# Patient Record
Sex: Female | Born: 1935
Health system: Southern US, Community
[De-identification: ages and names within clinical notes are randomized; demographics above are authoritative.]

## PROBLEM LIST (undated history)

## (undated) ENCOUNTER — Emergency Department (HOSPITAL_COMMUNITY): Discharge status: Absent without leave | Payer: Medicare HMO

## (undated) DIAGNOSIS — I2729 Other secondary pulmonary hypertension: Secondary | ICD-10-CM

## (undated) DIAGNOSIS — R1310 Dysphagia, unspecified: Secondary | ICD-10-CM

## (undated) DIAGNOSIS — I4891 Unspecified atrial fibrillation: Secondary | ICD-10-CM

## (undated) DIAGNOSIS — F329 Major depressive disorder, single episode, unspecified: Secondary | ICD-10-CM

## (undated) DIAGNOSIS — I2699 Other pulmonary embolism without acute cor pulmonale: Secondary | ICD-10-CM

## (undated) DIAGNOSIS — K228 Other specified diseases of esophagus: Secondary | ICD-10-CM

## (undated) DIAGNOSIS — Z9889 Other specified postprocedural states: Secondary | ICD-10-CM

## (undated) DIAGNOSIS — I4819 Other persistent atrial fibrillation: Secondary | ICD-10-CM

## (undated) DIAGNOSIS — G43909 Migraine, unspecified, not intractable, without status migrainosus: Secondary | ICD-10-CM

## (undated) DIAGNOSIS — I639 Cerebral infarction, unspecified: Secondary | ICD-10-CM

## (undated) DIAGNOSIS — I5033 Acute on chronic diastolic (congestive) heart failure: Secondary | ICD-10-CM

## (undated) DIAGNOSIS — N183 Chronic kidney disease, stage 3 (moderate): Secondary | ICD-10-CM

## (undated) DIAGNOSIS — F419 Anxiety disorder, unspecified: Secondary | ICD-10-CM

## (undated) DIAGNOSIS — Z5181 Encounter for therapeutic drug level monitoring: Secondary | ICD-10-CM

## (undated) DIAGNOSIS — E785 Hyperlipidemia, unspecified: Secondary | ICD-10-CM

## (undated) DIAGNOSIS — G40909 Epilepsy, unspecified, not intractable, without status epilepticus: Secondary | ICD-10-CM

## (undated) DIAGNOSIS — Z950 Presence of cardiac pacemaker: Secondary | ICD-10-CM

## (undated) DIAGNOSIS — Z8719 Personal history of other diseases of the digestive system: Secondary | ICD-10-CM

## (undated) DIAGNOSIS — R112 Nausea with vomiting, unspecified: Secondary | ICD-10-CM

## (undated) DIAGNOSIS — R011 Cardiac murmur, unspecified: Secondary | ICD-10-CM

## (undated) DIAGNOSIS — R911 Solitary pulmonary nodule: Secondary | ICD-10-CM

## (undated) DIAGNOSIS — J439 Emphysema, unspecified: Secondary | ICD-10-CM

## (undated) DIAGNOSIS — K219 Gastro-esophageal reflux disease without esophagitis: Secondary | ICD-10-CM

## (undated) DIAGNOSIS — I10 Essential (primary) hypertension: Secondary | ICD-10-CM

## (undated) DIAGNOSIS — F32A Depression, unspecified: Secondary | ICD-10-CM

## (undated) DIAGNOSIS — I482 Chronic atrial fibrillation, unspecified: Secondary | ICD-10-CM

## (undated) DIAGNOSIS — Z7901 Long term (current) use of anticoagulants: Secondary | ICD-10-CM

## (undated) DIAGNOSIS — G40409 Other generalized epilepsy and epileptic syndromes, not intractable, without status epilepticus: Secondary | ICD-10-CM

## (undated) HISTORY — DX: Emphysema, unspecified: J43.9

## (undated) HISTORY — DX: Other secondary pulmonary hypertension: I27.29

## (undated) HISTORY — DX: Essential (primary) hypertension: I10

## (undated) HISTORY — DX: Other persistent atrial fibrillation: I48.19

## (undated) HISTORY — DX: Cerebral infarction, unspecified: I63.9

## (undated) HISTORY — DX: Chronic atrial fibrillation, unspecified: I48.20

## (undated) HISTORY — DX: Solitary pulmonary nodule: R91.1

## (undated) HISTORY — DX: Epilepsy, unspecified, not intractable, without status epilepticus: G40.909

## (undated) HISTORY — DX: Major depressive disorder, single episode, unspecified: F32.9

## (undated) HISTORY — DX: Acute on chronic diastolic (congestive) heart failure: I50.33

## (undated) HISTORY — DX: Long term (current) use of anticoagulants: Z79.01

## (undated) HISTORY — DX: Anxiety disorder, unspecified: F41.9

## (undated) HISTORY — DX: Encounter for therapeutic drug level monitoring: Z51.81

## (undated) HISTORY — PX: ABDOMINAL HYSTERECTOMY: SHX81

## (undated) HISTORY — DX: Depression, unspecified: F32.A

## (undated) HISTORY — DX: Cardiac murmur, unspecified: R01.1

## (undated) HISTORY — PX: AUGMENTATION MAMMAPLASTY: SUR837

## (undated) HISTORY — PX: TUMOR EXCISION: SHX421

## (undated) HISTORY — DX: Hyperlipidemia, unspecified: E78.5

## (undated) HISTORY — DX: Presence of cardiac pacemaker: Z95.0

---

## 2010-08-08 ENCOUNTER — Ambulatory Visit: Payer: Self-pay | Admitting: Cardiology

## 2011-05-13 DIAGNOSIS — R072 Precordial pain: Secondary | ICD-10-CM

## 2011-09-03 ENCOUNTER — Encounter (INDEPENDENT_AMBULATORY_CARE_PROVIDER_SITE_OTHER): Payer: Self-pay | Admitting: *Deleted

## 2011-09-04 ENCOUNTER — Encounter (INDEPENDENT_AMBULATORY_CARE_PROVIDER_SITE_OTHER): Payer: Self-pay | Admitting: *Deleted

## 2011-09-22 ENCOUNTER — Encounter (INDEPENDENT_AMBULATORY_CARE_PROVIDER_SITE_OTHER): Payer: Self-pay | Admitting: Internal Medicine

## 2011-09-22 ENCOUNTER — Ambulatory Visit (INDEPENDENT_AMBULATORY_CARE_PROVIDER_SITE_OTHER): Payer: Medicare PPO | Admitting: Internal Medicine

## 2011-09-22 DIAGNOSIS — R569 Unspecified convulsions: Secondary | ICD-10-CM | POA: Insufficient documentation

## 2011-09-22 DIAGNOSIS — R109 Unspecified abdominal pain: Secondary | ICD-10-CM

## 2011-09-22 DIAGNOSIS — I10 Essential (primary) hypertension: Secondary | ICD-10-CM

## 2011-09-22 NOTE — Progress Notes (Signed)
Subjective:     Patient ID: Karen Richardson, female   DOB: [birth year], 77 y.o.   MRN: [id number]  HPI Karen Richardson is a 77 yr old female referred to our office by Dr. Sherril Croon for abdominal pain. She saw Dr. Sherril Croon 4-6 month ago for  upper abdominal pain. It felt like a knot.  She could not rest or sit up without hurting.  She was started on omeprazole which did not help.  She says they prayed at church for her and the pain resolved x 2 month.  She underwent a CT abdomen and pelvis in September  Which revealed rt upper quadrant inflammatory process most likely due to duodenitis/peptic ulcer disease. Appetite is good. No weight loss.  No abdominal pain. She has one a day. No melena or rectal bleeding.  Colonoscopy over 20 yrs ago which was incomplete Dr. Cleotis Nipper.  Patient has refused a colonoscopy today.  9/20/12012 CT abdomen and pelvis with CM for abdominal pain revealed Rt upper quadrant inflammatory process most likely due to duodenitis/peptic ulcer disease. 08/09/2011 US abdomen complete: Negative abdominal US  08/12/11 Glucose 111, BU\N 15, Creatinine 1.09, Total protein 6.9, ALP 78, AST 38, Total protein 0.4, ALT 30, K 4.6 NA 139, Amylase 40, Lipase 40.  H and H 13.1 and 36.4, MCV 92.5  Review of Systems  See hpi Current Outpatient Prescriptions  Medication Sig Dispense Refill  . carbamazepine (TEGRETOL XR) 200 MG 12 hr tablet Take 200 mg by mouth 2 (two) times daily.        . metoprolol (TOPROL-XL) 50 MG 24 hr tablet Take 50 mg by mouth daily.         Past Surgical History  Procedure Date  . Breast enhancement surgery     40 yrs ago  . Partial hysterectomy   . 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 Mother Deceased     Tumor in colon.  PE  . Father Deceased     CVA, COPD  . Sister Other     One had pancreatic cancer, one had a Brain tumor, one had epilepsy, ,   . Brother Deceased     One deceased from [facility], One drank heavy and is deceased.  .  Child Alive     all in good health   History   Social History Narrative  . No narrative on file   History   Social History  . Marital Status: Married    Spouse Name: N/A    Number of Children: N/A  . Years of Education: N/A   Occupational History  . Not on file.   Social History Main Topics  . Smoking status: Never Smoker   . Smokeless tobacco: Not on file  . Alcohol Use: No  . Drug Use: No  . Sexually Active: Not on file   Other Topics Concern  . Not on file   Social History Narrative  . No narrative on file   Allergies  Allergen Reactions  . Codeine         Objective:   Physical Exam  Filed Vitals:   09/22/11 1015  BP: 190/84  Pulse: 66  Temp: 97.8 F (36.6 C)  Height: 5' 5.5" (1.664 m)  Weight: 164 lb 11.2 oz (74.707 kg)    Alert and oriented. Skin warm and dry. Oral mucosa is moist. Natural teeth and partial plate in good condition. Sclera anicteric, conjunctivae is  pink. Thyroid not enlarged. No cervical lymphadenopathy. Lungs clear. Heart regular rate and rhythm.  Abdomen is soft. Bowel sounds are positive. No hepatomegaly. No abdominal masses felt. No tenderness.  No edema to lower extremities. Patient is alert and oriented.      Assessment:    Abnormal CT.  Upper abdominal pain resolved at this time.    Plan:       I discussed this case with Dr. Karilyn Cota. Patient is asymptomatic.  Will get an H. Pylori on her today. If her pain reoccurs he will proceed with an EGD.  Patient is satisfied with here care she received today.

## 2011-09-22 NOTE — Patient Instructions (Signed)
H. Pylori today.  If pain return Dr. Karilyn Cota will proceed with an EGD. I discussed this case with Dr. Karilyn Cota.

## 2011-09-23 LAB — H. PYLORI ANTIBODY, IGG: H Pylori IgG: 5.23 {ISR} — ABNORMAL HIGH

## 2011-09-24 ENCOUNTER — Telehealth (INDEPENDENT_AMBULATORY_CARE_PROVIDER_SITE_OTHER): Payer: Self-pay | Admitting: Internal Medicine

## 2011-09-24 NOTE — Telephone Encounter (Signed)
Rx for Prilosec, Amoxicillin and Clarithromycin x 14 days called to [facility].

## 2012-05-06 ENCOUNTER — Encounter (INDEPENDENT_AMBULATORY_CARE_PROVIDER_SITE_OTHER): Payer: Self-pay

## 2016-01-09 ENCOUNTER — Encounter: Payer: Self-pay | Admitting: Orthopaedic Surgery

## 2016-01-09 ENCOUNTER — Ambulatory Visit (INDEPENDENT_AMBULATORY_CARE_PROVIDER_SITE_OTHER): Payer: Medicare Other | Admitting: Orthopaedic Surgery

## 2016-01-09 VITALS — BP 173/86 | HR 58 | Temp 97.5°F | Ht 65.0 in | Wt 167.2 lb

## 2016-01-09 DIAGNOSIS — M25562 Pain in left knee: Secondary | ICD-10-CM | POA: Diagnosis not present

## 2016-01-09 DIAGNOSIS — M25561 Pain in right knee: Secondary | ICD-10-CM | POA: Diagnosis not present

## 2016-01-11 DIAGNOSIS — M25562 Pain in left knee: Principal | ICD-10-CM

## 2016-01-11 DIAGNOSIS — M25561 Pain in right knee: Secondary | ICD-10-CM | POA: Insufficient documentation

## 2016-01-11 NOTE — Patient Instructions (Signed)
Continue Naprosyn Call if any problems.

## 2016-01-11 NOTE — Progress Notes (Signed)
Patient Karen Richardson, female DOB:02/08/1934, 82 y.o. TVN:[id number]  Chief Complaint  Patient presents with  . Knee Pain    bilateral knee pain     HPI  Karen Richardson is a 82 y.o. female  With bilateral knee pains, more on the left than the right.  She has swelling and popppng but no giving way.  Knee Pain  The pain is present in the left knee and right knee. The quality of the pain is described as aching. The pain is at a severity of 3/10. The pain is mild. The pain has been fluctuating since onset. The symptoms are aggravated by weight bearing. She has tried acetaminophen, heat, ice, NSAIDs and rest for the symptoms. The treatment provided moderate relief.    Body mass index is 27.82 kg/(m^2).  Review of Systems  Patient does not have Diabetes Mellitus. Patient has hypertension. Patient does not have COPD or shortness of breath. Patient does not have BMI > 35. Patient does not have current smoking history.  Review of Systems  Cardiovascular:       Hypertension  Gastrointestinal:       GERD  Musculoskeletal: Positive for joint swelling.  Neurological:       History of CVA no residual     Past Medical History  Diagnosis Date  . Seizures (HCC)   . Hypertension     x 4 months    Past Surgical History  Procedure Laterality Date  . Breast enhancement surgery      40 yrs ago  . Partial hysterectomy    . Tumor removed from spinal cord      benign    History reviewed. No pertinent family history.  Social History Social History  Substance Use Topics  . Smoking status: Never Smoker   . Smokeless tobacco: None  . Alcohol Use: No    Allergies  Allergen Reactions  . Codeine     Current Outpatient Prescriptions  Medication Sig Dispense Refill  . amLODipine (NORVASC) 5 MG tablet Take 5 mg by mouth daily.    Marland Kitchen atorvastatin (LIPITOR) 10 MG tablet Take 10 mg by mouth daily.    . carbamazepine (TEGRETOL XR) 200 MG 12 hr tablet Take 200 mg by mouth 2 (two) times  daily.      . metoprolol (TOPROL-XL) 50 MG 24 hr tablet Take 50 mg by mouth daily.      . naproxen (NAPROSYN) 500 MG tablet Take 500 mg by mouth 2 (two) times daily with a meal.     No current facility-administered medications for this visit.     Physical Exam  Blood pressure 173/86, pulse 58, temperature 97.5 F (36.4 C), height 5\' 5"  (1.651 m), weight 167 lb 3.2 oz (75.841 kg).  Constitutional: overall normal hygiene, normal nutrition, well developed, normal grooming, normal body habitus. Assistive device:none  Musculoskeletal: gait and station Limp left, muscle tone and strength are normal, no tremors or atrophy is present.  .  Neurological: coordination overall normal.  Deep tendon reflex/nerve stretch intact.  Sensation normal.  Cranial nerves II-XII intact.   Skin:normal overall no scars, lesions, ulcers or rash es. No psoriasis.  Psychiatric: Alert and oriented x 3.  Recent memory intact, remote memory unclear.  Normal mood and affect. Well groomed.  Good eye contact.  Cardiovascular: overall no swelling, no varicosities, no edema bilaterally, normal temperatures of the legs and arms, no clubbing, cyanosis and good capillary refill.  Lymphatic: palpation is normal.   Extremities:Both knees are  tender and she has bilateral effusions of the knees, much more on the left.  Both knees are stable Inspection Her thighs and feet are normal.  Knees with effusion Strength and tone normal Range of motion 0 to 100 on left with crepitus, 0 to 105 on the right.  Additional services performed: I reviewed her exercises and activity levels.  She is encouraged to be as active as she can be.   Continue her Naprosyn  PLAN Call if any problems.  Precautions discussed.  Continue current medications.   Return to clinic 4-6 wks

## 2016-02-06 ENCOUNTER — Ambulatory Visit (INDEPENDENT_AMBULATORY_CARE_PROVIDER_SITE_OTHER): Payer: Medicare Other | Admitting: Orthopaedic Surgery

## 2016-02-06 VITALS — BP 170/88 | HR 63 | Temp 97.7°F | Ht 65.0 in | Wt 167.2 lb

## 2016-02-06 DIAGNOSIS — M25561 Pain in right knee: Secondary | ICD-10-CM

## 2016-02-06 DIAGNOSIS — M25562 Pain in left knee: Secondary | ICD-10-CM | POA: Diagnosis not present

## 2016-02-06 NOTE — Progress Notes (Signed)
Patient Karen Richardson, female DOB:July 01, 1934, 82 y.o. IPJ:[id number]  Chief Complaint  Patient presents with  . Follow-up    Bilateral knee pain s/p injection "feel better since shot"    HPI  Karen Richardson is a 82 y.o. female who has had bilateral knee pain.  I did injections last time and she is significantly improved.  She has no pain now.  She has been walker and has no new problem.  She has no giving way, no locking  She has little swelling now.  She still has some popping of the knees.  She is very pleased she is better.  HPI  Body mass index is 27.82 kg/(m^2).   Review of Systems  HENT: Negative.   Eyes: Negative.   Cardiovascular:       Hypertension  Gastrointestinal:       GERD  Musculoskeletal: Positive for joint swelling.  Allergic/Immunologic: Negative.   Neurological: Negative.        History of CVA no residual   Hematological: Negative.   Psychiatric/Behavioral: Negative.     Past Medical History  Diagnosis Date  . Seizures (HCC)   . Hypertension     x 4 months    Past Surgical History  Procedure Laterality Date  . Breast enhancement surgery      40 yrs ago  . Partial hysterectomy    . Tumor removed from spinal cord      benign    No family history on file.  Social History Social History  Substance Use Topics  . Smoking status: Never Smoker   . Smokeless tobacco: Not on file  . Alcohol Use: No    Allergies  Allergen Reactions  . Codeine     Current Outpatient Prescriptions  Medication Sig Dispense Refill  . amLODipine (NORVASC) 5 MG tablet Take 5 mg by mouth daily.    Marland Kitchen atorvastatin (LIPITOR) 10 MG tablet Take 10 mg by mouth daily.    . benazepril (LOTENSIN) 20 MG tablet   3  . carbamazepine (TEGRETOL) 200 MG tablet   11  . meclizine (ANTIVERT) 25 MG tablet   0  . metoprolol (TOPROL-XL) 50 MG 24 hr tablet Take 50 mg by mouth daily.      . naproxen (NAPROSYN) 500 MG tablet Take 500 mg by mouth 2 (two) times daily with a meal.    .  pantoprazole (PROTONIX) 40 MG tablet   4   No current facility-administered medications for this visit.     Physical Exam  Blood pressure 170/88, pulse 63, temperature 97.7 F (36.5 C), height 5\' 5"  (1.651 m), weight 167 lb 3.2 oz (75.841 kg).  Constitutional: overall normal hygiene, normal nutrition, well developed, normal grooming, normal body habitus. Assistive device:none  Musculoskeletal: gait and station Limp none, muscle tone and strength are normal, no tremors or atrophy is present.  .  Neurological: coordination overall normal.  Deep tendon reflex/nerve stretch intact.  Sensation normal.  Cranial nerves II-XII intact.   Skin:   normal overall no scars, lesions, ulcers or rashes. No psoriasis.  Psychiatric: Alert and oriented x 3.  Recent memory intact, remote memory unclear.  Normal mood and affect. Well groomed.  Good eye contact.  Cardiovascular: overall no swelling, no varicosities, no edema bilaterally, normal temperatures of the legs and arms, no clubbing, cyanosis and good capillary refill.  Lymphatic: palpation is normal.   Extremities she has no pain to the knees bilaterally Inspection normal both knees Strength and tone normal Range  of motion 0 to 115 bilaterally with slight crepitus  The patient has been educated about the nature of the problem(s) and counseled on treatment options.  The patient appeared to understand what I have discussed and is in agreement with it.  PLAN Call if any problems.  Precautions discussed.  Continue current medications.   Return to clinic PRN

## 2016-12-01 ENCOUNTER — Inpatient Hospital Stay (HOSPITAL_COMMUNITY)
Admission: AD | Admit: 2016-12-01 | Discharge: 2016-12-14 | DRG: 175 | Disposition: A | Discharge status: Home Health | Payer: Medicare Other | Source: Other Acute Inpatient Hospital | Attending: Internal Medicine | Admitting: Internal Medicine

## 2016-12-01 ENCOUNTER — Inpatient Hospital Stay (HOSPITAL_COMMUNITY): Payer: Medicare Other

## 2016-12-01 ENCOUNTER — Encounter (HOSPITAL_COMMUNITY): Payer: Self-pay | Admitting: Family Medicine

## 2016-12-01 DIAGNOSIS — I340 Nonrheumatic mitral (valve) insufficiency: Secondary | ICD-10-CM | POA: Diagnosis present

## 2016-12-01 DIAGNOSIS — K219 Gastro-esophageal reflux disease without esophagitis: Secondary | ICD-10-CM | POA: Diagnosis present

## 2016-12-01 DIAGNOSIS — I2699 Other pulmonary embolism without acute cor pulmonale: Secondary | ICD-10-CM | POA: Diagnosis present

## 2016-12-01 DIAGNOSIS — E876 Hypokalemia: Secondary | ICD-10-CM | POA: Diagnosis not present

## 2016-12-01 DIAGNOSIS — G40909 Epilepsy, unspecified, not intractable, without status epilepticus: Secondary | ICD-10-CM | POA: Diagnosis present

## 2016-12-01 DIAGNOSIS — R451 Restlessness and agitation: Secondary | ICD-10-CM | POA: Diagnosis not present

## 2016-12-01 DIAGNOSIS — I071 Rheumatic tricuspid insufficiency: Secondary | ICD-10-CM | POA: Diagnosis present

## 2016-12-01 DIAGNOSIS — I272 Pulmonary hypertension, unspecified: Secondary | ICD-10-CM | POA: Diagnosis present

## 2016-12-01 DIAGNOSIS — N183 Chronic kidney disease, stage 3 unspecified: Secondary | ICD-10-CM | POA: Diagnosis present

## 2016-12-01 DIAGNOSIS — Z79899 Other long term (current) drug therapy: Secondary | ICD-10-CM | POA: Diagnosis not present

## 2016-12-01 DIAGNOSIS — E860 Dehydration: Secondary | ICD-10-CM | POA: Diagnosis not present

## 2016-12-01 DIAGNOSIS — R109 Unspecified abdominal pain: Secondary | ICD-10-CM

## 2016-12-01 DIAGNOSIS — R569 Unspecified convulsions: Secondary | ICD-10-CM

## 2016-12-01 DIAGNOSIS — R34 Anuria and oliguria: Secondary | ICD-10-CM | POA: Diagnosis not present

## 2016-12-01 DIAGNOSIS — I129 Hypertensive chronic kidney disease with stage 1 through stage 4 chronic kidney disease, or unspecified chronic kidney disease: Secondary | ICD-10-CM | POA: Diagnosis present

## 2016-12-01 DIAGNOSIS — I2609 Other pulmonary embolism with acute cor pulmonale: Secondary | ICD-10-CM

## 2016-12-01 DIAGNOSIS — Z885 Allergy status to narcotic agent status: Secondary | ICD-10-CM

## 2016-12-01 DIAGNOSIS — R0603 Acute respiratory distress: Secondary | ICD-10-CM

## 2016-12-01 DIAGNOSIS — Z8719 Personal history of other diseases of the digestive system: Secondary | ICD-10-CM | POA: Diagnosis not present

## 2016-12-01 DIAGNOSIS — J189 Pneumonia, unspecified organism: Secondary | ICD-10-CM | POA: Diagnosis present

## 2016-12-01 DIAGNOSIS — K2289 Other specified disease of esophagus: Secondary | ICD-10-CM

## 2016-12-01 DIAGNOSIS — G9340 Encephalopathy, unspecified: Secondary | ICD-10-CM | POA: Diagnosis not present

## 2016-12-01 DIAGNOSIS — I4891 Unspecified atrial fibrillation: Secondary | ICD-10-CM | POA: Diagnosis present

## 2016-12-01 DIAGNOSIS — Z7901 Long term (current) use of anticoagulants: Secondary | ICD-10-CM | POA: Diagnosis not present

## 2016-12-01 DIAGNOSIS — I482 Chronic atrial fibrillation, unspecified: Secondary | ICD-10-CM | POA: Diagnosis present

## 2016-12-01 DIAGNOSIS — N179 Acute kidney failure, unspecified: Secondary | ICD-10-CM | POA: Diagnosis present

## 2016-12-01 DIAGNOSIS — K228 Other specified diseases of esophagus: Secondary | ICD-10-CM

## 2016-12-01 DIAGNOSIS — F419 Anxiety disorder, unspecified: Secondary | ICD-10-CM | POA: Diagnosis not present

## 2016-12-01 DIAGNOSIS — R1310 Dysphagia, unspecified: Secondary | ICD-10-CM | POA: Diagnosis present

## 2016-12-01 DIAGNOSIS — T502X5A Adverse effect of carbonic-anhydrase inhibitors, benzothiadiazides and other diuretics, initial encounter: Secondary | ICD-10-CM | POA: Diagnosis present

## 2016-12-01 DIAGNOSIS — E8770 Fluid overload, unspecified: Secondary | ICD-10-CM | POA: Diagnosis not present

## 2016-12-01 DIAGNOSIS — R262 Difficulty in walking, not elsewhere classified: Secondary | ICD-10-CM

## 2016-12-01 DIAGNOSIS — I10 Essential (primary) hypertension: Secondary | ICD-10-CM | POA: Diagnosis not present

## 2016-12-01 DIAGNOSIS — R200 Anesthesia of skin: Secondary | ICD-10-CM | POA: Diagnosis not present

## 2016-12-01 HISTORY — DX: Chronic kidney disease, stage 3 (moderate): N18.3

## 2016-12-01 HISTORY — DX: Dysphagia, unspecified: R13.10

## 2016-12-01 HISTORY — DX: Gastro-esophageal reflux disease without esophagitis: K21.9

## 2016-12-01 HISTORY — DX: Other specified diseases of esophagus: K22.8

## 2016-12-01 HISTORY — DX: Other pulmonary embolism without acute cor pulmonale: I26.99

## 2016-12-01 HISTORY — DX: Unspecified atrial fibrillation: I48.91

## 2016-12-01 HISTORY — DX: Personal history of other diseases of the digestive system: Z87.19

## 2016-12-01 MED ORDER — ONDANSETRON HCL 4 MG/2ML IJ SOLN
4.0000 mg | Freq: Four times a day (QID) | INTRAMUSCULAR | Status: DC | PRN
  Administered 2016-12-05 – 2016-12-09 (×8): 4 mg via INTRAVENOUS
  Filled 2016-12-01 (×8): qty 2

## 2016-12-01 MED ORDER — METOPROLOL SUCCINATE ER 25 MG PO TB24
25.0000 mg | ORAL_TABLET | Freq: Two times a day (BID) | ORAL | Status: DC
  Administered 2016-12-01 – 2016-12-03 (×4): 25 mg via ORAL
  Filled 2016-12-01 (×4): qty 1

## 2016-12-01 MED ORDER — AMIODARONE HCL IN DEXTROSE 360-4.14 MG/200ML-% IV SOLN
60.0000 mg/h | INTRAVENOUS | Status: AC
  Administered 2016-12-01: 60 mg/h via INTRAVENOUS
  Filled 2016-12-01: qty 200

## 2016-12-01 MED ORDER — PANTOPRAZOLE SODIUM 40 MG PO TBEC
40.0000 mg | DELAYED_RELEASE_TABLET | Freq: Every day | ORAL | Status: DC
  Administered 2016-12-02 – 2016-12-14 (×13): 40 mg via ORAL
  Filled 2016-12-01 (×13): qty 1

## 2016-12-01 MED ORDER — MECLIZINE HCL 25 MG PO TABS
25.0000 mg | ORAL_TABLET | Freq: Three times a day (TID) | ORAL | Status: DC | PRN
  Administered 2016-12-05: 25 mg via ORAL
  Filled 2016-12-01 (×2): qty 1

## 2016-12-01 MED ORDER — SODIUM CHLORIDE 0.9 % IV SOLN: 250.0000 mL | INTRAVENOUS | Status: DC | PRN

## 2016-12-01 MED ORDER — ATORVASTATIN CALCIUM 10 MG PO TABS
10.0000 mg | ORAL_TABLET | Freq: Every day | ORAL | Status: DC
  Administered 2016-12-02 – 2016-12-14 (×13): 10 mg via ORAL
  Filled 2016-12-01 (×13): qty 1

## 2016-12-01 MED ORDER — ACETAMINOPHEN 325 MG PO TABS
650.0000 mg | ORAL_TABLET | ORAL | Status: DC | PRN
  Administered 2016-12-05: 650 mg via ORAL
  Filled 2016-12-01: qty 2

## 2016-12-01 MED ORDER — ALPRAZOLAM 0.25 MG PO TABS
0.2500 mg | ORAL_TABLET | Freq: Two times a day (BID) | ORAL | Status: DC | PRN
  Administered 2016-12-02 – 2016-12-05 (×6): 0.25 mg via ORAL
  Filled 2016-12-01 (×6): qty 1

## 2016-12-01 MED ORDER — SODIUM CHLORIDE 0.9% FLUSH: 3.0000 mL | INTRAVENOUS | Status: DC | PRN

## 2016-12-01 MED ORDER — DILTIAZEM HCL 60 MG PO TABS
90.0000 mg | ORAL_TABLET | Freq: Two times a day (BID) | ORAL | Status: DC
  Administered 2016-12-01 – 2016-12-05 (×8): 90 mg via ORAL
  Filled 2016-12-01 (×8): qty 1

## 2016-12-01 MED ORDER — SODIUM CHLORIDE 0.9% FLUSH
3.0000 mL | Freq: Two times a day (BID) | INTRAVENOUS | Status: DC
  Administered 2016-12-02 – 2016-12-12 (×16): 3 mL via INTRAVENOUS

## 2016-12-01 MED ORDER — CARBAMAZEPINE 200 MG PO TABS
200.0000 mg | ORAL_TABLET | Freq: Three times a day (TID) | ORAL | Status: DC
  Administered 2016-12-01 – 2016-12-14 (×38): 200 mg via ORAL
  Filled 2016-12-01 (×38): qty 1

## 2016-12-01 MED ORDER — AMIODARONE LOAD VIA INFUSION
150.0000 mg | Freq: Once | INTRAVENOUS | Status: AC
  Administered 2016-12-01: 150 mg via INTRAVENOUS
  Filled 2016-12-01: qty 83.34

## 2016-12-01 MED ORDER — HEPARIN (PORCINE) IN NACL 100-0.45 UNIT/ML-% IJ SOLN
1000.0000 [IU]/h | INTRAMUSCULAR | Status: DC
  Administered 2016-12-01: 1000 [IU]/h via INTRAVENOUS
  Filled 2016-12-01: qty 250

## 2016-12-01 MED ORDER — APIXABAN 5 MG PO TABS: 10.0000 mg | ORAL_TABLET | Freq: Two times a day (BID) | ORAL | Status: DC

## 2016-12-01 MED ORDER — APIXABAN 5 MG PO TABS
5.0000 mg | ORAL_TABLET | Freq: Two times a day (BID) | ORAL | Status: DC
Start: 2016-12-07 — End: 2016-12-01

## 2016-12-01 MED ORDER — AMIODARONE HCL IN DEXTROSE 360-4.14 MG/200ML-% IV SOLN
30.0000 mg/h | INTRAVENOUS | Status: DC
  Administered 2016-12-02 – 2016-12-03 (×4): 30 mg/h via INTRAVENOUS
  Filled 2016-12-01 (×4): qty 200

## 2016-12-01 MED ORDER — ZOLPIDEM TARTRATE 5 MG PO TABS
5.0000 mg | ORAL_TABLET | Freq: Every evening | ORAL | Status: DC | PRN
  Administered 2016-12-02: 5 mg via ORAL
  Filled 2016-12-01: qty 1

## 2016-12-01 NOTE — H&P (Signed)
History and Physical    [name]:[id number] DOB: 1934-06-01 DOA: 12/01/2016  PCP: Ignatius Specking, MD   Patient coming from: [facility]   Chief Complaint: A fib RVR, acute PE with heart strain  HPI: Karen Richardson is a 83 y.o. female with medical history significant for hypertension and seizure disorder who presents in transfer from [facility] for management of atrial fibrillation with RVR. Patient initially presented with cough and congestion and had also been experiencing intermittent palpitations associated with breathlessness for the preceding month. She had been afebrile, denied chest pain, denied recent long distance travel or sick contacts, and did not have any personal or family history of VTE.   Outside Hospital Course: On presentation to [facility] on 11/27/2016, there was a possibility of pneumonia raised by chest x-ray and she was admitted for further evaluation and management of this. Patient had been afebrile and without leukocytosis and was noted to be significantly tachycardic. EKG demonstrated a new onset atrial fibrillation with RVR and she was treated with IV Cardizem. She was also treated with Rocephin and azithromycin. She has since completed 5 days of treatment. D-dimer was obtained, noted to be elevated, and CT PE study demonstrated right lower and middle lobe pulmonary emboli with CT evidence for heart strain. Patient was already on Eliquis 5 mg twice a day and this was increased to 10 mg twice a day. Patient continued to have rapid rates intermittently in the hospital and she was treated with increasing doses of Cardizem, now up to 180 mg daily. She was also maintained on Toprol-XL 25 mg twice a day. With intermittent RVR persisting and causing significant symptoms for the patient, a transfer for admission to [facility] was requested. Cardiologist to [facility] was consulted and accepted the patient in consultation for  initiation of IV amiodarone. She will be admitted to the cardiac telemetry unit for ongoing evaluation and management of acute PE with heart strain and atrial fibrillation with RVR, refractory to treatment with diltiazem.  Review of Systems:  All other systems reviewed and apart from HPI, are negative.  Past Medical History:  Diagnosis Date  . Hypertension    x 4 months  . Seizures (HCC)     Past Surgical History:  Procedure Laterality Date  . BREAST ENHANCEMENT SURGERY     40 yrs ago  . PARTIAL HYSTERECTOMY    . Tumor removed from spinal cord     benign     reports that she has never smoked. She does not have any smokeless tobacco history on file. She reports that she does not drink alcohol or use drugs.  Allergies  Allergen Reactions  . Codeine     History reviewed. No pertinent family history.   Prior to Admission medications   Medication Sig Start Date End Date Taking? Authorizing Provider  amLODipine (NORVASC) 5 MG tablet Take 5 mg by mouth daily.    Historical Provider, MD  atorvastatin (LIPITOR) 10 MG tablet Take 10 mg by mouth daily.    Historical Provider, MD  benazepril (LOTENSIN) 20 MG tablet  01/18/16   Historical Provider, MD  carbamazepine (TEGRETOL) 200 MG tablet  01/18/16   Historical Provider, MD  meclizine (ANTIVERT) 25 MG tablet  01/30/16   Historical Provider, MD  metoprolol (TOPROL-XL) 50 MG 24 hr tablet Take 50 mg by mouth daily.      Historical Provider, MD  naproxen (NAPROSYN) 500 MG tablet Take 500 mg by  mouth 2 (two) times daily with a meal.    Historical Provider, MD  pantoprazole (PROTONIX) 40 MG tablet  12/19/15   Historical Provider, MD    Physical Exam: Vitals:   12/01/16 2056  BP: (!) 159/142  Pulse: (!) 123  Resp: (!) 21  Temp: 97.9 F (36.6 C)  TempSrc: Oral  SpO2: 95%  Weight: 88.6 kg (195 lb 6.4 oz)  Height: 5\' 5"  (1.651 m)      Constitutional: NAD, calm, comfortable Eyes: PERTLA, lids and conjunctivae normal ENMT: Mucous  membranes are moist. Posterior pharynx clear of any exudate or lesions.   Neck: normal, supple, no masses, no thyromegaly Respiratory: no wheezing, no crackles. Normal respiratory effort. No accessory muscle use.  Cardiovascular: Rate ~120 and irregular. No extremity edema. No significant JVD. Abdomen: No distension, no tenderness, no masses palpated. Bowel sounds normal.  Musculoskeletal: no clubbing / cyanosis. No joint deformity upper and lower extremities. Normal muscle tone.  Skin: no significant rashes, lesions, ulcers. Warm, dry, well-perfused. Neurologic: CN 2-12 grossly intact. Sensation intact, DTR normal. Strength 5/5 in all 4 limbs.  Psychiatric: Normal judgment and insight. Alert and oriented x 3. Normal mood and affect.     Labs on Admission: I have personally reviewed following labs and imaging studies  CBC: No results for input(s): WBC, NEUTROABS, HGB, HCT, MCV, PLT in the last 168 hours. Basic Metabolic Panel: No results for input(s): NA, K, CL, CO2, GLUCOSE, BUN, CREATININE, CALCIUM, MG, PHOS in the last 168 hours. GFR: CrCl cannot be calculated (No order found.). Liver Function Tests: No results for input(s): AST, ALT, ALKPHOS, BILITOT, PROT, ALBUMIN in the last 168 hours. No results for input(s): LIPASE, AMYLASE in the last 168 hours. No results for input(s): AMMONIA in the last 168 hours. Coagulation Profile: No results for input(s): INR, PROTIME in the last 168 hours. Cardiac Enzymes: No results for input(s): CKTOTAL, CKMB, CKMBINDEX, TROPONINI in the last 168 hours. BNP (last 3 results) No results for input(s): PROBNP in the last 8760 hours. HbA1C: No results for input(s): HGBA1C in the last 72 hours. CBG: No results for input(s): GLUCAP in the last 168 hours. Lipid Profile: No results for input(s): CHOL, HDL, LDLCALC, TRIG, CHOLHDL, LDLDIRECT in the last 72 hours. Thyroid Function Tests: No results for input(s): TSH, T4TOTAL, FREET4, T3FREE, THYROIDAB in  the last 72 hours. Anemia Panel: No results for input(s): VITAMINB12, FOLATE, FERRITIN, TIBC, IRON, RETICCTPCT in the last 72 hours. Urine analysis: No results found for: COLORURINE, APPEARANCEUR, LABSPEC, PHURINE, GLUCOSEU, HGBUR, BILIRUBINUR, KETONESUR, PROTEINUR, UROBILINOGEN, NITRITE, LEUKOCYTESUR Sepsis Labs: @LABRCNTIP (procalcitonin:4,lacticidven:4) )No results found for this or any previous visit (from the past 240 hour(s)).   Radiological Exams on Admission: No results found.  EKG: Ordered and pending.   Assessment/Plan  1. Atrial fibrillation with RVR  - Pt presents in a fib RVR, already on Toprol and increasing doses of diltiazem  - Cardiology is consulting and much appreciated  - Likely precipitated by #2 below  - She was started on amiodarone infusion and is responding well - Continue diltiazem and up-titrate Toprol as needed per cardiology recommendations with hopes of transitioning off of amio - Eliquis changed to heparin infusion as pharmacist indicates Eliquis efficacy is reduced by patient's Tegretol   2. Acute PE with right-heart strain  - Pt was initially admitted to hospital on 12/29 with suspected PNA  - D-dimer was elevated and CTA featured RLL and RML PEs with CT-evidence for right heart strain  - She was  on Eliquis 5 mg BID and it was increased to 10 mg BID  - Eliquis was stopped and she was started on heparin infusion on admission here given reduced efficacy of Eliquis in patient's on Tegretol    3. Hypertension  - Continue Toprol and diltiazem as above   4. ?CAP - Pt was admitted to [facility] on 12/29 with suspected CAP and received 5 days treatment with Rocephin and azithro prior to transfer here  - Not clear that she had PNA as there was no leukocytosis or fever and dyspnea/cough explained by PE  - Will check CXR, obtain cultures if febrile    5. Seizure disorder  - Continue Tegretol at current dosing   6. GERD - Continue current management with  daily Protonix     DVT prophylaxis: Eliquis Code Status: Full  Family Communication: Discussed with patient Disposition Plan: Admit to telemetry Consults called: Cardiology Admission status: Inpatient    Briscoe Deutscher, MD [facility] Pager [phone]  If 7PM-7AM, please contact night-coverage [email or web address] Password [email or web address]  12/01/2016, 10:40 PM

## 2016-12-01 NOTE — Progress Notes (Signed)
  Amiodarone Drug - Drug Interaction Consult Note  Recommendations: Monitor HR, keep on tele 2/2 amiodarone + diltiazem + Toprol.  Amiodarone is metabolized by the cytochrome P450 system and therefore has the potential to cause many drug interactions. Amiodarone has an average plasma half-life of 50 days (range 20 to 100 days).   There is potential for drug interactions to occur several weeks or months after stopping treatment and the onset of drug interactions may be slow after initiating amiodarone.   [x]  Statins (on Lipitor): Increased risk of myopathy. Simvastatin- restrict dose to 20mg  daily. Other statins: counsel patients to report any muscle pain or weakness immediately.  [x]  Anticoagulants (on Eliquis): Amiodarone can increase anticoagulant effect. Consider warfarin dose reduction. Patients should be monitored closely and the dose of anticoagulant altered accordingly, remembering that amiodarone levels take several weeks to stabilize.  []  Antiepileptics: Amiodarone can increase plasma concentration of phenytoin, the dose should be reduced. Note that small changes in phenytoin dose can result in large changes in levels. Monitor patient and counsel on signs of toxicity.  [x]  Beta blockers (on Toprol): increased risk of bradycardia, AV block and myocardial depression. Sotalol - avoid concomitant use.  [x]   Calcium channel blockers (diltiazem and verapamil) (on diltiazem): increased risk of bradycardia, AV block and myocardial depression.  []   Cyclosporine: Amiodarone increases levels of cyclosporine. Reduced dose of cyclosporine is recommended.  []  Digoxin dose should be halved when amiodarone is started.  []  Diuretics: increased risk of cardiotoxicity if hypokalemia occurs.  []  Oral hypoglycemic agents (glyburide, glipizide, glimepiride): increased risk of hypoglycemia. Patient's glucose levels should be monitored closely when initiating amiodarone therapy.   []  Drugs that prolong  the QT interval:  Torsades de pointes risk may be increased with concurrent use - avoid if possible.  Monitor QTc, also keep magnesium/potassium WNL if concurrent therapy can't be avoided. Marland Kitchen Antibiotics: e.g. fluoroquinolones, erythromycin. . Antiarrhythmics: e.g. quinidine, procainamide, disopyramide, sotalol. . Antipsychotics: e.g. phenothiazines, haloperidol.  . Lithium, tricyclic antidepressants, and methadone.  Thank You,  Vernard Gambles, PharmD, BCPS  12/01/2016 10:43 PM

## 2016-12-01 NOTE — Progress Notes (Signed)
ANTICOAGULATION CONSULT NOTE - Initial Consult  Pharmacy Consult for Heparin Indication: acute PE and afib  Allergies  Allergen Reactions  . Codeine     Patient Measurements: Height: 5\' 5"  (165.1 cm) Weight: 195 lb 6.4 oz (88.6 kg) IBW/kg (Calculated) : 57 Heparin Dosing Weight: 76 kg  Vital Signs: Temp: 97.9 F (36.6 C) (01/02 2056) Temp Source: Oral (01/02 2056) BP: 159/142 (01/02 2056) Pulse Rate: 123 (01/02 2056)  Labs: No results for input(s): HGB, HCT, PLT, APTT, LABPROT, INR, HEPARINUNFRC, HEPRLOWMOCWT, CREATININE, CKTOTAL, CKMB, TROPONINI in the last 72 hours.  CrCl cannot be calculated (No order found.).   Medical History: Past Medical History:  Diagnosis Date  . Hypertension    x 4 months  . Seizures (HCC)     Medications:  Awaiting home med rec  Assessment: 83 y.o. F transferred from [facility]. Eliquis 5mg  po BID started at [facility] for afib 12/30. CT showed acute PE so dose increased to 10mg  BID on 12/31 - last dose 1/2 a.m. Since pt on carbamazepine (can decrease apixaban concentration - not recommended to use together), Dr. Antionette Char will change to heparin per pharmacy. Apixaban will still be affecting heparin level so will utilize PTT for monitoring. Likely will need transition to coumadin.  Goal of Therapy:  Heparin level 0.3-0.7 units/ml; PTT 66-102 sec Monitor platelets by anticoagulation protocol: Yes   Plan:  Baseline PTT and heparin level Begin heparin gtt at 1000 units/hr Will f/u 8hr PTT Daily PTT, heparin level and CBC  Christoper Fabian, PharmD, BCPS Clinical pharmacist, pager [phone] 12/01/2016,11:13 PM

## 2016-12-02 ENCOUNTER — Encounter (HOSPITAL_COMMUNITY): Payer: Self-pay

## 2016-12-02 DIAGNOSIS — I10 Essential (primary) hypertension: Secondary | ICD-10-CM

## 2016-12-02 DIAGNOSIS — I4891 Unspecified atrial fibrillation: Secondary | ICD-10-CM

## 2016-12-02 LAB — COMPREHENSIVE METABOLIC PANEL
ALT: 57 U/L — ABNORMAL HIGH (ref 14–54)
ANION GAP: 11 (ref 5–15)
AST: 45 U/L — ABNORMAL HIGH (ref 15–41)
Albumin: 3.3 g/dL — ABNORMAL LOW (ref 3.5–5.0)
Alkaline Phosphatase: 83 U/L (ref 38–126)
BILIRUBIN TOTAL: 0.5 mg/dL (ref 0.3–1.2)
BUN: 10 mg/dL (ref 6–20)
CO2: 23 mmol/L (ref 22–32)
Calcium: 8.6 mg/dL — ABNORMAL LOW (ref 8.9–10.3)
Chloride: 107 mmol/L (ref 101–111)
Creatinine, Ser: 0.91 mg/dL (ref 0.44–1.00)
GFR calc non Af Amer: 58 mL/min — ABNORMAL LOW (ref >60)
GLUCOSE: 111 mg/dL — AB (ref 65–99)
POTASSIUM: 4.8 mmol/L (ref 3.5–5.1)
SODIUM: 141 mmol/L (ref 135–145)
TOTAL PROTEIN: 6.1 g/dL — AB (ref 6.5–8.1)

## 2016-12-02 LAB — BASIC METABOLIC PANEL
ANION GAP: 7 (ref 5–15)
BUN: 13 mg/dL (ref 6–20)
CHLORIDE: 108 mmol/L (ref 101–111)
CO2: 25 mmol/L (ref 22–32)
Calcium: 8.6 mg/dL — ABNORMAL LOW (ref 8.9–10.3)
Creatinine, Ser: 1.09 mg/dL — ABNORMAL HIGH (ref 0.44–1.00)
GFR calc non Af Amer: 47 mL/min — ABNORMAL LOW (ref >60)
GFR, EST AFRICAN AMERICAN: 54 mL/min — AB (ref >60)
Glucose, Bld: 106 mg/dL — ABNORMAL HIGH (ref 65–99)
POTASSIUM: 4.3 mmol/L (ref 3.5–5.1)
SODIUM: 140 mmol/L (ref 135–145)

## 2016-12-02 LAB — CBC WITH DIFFERENTIAL/PLATELET
BASOS ABS: 0 10*3/uL (ref 0.0–0.1)
Basophils Relative: 0 %
EOS ABS: 0.1 10*3/uL (ref 0.0–0.7)
Eosinophils Relative: 2 %
HCT: 35.5 % — ABNORMAL LOW (ref 36.0–46.0)
HEMOGLOBIN: 11.2 g/dL — AB (ref 12.0–15.0)
Lymphocytes Relative: 26 %
Lymphs Abs: 1.8 10*3/uL (ref 0.7–4.0)
MCH: 30.4 pg (ref 26.0–34.0)
MCHC: 31.5 g/dL (ref 30.0–36.0)
MCV: 96.2 fL (ref 78.0–100.0)
MONOS PCT: 10 %
Monocytes Absolute: 0.7 10*3/uL (ref 0.1–1.0)
NEUTROS ABS: 4.3 10*3/uL (ref 1.7–7.7)
NEUTROS PCT: 62 %
Platelets: 192 10*3/uL (ref 150–400)
RBC: 3.69 MIL/uL — AB (ref 3.87–5.11)
RDW: 15.4 % (ref 11.5–15.5)
WBC: 7 10*3/uL (ref 4.0–10.5)

## 2016-12-02 LAB — CBC
HEMATOCRIT: 35.3 % — AB (ref 36.0–46.0)
HEMOGLOBIN: 11.5 g/dL — AB (ref 12.0–15.0)
MCH: 30.9 pg (ref 26.0–34.0)
MCHC: 32.6 g/dL (ref 30.0–36.0)
MCV: 94.9 fL (ref 78.0–100.0)
Platelets: 173 10*3/uL (ref 150–400)
RBC: 3.72 MIL/uL — AB (ref 3.87–5.11)
RDW: 15.3 % (ref 11.5–15.5)
WBC: 7.2 10*3/uL (ref 4.0–10.5)

## 2016-12-02 LAB — MAGNESIUM: MAGNESIUM: 2.1 mg/dL (ref 1.7–2.4)

## 2016-12-02 LAB — APTT
APTT: 99 s — AB (ref 24–36)
APTT: 39 s — AB (ref 24–36)

## 2016-12-02 LAB — HEPARIN LEVEL (UNFRACTIONATED)

## 2016-12-02 LAB — BRAIN NATRIURETIC PEPTIDE: B Natriuretic Peptide: 681.3 pg/mL — ABNORMAL HIGH (ref 0.0–100.0)

## 2016-12-02 LAB — TROPONIN I: Troponin I: <0.03 ng/mL (ref <0.03)

## 2016-12-02 LAB — TSH: TSH: 2.965 u[IU]/mL (ref 0.350–4.500)

## 2016-12-02 MED ORDER — HEPARIN (PORCINE) IN NACL 100-0.45 UNIT/ML-% IJ SOLN
900.0000 [IU]/h | INTRAMUSCULAR | Status: DC
  Administered 2016-12-02 – 2016-12-03 (×2): 750 [IU]/h via INTRAVENOUS
  Filled 2016-12-02: qty 250

## 2016-12-02 NOTE — Progress Notes (Signed)
    Spoke to daughter at length about mother's condition. She was concerned that she needed heart surgery because of "hole in valve" I explained that no surgery is needed at this time. Main priority is to treat her Bilat PE's which portend increased mortality AFIB under good rate control currently  She was concerned about buldge in neck vein. I explained that this is likely from increased right sided pressure, pulm htn.  She is from [place]. She is about to have a grandchild as well.  She was very appreciative of the explanation of her mother's condition.  Donato Schultz, MD

## 2016-12-02 NOTE — Progress Notes (Signed)
PROGRESS NOTE    [name]  OQH:[id number] DOB: 1934-11-13 DOA: 12/01/2016 PCP: Ignatius Specking, MD     Brief Narrative:  Karen Richardson is a 83 y.o. female with medical history significant for hypertension and seizure disorder who presents in transfer from [facility] for management of atrial fibrillation with RVR. Patient states that she has been experiencing generalized weakness, fatigue with minimal daily activity. She then progressed to have exertional dyspnea, which prompted evaluation in the emergency department. On presentation to [facility] on 11/27/2016, she was in atrial fibrillation with RVR. She was initially treated with IV Cardizem. She was also given Rocephin and azithromycin for possible pneumonia. D-dimer was elevated and CT chest revealed right lower and middle lobe pulmonary emboli with evidence for heart strain. Patient continued to have rapid rates intermittently in the hospital and was eventually transferred to [facility] for cardiology evaluation.  Assessment & Plan:   Principal Problem:   Atrial fibrillation with RVR (HCC) Active Problems:   Hypertension   Seizures (HCC)   Pulmonary emboli (HCC)   GERD (gastroesophageal reflux disease)  Atrial fibrillation with RVR, new onset  - CHADSVASc 6 - Cardiology following  - Amiodarone gtt as well as home cardizem and toprol  - Eliquis started at [facility], switched to heparin gtt   Acute PE with right-heart strain  - Pt was initially admitted to hospital on 12/29 with suspected PNA  - D-dimer was elevated and CTA featured RLL and RML PEs with CT-evidence for right heart strain  - She was started on Eliquis at [place]. Eliquis was stopped and she was started on heparin infusion given reduced efficacy of Eliquis in patient's on Tegretol    Hypertension  - Continue Toprol and diltiazem  Questionable CAP - Pt was admitted to [facility] on 12/29 with suspected CAP and received 5 days  treatment with Rocephin and azithro prior to transfer here  Seizure disorder  - Continue Tegretol  GERD - Continue Protonix    DVT prophylaxis: heparin gtt  Code Status: Full Family Communication: family at bedside Disposition Plan: pending further evaluation   Consultants:   Cardiology  Procedures:   None  Antimicrobials:   None     Subjective: Patient states that she has been very weak with her normal daily activities. This has been going on for a few weeks. She then started having exertional dyspnea which prompted evaluation in the emergency department. She denies any heart palpitations to her knowledge. She denies any chest pain, shortness of breath at rest, nausea or vomiting.  Objective: Vitals:   12/01/16 2056 12/02/16 0512 12/02/16 1311  BP: (!) 159/142 115/67 121/65  Pulse: (!) 123 66 77  Resp: (!) 21 20 18   Temp: 97.9 F (36.6 C) 97.4 F (36.3 C) 97.6 F (36.4 C)  TempSrc: Oral Oral Oral  SpO2: 95% 99% 97%  Weight: 88.6 kg (195 lb 6.4 oz) 88.6 kg (195 lb 6.4 oz)   Height: 5\' 5"  (1.651 m)      Intake/Output Summary (Last 24 hours) at 12/02/16 1347 Last data filed at 12/02/16 0300  Gross per 24 hour  Intake           293.52 ml  Output                0 ml  Net           293.52 ml   Filed Weights   12/01/16 2056 12/02/16 0512  Weight: 88.6 kg (  195 lb 6.4 oz) 88.6 kg (195 lb 6.4 oz)    Examination:  General exam: Appears calm and comfortable  Respiratory system: Clear to auscultation. Respiratory effort normal. Cardiovascular system: S1 & S2 heard, Irregular rhythm, rate 100s. No JVD, murmurs, rubs, gallops or clicks. No pedal edema. Gastrointestinal system: Abdomen is nondistended, soft and nontender. No organomegaly or masses felt. Normal bowel sounds heard. Central nervous system: Alert and oriented. No focal neurological deficits. Extremities: Symmetric 5 x 5 power. Skin: No rashes, lesions or ulcers Psychiatry: Judgement and insight appear  normal. Mood & affect appropriate.   Data Reviewed: I have personally reviewed following labs and imaging studies  CBC:  Recent Labs Lab 12/02/16 0011 12/02/16 0958  WBC 7.0 7.2  NEUTROABS 4.3  --   HGB 11.2* 11.5*  HCT 35.5* 35.3*  MCV 96.2 94.9  PLT 192 173   Basic Metabolic Panel:  Recent Labs Lab 12/02/16 0011 12/02/16 0958  NA 141 140  K 4.8 4.3  CL 107 108  CO2 23 25  GLUCOSE 111* 106*  BUN 10 13  CREATININE 0.91 1.09*  CALCIUM 8.6* 8.6*  MG 2.1  --    GFR: Estimated Creatinine Clearance: 45.2 mL/min (by C-G formula based on SCr of 1.09 mg/dL (H)). Liver Function Tests:  Recent Labs Lab 12/02/16 0011  AST 45*  ALT 57*  ALKPHOS 83  BILITOT 0.5  PROT 6.1*  ALBUMIN 3.3*   No results for input(s): LIPASE, AMYLASE in the last 168 hours. No results for input(s): AMMONIA in the last 168 hours. Coagulation Profile: No results for input(s): INR, PROTIME in the last 168 hours. Cardiac Enzymes:  Recent Labs Lab 12/02/16 0011  TROPONINI <0.03   BNP (last 3 results) No results for input(s): PROBNP in the last 8760 hours. HbA1C: No results for input(s): HGBA1C in the last 72 hours. CBG: No results for input(s): GLUCAP in the last 168 hours. Lipid Profile: No results for input(s): CHOL, HDL, LDLCALC, TRIG, CHOLHDL, LDLDIRECT in the last 72 hours. Thyroid Function Tests:  Recent Labs  12/02/16 0011  TSH 2.965   Anemia Panel: No results for input(s): VITAMINB12, FOLATE, FERRITIN, TIBC, IRON, RETICCTPCT in the last 72 hours. Sepsis Labs: No results for input(s): PROCALCITON, LATICACIDVEN in the last 168 hours.  No results found for this or any previous visit (from the past 240 hour(s)).     Radiology Studies: Dg Chest Port 1 View  Result Date: 12/02/2016 CLINICAL DATA:  Respiratory distress EXAM: PORTABLE CHEST 1 VIEW COMPARISON:  None. FINDINGS: Non inclusion of the extreme lung bases. There is mild cardiomegaly. Diffuse mild interstitial  prominence could relate to mild interstitial infiltrates or edema. Streaky atelectasis or infiltrate at the left base. No pneumothorax. IMPRESSION: 1. Cardiomegaly. 2. Mild interstitial opacities could reflect interstitial infiltrates or mild edema. Streaky atelectasis versus mild infiltrate at the left base. Electronically Signed   By: Jasmine Pang M.D.   On: 12/02/2016 01:47      Scheduled Meds: . atorvastatin  10 mg Oral q1800  . carbamazepine  200 mg Oral TID  . diltiazem  90 mg Oral Q12H  . metoprolol succinate  25 mg Oral BID  . pantoprazole  40 mg Oral Daily  . sodium chloride flush  3 mL Intravenous Q12H   Continuous Infusions: . amiodarone 30 mg/hr (12/02/16 1321)  . heparin 1,000 Units/hr (12/01/16 2333)     LOS: 1 day    Time spent: 40 minutes   Noralee Stain, DO [facility] [email or web address] Password [email or web address] 12/02/2016, 1:47 PM

## 2016-12-02 NOTE — Progress Notes (Signed)
ANTICOAGULATION CONSULT NOTE  Pharmacy Consult for Heparin Indication: acute PE and afib  Allergies  Allergen Reactions  . Codeine Nausea Only    Patient Measurements: Height: 5\' 5"  (165.1 cm) Weight: 195 lb 6.4 oz (88.6 kg) IBW/kg (Calculated) : 57 Heparin Dosing Weight: 76 kg  Vital Signs: Temp: 97.6 F (36.4 C) (01/03 1311) Temp Source: Oral (01/03 1311) BP: 121/65 (01/03 1311) Pulse Rate: 77 (01/03 1311)  Assessment: 83 y.o. F transferred from [facility]. Eliquis 5mg  po BID started at [facility] for afib 12/30. CT showed acute PE so dose increased to 10mg  BID on 12/31 - last dose 1/2 a.m. Since pt on carbamazepine (can decrease apixaban concentration - not recommended to use together), Dr. Antionette Char will change to heparin per Pharmacy. Apixaban will still be affecting heparin level, so will utilize aPTT for monitoring. Likely will need transition to coumadin.  Last aPTT was elevated > 200 seconds. Lab was drawn from opposite arm from where heparin was infusing and no issues reported.  Goal of Therapy:  Heparin level 0.3-0.7 units/ml; aPTT 66-102 sec Monitor platelets by anticoagulation protocol: Yes   Plan:  Hold heparin gtt for 1 hour Restart heparin gtt at 750 units/hr at 2130 tonight  Recheck 8 hr aPTT  Monitor daily aPTT / heparin level, CBC, s/s of bleed F/u long-term anticoagulation plan  Enzo Bi, PharmD, BCPS Clinical Pharmacist Pager [phone] 12/02/2016 8:16 PM

## 2016-12-02 NOTE — Progress Notes (Addendum)
CRITICAL VALUE ALERT  Critical value received:  ptt > 200  Date of notification:  12/02/2016  Time of notification:  2000  Critical value read back:Yes.    Nurse who received alert:  Lutricia Horsfall RN  MD notified (1st page):  [place]   Time of first page:  2005  MD notified (2nd page):  Time of second page:  Responding MD:  Enzo Bi, Pharmacist  Time MD responded:  2005

## 2016-12-02 NOTE — Progress Notes (Signed)
ANTICOAGULATION CONSULT NOTE  Pharmacy Consult for Heparin Indication: acute PE and afib  Allergies  Allergen Reactions  . Codeine Nausea Only    Patient Measurements: Height: 5\' 5"  (165.1 cm) Weight: 195 lb 6.4 oz (88.6 kg) IBW/kg (Calculated) : 57 Heparin Dosing Weight: 76 kg  Vital Signs: Temp: 97.4 F (36.3 C) (01/03 0512) Temp Source: Oral (01/03 0512) BP: 115/67 (01/03 0512) Pulse Rate: 66 (01/03 0512)  Labs:  Recent Labs  12/02/16 0011  HGB 11.2*  HCT 35.5*  PLT 192  APTT 39*  HEPARINUNFRC >2.20*  CREATININE 0.91  TROPONINI <0.03    Estimated Creatinine Clearance: 54.2 mL/min (by C-G formula based on SCr of 0.91 mg/dL).   Medical History: Past Medical History:  Diagnosis Date  . Hypertension    x 4 months  . Seizures [facility])     Assessment: 83 y.o. F transferred from [facility]. Eliquis 5mg  po BID started at [facility] for afib 12/30. CT showed acute PE so dose increased to 10mg  BID on 12/31 - last dose 1/2 a.m. Since pt on carbamazepine (can decrease apixaban concentration - not recommended to use together), Dr. Antionette Char will change to heparin per Pharmacy. Apixaban will still be affecting heparin level, so will utilize aPTT for monitoring. Likely will need transition to coumadin.  APTT this AM therapeutic (99) on heparin at 1000 units/h. Hg 11.2, plt wnl, no bleeding documented.  Goal of Therapy:  Heparin level 0.3-0.7 units/ml; aPTT 66-102 sec Monitor platelets by anticoagulation protocol: Yes   Plan:  Heparin gtt at 1000 units/hr 8hr aPTT to confirm Daily aPTT, heparin level, and CBC Monitor for s/sx bleeding F/u long-term anticoagulation plan   Babs Bertin, PharmD, BCPS Clinical Pharmacist 12/02/2016 10:11 AM

## 2016-12-02 NOTE — Progress Notes (Signed)
    AFIB HR 100-120's better control. Reviewed Tele.  No change, continue AMIO. See Consult note from early this AM for full details.   Donato Schultz, MD

## 2016-12-02 NOTE — Consult Note (Addendum)
Admit date: 12/01/2016 Referring Physician  Dr. Antionette Char Primary Cardiologist  None Reason for Consultation  Atrial fibrillation with RVR  HPI: This is an 83yo WF with a history of seizure d/o, HTN, left carotid artery stenosis (40-59%), GERD, pumlmonary HTN and hyperlipidemia who presented to the ER with compliants of palpitations and SOB.  She had been having SOB for 2 weeks and was diagnosed with RLL PNA and refused admission initially.    Her symptoms worsened with palpitations and presented to [facility] ER and was noted to be in afib with RVR.  She was admitted and started on IV antibx as well as IV Cardizem gtt for rate control.  She was transitioned to PO Cardizem and Toprol and started on Eliquis.  D-Dimer was elevated and chest CT showed RLL and RML pulmonary emboli and evidence of RV strain. 2D echo showed moderate MR, severe biatrial enlargement, normal LVF and severe TR with at least moderate pulmonary HTN.  The family requested transfer to [facility] to the hospitalist service.  Cardiology is asked to consult for help in management of atrial fibrillation with RVR.  Currently on IV Amio gtt with improved rate control.  The patient denies any chest pain but has had some pressure in her chest along with palpitations intermittently for 4 weeks.      PMH:   Past Medical History:  Diagnosis Date  . Hypertension    x 4 months  . Seizures (HCC)      PSH:   Past Surgical History:  Procedure Laterality Date  . BREAST ENHANCEMENT SURGERY     40 yrs ago  . PARTIAL HYSTERECTOMY    . 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 amLODipine (NORVASC) 5 MG tablet Take 5 mg by mouth daily.   Taking  . atorvastatin (LIPITOR) 10 MG tablet Take 10 mg by mouth daily.   Taking  . benazepril (LOTENSIN) 20 MG tablet   3 Taking  . carbamazepine (TEGRETOL) 200 MG tablet   11 Taking  .  meclizine (ANTIVERT) 25 MG tablet   0 Taking  . metoprolol (TOPROL-XL) 50 MG 24 hr tablet Take 50 mg by mouth daily.     Taking  . naproxen (NAPROSYN) 500 MG tablet Take 500 mg by mouth 2 (two) times daily with a meal.   Taking  . pantoprazole (PROTONIX) 40 MG tablet   4 Taking   Fam HX:   History reviewed. No pertinent family history. Social HX:    Social History   Social History  . Marital status: Married    Spouse name: N/A  . Number of children: N/A  . Years of education: N/A   Occupational History  . Not on file.   Social History Main Topics  . Smoking status: Never Smoker  . Smokeless tobacco: Not on file  . Alcohol use No  . Drug use: No  . Sexual activity: Not on file   Other Topics Concern  . Not on file   Social History Narrative  . No narrative on file     ROS:  All 11 ROS were addressed and are negative except what is stated in the HPI  Physical Exam: Blood pressure (!) 159/142, pulse (!) 123, temperature 97.9 F (36.6 C), temperature source Oral, resp. rate (!) 21, height 5\' 5"  (1.651 m), weight 195  lb 6.4 oz (88.6 kg), SpO2 95 %.    General: Well developed, well nourished, in no acute distress Head: Eyes PERRLA, No xanthomas.   Normal cephalic and atramatic  Lungs:   Clear bilaterally to auscultation and percussion. Heart:   Irregularly irregular S1 S2 Pulses are 2+ & equal.            No carotid bruit. No JVD.  No abdominal bruits. No femoral bruits. Abdomen: Bowel sounds are positive, abdomen soft and non-tender without masses  Msk:  Back normal, normal gait. Normal strength and tone for age. Extremities:   No clubbing, cyanosis or edema.  DP +1 Neuro: Alert and oriented X 3. Psych:  Good affect, responds appropriately    Labs:  No results found for: WBC, HGB, HCT, MCV, PLT No results for input(s): NA, K, CL, CO2, BUN, CREATININE, CALCIUM, PROT, BILITOT, ALKPHOS, ALT, AST, GLUCOSE in the last 168 hours.  Invalid input(s): LABALBU No results found  for: PTT No results found for: INR, PROTIME No results found for: CKTOTAL, CKMB, CKMBINDEX, TROPONINI  No results found for: CHOL No results found for: HDL No results found for: LDLCALC No results found for: TRIG No results found for: CHOLHDL No results found for: LDLDIRECT    Radiology:  No results found.  EKG:  Atrial fibrillation with RVR at 105bpm.    ASSESSMENT/PLAN:   1.  Atrial fibrillation with RVR secondary to submassive PEs.  Her HR is much improved on IV Amio.  Will continue on Amio gtt for rate control.  Continue Cardizem PO.  TitrateToprol as needed for better rate control and hopefully will be able to wean off Amio.  Given unknown duration of afib, she is at higher risk of cardioembolic event if she converts to NSR on the Amio.  She was on Eliquis at discharge but now on IV Heparin gtt. This patients CHA2DS2-VASc Score and unadjusted Ischemic Stroke Rate (% per year) is equal to 12.5 % stroke rate/year from a score of 5  2.  Submassive RLL and RML PEs with moderate pulmonary HTN on echo - per TRH.  Continue IV Heparin gtt.  RV size and function were normal on echo.  3.  HTN - BP controlled on current meds.   4.  Moderate pulmonary HTN with severe TR on echo secondary to #2.    Armanda Magic, MD  12/02/2016  1:10 AM

## 2016-12-03 LAB — BASIC METABOLIC PANEL
ANION GAP: 8 (ref 5–15)
BUN: 15 mg/dL (ref 6–20)
CALCIUM: 8.6 mg/dL — AB (ref 8.9–10.3)
CHLORIDE: 107 mmol/L (ref 101–111)
CO2: 23 mmol/L (ref 22–32)
Creatinine, Ser: 0.91 mg/dL (ref 0.44–1.00)
GFR calc Af Amer: >60 mL/min (ref >60)
GFR calc non Af Amer: 58 mL/min — ABNORMAL LOW (ref >60)
GLUCOSE: 108 mg/dL — AB (ref 65–99)
Potassium: 4.6 mmol/L (ref 3.5–5.1)
Sodium: 138 mmol/L (ref 135–145)

## 2016-12-03 LAB — APTT: aPTT: 59 seconds — ABNORMAL HIGH (ref 24–36)

## 2016-12-03 LAB — CBC
HEMATOCRIT: 35.9 % — AB (ref 36.0–46.0)
HEMOGLOBIN: 11.5 g/dL — AB (ref 12.0–15.0)
MCH: 30.5 pg (ref 26.0–34.0)
MCHC: 32 g/dL (ref 30.0–36.0)
MCV: 95.2 fL (ref 78.0–100.0)
Platelets: 167 10*3/uL (ref 150–400)
RBC: 3.77 MIL/uL — ABNORMAL LOW (ref 3.87–5.11)
RDW: 15.2 % (ref 11.5–15.5)
WBC: 7.8 10*3/uL (ref 4.0–10.5)

## 2016-12-03 LAB — HEPARIN LEVEL (UNFRACTIONATED): Heparin Unfractionated: 0.95 IU/mL — ABNORMAL HIGH (ref 0.30–0.70)

## 2016-12-03 LAB — PROTIME-INR
INR: 1.15
PROTHROMBIN TIME: 14.8 s (ref 11.4–15.2)

## 2016-12-03 MED ORDER — COUMADIN BOOK
Freq: Once | Status: AC
  Administered 2016-12-03: 10:00:00
  Filled 2016-12-03: qty 1

## 2016-12-03 MED ORDER — WARFARIN - PHARMACIST DOSING INPATIENT
Freq: Every day | Status: DC
  Administered 2016-12-06 – 2016-12-09 (×2)
  Filled 2016-12-03: qty 1

## 2016-12-03 MED ORDER — WARFARIN SODIUM 2.5 MG PO TABS
2.5000 mg | ORAL_TABLET | Freq: Once | ORAL | Status: AC
  Administered 2016-12-03: 2.5 mg via ORAL
  Filled 2016-12-03: qty 1

## 2016-12-03 MED ORDER — WARFARIN VIDEO
Freq: Once | Status: AC
  Administered 2016-12-03: 10:00:00

## 2016-12-03 MED ORDER — AMIODARONE HCL 200 MG PO TABS
400.0000 mg | ORAL_TABLET | Freq: Two times a day (BID) | ORAL | Status: DC
  Administered 2016-12-03 – 2016-12-06 (×7): 400 mg via ORAL
  Filled 2016-12-03 (×7): qty 2

## 2016-12-03 MED ORDER — ENOXAPARIN SODIUM 100 MG/ML ~~LOC~~ SOLN
1.0000 mg/kg | Freq: Two times a day (BID) | SUBCUTANEOUS | Status: DC
  Administered 2016-12-03 – 2016-12-07 (×9): 90 mg via SUBCUTANEOUS
  Filled 2016-12-03 (×10): qty 1

## 2016-12-03 MED ORDER — METOPROLOL SUCCINATE ER 50 MG PO TB24
50.0000 mg | ORAL_TABLET | Freq: Two times a day (BID) | ORAL | Status: DC
Start: 2016-12-03 — End: 2016-12-04
  Administered 2016-12-03 – 2016-12-04 (×2): 50 mg via ORAL
  Filled 2016-12-03 (×2): qty 1

## 2016-12-03 NOTE — Progress Notes (Signed)
ANTICOAGULATION CONSULT NOTE - Follow Up Consult  Pharmacy Consult for heparin Indication: atrial fibrillation and pulmonary embolus  Labs:  Recent Labs  12/02/16 0011 12/02/16 0958 12/02/16 1626 12/03/16 0615  HGB 11.2* 11.5*  --  11.5*  HCT 35.5* 35.3*  --  35.9*  PLT 192 173  --  167  APTT 39* 99* >200* 59*  HEPARINUNFRC >2.20*  --   --  0.95*  CREATININE 0.91 1.09*  --  0.91  TROPONINI <0.03  --   --   --     Assessment: 83yo female now subtherapeutic on heparin after rate change for high PTT.  Goal of Therapy:  aPTT 66-102 seconds   Plan:  Will increase heparin gtt by 2 units/kg/hr to 900 units/hr and check PTT in 8hr.  Vernard Gambles, PharmD, BCPS  12/03/2016,7:49 AM

## 2016-12-03 NOTE — Progress Notes (Signed)
Patient Name: Karen Richardson Date of Encounter: 12/03/2016  Primary Cardiologist: [facility] Problem List     Principal Problem:   Atrial fibrillation with RVR [facility]) Active Problems:   Hypertension   Seizures (HCC)   Pulmonary emboli (HCC)   GERD (gastroesophageal reflux disease)     Subjective   Feeling better, no significant chest pain. Minimal shortness of breath.  Inpatient Medications    Scheduled Meds: . atorvastatin  10 mg Oral q1800  . carbamazepine  200 mg Oral TID  . coumadin book   Does not apply Once  . diltiazem  90 mg Oral Q12H  . enoxaparin (LOVENOX) injection  1 mg/kg Subcutaneous Q12H  . metoprolol succinate  25 mg Oral BID  . pantoprazole  40 mg Oral Daily  . sodium chloride flush  3 mL Intravenous Q12H  . warfarin  2.5 mg Oral ONCE-1800  . warfarin   Does not apply Once  . Warfarin - Pharmacist Dosing Inpatient   Does not apply q1800   Continuous Infusions: . amiodarone 30 mg/hr (12/03/16 1248)   PRN Meds: sodium chloride, acetaminophen, ALPRAZolam, meclizine, ondansetron (ZOFRAN) IV, sodium chloride flush   Vital Signs    Vitals:   12/02/16 2102 12/03/16 0200 12/03/16 0600 12/03/16 1053  BP: 139/89 (!) 118/95 (!) 136/96 136/77  Pulse: (!) 130 (!) 121 (!) 111 (!) 114  Resp: 18 16 18    Temp: 97.6 F (36.4 C)     TempSrc: Oral     SpO2: 99%  100%   Weight:   196 lb (88.9 kg)   Height:        Intake/Output Summary (Last 24 hours) at 12/03/16 1331 Last data filed at 12/03/16 0820  Gross per 24 hour  Intake            479.5 ml  Output                0 ml  Net            479.5 ml   Filed Weights   12/01/16 2056 12/02/16 0512 12/03/16 0600  Weight: 195 lb 6.4 oz (88.6 kg) 195 lb 6.4 oz (88.6 kg) 196 lb (88.9 kg)    Physical Exam    GEN: Well nourished, well developed, in no acute distress.  HEENT: Grossly normal.  Neck: Supple, no JVD, carotid bruits, or masses.Bulging neck veins noted Cardiac: Irregularly irregular,  mildly tachycardic at times, 2/6 systolic murmur, no rubs, or gallops. No clubbing, cyanosis, edema.  Radials/DP/PT 2+ and equal bilaterally.  Respiratory:  Respirations regular and unlabored, clear to auscultation bilaterally. GI: Soft, nontender, nondistended, BS + x 4. MS: no deformity or atrophy. Skin: warm and dry, no rash. Neuro:  Strength and sensation are intact. Psych: AAOx3.  Normal affect.  Labs    CBC  Recent Labs  12/02/16 0011 12/02/16 0958 12/03/16 0615  WBC 7.0 7.2 7.8  NEUTROABS 4.3  --   --   HGB 11.2* 11.5* 11.5*  HCT 35.5* 35.3* 35.9*  MCV 96.2 94.9 95.2  PLT 192 173 167   Basic Metabolic Panel  Recent Labs  12/02/16 0011 12/02/16 0958 12/03/16 0615  NA 141 140 138  K 4.8 4.3 4.6  CL 107 108 107  CO2 23 25 23   GLUCOSE 111* 106* 108*  BUN 10 13 15   CREATININE 0.91 1.09* 0.91  CALCIUM 8.6* 8.6* 8.6*  MG 2.1  --   --    Liver Function Tests  Recent Labs  12/02/16 0011  AST 45*  ALT 57*  ALKPHOS 83  BILITOT 0.5  PROT 6.1*  ALBUMIN 3.3*   No results for input(s): LIPASE, AMYLASE in the last 72 hours. Cardiac Enzymes  Recent Labs  12/02/16 0011  TROPONINI <0.03   BNP Invalid input(s): POCBNP D-Dimer No results for input(s): DDIMER in the last 72 hours. Hemoglobin A1C No results for input(s): HGBA1C in the last 72 hours. Fasting Lipid Panel No results for input(s): CHOL, HDL, LDLCALC, TRIG, CHOLHDL, LDLDIRECT in the last 72 hours. Thyroid Function Tests  Recent Labs  12/02/16 0011  TSH 2.965    Telemetry    Atrial fibrillation heart rate between 80 and 120 - Personally Reviewed  ECG    Atrial fibrillation - Personally Reviewed  Radiology    Dg Chest Port 1 View  Result Date: 12/02/2016 CLINICAL DATA:  Respiratory distress EXAM: PORTABLE CHEST 1 VIEW COMPARISON:  None. FINDINGS: Non inclusion of the extreme lung bases. There is mild cardiomegaly. Diffuse mild interstitial prominence could relate to mild interstitial  infiltrates or edema. Streaky atelectasis or infiltrate at the left base. No pneumothorax. IMPRESSION: 1. Cardiomegaly. 2. Mild interstitial opacities could reflect interstitial infiltrates or mild edema. Streaky atelectasis versus mild infiltrate at the left base. Electronically Signed   By: Jasmine Pang M.D.   On: 12/02/2016 01:47    Cardiac Studies   2D echo from [facility] showed moderate MR, severe biatrial enlargement, normal LVF and severe TR with at least moderate pulmonary HTN.   Patient Profile     83 year old female, quite active previously on farm with bilateral pulmonary embolism, atrial fibrillation, tricuspid regurgitation, secondary pulmonary hypertension with seizure disorder on Tegretol  Assessment & Plan    Atrial fibrillation  - I will increase her metoprolol from 25 mg twice a day to 50 mg twice a day.  - She is also taking diltiazem 90 mg every 12 hours. We can increase this as well if necessary, as blood pressure allows.  - I think would be reasonable to change amiodarone to by mouth. I will change her to amiodarone 400 mg twice a day, stop IV  - Likely secondary from PE  Chronic anticoagulation  - Coumadin secondary to Tegretol use. Could not take DOAC  Bilateral pulmonary embolism  - Question etiology, she had times she may have minor leg injuries on the farm, tripped over something for instance. Perhaps this initiated the inflammatory cascade.  - Lovenox bridge with Coumadin.  - Explained at length to her daughter that this increases morbidity/mortality. She was under the impression that she was in need of heart surgery and that was the reason for transfer here. I explained her that heart surgery is not necessary in this situation.  Tricuspid regurgitation  - Severe, associated with moderate pulmonary hypertension, secondary from pulmonary embolism  Mitral regurgitation  - Moderate in severity  - Continue to treat atrial fibrillation, PE  Signed, Donato Schultz, MD  12/03/2016, 1:31 PM

## 2016-12-03 NOTE — Evaluation (Signed)
Physical Therapy Evaluation Patient Details Name: Karen Richardson MRN: [id number] DOB: [birth year] Today's Date: 12/03/2016   History of Present Illness  pt is an 83 y/o female admitted as a transfer from [facility] for management of Afib with RVR and acute PE.  PMH singificant for HTN and seizures.   Clinical Impression  Pt currently presents with decreased static/dynamic standing balance, decreased postural control, impaired protective responses and righting reactions, and decreased overall activity tolerance.  Pt O2 remained >95% throughout session on RA, but pt notably SOB following ambulation.  Pt currently requires min assist overall, but adamant about not pursing post acute rehab prior to returning home, therefore recommending use of RW at home (pt states she already has one), 24/7 supervision, and f/u with HHPT.     Follow Up Recommendations Home health PT;Supervision/Assistance - 24 hour    Equipment Recommendations  None recommended by PT (recommended pt use the RW she has)    Recommendations for Other Services       Precautions / Restrictions Precautions Precautions: Fall Precaution Comments: decreased safety awareness and balance reactions Restrictions Weight Bearing Restrictions: No      Mobility  Bed Mobility Overal bed mobility: Needs Assistance Bed Mobility: Supine to Sit;Sit to Supine     Supine to sit: Supervision Sit to supine: Supervision   General bed mobility comments: Supervision for safety; increased time but no physical assist required.   Transfers Overall transfer level: Needs assistance Equipment used: None Transfers: Sit to/from Stand Sit to Stand: Min guard         General transfer comment: pt unsteady with poor postural control in static stance, delayed righting reactions and protective responses  Ambulation/Gait Ambulation/Gait assistance: Min guard;Min assist Ambulation Distance (Feet): 200 Feet Assistive device: None Gait  Pattern/deviations: Step-to pattern;Staggering left;Staggering right;Shuffle   Gait velocity interpretation: Below normal speed for age/gender    Stairs            Wheelchair Mobility    Modified Rankin (Stroke Patients Only)       Balance Overall balance assessment: Needs assistance Sitting-balance support: Feet supported;No upper extremity supported Sitting balance-Leahy Scale: Good     Standing balance support: Single extremity supported Standing balance-Leahy Scale: Poor Standing balance comment: pt holding IV pole and requires min assist for balance                             Pertinent Vitals/Pain Pain Assessment: No/denies pain    Home Living Family/patient expects to be discharged to:: Private residence Living Arrangements: Spouse/significant other Available Help at Discharge: Family;Available PRN/intermittently Type of Home: House Home Access: Ramped entrance (+1 large step to enter home)     Home Layout: Two level;Able to live on main level with bedroom/bathroom Home Equipment: Dan Humphreys - 2 wheels;Cane - single point;Shower seat;Hand held shower head Additional Comments: Has 5 indoor dogs and 3 cats, 2 outdoor dogs and multiple outdoor cats that she takes care of.    Prior Function Level of Independence: Independent         Comments: drives 4 wheeler on property, but endorses multiple falls in the past 6 months     Hand Dominance        Extremity/Trunk Assessment   Upper Extremity Assessment Upper Extremity Assessment: Overall WFL for tasks assessed    Lower Extremity Assessment Lower Extremity Assessment: Overall WFL for tasks assessed    Cervical / Trunk Assessment Cervical / Trunk  Assessment: Normal  Communication   Communication: No difficulties  Cognition Arousal/Alertness: Awake/alert Behavior During Therapy: WFL for tasks assessed/performed Overall Cognitive Status: Within Functional Limits for tasks assessed                       General Comments      Exercises     Assessment/Plan    PT Assessment Patient needs continued PT services  PT Problem List Decreased strength;Decreased range of motion;Decreased activity tolerance;Decreased balance;Decreased mobility;Decreased coordination;Decreased cognition;Decreased knowledge of use of DME;Decreased safety awareness;Decreased knowledge of precautions          PT Treatment Interventions DME instruction;Gait training;Stair training;Functional mobility training;Therapeutic activities;Therapeutic exercise;Balance training;Neuromuscular re-education;Cognitive remediation;Patient/family education    PT Goals (Current goals can be found in the Care Plan section)  Acute Rehab PT Goals Patient Stated Goal: get back home PT Goal Formulation: With patient Time For Goal Achievement: 12/10/16 Potential to Achieve Goals: Fair    Frequency Min 3X/week   Barriers to discharge Inaccessible home environment;Decreased caregiver support      Co-evaluation               End of Session Equipment Utilized During Treatment: Gait belt Activity Tolerance: Patient tolerated treatment well (increased SOB following ambulation but O2 remained >95% ) Patient left: in bed;with call bell/phone within reach;with bed alarm set Nurse Communication: Mobility status         Time: 1401-1420 PT Time Calculation (min) (ACUTE ONLY): 19 min   Charges:   PT Evaluation $PT Eval Low Complexity: 1 Procedure     PT G Codes:        Kostantinos Tallman E Penven-Crew 12/03/2016, 2:57 PM

## 2016-12-03 NOTE — Progress Notes (Addendum)
ANTICOAGULATION CONSULT NOTE  Pharmacy Consult for Heparin > Lovenox/warfarin Indication: acute PE and afib  Allergies  Allergen Reactions  . Codeine Nausea Only    Patient Measurements: Height: 5\' 5"  (165.1 cm) Weight: 196 lb (88.9 kg) IBW/kg (Calculated) : 57 Heparin Dosing Weight: 76 kg  Vital Signs: BP: 136/96 (01/04 0600) Pulse Rate: 111 (01/04 0600)  Assessment: 83 y.o. F transferred from [facility]. Eliquis 5mg  po BID started at [facility] for afib 12/30. CT showed acute PE so dose increased to 10mg  BID on 12/31 - last dose 1/2 a.m. Was transitioned to heparin per Pharmacy. Since pt on carbamazepine (can decrease apixaban concentration - not recommended to use together), Pharmacy consulted to transition to warfarin + Lovenox bridge on 1/4. Noted on new amiodarone. No baseline INR available. CBC/renal function stable, no bleeding documented.   Goal of Therapy:  Anti-Xa level 0.6-1 units/ml 4hrs after LMWH dose given Monitor platelets by anticoagulation protocol: Yes   Plan:  Check baseline INR Lovenox 90mg  (~1mg /kg) [place] q12h - d/c when INR therapeutic >24h Warfarin 2.5mg  PO x 1 dose tonight Daily INR, CBC at least q72h while on Lovenox Monitor s/sx bleeding   Babs Bertin, PharmD, BCPS Clinical Pharmacist 12/03/2016 9:23 AM   ADDENDUM: baseline INR 1.15  Babs Bertin, PharmD, BCPS Clinical Pharmacist 12/03/2016 1:46 PM

## 2016-12-03 NOTE — Evaluation (Signed)
Occupational Therapy Evaluation Patient Details Name: Karen Richardson MRN: [id number] DOB: 10-29-1934 Today's Date: 12/03/2016    History of Present Illness 83 y.o. female with medical history significant for hypertension and seizure disorder who presents in transfer from [facility] for management of atrial fibrillation with RVR. Patient initially presented with cough and congestion and had also been experiencing intermittent palpitations associated with breathlessness for the preceding month.   Clinical Impression   Pt reports she was independent with ADL PTA. Currently pt min assist for functional mobility and ADL due to balance deficits. Pt reports hx of falls at home PTA and has multiple indoor and outdoor animals that she cares for; currently pt is at a very high fall and safety risk. Pt planning to d/c home with supervision from family. Recommending HHOT and 24/7 supervision upon return home to maximize independence and safety with ADL and functional mobility. Pt would benefit from continued skilled OT to address established goals.    Follow Up Recommendations  Home health OT;Supervision/Assistance - 24 hour    Equipment Recommendations  None recommended by OT    Recommendations for Other Services PT consult     Precautions / Restrictions Precautions Precautions: Fall Restrictions Weight Bearing Restrictions: No      Mobility Bed Mobility Overal bed mobility: Needs Assistance Bed Mobility: Supine to Sit;Sit to Supine     Supine to sit: Supervision Sit to supine: Supervision   General bed mobility comments: Supervision for safety; increased time but no physical assist required.   Transfers Overall transfer level: Needs assistance Equipment used: None Transfers: Sit to/from Stand Sit to Stand: Min guard         General transfer comment: Min guard for safety; pt unsteady on feet but no major LOB.    Balance Overall balance assessment: Needs  assistance Sitting-balance support: Feet supported;No upper extremity supported Sitting balance-Leahy Scale: Good     Standing balance support: Single extremity supported Standing balance-Leahy Scale: Poor Standing balance comment: pt holding IV pole and requires min assist for balance                            ADL Overall ADL's : Needs assistance/impaired Eating/Feeding: Set up;Sitting   Grooming: Min guard;Standing   Upper Body Bathing: Set up;Supervision/ safety;Sitting   Lower Body Bathing: Min guard;Sit to/from stand   Upper Body Dressing : Set up;Supervision/safety;Sitting   Lower Body Dressing: Min guard;Sit to/from stand   Toilet Transfer: Minimal assistance;Ambulation;Regular Teacher, adult education Details (indicate cue type and reason): Simulated by sit to stand from EOB with functional mobility in room.         Functional mobility during ADLs: Minimal assistance (holding IV pole) General ADL Comments: Pt unsteady on feet but reports she has been unsteady for awhile now. SpO2 >95% on RA throughout activity; reapplied 2L O2 at end of session-RN aware.     Vision Vision Assessment?: No apparent visual deficits   Perception     Praxis      Pertinent Vitals/Pain Pain Assessment: No/denies pain     Hand Dominance     Extremity/Trunk Assessment Upper Extremity Assessment Upper Extremity Assessment: Overall WFL for tasks assessed   Lower Extremity Assessment Lower Extremity Assessment: Defer to PT evaluation   Cervical / Trunk Assessment Cervical / Trunk Assessment: Normal   Communication Communication Communication: No difficulties   Cognition Arousal/Alertness: Awake/alert Behavior During Therapy: WFL for tasks assessed/performed Overall Cognitive Status:  Within Functional Limits for tasks assessed                     General Comments       Exercises       Shoulder Instructions      Home Living Family/patient expects  to be discharged to:: Private residence Living Arrangements: Spouse/significant other Available Help at Discharge: Family;Available PRN/intermittently Type of Home: House Home Access: Ramped entrance (+1 large step into home)     Home Layout: Two level;Able to live on main level with bedroom/bathroom (basement)     Bathroom Shower/Tub: Other (comment) (walk in tub)   Bathroom Toilet: Handicapped height     Home Equipment: Walker - 2 wheels;Cane - single point;Shower seat;Hand held shower head   Additional Comments: Has 5 indoor dogs and 3 cats, 2 outdoor dogs and multiple outdoor cats that she takes care of.      Prior Functioning/Environment Level of Independence: Independent        Comments: drives 4 wheeler on property, ~2 falls within the past year        OT Problem List: Decreased strength;Decreased activity tolerance;Impaired balance (sitting and/or standing);Decreased safety awareness;Decreased knowledge of use of DME or AE;Decreased knowledge of precautions   OT Treatment/Interventions: Self-care/ADL training;Energy conservation;DME and/or AE instruction;Therapeutic activities;Patient/family education;Balance training    OT Goals(Current goals can be found in the care plan section) Acute Rehab OT Goals Patient Stated Goal: get back home to my animals OT Goal Formulation: With patient Time For Goal Achievement: 12/17/16 Potential to Achieve Goals: Good ADL Goals Pt Will Transfer to Toilet: with supervision;ambulating;regular height toilet Pt Will Perform Toileting - Clothing Manipulation and hygiene: with supervision;sit to/from stand Additional ADL Goal #1: Pt will independently verbally recall 3 energy conservation strategies. Additional ADL Goal #2: Pt will gather ADL items and perform UB/LB bathing and dressing with supervision.  OT Frequency: Min 2X/week   Barriers to D/C: Decreased caregiver support  home alone during the day       Co-evaluation               End of Session Equipment Utilized During Treatment: Oxygen Nurse Communication: Mobility status  Activity Tolerance: Patient tolerated treatment well Patient left: in bed;with call bell/phone within reach   Time: 1345-1400 OT Time Calculation (min): 15 min Charges:  OT General Charges $OT Visit: 1 Procedure OT Evaluation $OT Eval Moderate Complexity: 1 Procedure G-Codes:     Gaye Alken M.S., OTR/L Pager: [phone]  12/03/2016, 2:17 PM

## 2016-12-03 NOTE — Progress Notes (Signed)
PROGRESS NOTE    Karen Richardson  ZOX:[id number] DOB: 06/06/1934 DOA: 12/01/2016 PCP: Ignatius Specking, MD     Brief Narrative:  Karen Richardson is a 83 y.o. female with medical history significant for hypertension and seizure disorder who presents in transfer from [facility] for management of atrial fibrillation with RVR. Patient states that she has been experiencing generalized weakness, fatigue with minimal daily activity. She then progressed to have exertional dyspnea, which prompted evaluation in the emergency department. On presentation to [facility] on 11/27/2016, she was in atrial fibrillation with RVR. She was initially treated with IV Cardizem. She was also given Rocephin and azithromycin for possible pneumonia. D-dimer was elevated and CT chest revealed right lower and middle lobe pulmonary emboli with evidence for heart strain. Patient continued to have rapid rates intermittently in the hospital and was eventually transferred to [facility] for cardiology evaluation.  Assessment & Plan:   Principal Problem:   Atrial fibrillation with RVR (HCC) Active Problems:   Hypertension   Seizures (HCC)   Pulmonary emboli (HCC)   GERD (gastroesophageal reflux disease)  Atrial fibrillation with RVR, new onset  - CHADSVASc 6 - Cardiology following  - Amiodarone gtt as well as home cardizem and toprol  - Eliquis started at [facility], switched to heparin gtt. Due to interactions with tegretol, patient is started on coumadin/lovenox to bridge   Acute PE with right-heart strain  - Pt was initially admitted to hospital on 12/29 with suspected PNA  - D-dimer was elevated and CTA featured RLL and RML PEs with CT-evidence for right heart strain  - Eliquis started at [facility], switched to heparin gtt. Due to interactions with tegretol, patient is started on coumadin/lovenox to bridge   Hypertension  - Continue Toprol and diltiazem  Questionable CAP - Pt was admitted to  [facility] on 12/29 with suspected CAP and received 5 days treatment with Rocephin and azithro prior to transfer here  Seizure disorder  - Continue Tegretol  GERD - Continue Protonix    DVT prophylaxis: lovenox/coumadin  Code Status: Full Family Communication: daughter at bedside Disposition Plan: pending further evaluation of PT OT    Consultants:   Cardiology  Procedures:   None  Antimicrobials:   None     Subjective: No new events overnight. Continues to be very weak. Answered daughter's questions regarding patient's previous vague symptoms of leg heaviness, previous stroke, abdominal bloating.  Objective: Vitals:   12/02/16 2102 12/03/16 0200 12/03/16 0600 12/03/16 1053  BP: 139/89 (!) 118/95 (!) 136/96 136/77  Pulse: (!) 130 (!) 121 (!) 111 (!) 114  Resp: 18 16 18    Temp: 97.6 F (36.4 C)     TempSrc: Oral     SpO2: 99%  100%   Weight:   88.9 kg (196 lb)   Height:        Intake/Output Summary (Last 24 hours) at 12/03/16 1120 Last data filed at 12/03/16 0820  Gross per 24 hour  Intake            479.5 ml  Output                0 ml  Net            479.5 ml   Filed Weights   12/01/16 2056 12/02/16 0512 12/03/16 0600  Weight: 88.6 kg (195 lb 6.4 oz) 88.6 kg (195 lb 6.4 oz) 88.9 kg (196 lb)    Examination:  General exam: Appears calm  and comfortable  Respiratory system: Clear to auscultation. Respiratory effort normal. Cardiovascular system: S1 & S2 heard, Irregular rhythm. No JVD, murmurs, rubs, gallops or clicks. No pedal edema. Gastrointestinal system: Abdomen is nondistended, soft and nontender. No organomegaly or masses felt. Normal bowel sounds heard. Central nervous system: Alert and oriented. No focal neurological deficits. Extremities: Symmetric 5 x 5 power. Skin: No rashes, lesions or ulcers Psychiatry: Judgement and insight appear normal. Mood & affect appropriate.   Data Reviewed: I have personally reviewed following labs and imaging  studies  CBC:  Recent Labs Lab 12/02/16 0011 12/02/16 0958 12/03/16 0615  WBC 7.0 7.2 7.8  NEUTROABS 4.3  --   --   HGB 11.2* 11.5* 11.5*  HCT 35.5* 35.3* 35.9*  MCV 96.2 94.9 95.2  PLT 192 173 167   Basic Metabolic Panel:  Recent Labs Lab 12/02/16 0011 12/02/16 0958 12/03/16 0615  NA 141 140 138  K 4.8 4.3 4.6  CL 107 108 107  CO2 23 25 23   GLUCOSE 111* 106* 108*  BUN 10 13 15   CREATININE 0.91 1.09* 0.91  CALCIUM 8.6* 8.6* 8.6*  MG 2.1  --   --    GFR: Estimated Creatinine Clearance: 54.3 mL/min (by C-G formula based on SCr of 0.91 mg/dL). Liver Function Tests:  Recent Labs Lab 12/02/16 0011  AST 45*  ALT 57*  ALKPHOS 83  BILITOT 0.5  PROT 6.1*  ALBUMIN 3.3*   No results for input(s): LIPASE, AMYLASE in the last 168 hours. No results for input(s): AMMONIA in the last 168 hours. Coagulation Profile: No results for input(s): INR, PROTIME in the last 168 hours. Cardiac Enzymes:  Recent Labs Lab 12/02/16 0011  TROPONINI <0.03   BNP (last 3 results) No results for input(s): PROBNP in the last 8760 hours. HbA1C: No results for input(s): HGBA1C in the last 72 hours. CBG: No results for input(s): GLUCAP in the last 168 hours. Lipid Profile: No results for input(s): CHOL, HDL, LDLCALC, TRIG, CHOLHDL, LDLDIRECT in the last 72 hours. Thyroid Function Tests:  Recent Labs  12/02/16 0011  TSH 2.965   Anemia Panel: No results for input(s): VITAMINB12, FOLATE, FERRITIN, TIBC, IRON, RETICCTPCT in the last 72 hours. Sepsis Labs: No results for input(s): PROCALCITON, LATICACIDVEN in the last 168 hours.  No results found for this or any previous visit (from the past 240 hour(s)).     Radiology Studies: Dg Chest Port 1 View  Result Date: 12/02/2016 CLINICAL DATA:  Respiratory distress EXAM: PORTABLE CHEST 1 VIEW COMPARISON:  None. FINDINGS: Non inclusion of the extreme lung bases. There is mild cardiomegaly. Diffuse mild interstitial prominence could  relate to mild interstitial infiltrates or edema. Streaky atelectasis or infiltrate at the left base. No pneumothorax. IMPRESSION: 1. Cardiomegaly. 2. Mild interstitial opacities could reflect interstitial infiltrates or mild edema. Streaky atelectasis versus mild infiltrate at the left base. Electronically Signed   By: Jasmine Pang M.D.   On: 12/02/2016 01:47      Scheduled Meds: . atorvastatin  10 mg Oral q1800  . carbamazepine  200 mg Oral TID  . coumadin book   Does not apply Once  . diltiazem  90 mg Oral Q12H  . enoxaparin (LOVENOX) injection  1 mg/kg Subcutaneous Q12H  . metoprolol succinate  25 mg Oral BID  . pantoprazole  40 mg Oral Daily  . sodium chloride flush  3 mL Intravenous Q12H  . warfarin  2.5 mg Oral ONCE-1800  . warfarin   Does not apply  Once  . Warfarin - Pharmacist Dosing Inpatient   Does not apply q1800   Continuous Infusions: . amiodarone 30 mg/hr (12/03/16 0217)     LOS: 2 days    Time spent: 30 minutes   Karen Stain, DO [facility] [email or web address] Password [email or web address] 12/03/2016, 11:20 AM

## 2016-12-04 DIAGNOSIS — I2699 Other pulmonary embolism without acute cor pulmonale: Principal | ICD-10-CM

## 2016-12-04 LAB — PROTIME-INR
INR: 1.25
Prothrombin Time: 15.7 seconds — ABNORMAL HIGH (ref 11.4–15.2)

## 2016-12-04 LAB — BASIC METABOLIC PANEL
Anion gap: 7 (ref 5–15)
BUN: 14 mg/dL (ref 6–20)
CO2: 25 mmol/L (ref 22–32)
CREATININE: 1 mg/dL (ref 0.44–1.00)
Calcium: 8.5 mg/dL — ABNORMAL LOW (ref 8.9–10.3)
Chloride: 107 mmol/L (ref 101–111)
GFR calc Af Amer: >60 mL/min (ref >60)
GFR, EST NON AFRICAN AMERICAN: 52 mL/min — AB (ref >60)
Glucose, Bld: 104 mg/dL — ABNORMAL HIGH (ref 65–99)
Potassium: 4.3 mmol/L (ref 3.5–5.1)
Sodium: 139 mmol/L (ref 135–145)

## 2016-12-04 LAB — CBC
HCT: 33.7 % — ABNORMAL LOW (ref 36.0–46.0)
Hemoglobin: 11 g/dL — ABNORMAL LOW (ref 12.0–15.0)
MCH: 30.6 pg (ref 26.0–34.0)
MCHC: 32.6 g/dL (ref 30.0–36.0)
MCV: 93.9 fL (ref 78.0–100.0)
PLATELETS: 176 10*3/uL (ref 150–400)
RBC: 3.59 MIL/uL — ABNORMAL LOW (ref 3.87–5.11)
RDW: 15.2 % (ref 11.5–15.5)
WBC: 6.3 10*3/uL (ref 4.0–10.5)

## 2016-12-04 MED ORDER — WARFARIN SODIUM 2.5 MG PO TABS
2.5000 mg | ORAL_TABLET | ORAL | Status: AC
  Administered 2016-12-04: 2.5 mg via ORAL
  Filled 2016-12-04: qty 1

## 2016-12-04 MED ORDER — DIAZEPAM 2 MG PO TABS
2.0000 mg | ORAL_TABLET | Freq: Once | ORAL | Status: AC
  Administered 2016-12-04: 2 mg via ORAL
  Filled 2016-12-04: qty 1

## 2016-12-04 MED ORDER — METOPROLOL SUCCINATE ER 50 MG PO TB24
75.0000 mg | ORAL_TABLET | Freq: Two times a day (BID) | ORAL | Status: DC
  Administered 2016-12-04 – 2016-12-07 (×6): 75 mg via ORAL
  Filled 2016-12-04 (×7): qty 1

## 2016-12-04 MED ORDER — METOPROLOL SUCCINATE ER 100 MG PO TB24: 100.0000 mg | ORAL_TABLET | Freq: Two times a day (BID) | ORAL | Status: DC

## 2016-12-04 MED ORDER — TRAZODONE HCL 50 MG PO TABS
50.0000 mg | ORAL_TABLET | Freq: Every evening | ORAL | Status: DC | PRN
  Administered 2016-12-04: 50 mg via ORAL
  Filled 2016-12-04: qty 1

## 2016-12-04 NOTE — Care Management Important Message (Signed)
Important Message  Patient Details  Name: Karen Richardson MRN: [id number] Date of Birth: 05/06/1934   Medicare Important Message Given:  Yes    [name] Abena 12/04/2016, 1:42 PM

## 2016-12-04 NOTE — Progress Notes (Signed)
Patient Name: Karen Richardson Date of Encounter: 12/04/2016  Primary Cardiologist: [facility] Problem List     Principal Problem:   Atrial fibrillation with RVR [facility]) Active Problems:   Hypertension   Seizures (HCC)   Pulmonary emboli (HCC)   GERD (gastroesophageal reflux disease)     Subjective   She is very agitated currently sitting on the edge of the bed with her daughter feigning her. She had a rough night, no sleep. Ambien did not work. Usually Xanax works at home her daughter states. She seems restless. She states that everything feels bad. No significant change in her breathing, no chest pain that is new.  Inpatient Medications    Scheduled Meds: . amiodarone  400 mg Oral BID  . atorvastatin  10 mg Oral q1800  . carbamazepine  200 mg Oral TID  . diltiazem  90 mg Oral Q12H  . enoxaparin (LOVENOX) injection  1 mg/kg Subcutaneous Q12H  . metoprolol succinate  50 mg Oral BID  . pantoprazole  40 mg Oral Daily  . sodium chloride flush  3 mL Intravenous Q12H  . Warfarin - Pharmacist Dosing Inpatient   Does not apply q1800   Continuous Infusions:  PRN Meds: sodium chloride, acetaminophen, ALPRAZolam, meclizine, ondansetron (ZOFRAN) IV, sodium chloride flush   Vital Signs    Vitals:   12/03/16 1610 12/03/16 1805 12/03/16 2020 12/04/16 0543  BP: (!) 149/104 (!) 141/90 127/78 (!) 135/99  Pulse: (!) 137 (!) 105 (!) 112 (!) 115  Resp: 18  18 20   Temp: 97.6 F (36.4 C)  97.6 F (36.4 C) 98 F (36.7 C)  TempSrc: Oral  Oral Oral  SpO2: 94%  96% 95%  Weight:    193 lb 8 oz (87.8 kg)  Height:       No intake or output data in the 24 hours ending 12/04/16 1318 Filed Weights   12/02/16 0512 12/03/16 0600 12/04/16 0543  Weight: 195 lb 6.4 oz (88.6 kg) 196 lb (88.9 kg) 193 lb 8 oz (87.8 kg)    Physical Exam    GEN: Well nourished, well developed, Agitated.  HEENT: Grossly normal.  Neck: Supple, no JVD, carotid bruits, or masses.Bulging neck veins  noted Cardiac: Irregularly irregular, mildly tachycardic at times, 2/6 systolic murmur, no rubs, or gallops. No clubbing, cyanosis, edema.  Radials/DP/PT 2+ and equal bilaterally.  Respiratory:  Respirations regular and unlabored, clear to auscultation bilaterally. GI: Soft, nontender, nondistended, BS + x 4. MS: no deformity or atrophy. Skin: warm and dry, no rash. Neuro:  Strength and sensation are intact. Psych: AAOx3.  Agitated.  Labs    CBC  Recent Labs  12/02/16 0011  12/03/16 0615 12/04/16 0421  WBC 7.0  < > 7.8 6.3  NEUTROABS 4.3  --   --   --   HGB 11.2*  < > 11.5* 11.0*  HCT 35.5*  < > 35.9* 33.7*  MCV 96.2  < > 95.2 93.9  PLT 192  < > 167 176  < > = values in this interval not displayed. Basic Metabolic Panel  Recent Labs  12/02/16 0011  12/03/16 0615 12/04/16 0421  NA 141  < > 138 139  K 4.8  < > 4.6 4.3  CL 107  < > 107 107  CO2 23  < > 23 25  GLUCOSE 111*  < > 108* 104*  BUN 10  < > 15 14  CREATININE 0.91  < > 0.91 1.00  CALCIUM 8.6*  < >  8.6* 8.5*  MG 2.1  --   --   --   < > = values in this interval not displayed. Liver Function Tests  Recent Labs  12/02/16 0011  AST 45*  ALT 57*  ALKPHOS 83  BILITOT 0.5  PROT 6.1*  ALBUMIN 3.3*   No results for input(s): LIPASE, AMYLASE in the last 72 hours. Cardiac Enzymes  Recent Labs  12/02/16 0011  TROPONINI <0.03   BNP Invalid input(s): POCBNP D-Dimer No results for input(s): DDIMER in the last 72 hours. Hemoglobin A1C No results for input(s): HGBA1C in the last 72 hours. Fasting Lipid Panel No results for input(s): CHOL, HDL, LDLCALC, TRIG, CHOLHDL, LDLDIRECT in the last 72 hours. Thyroid Function Tests  Recent Labs  12/02/16 0011  TSH 2.965    Telemetry    Atrial fibrillation heart rate between 80 and 120 - Personally Reviewed  ECG    Atrial fibrillation - Personally Reviewed  Radiology    No results found.  Cardiac Studies   2D echo from [facility] showed moderate MR,  severe biatrial enlargement, normal LVF and severe TR with at least moderate pulmonary HTN.   Patient Profile     83 year old female, quite active previously on farm with bilateral pulmonary embolism, atrial fibrillation, tricuspid regurgitation, secondary pulmonary hypertension with seizure disorder on Tegretol  Assessment & Plan    Atrial fibrillation  - I will increase her metoprolol from 50 mg twice a day to 75 mg twice a day.  - She is also taking diltiazem 90 mg every 12 hours. We can increase this as well if necessary, as blood pressure allows.  - also on amiodarone 400 mg twice a day  - Likely secondary from PE  - watch for bradycardia.    Chronic anticoagulation  - Coumadin secondary to Tegretol use. Could not take DOAC  Bilateral pulmonary embolism  - Question etiology, she had times she may have minor leg injuries on the farm, tripped over something for instance. Perhaps this initiated the inflammatory cascade.  - Lovenox bridge with Coumadin.  - Explained at length to her daughter that this increases morbidity/mortality. She was under the impression that she was in need of heart surgery and that was the reason for transfer here. I explained her that heart surgery is not necessary in this situation.  Tricuspid regurgitation  - Severe, associated with moderate pulmonary hypertension, secondary from pulmonary embolism  Mitral regurgitation  - Moderate in severity  - Continue to treat atrial fibrillation, PE  Agitation/anxiety  - Her daughter states that usually Xanax works at home but she is quite agitated currently, sitting on edge of bed. Her daughter is feigning her trying to calm her down. She states that she just hurts all over. No specific. Hemodynamically she is currently stable.  Signed, Donato Schultz, MD  12/04/2016, 1:18 PM

## 2016-12-04 NOTE — Progress Notes (Signed)
ANTICOAGULATION CONSULT NOTE  Pharmacy Consult for Heparin > Lovenox/warfarin Indication: acute PE and afib  Allergies  Allergen Reactions  . Codeine Nausea Only    Patient Measurements: Height: 5\' 5"  (165.1 cm) Weight: 193 lb 8 oz (87.8 kg) IBW/kg (Calculated) : 57 Heparin Dosing Weight: 76 kg  Vital Signs: Temp: 98 F (36.7 C) (01/05 0543) Temp Source: Oral (01/05 0543) BP: 135/99 (01/05 0543) Pulse Rate: 115 (01/05 0543)  Assessment: 83 y.o. F transferred from [facility]. Eliquis 5mg  po BID started at [facility] for afib 12/30. CT showed acute PE so dose increased to 10mg  BID on 12/31 - last dose 1/2 a.m. Was transitioned to heparin per Pharmacy. Since pt on carbamazepine (can decrease apixaban concentration - not recommended to use together), Pharmacy consulted to transition to warfarin + Lovenox bridge on 1/4. Noted on new amiodarone.  -INR= 1.25 on day 2 of coumadin   Goal of Therapy:  Anti-Xa level 0.6-1 units/ml 4hrs after LMWH dose given Monitor platelets by anticoagulation protocol: Yes   Plan:  Lovenox 90mg  (~1mg /kg) [place] q12h - d/c when INR therapeutic >24h Warfarin 2.5mg  PO x 1 dose tonight Daily INR, CBC at least q72h while on Lovenox  Harland German, Pharm D 12/04/2016 10:12 AM

## 2016-12-04 NOTE — Progress Notes (Signed)
Pt's daughter reports that pt "feels bad and is acting weird." Pt readily responds to conversation and is oriented at baseline. She does reports being really tired due to lack of sleep and feeling of "heart racing all night". Provider present on unit and in for eval. Awaiting orders.

## 2016-12-04 NOTE — Care Management Note (Signed)
Case Management Note Donn Pierini RN, BSN Unit 2W-Case Manager [phone]  Patient Details  Name: Karen Richardson MRN: [id number] Date of Birth: 21-Feb-1934  Subjective/Objective:  Pt admitted as tx from [facility] with afib/PE                  Action/Plan: PTA pt lived at home- anticipate return home- per PT/OT evals- recommendations for [facility]- awaiting MD orders. CM to follow for d/c needs  Expected Discharge Date:                  Expected Discharge Plan:  Home w Home Health Services  In-House Referral:     Discharge planning Services  CM Consult  Post Acute Care Choice:  Home Health Choice offered to:     DME Arranged:    DME Agency:     HH Arranged:    HH Agency:     Status of Service:  In process, will continue to follow  If discussed at Long Length of Stay Meetings, dates discussed:    Additional Comments:  Karen Span, RN 12/04/2016, 4:04 PM

## 2016-12-04 NOTE — Progress Notes (Signed)
PROGRESS NOTE    Karen Richardson  ZOX:[id number] DOB: 1934-09-13 DOA: 12/01/2016 PCP: Karen Specking, MD     Brief Narrative:  MODELLE Richardson is a 83 y.o. female with medical history significant for hypertension and seizure disorder who presents in transfer from [facility] for management of atrial fibrillation with RVR. Patient states that she has been experiencing generalized weakness, fatigue with minimal daily activity. She then progressed to have exertional dyspnea, which prompted evaluation in the emergency department. On presentation to [facility] on 11/27/2016, she was in atrial fibrillation with RVR. She was initially treated with IV Cardizem. She was also given Rocephin and azithromycin for possible pneumonia. D-dimer was elevated and CT chest revealed right lower and middle lobe pulmonary emboli with evidence for heart strain. Patient continued to have rapid rates intermittently in the hospital and was eventually transferred to [facility] for cardiology evaluation.  Assessment & Plan:   Principal Problem:   Atrial fibrillation with RVR (HCC) Active Problems:   Hypertension   Seizures (HCC)   Pulmonary emboli (HCC)   GERD (gastroesophageal reflux disease)  Atrial fibrillation with RVR, new onset  - CHADSVASc 6 - Cardiology following  - Amiodarone, cardizem, metoprolol   - Due to interactions with tegretol, patient is started on coumadin/lovenox to bridge   Acute PE with right-heart strain  - Pt was initially admitted to hospital on 12/29 with suspected PNA  - D-dimer was elevated and CTA featured RLL and RML PEs with CT-evidence for right heart strain  - Due to interactions with tegretol, patient is started on coumadin/lovenox to bridge   Anxiety - Xanax did not help today. Will try valium.   Hypertension  - Continue Toprol and diltiazem  Questionable CAP - Pt was admitted to [facility] on 12/29 with suspected CAP and received 5 days treatment  with Rocephin and azithro prior to transfer here  Seizure disorder  - Continue Tegretol  GERD - Continue Protonix    DVT prophylaxis: lovenox/coumadin  Code Status: Full Family Communication: daughter, nephew  at bedside Disposition Plan: Home with [facility] PT OT when stable    Consultants:   Cardiology  Procedures:   None  Antimicrobials:   None     Subjective: Patient is very anxious today. She states that she did not sleep at all overnight. She tells me that she does not feel well but unable to provide any further history. Denies any chest pain. No shortness of breath at rest, although does get short of breath with ambulation. She tells me that she is very anxious but no other specific complaint    Objective: Vitals:   12/03/16 1610 12/03/16 1805 12/03/16 2020 12/04/16 0543  BP: (!) 149/104 (!) 141/90 127/78 (!) 135/99  Pulse: (!) 137 (!) 105 (!) 112 (!) 115  Resp: 18  18 20   Temp: 97.6 F (36.4 C)  97.6 F (36.4 C) 98 F (36.7 C)  TempSrc: Oral  Oral Oral  SpO2: 94%  96% 95%  Weight:    87.8 kg (193 lb 8 oz)  Height:       No intake or output data in the 24 hours ending 12/04/16 1359 Filed Weights   12/02/16 0512 12/03/16 0600 12/04/16 0543  Weight: 88.6 kg (195 lb 6.4 oz) 88.9 kg (196 lb) 87.8 kg (193 lb 8 oz)    Examination:  General exam: Appears anxious, sitting on edge of bed Respiratory system: Clear to auscultation. Respiratory effort normal. Cardiovascular system: S1 &  S2 heard, Irregular rhythm, rate 100s. No pedal edema. Gastrointestinal system: Abdomen is nondistended, soft and nontender. No organomegaly or masses felt. Normal bowel sounds heard. Central nervous system: Alert and oriented. No focal neurological deficits. Extremities: Symmetric 5 x 5 power. Skin: No rashes, lesions or ulcers  Data Reviewed: I have personally reviewed following labs and imaging studies  CBC:  Recent Labs Lab 12/02/16 0011 12/02/16 0958 12/03/16 0615  12/04/16 0421  WBC 7.0 7.2 7.8 6.3  NEUTROABS 4.3  --   --   --   HGB 11.2* 11.5* 11.5* 11.0*  HCT 35.5* 35.3* 35.9* 33.7*  MCV 96.2 94.9 95.2 93.9  PLT 192 173 167 176   Basic Metabolic Panel:  Recent Labs Lab 12/02/16 0011 12/02/16 0958 12/03/16 0615 12/04/16 0421  NA 141 140 138 139  K 4.8 4.3 4.6 4.3  CL 107 108 107 107  CO2 23 25 23 25   GLUCOSE 111* 106* 108* 104*  BUN 10 13 15 14   CREATININE 0.91 1.09* 0.91 1.00  CALCIUM 8.6* 8.6* 8.6* 8.5*  MG 2.1  --   --   --    GFR: Estimated Creatinine Clearance: 49.1 mL/min (by C-G formula based on SCr of 1 mg/dL). Liver Function Tests:  Recent Labs Lab 12/02/16 0011  AST 45*  ALT 57*  ALKPHOS 83  BILITOT 0.5  PROT 6.1*  ALBUMIN 3.3*   No results for input(s): LIPASE, AMYLASE in the last 168 hours. No results for input(s): AMMONIA in the last 168 hours. Coagulation Profile:  Recent Labs Lab 12/03/16 1104 12/04/16 0421  INR 1.15 1.25   Cardiac Enzymes:  Recent Labs Lab 12/02/16 0011  TROPONINI <0.03   BNP (last 3 results) No results for input(s): PROBNP in the last 8760 hours. HbA1C: No results for input(s): HGBA1C in the last 72 hours. CBG: No results for input(s): GLUCAP in the last 168 hours. Lipid Profile: No results for input(s): CHOL, HDL, LDLCALC, TRIG, CHOLHDL, LDLDIRECT in the last 72 hours. Thyroid Function Tests:  Recent Labs  12/02/16 0011  TSH 2.965   Anemia Panel: No results for input(s): VITAMINB12, FOLATE, FERRITIN, TIBC, IRON, RETICCTPCT in the last 72 hours. Sepsis Labs: No results for input(s): PROCALCITON, LATICACIDVEN in the last 168 hours.  No results found for this or any previous visit (from the past 240 hour(s)).     Radiology Studies: No results found.    Scheduled Meds: . amiodarone  400 mg Oral BID  . atorvastatin  10 mg Oral q1800  . carbamazepine  200 mg Oral TID  . diltiazem  90 mg Oral Q12H  . enoxaparin (LOVENOX) injection  1 mg/kg Subcutaneous Q12H   . metoprolol succinate  75 mg Oral BID  . pantoprazole  40 mg Oral Daily  . sodium chloride flush  3 mL Intravenous Q12H  . Warfarin - Pharmacist Dosing Inpatient   Does not apply q1800   Continuous Infusions:    LOS: 3 days    Time spent: 30 minutes   Noralee Stain, DO [facility] [email or web address] Password [email or web address] 12/04/2016, 1:59 PM

## 2016-12-05 ENCOUNTER — Inpatient Hospital Stay (HOSPITAL_COMMUNITY): Payer: Medicare Other

## 2016-12-05 LAB — BASIC METABOLIC PANEL
Anion gap: 9 (ref 5–15)
Anion gap: 11 (ref 5–15)
BUN: 18 mg/dL (ref 6–20)
BUN: 22 mg/dL — ABNORMAL HIGH (ref 6–20)
CO2: 22 mmol/L (ref 22–32)
CO2: 20 mmol/L — ABNORMAL LOW (ref 22–32)
CREATININE: 0.98 mg/dL (ref 0.44–1.00)
Calcium: 8.4 mg/dL — ABNORMAL LOW (ref 8.9–10.3)
Calcium: 8.6 mg/dL — ABNORMAL LOW (ref 8.9–10.3)
Chloride: 106 mmol/L (ref 101–111)
Chloride: 104 mmol/L (ref 101–111)
Creatinine, Ser: 1.22 mg/dL — ABNORMAL HIGH (ref 0.44–1.00)
GFR calc Af Amer: >60 mL/min (ref >60)
GFR calc Af Amer: 47 mL/min — ABNORMAL LOW (ref >60)
GFR calc non Af Amer: 41 mL/min — ABNORMAL LOW (ref >60)
GFR, EST NON AFRICAN AMERICAN: 53 mL/min — AB (ref >60)
GLUCOSE: 111 mg/dL — AB (ref 65–99)
Glucose, Bld: 131 mg/dL — ABNORMAL HIGH (ref 65–99)
POTASSIUM: 4.2 mmol/L (ref 3.5–5.1)
Potassium: 5.4 mmol/L — ABNORMAL HIGH (ref 3.5–5.1)
Sodium: 137 mmol/L (ref 135–145)
Sodium: 135 mmol/L (ref 135–145)

## 2016-12-05 LAB — PROTIME-INR
INR: 1.25
PROTHROMBIN TIME: 15.8 s — AB (ref 11.4–15.2)

## 2016-12-05 LAB — HEPATIC FUNCTION PANEL
ALBUMIN: 3.5 g/dL (ref 3.5–5.0)
ALK PHOS: 85 U/L (ref 38–126)
ALT: 33 U/L (ref 14–54)
AST: 30 U/L (ref 15–41)
BILIRUBIN DIRECT: 0.3 mg/dL (ref 0.1–0.5)
BILIRUBIN TOTAL: 0.6 mg/dL (ref 0.3–1.2)
Indirect Bilirubin: 0.3 mg/dL (ref 0.3–0.9)
Total Protein: 6.1 g/dL — ABNORMAL LOW (ref 6.5–8.1)

## 2016-12-05 LAB — LIPASE, BLOOD: Lipase: 22 U/L (ref 11–51)

## 2016-12-05 MED ORDER — SODIUM CHLORIDE 0.9 % IV BOLUS (SEPSIS)
250.0000 mL | Freq: Once | INTRAVENOUS | Status: AC
  Administered 2016-12-05: 250 mL via INTRAVENOUS

## 2016-12-05 MED ORDER — PROMETHAZINE HCL 25 MG/ML IJ SOLN
12.5000 mg | Freq: Once | INTRAMUSCULAR | Status: AC
  Administered 2016-12-05: 12.5 mg via INTRAVENOUS
  Filled 2016-12-05: qty 1

## 2016-12-05 MED ORDER — OFF THE BEAT BOOK
Freq: Once | Status: AC
  Administered 2016-12-05: 17:00:00
  Filled 2016-12-05: qty 1

## 2016-12-05 MED ORDER — DILTIAZEM HCL 60 MG PO TABS
60.0000 mg | ORAL_TABLET | Freq: Four times a day (QID) | ORAL | Status: DC
  Administered 2016-12-05 – 2016-12-06 (×2): 60 mg via ORAL
  Filled 2016-12-05 (×2): qty 1

## 2016-12-05 MED ORDER — WARFARIN SODIUM 5 MG PO TABS
5.0000 mg | ORAL_TABLET | Freq: Once | ORAL | Status: AC
  Administered 2016-12-05: 5 mg via ORAL
  Filled 2016-12-05: qty 1

## 2016-12-05 NOTE — Progress Notes (Signed)
PROGRESS NOTE    [name]  ONG:[id number] DOB: 1934/01/15 DOA: 12/01/2016 PCP: Ignatius Specking, MD     Brief Narrative:  Karen Richardson is a 83 y.o. female with medical history significant for hypertension and seizure disorder who presents in transfer from [facility] for management of atrial fibrillation with RVR. Patient states that she has been experiencing generalized weakness, fatigue with minimal daily activity. She then progressed to have exertional dyspnea, which prompted evaluation in the emergency department. On presentation to [facility] on 11/27/2016, she was in atrial fibrillation with RVR. She was initially treated with IV Cardizem. She was also given Rocephin and azithromycin for possible pneumonia. D-dimer was elevated and CT chest revealed right lower and middle lobe pulmonary emboli with evidence for heart strain. Patient continued to have rapid rates intermittently in the hospital and was eventually transferred to [facility] for cardiology evaluation.  Assessment & Plan:   Principal Problem:   Atrial fibrillation with RVR (HCC) Active Problems:   Hypertension   Seizures (HCC)   Pulmonary emboli (HCC)   GERD (gastroesophageal reflux disease)  Atrial fibrillation with RVR, new onset  - CHADSVASc 6 - Cardiology following  - Amiodarone, cardizem, metoprolol   - Due to interactions with tegretol, patient is started on coumadin/lovenox to bridge   Acute PE with right-heart strain  - Pt was initially admitted to hospital on 12/29 with suspected PNA  - D-dimer was elevated and CTA featured RLL and RML PEs with CT-evidence for right heart strain  - Due to interactions with tegretol, patient is started on coumadin/lovenox to bridge   Anxiety - Much better today after Valium  Hypertension  - Continue Toprol and diltiazem  Questionable CAP - Pt was admitted to [facility] on 12/29 with suspected CAP and received 5 days treatment with Rocephin  and azithro prior to transfer here  Seizure disorder  - Continue Tegretol. Spoke with family regarding switching her seizure medication. Encouraged them to follow up with PCP and neurology.  GERD - Continue Protonix    DVT prophylaxis: lovenox/coumadin  Code Status: Full Family Communication: daughters, husband at bedside.  Disposition Plan: family requesting re-evaluation by PT and SNF placement. SW consulted    Consultants:   Cardiology  Procedures:   None  Antimicrobials:   None     Subjective: Doing much better today. Was able to sleep 3-4 hours after valium, although couldn't sleep at night. No Cp, SOB on exam today. Continues to be weak.   Objective: Vitals:   12/05/16 0606 12/05/16 1017 12/05/16 1427 12/05/16 1443  BP: (!) 147/71 110/70 105/68 108/77  Pulse: 82 (!) 101 81 96  Resp: (!) 22   18  Temp: 98.8 F (37.1 C)   97.4 F (36.3 C)  TempSrc: Oral   Axillary  SpO2: 95%   98%  Weight: 68.2 kg (150 lb 5.7 oz)     Height:        Intake/Output Summary (Last 24 hours) at 12/05/16 1452 Last data filed at 12/05/16 0830  Gross per 24 hour  Intake              480 ml  Output                0 ml  Net              480 ml   Filed Weights   12/03/16 0600 12/04/16 0543 12/05/16 0606  Weight: 88.9 kg (196 lb) 87.8  kg (193 lb 8 oz) 68.2 kg (150 lb 5.7 oz)    Examination:  General exam: Calm, comfortable in bed  Respiratory system: Clear to auscultation. Respiratory effort normal. Cardiovascular system: S1 & S2 heard, Irregular rhythm, rate 100s. No pedal edema. Gastrointestinal system: Abdomen is nondistended, soft and nontender. No organomegaly or masses felt. Normal bowel sounds heard. Central nervous system: Alert and oriented. No focal neurological deficits. Extremities: Symmetric 5 x 5 power. Skin: No rashes, lesions or ulcers  Data Reviewed: I have personally reviewed following labs and imaging studies  CBC:  Recent Labs Lab 12/02/16 0011  12/02/16 0958 12/03/16 0615 12/04/16 0421  WBC 7.0 7.2 7.8 6.3  NEUTROABS 4.3  --   --   --   HGB 11.2* 11.5* 11.5* 11.0*  HCT 35.5* 35.3* 35.9* 33.7*  MCV 96.2 94.9 95.2 93.9  PLT 192 173 167 176   Basic Metabolic Panel:  Recent Labs Lab 12/02/16 0011 12/02/16 0958 12/03/16 0615 12/04/16 0421 12/05/16 0338  NA 141 140 138 139 137  K 4.8 4.3 4.6 4.3 4.2  CL 107 108 107 107 106  CO2 23 25 23 25 22   GLUCOSE 111* 106* 108* 104* 111*  BUN 10 13 15 14 18   CREATININE 0.91 1.09* 0.91 1.00 0.98  CALCIUM 8.6* 8.6* 8.6* 8.5* 8.4*  MG 2.1  --   --   --   --    GFR: Estimated Creatinine Clearance: 41.2 mL/min (by C-G formula based on SCr of 0.98 mg/dL). Liver Function Tests:  Recent Labs Lab 12/02/16 0011  AST 45*  ALT 57*  ALKPHOS 83  BILITOT 0.5  PROT 6.1*  ALBUMIN 3.3*   No results for input(s): LIPASE, AMYLASE in the last 168 hours. No results for input(s): AMMONIA in the last 168 hours. Coagulation Profile:  Recent Labs Lab 12/03/16 1104 12/04/16 0421 12/05/16 0338  INR 1.15 1.25 1.25   Cardiac Enzymes:  Recent Labs Lab 12/02/16 0011  TROPONINI <0.03   BNP (last 3 results) No results for input(s): PROBNP in the last 8760 hours. HbA1C: No results for input(s): HGBA1C in the last 72 hours. CBG: No results for input(s): GLUCAP in the last 168 hours. Lipid Profile: No results for input(s): CHOL, HDL, LDLCALC, TRIG, CHOLHDL, LDLDIRECT in the last 72 hours. Thyroid Function Tests: No results for input(s): TSH, T4TOTAL, FREET4, T3FREE, THYROIDAB in the last 72 hours. Anemia Panel: No results for input(s): VITAMINB12, FOLATE, FERRITIN, TIBC, IRON, RETICCTPCT in the last 72 hours. Sepsis Labs: No results for input(s): PROCALCITON, LATICACIDVEN in the last 168 hours.  No results found for this or any previous visit (from the past 240 hour(s)).     Radiology Studies: No results found.    Scheduled Meds: . amiodarone  400 mg Oral BID  .  atorvastatin  10 mg Oral q1800  . carbamazepine  200 mg Oral TID  . diltiazem  60 mg Oral Q6H  . enoxaparin (LOVENOX) injection  1 mg/kg Subcutaneous Q12H  . metoprolol succinate  75 mg Oral BID  . off the beat book   Does not apply Once  . pantoprazole  40 mg Oral Daily  . sodium chloride flush  3 mL Intravenous Q12H  . warfarin  5 mg Oral ONCE-1800  . Warfarin - Pharmacist Dosing Inpatient   Does not apply q1800   Continuous Infusions:    LOS: 4 days    Time spent: 30 minutes   Noralee Stain, DO [facility] [email or web address] Password [facility] 12/05/2016,  2:52 PM

## 2016-12-05 NOTE — Progress Notes (Addendum)
ANTICOAGULATION CONSULT NOTE  Pharmacy Consult for Heparin > Lovenox/warfarin Indication: acute PE and afib  Allergies  Allergen Reactions  . Codeine Nausea Only    Patient Measurements: Height: 5\' 5"  (165.1 cm) Weight: 150 lb 5.7 oz (68.2 kg) IBW/kg (Calculated) : 57 Heparin Dosing Weight: 76 kg  Vital Signs: Temp: 98.8 F (37.1 C) (01/06 0606) Temp Source: Oral (01/06 0606) BP: 110/70 (01/06 1017) Pulse Rate: 101 (01/06 1017)  Assessment: 83 y.o. F transferred from [facility]. Eliquis 5mg  po BID started at [facility] for afib 12/30. CT showed acute PE so dose increased to 10mg  BID on 12/31 - last dose 1/2 a.m. Was transitioned to heparin per Pharmacy. Since pt on carbamazepine (can decrease apixaban concentration - not recommended to use together), Pharmacy consulted to transition to warfarin + Lovenox bridge on 1/4. Noted on new amiodarone. Patient reports she has "always been an easy bleeder." Will monitor closely. No bleeding issues reported this admit.  -INR= 1.25 stable on day 3 of coumadin. CBC stable.   Goal of Therapy:  Anti-Xa level 0.6-1 units/ml 4hrs after LMWH dose given Monitor platelets by anticoagulation protocol: Yes   Plan:  Lovenox 90mg  (~1mg /kg) [place] q12h - d/c when INR therapeutic >24h Warfarin 5mg  PO x 1 dose tonight Daily INR, CBC at least q72h while on Lovenox   Babs Bertin, PharmD, BCPS Clinical Pharmacist 12/05/2016 10:34 AM

## 2016-12-05 NOTE — Progress Notes (Signed)
Patient Name: Karen Richardson Date of Encounter: 12/05/2016  Primary Cardiologist: [facility] Problem List     Principal Problem:   Atrial fibrillation with RVR [facility]) Active Problems:   Hypertension   Seizures (HCC)   Pulmonary emboli (HCC)   GERD (gastroesophageal reflux disease)     Subjective    No significant change in her breathing, no chest pain that is new.  Inpatient Medications    Scheduled Meds: . amiodarone  400 mg Oral BID  . atorvastatin  10 mg Oral q1800  . carbamazepine  200 mg Oral TID  . diltiazem  90 mg Oral Q12H  . enoxaparin (LOVENOX) injection  1 mg/kg Subcutaneous Q12H  . metoprolol succinate  75 mg Oral BID  . off the beat book   Does not apply Once  . pantoprazole  40 mg Oral Daily  . sodium chloride flush  3 mL Intravenous Q12H  . Warfarin - Pharmacist Dosing Inpatient   Does not apply q1800   Continuous Infusions:  PRN Meds: sodium chloride, acetaminophen, ALPRAZolam, meclizine, ondansetron (ZOFRAN) IV, sodium chloride flush, traZODone   Vital Signs    Vitals:   12/04/16 0543 12/04/16 2024 12/05/16 0352 12/05/16 0606  BP: (!) 135/99 128/87 (!) 129/94 (!) 147/71  Pulse: (!) 115 (!) 110 91 82  Resp: 20 18 (!) 24 (!) 22  Temp: 98 F (36.7 C) 97.7 F (36.5 C) 97.3 F (36.3 C) 98.8 F (37.1 C)  TempSrc: Oral Oral Oral Oral  SpO2: 95% 100% 96% 95%  Weight: 193 lb 8 oz (87.8 kg)   150 lb 5.7 oz (68.2 kg)  Height:        Intake/Output Summary (Last 24 hours) at 12/05/16 1009 Last data filed at 12/04/16 1630  Gross per 24 hour  Intake              240 ml  Output                0 ml  Net              240 ml   Filed Weights   12/03/16 0600 12/04/16 0543 12/05/16 0606  Weight: 196 lb (88.9 kg) 193 lb 8 oz (87.8 kg) 150 lb 5.7 oz (68.2 kg)    Physical Exam    GEN: Well nourished, well developed, Agitated.  HEENT: Grossly normal.  Neck: Supple, no JVD, carotid bruits, or masses.Bulging neck veins noted Cardiac:  Irregularly irregular, mildly tachycardic at times, 2/6 systolic murmur, no rubs, or gallops. No clubbing, cyanosis, edema.  Radials/DP/PT 2+ and equal bilaterally.  Respiratory:  Respirations regular and unlabored, clear to auscultation bilaterally. GI: Soft, nontender, nondistended, BS + x 4. MS: no deformity or atrophy. Skin: warm and dry, no rash. Neuro:  Strength and sensation are intact. Psych: AAOx3.  Agitated.  Labs    CBC  Recent Labs  12/03/16 0615 12/04/16 0421  WBC 7.8 6.3  HGB 11.5* 11.0*  HCT 35.9* 33.7*  MCV 95.2 93.9  PLT 167 176   Basic Metabolic Panel  Recent Labs  12/04/16 0421 12/05/16 0338  NA 139 137  K 4.3 4.2  CL 107 106  CO2 25 22  GLUCOSE 104* 111*  BUN 14 18  CREATININE 1.00 0.98  CALCIUM 8.5* 8.4*   Liver Function Tests No results for input(s): AST, ALT, ALKPHOS, BILITOT, PROT, ALBUMIN in the last 72 hours. No results for input(s): LIPASE, AMYLASE in the last 72 hours. Cardiac Enzymes  No results for input(s): CKTOTAL, CKMB, CKMBINDEX, TROPONINI in the last 72 hours. BNP Invalid input(s): POCBNP D-Dimer No results for input(s): DDIMER in the last 72 hours. Hemoglobin A1C No results for input(s): HGBA1C in the last 72 hours. Fasting Lipid Panel No results for input(s): CHOL, HDL, LDLCALC, TRIG, CHOLHDL, LDLDIRECT in the last 72 hours. Thyroid Function Tests No results for input(s): TSH, T4TOTAL, T3FREE, THYROIDAB in the last 72 hours.  Invalid input(s): FREET3  Telemetry    Atrial fibrillation heart rate between 80 and 120 - Personally Reviewed  ECG    Atrial fibrillation - Personally Reviewed  Radiology    No results found.  Cardiac Studies   2D echo from [facility] showed moderate MR, severe biatrial enlargement, normal LVF and severe TR with at least moderate pulmonary HTN.   Patient Profile     83 year old female, quite active previously on farm with bilateral pulmonary embolism, atrial fibrillation, tricuspid  regurgitation, secondary pulmonary hypertension with seizure disorder on Tegretol  Assessment & Plan    Atrial fibrillation - likely secondary to acute PE  - HR still poorly controlled.    - Continue Lopressor 75 mg twice a day and increase diltiazem to 60mg  q6 hours.  - Continue  amiodarone 400 mg twice a day  Chronic anticoagulation  - CHADS2VASC score 6  - Coumadin with Lovenox bridge. Could not use DOAC secondary to interaction with Tegretol  Bilateral pulmonary embolism  - Question etiology, she had times she may have minor leg injuries on the farm, tripped over something for instance. Perhaps this initiated the inflammatory cascade.  - Lovenox bridge with Coumadin.  INR 1.25.  Tricuspid regurgitation  - Severe, associated with moderate pulmonary hypertension, secondary from pulmonary embolism  Mitral regurgitation  - Moderate in severity  - Continue to treat atrial fibrillation, PE   Signed, Armanda Magic, MD  12/05/2016, 10:09 AM

## 2016-12-06 LAB — BASIC METABOLIC PANEL
ANION GAP: 10 (ref 5–15)
BUN: 23 mg/dL — ABNORMAL HIGH (ref 6–20)
CO2: 22 mmol/L (ref 22–32)
Calcium: 8.3 mg/dL — ABNORMAL LOW (ref 8.9–10.3)
Chloride: 107 mmol/L (ref 101–111)
Creatinine, Ser: 1.21 mg/dL — ABNORMAL HIGH (ref 0.44–1.00)
GFR, EST AFRICAN AMERICAN: 48 mL/min — AB (ref >60)
GFR, EST NON AFRICAN AMERICAN: 41 mL/min — AB (ref >60)
GLUCOSE: 108 mg/dL — AB (ref 65–99)
POTASSIUM: 4.7 mmol/L (ref 3.5–5.1)
Sodium: 139 mmol/L (ref 135–145)

## 2016-12-06 LAB — BLOOD GAS, ARTERIAL
Acid-Base Excess: 0.3 mmol/L (ref 0.0–2.0)
Bicarbonate: 25 mmol/L (ref 20.0–28.0)
Drawn by: 437071
O2 CONTENT: 2 L/min
O2 SAT: 93.3 %
PCO2 ART: 45.2 mmHg (ref 32.0–48.0)
PO2 ART: 76.4 mmHg — AB (ref 83.0–108.0)
Patient temperature: 98.6
pH, Arterial: 7.362 (ref 7.350–7.450)

## 2016-12-06 LAB — PROTIME-INR
INR: 1.38
PROTHROMBIN TIME: 17.1 s — AB (ref 11.4–15.2)

## 2016-12-06 MED ORDER — SODIUM CHLORIDE 0.9 % IV SOLN
INTRAVENOUS | Status: DC
  Administered 2016-12-06 – 2016-12-08 (×7): via INTRAVENOUS

## 2016-12-06 MED ORDER — WARFARIN SODIUM 7.5 MG PO TABS
7.5000 mg | ORAL_TABLET | Freq: Once | ORAL | Status: AC
  Administered 2016-12-06: 7.5 mg via ORAL
  Filled 2016-12-06: qty 1

## 2016-12-06 MED ORDER — POLYETHYLENE GLYCOL 3350 17 G PO PACK
17.0000 g | PACK | Freq: Every day | ORAL | Status: DC
  Filled 2016-12-06 (×3): qty 1

## 2016-12-06 MED ORDER — DILTIAZEM HCL 60 MG PO TABS
90.0000 mg | ORAL_TABLET | Freq: Four times a day (QID) | ORAL | Status: DC
  Administered 2016-12-06 – 2016-12-07 (×4): 90 mg via ORAL
  Filled 2016-12-06 (×6): qty 1

## 2016-12-06 MED ORDER — DOCUSATE SODIUM 100 MG PO CAPS
100.0000 mg | ORAL_CAPSULE | Freq: Every day | ORAL | Status: DC
  Administered 2016-12-06 – 2016-12-14 (×7): 100 mg via ORAL
  Filled 2016-12-06 (×7): qty 1

## 2016-12-06 NOTE — Progress Notes (Addendum)
Called by RN about patient experiencing altered mental status, patient is admitted for treatment of PEs, AFIB, and PNA.  Earlier patient was experiencing nausea and vomiting, RN had taken patient down for ABD xray, patient was given 12.5 Phenergan IV.  Per nursing patient is experiencing some delirium, impulsive, pulling at lines. Per RN VS stable and patient does awake but is sleepy from phenergan.   I was with another patient, so RN was instructed to call MD and update on patient's status.  I suggested the patient not receive Phenergan for nausea/vomiting, considering the patient's age and if it was to be given again, to give a reduced dose of 6.25.   RN to call MD, will assess patient when and if possible  Note: baseline patient has anxiety and a seizure disorder and patient was given XANAX 0.25 PRN for anxiety at 2132  Follow up call to RN at 0235, patient resting comfortably, ABG was ordered by MD.

## 2016-12-06 NOTE — Progress Notes (Signed)
Pt resting.  No further s/s of distress.  Will cont to monitor.

## 2016-12-06 NOTE — Progress Notes (Signed)
Paged attending coverage notifying of Pt AMS.  Order received.  Will cont to monitor.

## 2016-12-06 NOTE — Progress Notes (Signed)
ANTICOAGULATION CONSULT NOTE  Pharmacy Consult for Heparin > Lovenox/warfarin Indication: acute PE and afib  Allergies  Allergen Reactions  . Codeine Nausea Only    Patient Measurements: Height: 5\' 5"  (165.1 cm) Weight:  (Bed weight inaccurate-Pt unsteady at this time) IBW/kg (Calculated) : 57 Heparin Dosing Weight: 76 kg  Vital Signs: Temp: 97.6 F (36.4 C) (01/07 0618) BP: 140/87 (01/07 1012) Pulse Rate: 92 (01/07 1012)  Assessment: 83 y.o. F transferred from [facility]. Eliquis 5mg  po BID started at [facility] for afib 12/30. CT showed acute PE so dose increased to 10mg  BID on 12/31 - last dose 1/2 a.m. Was transitioned to heparin per Pharmacy. Since pt on carbamazepine (can decrease apixaban concentration - not recommended to use together), Pharmacy consulted to transition to warfarin + Lovenox bridge on 1/4.   Noted on new amiodarone. Patient reports she has "always been an easy bleeder." Will monitor closely. No bleeding issues reported this admit.  -INR up 1.38 on day 4 of coumadin. CBC stable. Noted weight change in the chart today ~20kg less than previous. Per discussion with RN, this is probably a mistake, will get another weight to confirm today. Keep Lovenox the same dose for now.   Goal of Therapy:  Anti-Xa level 0.6-1 units/ml 4hrs after LMWH dose given  INR 2-3 Monitor platelets by anticoagulation protocol: Yes   Plan:  Lovenox to 90mg  (~1mg /kg) Bee q12h - d/c when INR therapeutic >24h Warfarin 7.5mg  PO x 1 dose tonight Daily INR, CBC at least q72h while on Lovenox   Babs Bertin, PharmD, BCPS Clinical Pharmacist 12/06/2016 10:42 AM

## 2016-12-06 NOTE — Progress Notes (Signed)
Patient Name: Karen Richardson Date of Encounter: 12/06/2016  Primary Cardiologist: [facility] Problem List     Principal Problem:   Atrial fibrillation with RVR [facility]) Active Problems:   Hypertension   Seizures (HCC)   Pulmonary emboli (HCC)   GERD (gastroesophageal reflux disease)     Subjective    No significant change in her breathing, no chest pain.  Very sleepy this am.  Inpatient Medications    Scheduled Meds: . amiodarone  400 mg Oral BID  . atorvastatin  10 mg Oral q1800  . carbamazepine  200 mg Oral TID  . diltiazem  60 mg Oral Q6H  . enoxaparin (LOVENOX) injection  1 mg/kg Subcutaneous Q12H  . metoprolol succinate  75 mg Oral BID  . pantoprazole  40 mg Oral Daily  . sodium chloride flush  3 mL Intravenous Q12H  . Warfarin - Pharmacist Dosing Inpatient   Does not apply q1800   Continuous Infusions:  PRN Meds: sodium chloride, acetaminophen, ALPRAZolam, meclizine, ondansetron (ZOFRAN) IV, sodium chloride flush, traZODone   Vital Signs    Vitals:   12/05/16 1427 12/05/16 1443 12/05/16 2154 12/06/16 0618  BP: 105/68 108/77 134/65 (!) 131/94  Pulse: 81 96 88 99  Resp:  18 18 16   Temp:  97.4 F (36.3 C) 98 F (36.7 C) 97.6 F (36.4 C)  TempSrc:  Axillary Oral   SpO2:  98% 91% 98%  Weight:      Height:        Intake/Output Summary (Last 24 hours) at 12/06/16 0955 Last data filed at 12/05/16 1934  Gross per 24 hour  Intake              220 ml  Output                0 ml  Net              220 ml   Filed Weights   12/03/16 0600 12/04/16 0543 12/05/16 0606  Weight: 196 lb (88.9 kg) 193 lb 8 oz (87.8 kg) 150 lb 5.7 oz (68.2 kg)    Physical Exam    GEN: Well nourished, well developed, Agitated.  HEENT: Grossly normal.  Neck: Supple, no JVD, carotid bruits, or masses.Bulging neck veins noted Cardiac: Irregularly irregular, mildly tachycardic at times, 2/6 systolic murmur, no rubs, or gallops. No clubbing, cyanosis, edema.  Radials/DP/PT 2+  and equal bilaterally.  Respiratory:  Respirations regular and unlabored, clear to auscultation bilaterally. GI: Soft, nontender, nondistended, BS + x 4. MS: no deformity or atrophy. Skin: warm and dry, no rash. Neuro:  Strength and sensation are intact. Psych: AAOx3.  Agitated.  Labs    CBC  Recent Labs  12/04/16 0421  WBC 6.3  HGB 11.0*  HCT 33.7*  MCV 93.9  PLT 176   Basic Metabolic Panel  Recent Labs  12/05/16 2054 12/06/16 0321  NA 135 139  K 5.4* 4.7  CL 104 107  CO2 20* 22  GLUCOSE 131* 108*  BUN 22* 23*  CREATININE 1.22* 1.21*  CALCIUM 8.6* 8.3*   Liver Function Tests  Recent Labs  12/05/16 2054  AST 30  ALT 33  ALKPHOS 85  BILITOT 0.6  PROT 6.1*  ALBUMIN 3.5    Recent Labs  12/05/16 2054  LIPASE 22   Cardiac Enzymes No results for input(s): CKTOTAL, CKMB, CKMBINDEX, TROPONINI in the last 72 hours. BNP Invalid input(s): POCBNP D-Dimer No results for input(s): DDIMER in the  last 72 hours. Hemoglobin A1C No results for input(s): HGBA1C in the last 72 hours. Fasting Lipid Panel No results for input(s): CHOL, HDL, LDLCALC, TRIG, CHOLHDL, LDLDIRECT in the last 72 hours. Thyroid Function Tests No results for input(s): TSH, T4TOTAL, T3FREE, THYROIDAB in the last 72 hours.  Invalid input(s): FREET3  Telemetry    Atrial fibrillation heart rate between 80 and 120 - Personally Reviewed  ECG    Atrial fibrillation - Personally Reviewed  Radiology    Dg Abd Acute W/chest  Result Date: 12/05/2016 CLINICAL DATA:  Abdominal pain.  Hypertension. EXAM: DG ABDOMEN ACUTE W/ 1V CHEST COMPARISON:  12/01/2016 FINDINGS: There is no evidence of dilated bowel loops or free intraperitoneal air. No radiopaque calculi or other significant radiographic abnormality is seen. Unchanged cardiomegaly. Mild linear left base opacities may represent scarring. IMPRESSION: Negative abdominal radiographs.  No acute cardiopulmonary disease. Electronically Signed   By:  Ellery Plunk M.D.   On: 12/05/2016 23:58    Cardiac Studies   2D echo from [facility] showed moderate MR, severe biatrial enlargement, normal LVF and severe TR with at least moderate pulmonary HTN.   Patient Profile     83 year old female, quite active previously on farm with bilateral pulmonary embolism, atrial fibrillation, tricuspid regurgitation, secondary pulmonary hypertension with seizure disorder on Tegretol  Assessment & Plan    Atrial fibrillation - likely secondary to acute PE  - HR improved but still in low 100's  - Continue Lopressor 75 mg twice a day and increase diltiazem to 90mg  q6 hours.  - Continue  amiodarone 400 mg twice a day for rate control  Chronic anticoagulation  - CHADS2VASC score 6  - Coumadin with Lovenox bridge. Could not use DOAC secondary to interaction with Tegretol.  INR 1.38.  Bilateral pulmonary embolism  - Question etiology, she had times she may have minor leg injuries on the farm, tripped over something for instance. Perhaps this initiated the inflammatory cascade.  - Lovenox bridge with Coumadin.  INR 1.25.  Tricuspid regurgitation  - Severe, associated with moderate pulmonary hypertension, secondary from pulmonary embolism  Mitral regurgitation  - Moderate in severity  - Continue to treat atrial fibrillation, PE   Signed, Armanda Magic, MD  12/06/2016, 9:55 AM

## 2016-12-06 NOTE — Progress Notes (Addendum)
PROGRESS NOTE    Karen Richardson  HKG:[id number] DOB: Jul 25, 1934 DOA: 12/01/2016 PCP: Ignatius Specking, MD     Brief Narrative:  Karen Richardson is a 83 y.o. female with medical history significant for hypertension and seizure disorder who presents in transfer from [facility] for management of atrial fibrillation with RVR. Patient states that she has been experiencing generalized weakness, fatigue with minimal daily activity. She then progressed to have exertional dyspnea, which prompted evaluation in the emergency department. On presentation to [facility] on 11/27/2016, she was in atrial fibrillation with RVR. She was initially treated with IV Cardizem. She was also given Rocephin and azithromycin for possible pneumonia. D-dimer was elevated and CT chest revealed right lower and middle lobe pulmonary emboli with evidence for heart strain. Patient continued to have rapid rates intermittently in the hospital and was eventually transferred to [facility] for cardiology evaluation.  Assessment & Plan:   Principal Problem:   Atrial fibrillation with RVR (HCC) Active Problems:   Hypertension   Seizures (HCC)   Pulmonary emboli (HCC)   GERD (gastroesophageal reflux disease)  Atrial fibrillation with RVR, new onset  - CHADSVASc 6 - Cardiology following  - Amiodarone, cardizem, metoprolol   - Due to interactions with tegretol, patient is started on coumadin/lovenox to bridge   Acute PE with right-heart strain  - Pt was initially admitted to hospital on 12/29 with suspected PNA  - D-dimer was elevated and CTA featured RLL and RML PEs with CT-evidence for right heart strain  - Due to interactions with tegretol, patient is started on coumadin/lovenox to bridge   Acute encephalopathy - Suspect due to sundowning, benzo use, phenergan, dehydration - Stop all meds that could contribute to lethargy and confusion  Poor PO intake - IVF - Nutrition eval   Nausea/vomiting -  Suspect secondary to constipation. Has had small, hard stools. AXR unremarkable, LFT, lipase normal  - Improved this morning   Hypertension  - Continue Toprol and diltiazem  Questionable CAP - Pt was admitted to [facility] on 12/29 with suspected CAP and received 5 days treatment with Rocephin and azithro prior to transfer here  Seizure disorder  - Continue Tegretol. Spoke with family regarding switching her seizure medication. Encouraged them to follow up with PCP and neurology.  GERD - Continue Protonix    DVT prophylaxis: lovenox/coumadin  Code Status: Full Family Communication: daughter at bedside.  Disposition Plan: family requesting re-evaluation by PT and SNF placement. SW consulted    Consultants:   Cardiology  Procedures:   None  Antimicrobials:   None     Subjective: Overnight events reviewed. Currently no longer nauseous or vomiting. Denies any chest pain, shortness of breath or abdominal pain. Continues to be very lethargic this morning.  Objective: Vitals:   12/05/16 2154 12/06/16 0618 12/06/16 1012 12/06/16 1056  BP: 134/65 (!) 131/94 140/87   Pulse: 88 99 92   Resp: 18 16    Temp: 98 F (36.7 C) 97.6 F (36.4 C)    TempSrc: Oral     SpO2: 91% 98%    Weight:    91.2 kg (201 lb 1 oz)  Height:        Intake/Output Summary (Last 24 hours) at 12/06/16 1145 Last data filed at 12/06/16 1000  Gross per 24 hour  Intake              223 ml  Output  0 ml  Net              223 ml   Filed Weights   12/04/16 0543 12/05/16 0606 12/06/16 1056  Weight: 87.8 kg (193 lb 8 oz) 68.2 kg (150 lb 5.7 oz) 91.2 kg (201 lb 1 oz)    Examination:  General exam: Calm, comfortable in bed, lethargic, sleepy  Respiratory system: Clear to auscultation. Respiratory effort normal. Cardiovascular system: S1 & S2 heard, Irregular rhythm, rate 90s. No pedal edema. Gastrointestinal system: Abdomen is nondistended, soft and nontender. No organomegaly or masses  felt. Normal bowel sounds heard. Central nervous system: Alert and oriented. No focal neurological deficits. Extremities: Symmetric 5 x 5 power. Skin: No rashes, lesions or ulcers  Data Reviewed: I have personally reviewed following labs and imaging studies  CBC:  Recent Labs Lab 12/02/16 0011 12/02/16 0958 12/03/16 0615 12/04/16 0421  WBC 7.0 7.2 7.8 6.3  NEUTROABS 4.3  --   --   --   HGB 11.2* 11.5* 11.5* 11.0*  HCT 35.5* 35.3* 35.9* 33.7*  MCV 96.2 94.9 95.2 93.9  PLT 192 173 167 176   Basic Metabolic Panel:  Recent Labs Lab 12/02/16 0011  12/03/16 0615 12/04/16 0421 12/05/16 0338 12/05/16 2054 12/06/16 0321  NA 141  < > 138 139 137 135 139  K 4.8  < > 4.6 4.3 4.2 5.4* 4.7  CL 107  < > 107 107 106 104 107  CO2 23  < > 23 25 22  20* 22  GLUCOSE 111*  < > 108* 104* 111* 131* 108*  BUN 10  < > 15 14 18  22* 23*  CREATININE 0.91  < > 0.91 1.00 0.98 1.22* 1.21*  CALCIUM 8.6*  < > 8.6* 8.5* 8.4* 8.6* 8.3*  MG 2.1  --   --   --   --   --   --   < > = values in this interval not displayed. GFR: Estimated Creatinine Clearance: 41.4 mL/min (by C-G formula based on SCr of 1.21 mg/dL (H)). Liver Function Tests:  Recent Labs Lab 12/02/16 0011 12/05/16 2054  AST 45* 30  ALT 57* 33  ALKPHOS 83 85  BILITOT 0.5 0.6  PROT 6.1* 6.1*  ALBUMIN 3.3* 3.5    Recent Labs Lab 12/05/16 2054  LIPASE 22   No results for input(s): AMMONIA in the last 168 hours. Coagulation Profile:  Recent Labs Lab 12/03/16 1104 12/04/16 0421 12/05/16 0338 12/06/16 0321  INR 1.15 1.25 1.25 1.38   Cardiac Enzymes:  Recent Labs Lab 12/02/16 0011  TROPONINI <0.03   BNP (last 3 results) No results for input(s): PROBNP in the last 8760 hours. HbA1C: No results for input(s): HGBA1C in the last 72 hours. CBG: No results for input(s): GLUCAP in the last 168 hours. Lipid Profile: No results for input(s): CHOL, HDL, LDLCALC, TRIG, CHOLHDL, LDLDIRECT in the last 72 hours. Thyroid  Function Tests: No results for input(s): TSH, T4TOTAL, FREET4, T3FREE, THYROIDAB in the last 72 hours. Anemia Panel: No results for input(s): VITAMINB12, FOLATE, FERRITIN, TIBC, IRON, RETICCTPCT in the last 72 hours. Sepsis Labs: No results for input(s): PROCALCITON, LATICACIDVEN in the last 168 hours.  No results found for this or any previous visit (from the past 240 hour(s)).     Radiology Studies: Dg Abd Acute W/chest  Result Date: 12/05/2016 CLINICAL DATA:  Abdominal pain.  Hypertension. EXAM: DG ABDOMEN ACUTE W/ 1V CHEST COMPARISON:  12/01/2016 FINDINGS: There is no evidence of dilated bowel loops  or free intraperitoneal air. No radiopaque calculi or other significant radiographic abnormality is seen. Unchanged cardiomegaly. Mild linear left base opacities may represent scarring. IMPRESSION: Negative abdominal radiographs.  No acute cardiopulmonary disease. Electronically Signed   By: Ellery Plunk M.D.   On: 12/05/2016 23:58      Scheduled Meds: . amiodarone  400 mg Oral BID  . atorvastatin  10 mg Oral q1800  . carbamazepine  200 mg Oral TID  . diltiazem  90 mg Oral Q6H  . docusate sodium  100 mg Oral Daily  . enoxaparin (LOVENOX) injection  1 mg/kg Subcutaneous Q12H  . metoprolol succinate  75 mg Oral BID  . pantoprazole  40 mg Oral Daily  . polyethylene glycol  17 g Oral Daily  . sodium chloride flush  3 mL Intravenous Q12H  . warfarin  7.5 mg Oral ONCE-1800  . Warfarin - Pharmacist Dosing Inpatient   Does not apply q1800   Continuous Infusions: . sodium chloride 100 mL/hr at 12/06/16 1020     LOS: 5 days    Time spent: 30 minutes   Noralee Stain, DO [facility] [email or web address] Password [email or web address] 12/06/2016, 11:45 AM

## 2016-12-06 NOTE — Progress Notes (Signed)
Alerted to Pt c/o nausea at beginning of shift.  Zofran IV given @ 1951.  Alerted that Pt was vomiting @ 2030.  Assisted family to comfort Pt.  When Pt stable, sent message to attending to notify of Pt vomiting and zofran ineffective.  New orders received.  Will cont to monitor.

## 2016-12-07 LAB — CBC
HEMATOCRIT: 36.8 % (ref 36.0–46.0)
Hemoglobin: 11.7 g/dL — ABNORMAL LOW (ref 12.0–15.0)
MCH: 30.5 pg (ref 26.0–34.0)
MCHC: 31.8 g/dL (ref 30.0–36.0)
MCV: 96.1 fL (ref 78.0–100.0)
Platelets: 186 10*3/uL (ref 150–400)
RBC: 3.83 MIL/uL — ABNORMAL LOW (ref 3.87–5.11)
RDW: 15.7 % — AB (ref 11.5–15.5)
WBC: 7.4 10*3/uL (ref 4.0–10.5)

## 2016-12-07 LAB — BASIC METABOLIC PANEL
Anion gap: 8 (ref 5–15)
BUN: 24 mg/dL — AB (ref 6–20)
CO2: 22 mmol/L (ref 22–32)
Calcium: 8.1 mg/dL — ABNORMAL LOW (ref 8.9–10.3)
Chloride: 109 mmol/L (ref 101–111)
Creatinine, Ser: 1.31 mg/dL — ABNORMAL HIGH (ref 0.44–1.00)
GFR calc Af Amer: 43 mL/min — ABNORMAL LOW (ref >60)
GFR, EST NON AFRICAN AMERICAN: 37 mL/min — AB (ref >60)
GLUCOSE: 117 mg/dL — AB (ref 65–99)
POTASSIUM: 5 mmol/L (ref 3.5–5.1)
Sodium: 139 mmol/L (ref 135–145)

## 2016-12-07 LAB — TROPONIN I: Troponin I: <0.03 ng/mL (ref <0.03)

## 2016-12-07 LAB — PROTIME-INR
INR: 2.17
Prothrombin Time: 24.5 seconds — ABNORMAL HIGH (ref 11.4–15.2)

## 2016-12-07 MED ORDER — LORAZEPAM 2 MG/ML IJ SOLN
0.5000 mg | Freq: Once | INTRAMUSCULAR | Status: DC
Start: 2016-12-07 — End: 2016-12-13

## 2016-12-07 MED ORDER — DILTIAZEM HCL ER COATED BEADS 180 MG PO CP24
360.0000 mg | ORAL_CAPSULE | Freq: Every day | ORAL | Status: DC
  Administered 2016-12-07 – 2016-12-14 (×8): 360 mg via ORAL
  Filled 2016-12-07 (×9): qty 2

## 2016-12-07 MED ORDER — ENSURE ENLIVE PO LIQD
237.0000 mL | Freq: Two times a day (BID) | ORAL | Status: DC
  Administered 2016-12-07 – 2016-12-14 (×4): 237 mL via ORAL

## 2016-12-07 MED ORDER — PROMETHAZINE HCL 25 MG/ML IJ SOLN: 12.5000 mg | Freq: Once | INTRAMUSCULAR | Status: DC

## 2016-12-07 MED ORDER — METOCLOPRAMIDE HCL 5 MG/ML IJ SOLN
10.0000 mg | Freq: Once | INTRAMUSCULAR | Status: AC
  Administered 2016-12-07: 10 mg via INTRAVENOUS
  Filled 2016-12-07: qty 2

## 2016-12-07 MED ORDER — METOCLOPRAMIDE HCL 5 MG/ML IJ SOLN
10.0000 mg | Freq: Three times a day (TID) | INTRAMUSCULAR | Status: DC | PRN
  Administered 2016-12-08 – 2016-12-10 (×3): 10 mg via INTRAVENOUS
  Filled 2016-12-07 (×4): qty 2

## 2016-12-07 NOTE — Progress Notes (Signed)
ANTICOAGULATION CONSULT NOTE - Follow Up Consult  Pharmacy Consult for Lovenox and Coumadin Indication: acute PE and afib  Allergies  Allergen Reactions  . Codeine Nausea Only    Patient Measurements: Height: 5\' 5"  (165.1 cm) Weight: 199 lb 4.7 oz (90.4 kg) IBW/kg (Calculated) : 57 Heparin Dosing Weight: 76 kg  Vital Signs: Temp: 97.6 F (36.4 C) (01/08 0321) Temp Source: Axillary (01/08 0321) BP: 149/95 (01/08 0407) Pulse Rate: 90 (01/08 0407)  Assessment: 82yof transferred from [facility]. Eliquis 5mg  po BID started at [facility] for afib 12/30. CT showed acute PE so dose increased to 10mg  BID on 12/31 - last dose 1/2 a.m. Was transitioned to IV heparin. Since pt on carbamazepine (can decrease apixaban concentration - not recommended to use together), Pharmacy consulted to transition to coumadin with lovenox bridge on 1/4.  Noted on new amiodarone. Patient reports she has "always been an easy bleeder." Will monitor closely. No bleeding issues reported this admit.  Today is day #5 overlap. INR has jumped from 1.38 to 2.17 (likely due to drug interaction with amiodarone). Lovenox dose remains appropriate.     Goal of Therapy:  Anti-Xa level 0.6-1 units/ml 4hrs after LMWH dose given  INR 2-3 Monitor platelets by anticoagulation protocol: Yes   Plan:  1) No coumadin tonight as INR likely to continue to increase 2) Continue lovenox 90mg  sq q12 for one more day 3) Daily INR  Louie Casa, PharmD, BCPS 12/07/2016 9:20 AM

## 2016-12-07 NOTE — Progress Notes (Signed)
PROGRESS NOTE    Karen Richardson  UJW:[id number] DOB: 09-Oct-1934 DOA: 12/01/2016 PCP: Ignatius Specking, MD     Brief Narrative:  Karen Richardson is a 83 y.o. female with medical history significant for hypertension and seizure disorder who presents in transfer from [facility] for management of atrial fibrillation with RVR. Patient states that she has been experiencing generalized weakness, fatigue with minimal daily activity. She then progressed to have exertional dyspnea, which prompted evaluation in the emergency department. On presentation to [facility] on 11/27/2016, she was in atrial fibrillation with RVR. She was initially treated with IV Cardizem. She was also given Rocephin and azithromycin for possible pneumonia. D-dimer was elevated and CT chest revealed right lower and middle lobe pulmonary emboli with evidence for heart strain. Patient continued to have rapid rates intermittently in the hospital and was eventually transferred to [facility] for cardiology evaluation.  Assessment & Plan:   Principal Problem:   Atrial fibrillation with RVR (HCC) Active Problems:   Hypertension   Seizures (HCC)   Pulmonary emboli (HCC)   GERD (gastroesophageal reflux disease)  Atrial fibrillation with RVR, new onset  - CHADSVASc 6 - Cardiology following  - Cardizem, metoprolol. Amio stopped today due to intolerance and nausea/vomiting.  - Due to interactions with tegretol, patient is started on coumadin/lovenox to bridge   Acute PE with right-heart strain  - Pt was initially admitted to hospital on 12/29 with suspected PNA  - D-dimer was elevated and CTA featured RLL and RML PEs with CT-evidence for right heart strain  - Due to interactions with tegretol, patient is started on coumadin/lovenox to bridge   Acute encephalopathy - Suspect due to sundowning, benzo use, phenergan, dehydration - Stop all meds that could contribute to lethargy and confusion - Improved today    Poor PO intake - IVF - Nutrition eval   Hypertension  - Continue Toprol and diltiazem  Questionable CAP - Pt was admitted to [facility] on 12/29 with suspected CAP and received 5 days treatment with Rocephin and azithro prior to transfer here  Seizure disorder  - Continue Tegretol  GERD - Continue Protonix    DVT prophylaxis: lovenox/coumadin  Code Status: Full Family Communication: daughter at bedside.  Disposition Plan: family requesting re-evaluation by PT and SNF placement. SW consulted    Consultants:   Cardiology  Procedures:   None  Antimicrobials:   None     Subjective: Overnight events reviewed. Had a bad night due to nausea/vomiting, thinks it is due to amiodarone therapy. Mentation is better today   Objective: Vitals:   12/06/16 1950 12/07/16 0321 12/07/16 0407 12/07/16 0955  BP: 124/75 (!) 134/94 (!) 149/95 138/78  Pulse: 97 79 90 84  Resp: 20 20 20    Temp: 97.9 F (36.6 C) 97.6 F (36.4 C)    TempSrc: Oral Axillary    SpO2: 95% 99% 100%   Weight:   90.4 kg (199 lb 4.7 oz)   Height:        Intake/Output Summary (Last 24 hours) at 12/07/16 1121 Last data filed at 12/07/16 0415  Gross per 24 hour  Intake          2141.67 ml  Output                2 ml  Net          2139.67 ml   Filed Weights   12/05/16 0606 12/06/16 1056 12/07/16 0407  Weight: 68.2 kg (150  lb 5.7 oz) 91.2 kg (201 lb 1 oz) 90.4 kg (199 lb 4.7 oz)    Examination:  General exam: Appears comfortable, getting up to the commode  Respiratory system: Clear to auscultation. Respiratory effort normal. Cardiovascular system: S1 & S2 heard, Irregular rhythm, rate 90s. No pedal edema. Gastrointestinal system: Abdomen is nondistended, soft and nontender. No organomegaly or masses felt. Normal bowel sounds heard. Central nervous system: Alert and oriented. No focal neurological deficits. Extremities: Symmetric 5 x 5 power. Skin: No rashes, lesions or ulcers  Data Reviewed: I  have personally reviewed following labs and imaging studies  CBC:  Recent Labs Lab 12/02/16 0011 12/02/16 0958 12/03/16 0615 12/04/16 0421 12/07/16 0815  WBC 7.0 7.2 7.8 6.3 7.4  NEUTROABS 4.3  --   --   --   --   HGB 11.2* 11.5* 11.5* 11.0* 11.7*  HCT 35.5* 35.3* 35.9* 33.7* 36.8  MCV 96.2 94.9 95.2 93.9 96.1  PLT 192 173 167 176 186   Basic Metabolic Panel:  Recent Labs Lab 12/02/16 0011  12/04/16 0421 12/05/16 0338 12/05/16 2054 12/06/16 0321 12/07/16 0815  NA 141  < > 139 137 135 139 139  K 4.8  < > 4.3 4.2 5.4* 4.7 5.0  CL 107  < > 107 106 104 107 109  CO2 23  < > 25 22 20* 22 22  GLUCOSE 111*  < > 104* 111* 131* 108* 117*  BUN 10  < > 14 18 22* 23* 24*  CREATININE 0.91  < > 1.00 0.98 1.22* 1.21* 1.31*  CALCIUM 8.6*  < > 8.5* 8.4* 8.6* 8.3* 8.1*  MG 2.1  --   --   --   --   --   --   < > = values in this interval not displayed. GFR: Estimated Creatinine Clearance: 38.1 mL/min (by C-G formula based on SCr of 1.31 mg/dL (H)). Liver Function Tests:  Recent Labs Lab 12/02/16 0011 12/05/16 2054  AST 45* 30  ALT 57* 33  ALKPHOS 83 85  BILITOT 0.5 0.6  PROT 6.1* 6.1*  ALBUMIN 3.3* 3.5    Recent Labs Lab 12/05/16 2054  LIPASE 22   No results for input(s): AMMONIA in the last 168 hours. Coagulation Profile:  Recent Labs Lab 12/03/16 1104 12/04/16 0421 12/05/16 0338 12/06/16 0321 12/07/16 0815  INR 1.15 1.25 1.25 1.38 2.17   Cardiac Enzymes:  Recent Labs Lab 12/02/16 0011 12/07/16 0815  TROPONINI <0.03 <0.03   BNP (last 3 results) No results for input(s): PROBNP in the last 8760 hours. HbA1C: No results for input(s): HGBA1C in the last 72 hours. CBG: No results for input(s): GLUCAP in the last 168 hours. Lipid Profile: No results for input(s): CHOL, HDL, LDLCALC, TRIG, CHOLHDL, LDLDIRECT in the last 72 hours. Thyroid Function Tests: No results for input(s): TSH, T4TOTAL, FREET4, T3FREE, THYROIDAB in the last 72 hours. Anemia  Panel: No results for input(s): VITAMINB12, FOLATE, FERRITIN, TIBC, IRON, RETICCTPCT in the last 72 hours. Sepsis Labs: No results for input(s): PROCALCITON, LATICACIDVEN in the last 168 hours.  No results found for this or any previous visit (from the past 240 hour(s)).     Radiology Studies: Dg Abd Acute W/chest  Result Date: 12/05/2016 CLINICAL DATA:  Abdominal pain.  Hypertension. EXAM: DG ABDOMEN ACUTE W/ 1V CHEST COMPARISON:  12/01/2016 FINDINGS: There is no evidence of dilated bowel loops or free intraperitoneal air. No radiopaque calculi or other significant radiographic abnormality is seen. Unchanged cardiomegaly. Mild linear left  base opacities may represent scarring. IMPRESSION: Negative abdominal radiographs.  No acute cardiopulmonary disease. Electronically Signed   By: Ellery Plunk M.D.   On: 12/05/2016 23:58      Scheduled Meds: . atorvastatin  10 mg Oral q1800  . carbamazepine  200 mg Oral TID  . diltiazem  360 mg Oral Daily  . docusate sodium  100 mg Oral Daily  . enoxaparin (LOVENOX) injection  1 mg/kg Subcutaneous Q12H  . LORazepam  0.5 mg Intravenous Once  . metoprolol succinate  75 mg Oral BID  . pantoprazole  40 mg Oral Daily  . polyethylene glycol  17 g Oral Daily  . sodium chloride flush  3 mL Intravenous Q12H  . Warfarin - Pharmacist Dosing Inpatient   Does not apply q1800   Continuous Infusions: . sodium chloride 100 mL/hr at 12/07/16 0814     LOS: 6 days    Time spent: 30 minutes   Noralee Stain, DO [facility] [email or web address] Password [email or web address] 12/07/2016, 11:21 AM

## 2016-12-07 NOTE — Progress Notes (Signed)
Patient Name: Karen Richardson Date of Encounter: 12/07/2016  Primary Cardiologist: Dr. [facility] Problem List     Principal Problem:   Atrial fibrillation with RVR [facility]) Active Problems:   Hypertension   Seizures (HCC)   Pulmonary emboli (HCC)   GERD (gastroesophageal reflux disease)     Subjective   Nauseated during the night.   No acute SOB or pain.   Inpatient Medications    Scheduled Meds: . amiodarone  400 mg Oral BID  . atorvastatin  10 mg Oral q1800  . carbamazepine  200 mg Oral TID  . diltiazem  90 mg Oral Q6H  . docusate sodium  100 mg Oral Daily  . enoxaparin (LOVENOX) injection  1 mg/kg Subcutaneous Q12H  . LORazepam  0.5 mg Intravenous Once  . metoprolol succinate  75 mg Oral BID  . pantoprazole  40 mg Oral Daily  . polyethylene glycol  17 g Oral Daily  . sodium chloride flush  3 mL Intravenous Q12H  . Warfarin - Pharmacist Dosing Inpatient   Does not apply q1800   Continuous Infusions: . sodium chloride 100 mL/hr at 12/07/16 0814   PRN Meds: sodium chloride, acetaminophen, metoCLOPramide (REGLAN) injection, ondansetron (ZOFRAN) IV, sodium chloride flush   Vital Signs    Vitals:   12/06/16 1351 12/06/16 1950 12/07/16 0321 12/07/16 0407  BP: 107/69 124/75 (!) 134/94 (!) 149/95  Pulse: (!) 108 97 79 90  Resp: 18 20 20 20   Temp: 97.4 F (36.3 C) 97.9 F (36.6 C) 97.6 F (36.4 C)   TempSrc: Oral Oral Axillary   SpO2: 96% 95% 99% 100%  Weight:    199 lb 4.7 oz (90.4 kg)  Height:        Intake/Output Summary (Last 24 hours) at 12/07/16 0852 Last data filed at 12/07/16 0415  Gross per 24 hour  Intake          2144.67 ml  Output                2 ml  Net          2142.67 ml   Filed Weights   12/05/16 0606 12/06/16 1056 12/07/16 0407  Weight: 150 lb 5.7 oz (68.2 kg) 201 lb 1 oz (91.2 kg) 199 lb 4.7 oz (90.4 kg)    Physical Exam    GEN: NAD.  Neck:  No  JVD Cardiac: Irregular Rate and Rhythm, murmurs, rubs, or gallops.  Mild/moderat  leg edema.  Radials/DP/PT 2+  and equal bilaterally.  Respiratory:  Respirations  regular and unlabored, clear to auscultation bilaterally. GI: Soft, nontender, nondistended, BS + x 4. Skin: warm and dry, no rash. Neuro:   Strength and sensation are intact. Psych:  AAOx3.  Normal affect.  Labs    CBC No results for input(s): WBC, NEUTROABS, HGB, HCT, MCV, PLT in the last 72 hours. Basic Metabolic Panel  Recent Labs  12/05/16 2054 12/06/16 0321  NA 135 139  K 5.4* 4.7  CL 104 107  CO2 20* 22  GLUCOSE 131* 108*  BUN 22* 23*  CREATININE 1.22* 1.21*  CALCIUM 8.6* 8.3*   Liver Function Tests  Recent Labs  12/05/16 2054  AST 30  ALT 33  ALKPHOS 85  BILITOT 0.6  PROT 6.1*  ALBUMIN 3.5    Recent Labs  12/05/16 2054  LIPASE 22   Cardiac Enzymes No results for input(s): CKTOTAL, CKMB, CKMBINDEX, TROPONINI in the last 72 hours. BNP Invalid input(s): POCBNP D-Dimer No  results for input(s): DDIMER in the last 72 hours. Hemoglobin A1C No results for input(s): HGBA1C in the last 72 hours. Fasting Lipid Panel No results for input(s): CHOL, HDL, LDLCALC, TRIG, CHOLHDL, LDLDIRECT in the last 72 hours. Thyroid Function Tests No results for input(s): TSH, T4TOTAL, T3FREE, THYROIDAB in the last 72 hours.  Invalid input(s): FREET3  Telemetry    Atrial fib.  Rate well controlled.  - Personally Reviewed  ECG    NA - Personally Reviewed  Radiology    Dg Abd Acute W/chest  Result Date: 12/05/2016 CLINICAL DATA:  Abdominal pain.  Hypertension. EXAM: DG ABDOMEN ACUTE W/ 1V CHEST COMPARISON:  12/01/2016 FINDINGS: There is no evidence of dilated bowel loops or free intraperitoneal air. No radiopaque calculi or other significant radiographic abnormality is seen. Unchanged cardiomegaly. Mild linear left base opacities may represent scarring. IMPRESSION: Negative abdominal radiographs.  No acute cardiopulmonary disease. Electronically Signed   By: Ellery Plunk M.D.   On:  12/05/2016 23:58    Cardiac Studies   Echo:   2D echo from [facility] showed moderate MR, severe biatrial enlargement, normal LVF and severe TR with at least moderate pulmonary HTN.   Patient Profile     83 year old female, quite active previously on farm with bilateral pulmonary embolism, atrial fibrillation, tricuspid regurgitation, secondary pulmonary hypertension with seizure disorder on Tegretol  Assessment & Plan    ATRIAL FIB WITH RVR:    Ms. Karen Richardson has a CHA2DS2 - VASc score of 6 .  Weight is controlled.  INR is therapeutic.  Will consolidate the Cardizem.  Her daughter really wants her to stop the amiodarone because of nausea and I will stop this.    ACUTE PULMONARY EMBOLISM:   On warfarin and Lovenox.    HTN:    BP OK.  Continue current therapy except as above.   Signed, Rollene Rotunda, MD  12/07/2016, 8:52 AM

## 2016-12-07 NOTE — Care Management Important Message (Signed)
Important Message  Patient Details  Name: Karen Richardson MRN: [id number] Date of Birth: 24-Nov-1934   Medicare Important Message Given:  Yes    Kyla Balzarine 12/07/2016, 12:53 PM

## 2016-12-07 NOTE — Progress Notes (Signed)
Initial Nutrition Assessment  DOCUMENTATION CODES:   Obesity unspecified  INTERVENTION:  Provide Ensure Enlive po BID, each supplement provides 350 kcal and 20 grams of protein.  Encourage adequate PO intake.   NUTRITION DIAGNOSIS:   Inadequate oral intake related to nausea, vomiting as evidenced by per patient/family report.  GOAL:   Patient will meet greater than or equal to 90% of their needs  MONITOR:   PO intake, Supplement acceptance, Labs, Weight trends, Skin, I & O's  REASON FOR ASSESSMENT:   Consult Assessment of nutrition requirement/status  ASSESSMENT:   83 y.o. female with medical history significant for hypertension and seizure disorder who presents in transfer from [facility] for management of atrial fibrillation with RVR. D-dimer was elevated and CT chest revealed right lower and middle lobe pulmonary emboli with evidence for heart strain. Patient continued to have rapid rates intermittently in the hospital and was eventually transferred to [facility] for cardiology evaluation.  Pt with n/v last night. Per MD note, n/v likely related to amiodarone therapy. During time of visit, pt reports being afraid to eat as she fears of n/v. Daughter at bedside reports amiodarone therapy has been stopped and has been encouraging pt to eat. Pt reports 0% intake at meals today. Meal completion has been 50-100% prior to today. Pt reports eating well with at least 2-3 meals a day at home. Usual body weight reported to be ~170 lbs. Pt reports recently weight gain related to fluid status. Pt is agreeable to nutritional supplements to aid in caloric and protein needs. RD to order.   Pt with no observed significant fat or muscle mass loss.   Diet Order:  Diet Heart Room service appropriate? Yes; Fluid consistency: Thin  Skin:  Reviewed, no issues  Last BM:  1/8  Height:   Ht Readings from Last 1 Encounters:  12/01/16 5\' 5"  (1.651 m)    Weight:   Wt  Readings from Last 1 Encounters:  12/07/16 199 lb 4.7 oz (90.4 kg)    Ideal Body Weight:  56.8 kg  BMI:  Body mass index is 33.16 kg/m.  Estimated Nutritional Needs:   Kcal:  1750-1900  Protein:  80-90 grams  Fluid:  Per MD  EDUCATION NEEDS:   No education needs identified at this time  Roslyn Smiling, MS, RD, LDN Pager # [phone] After hours/ weekend pager # [phone]

## 2016-12-07 NOTE — Clinical Social Work Placement (Signed)
   CLINICAL SOCIAL WORK PLACEMENT  NOTE  Date:  12/07/2016  Patient Details  Name: Karen Richardson MRN: [id number] Date of Birth: [birth year]  Clinical Social Work is seeking post-discharge placement for this patient at the Skilled  Nursing Facility level of care (*CSW will initial, date and re-position this form in  chart as items are completed):  Yes   Patient/family provided with [facility] Clinical Social Work Department's list of facilities offering this level of care within the geographic area requested by the patient (or if unable, by the patient's family).  Yes   Patient/family informed of their freedom to choose among providers that offer the needed level of care, that participate in Medicare, Medicaid or managed care program needed by the patient, have an available bed and are willing to accept the patient.  Yes   Patient/family informed of [place]'s ownership interest in [facility] and [facility], as well as of the fact that they are under no obligation to receive care at these facilities.  PASRR submitted to EDS on       PASRR number received on       Existing PASRR number confirmed on       FL2 transmitted to all facilities in geographic area requested by pt/family on       FL2 transmitted to all facilities within larger geographic area on       Patient informed that his/her managed care company has contracts with or will negotiate with certain facilities, including the following:            Patient/family informed of bed offers received.  Patient chooses bed at       Physician recommends and patient chooses bed at      Patient to be transferred to   on  .  Patient to be transferred to facility by       Patient family notified on   of transfer.  Name of family member notified:        PHYSICIAN Please sign FL2     Additional Comment:    _______________________________________________ Althea Charon, LCSW 12/07/2016, 2:57 PM

## 2016-12-07 NOTE — Progress Notes (Signed)
Patient had episodes of nausea and vomiting during the night, zofran 4mg  IV PRN given, patient got some brief relief after med given, after awhile patient started complaining of nausea and vomiting again, vital signs remain stable, MD paged, new order received for Reglan 10mg  , med given , patient now resting quietly in bed , daughter at bedside, will continue to monitor.

## 2016-12-07 NOTE — Clinical Social Work Note (Signed)
Clinical Social Work Assessment  Patient Details  Name: Karen Richardson MRN: [id number] Date of Birth: Mar 28, 1934  Date of referral:  12/07/16               Reason for consult:  Discharge Planning                Permission sought to share information with:  Family Supports Permission granted to share information::  Yes, Verbal Permission Granted  Name::     Karen Richardson  Agency::     Relationship::  spouse  Contact Information:  [phone]  Housing/Transportation Living arrangements for the past 2 months:  [place] of Information:  Patient, Adult Children Patient Interpreter Needed:  None Criminal Activity/Legal Involvement Pertinent to Current Situation/Hospitalization:  No - Comment as needed Significant Relationships:  Adult Children, Spouse Lives with:  Spouse Do you feel safe going back to the place where you live?  Yes Need for family participation in patient care:  No (Coment)  Care giving concerns:  Patients two daughters were at bedside for support. Both daughters do not live in [facility]  Social Worker assessment / plan:  Holiday representative met patient at bedside to offer support and discuss patients needs at discharge. Patient stated she lives at home with spouse and several animals. Patient is now agreeable to go to SNF placement and would prefer a facility close to [place] due to her husband being older and having medical problems of his own. Patient's daughters stated that patient has a lot of support from extended family and church family. CSW to complete necessary paperwork and initiate SNF search on patients behalf. CSW to follow up with patient once bed offers are available.  Employment status:  Retired Forensic scientist:  Medicare PT Recommendations:  [place] / Referral to community resources:  [place]  Patient/Family's Response to care:  Patient verbalized appreciation and understanding for CSW role  and involvement in care. Patient agreeable with current discharge plan to SNF following discharge.   Patient/Family's Understanding of and Emotional Response to Diagnosis, Current Treatment, and Prognosis: Patient with good understanding of current medical state and limitations around most recent hospitalization. Patient agreeable with SNF placement in hopes of transitioning to a more stable living environment.   Emotional Assessment Appearance:  Appears stated age Attitude/Demeanor/Rapport:  Other (attitude/demeanor) Affect (typically observed):  Pleasant, Calm Orientation:  Oriented to Self, Oriented to Place, Oriented to  Time, Oriented to Situation Alcohol / Substance use:  Not Applicable Psych involvement (Current and /or in the community):  No (Comment)  Discharge Needs  Concerns to be addressed:  No discharge needs identified Readmission within the last 30 days:  No Current discharge risk:  None Barriers to Discharge:  No Barriers Identified   Karen PICKNEY, LCSW 12/07/2016, 2:46 PM

## 2016-12-07 NOTE — Progress Notes (Signed)
Physical Therapy Treatment Patient Details Name: LARENDA FEAST MRN: [id number] DOB: [birth year] Today's Date: 12/07/2016    History of Present Illness pt is an 83 y/o female admitted as a transfer from [facility] for management of Afib with RVR and acute PE.  PMH significant for HTN and seizures.     PT Comments    Pt pleasant but limited with activity and desire to mobilize due to fatigue from reporting of vomiting and dry heaves as well as no sleep all night. Pt reports dizziness with all movement and unable to visually track EOB today due to loss of target. With repeated attempts pt stated she can't try due to nausea. No obvious vestibular symptoms at this time but will continue to reassess. Pt stood x 2 and ambulated limited distance due to feeling poorly. Pt and daughter report change in D/C plan to SNF as pt unable to ambulate or care for herself and does not have sufficient assist at home. Will continue to follow to return pt to PLOF.   HR 93   Follow Up Recommendations  Supervision/Assistance - 24 hour;SNF     Equipment Recommendations       Recommendations for Other Services       Precautions / Restrictions Precautions Precautions: Fall    Mobility  Bed Mobility Overal bed mobility: Needs Assistance       Supine to sit: Supervision     General bed mobility comments: supervision for line and increased time  Transfers Overall transfer level: Needs assistance   Transfers: Sit to/from Stand Sit to Stand: Min assist         General transfer comment: cues for hand placement, safety and sequence  Ambulation/Gait Ambulation/Gait assistance: Min assist Ambulation Distance (Feet): 15 Feet Assistive device: Rolling walker (2 wheeled) Gait Pattern/deviations: Step-through pattern;Decreased stride length;Trunk flexed     General Gait Details: cues for posture and position in RW, pt unable to keep trunk and neck extended with gait due to fatigue   Stairs             Wheelchair Mobility    Modified Rankin (Stroke Patients Only)       Balance Overall balance assessment: Needs assistance   Sitting balance-Leahy Scale: Good       Standing balance-Leahy Scale: Fair                      Cognition Arousal/Alertness: Awake/alert Behavior During Therapy: WFL for tasks assessed/performed Overall Cognitive Status: Within Functional Limits for tasks assessed                      Exercises      General Comments        Pertinent Vitals/Pain Pain Assessment: No/denies pain    Home Living                      Prior Function            PT Goals (current goals can now be found in the care plan section) Progress towards PT goals: Not progressing toward goals - comment (pt very limited by fatigue, dizziness and nausea)    Frequency    Min 3X/week      PT Plan Discharge plan needs to be updated    Co-evaluation             End of Session Equipment Utilized During Treatment: Gait belt Activity Tolerance: Patient limited by fatigue  Patient left: in bed;with call bell/phone within reach;with family/visitor present     Time: 1610-9604 PT Time Calculation (min) (ACUTE ONLY): 30 min  Charges:  $Gait Training: 8-22 mins $Therapeutic Activity: 8-22 mins                    G Codes:      Kye Hedden B Charnice Zwilling 12/27/2016, 12:53 PM  Delaney Meigs, PT [phone]

## 2016-12-07 NOTE — [place] FL2 (Signed)
[place] MEDICAID FL2 LEVEL OF CARE SCREENING TOOL     IDENTIFICATION  Patient Name: Karen Richardson Birthdate: September 19, 1934 Sex: female Admission Date (Current Location): 12/01/2016  [facility] and [place] Number:  Producer, television/film/video and Address:  [facility], [address], [place], [place] [address]      Provider Number: [id number]  Attending Physician Name and Address:  Jordan Hawks*  Relative Name and Phone Number:       Current Level of Care: Hospital Recommended Level of Care: Skilled Nursing Facility Prior Approval Number:    Date Approved/Denied:   PASRR Number: [id number]  Discharge Plan: SNF    Current Diagnoses: Patient Active Problem List   Diagnosis Date Noted  . Atrial fibrillation with RVR (HCC) 12/01/2016  . Pulmonary emboli (HCC) 12/01/2016  . GERD (gastroesophageal reflux disease) 12/01/2016  . Arthralgia of both knees 01/11/2016  . Hypertension 09/22/2011  . Seizures (HCC) 09/22/2011    Orientation RESPIRATION BLADDER Height & Weight     Self, Time, Situation, Place  Normal Continent Weight: 199 lb 4.7 oz (90.4 kg) Height:  5\' 5"  (165.1 cm)  BEHAVIORAL SYMPTOMS/MOOD NEUROLOGICAL BOWEL NUTRITION STATUS      Continent Diet (diet heart)  AMBULATORY STATUS COMMUNICATION OF NEEDS Skin   Extensive Assist Verbally Normal                       Personal Care Assistance Level of Assistance  Bathing, Feeding, Dressing Bathing Assistance: Limited assistance Feeding assistance: Independent Dressing Assistance: Limited assistance     Functional Limitations Info  Sight, Hearing, Speech Sight Info: Adequate Hearing Info: Adequate Speech Info: Adequate    SPECIAL CARE FACTORS FREQUENCY  PT (By licensed PT)     PT Frequency: 5x week OT Frequency: 5x week            Contractures Contractures Info: Not present    Additional Factors Info  Code Status, Allergies Code Status Info: Full Code Allergies  Info: Codeine           Current Medications (12/07/2016):  This is the current hospital active medication list Current Facility-Administered Medications  Medication Dose Route Frequency Provider Last Rate Last Dose  . 0.9 %  sodium chloride infusion  250 mL Intravenous PRN Lavone Neri Opyd, MD      . 0.9 %  sodium chloride infusion   Intravenous Continuous Jordan Hawks, DO 100 mL/hr at 12/07/16 0814    . acetaminophen (TYLENOL) tablet 650 mg  650 mg Oral Q4H PRN Briscoe Deutscher, MD   650 mg at 12/05/16 0818  . atorvastatin (LIPITOR) tablet 10 mg  10 mg Oral q1800 Lavone Neri Opyd, MD   10 mg at 12/06/16 1727  . carbamazepine (TEGRETOL) tablet 200 mg  200 mg Oral TID Briscoe Deutscher, MD   200 mg at 12/07/16 0955  . diltiazem (CARDIZEM CD) 24 hr capsule 360 mg  360 mg Oral Daily Rollene Rotunda, MD   360 mg at 12/07/16 1253  . docusate sodium (COLACE) capsule 100 mg  100 mg Oral Daily Jordan Hawks, DO   100 mg at 12/06/16 1021  . enoxaparin (LOVENOX) injection 90 mg  1 mg/kg Subcutaneous Q12H Almon Hercules, RPH   90 mg at 12/07/16 5621  . LORazepam (ATIVAN) injection 0.5 mg  0.5 mg Intravenous Once Leanne Chang, NP      . metoCLOPramide (REGLAN) injection 10 mg  10 mg  Intravenous Q8H PRN Roma Kayser Schorr, NP      . metoprolol succinate (TOPROL-XL) 24 hr tablet 75 mg  75 mg Oral BID Jake Bathe, MD   75 mg at 12/07/16 0955  . ondansetron (ZOFRAN) injection 4 mg  4 mg Intravenous Q6H PRN Briscoe Deutscher, MD   4 mg at 12/07/16 0138  . pantoprazole (PROTONIX) EC tablet 40 mg  40 mg Oral Daily Briscoe Deutscher, MD   40 mg at 12/07/16 0955  . polyethylene glycol (MIRALAX / GLYCOLAX) packet 17 g  17 g Oral Daily Kellogg, DO      . sodium chloride flush (NS) 0.9 % injection 3 mL  3 mL Intravenous Q12H Lavone Neri Opyd, MD   3 mL at 12/07/16 1000  . sodium chloride flush (NS) 0.9 % injection 3 mL  3 mL Intravenous PRN Briscoe Deutscher, MD      . Warfarin - Pharmacist  Dosing Inpatient   Does not apply q1800 Almon Hercules, [facility]   Stopped at 12/07/16 1800     Discharge Medications: Please see discharge summary for a list of discharge medications.  Relevant Imaging Results:  Relevant Lab Results:   Additional Information SS#[id number]  Althea Charon, LCSW

## 2016-12-08 ENCOUNTER — Inpatient Hospital Stay (HOSPITAL_COMMUNITY): Payer: Medicare Other

## 2016-12-08 LAB — BASIC METABOLIC PANEL
Anion gap: 9 (ref 5–15)
BUN: 21 mg/dL — AB (ref 6–20)
CO2: 20 mmol/L — ABNORMAL LOW (ref 22–32)
CREATININE: 1.12 mg/dL — AB (ref 0.44–1.00)
Calcium: 8.2 mg/dL — ABNORMAL LOW (ref 8.9–10.3)
Chloride: 111 mmol/L (ref 101–111)
GFR calc Af Amer: 52 mL/min — ABNORMAL LOW (ref >60)
GFR, EST NON AFRICAN AMERICAN: 45 mL/min — AB (ref >60)
GLUCOSE: 119 mg/dL — AB (ref 65–99)
POTASSIUM: 4.6 mmol/L (ref 3.5–5.1)
SODIUM: 140 mmol/L (ref 135–145)

## 2016-12-08 LAB — PROTIME-INR
INR: 3.71
Prothrombin Time: 37.7 seconds — ABNORMAL HIGH (ref 11.4–15.2)

## 2016-12-08 MED ORDER — METOPROLOL SUCCINATE ER 100 MG PO TB24
100.0000 mg | ORAL_TABLET | Freq: Two times a day (BID) | ORAL | Status: DC
Start: 2016-12-08 — End: 2016-12-14
  Administered 2016-12-08 – 2016-12-14 (×12): 100 mg via ORAL
  Filled 2016-12-08 (×12): qty 1

## 2016-12-08 NOTE — Progress Notes (Signed)
Patient called and stated, "That stuff you put on my belly made my face go numb." I had done a bladder scan on her earlier. Left cheek area over cheek bone felt numb to her. Neuro intact.VSS. Cont. To monitor patient. R.N. Aware.

## 2016-12-08 NOTE — Progress Notes (Signed)
Numbness gone .

## 2016-12-08 NOTE — Progress Notes (Signed)
ANTICOAGULATION CONSULT NOTE - Follow Up Consult  Pharmacy Consult for Coumadin Indication: acute PE and afib  Allergies  Allergen Reactions  . Codeine Nausea Only    Patient Measurements: Height: 5\' 5"  (165.1 cm) Weight: 205 lb 14.6 oz (93.4 kg) IBW/kg (Calculated) : 57 Heparin Dosing Weight: 76 kg  Vital Signs: Temp: 97.3 F (36.3 C) (01/09 0420) Temp Source: Oral (01/09 0420) BP: 137/109 (01/09 0420) Pulse Rate: 101 (01/09 0420)  Assessment: 82yof transferred transferred from [facility]. Eliquis 5mg  po BID started at [facility] for afib 12/30. CT showed acute PE so dose increased to 10mg  BID on 12/31, last dose 1/2 a.m. Since pt on carbamazepine (can decrease apixaban concentration - not recommended to use together), Now transitioned to Lovenox/warf on 1/4. INR jumping up and now elevated at 3.71. Noted on new amiodarone and pt reports "easy bleeder." instructed her to monitor closely. Hgb 11.7, plts 186.   Goal of Therapy:  INR 2-3 Monitor platelets by anticoagulation protocol: Yes   Plan:  Hold coumadin tonight Lovenox stopped Monitor daily INR, CBC, s/s of bleed  Enzo Bi, PharmD, [facility] Clinical Pharmacist Pager [phone] 12/08/2016 9:57 AM

## 2016-12-08 NOTE — Progress Notes (Signed)
Occupational Therapy Treatment Patient Details Name: Karen Richardson MRN: [id number] DOB: [birth year] Today's Date: 12/08/2016    History of present illness pt is an 83 y/o female admitted as a transfer from [facility] for management of Afib with RVR and acute PE.  PMH significant for HTN and seizures.    OT comments  Pt making great progress.  Overall min guard assist for functional transfers at this time and selfcare tasks simulated.  Will continue to follow.  Follow Up Recommendations  Supervision/Assistance - 24 hour;SNF    Equipment Recommendations  Other (comment) (TBD next venue of care.  )    Recommendations for Other Services      Precautions / Restrictions Precautions Precautions: Fall       Mobility Bed Mobility Overal bed mobility: Needs Assistance Bed Mobility: Supine to Sit;Sit to Supine     Supine to sit: Supervision Sit to supine: Supervision      Transfers Overall transfer level: Needs assistance Equipment used: Rolling walker (2 wheeled) Transfers: Sit to/from Stand Sit to Stand: Min guard         General transfer comment: Min instructional cueing for hand placement with sitting but pt sat down without squaring up the walker and without reaching back.      Balance Overall balance assessment: Needs assistance   Sitting balance-Leahy Scale: Good     Standing balance support: Bilateral upper extremity supported Standing balance-Leahy Scale: Good                     ADL Overall ADL's : Needs assistance/impaired     Grooming: Brushing hair;Sitting;Modified independent                   Toilet Transfer: Minimal assistance;Ambulation;RW Toilet Transfer Details (indicate cue type and reason): simulated as pt declined toileting         Functional mobility during ADLs: Min guard;Rolling walker General ADL Comments: Pt very fatigued from not sleeping well the past couple of days.  She was able to ambualate from her room  around the nurses station and back with min guard assist.  Distance greater than 100 feet.  She reports still being nauseaus.      Vision                     Perception     Praxis      Cognition   Behavior During Therapy: WFL for tasks assessed/performed Overall Cognitive Status: Within Functional Limits for tasks assessed                       Extremity/Trunk Assessment               Exercises     Shoulder Instructions       General Comments      Pertinent Vitals/ Pain       Pain Assessment: No/denies pain  Home Living                                          Prior Functioning/Environment              Frequency  Min 2X/week        Progress Toward Goals  OT Goals(current goals can now be found in the care plan section)  Progress towards OT goals: Progressing toward goals  Plan Discharge plan needs to be updated    Co-evaluation                 End of Session Equipment Utilized During Treatment: Oxygen   Activity Tolerance Patient tolerated treatment well   Patient Left in bed;with call bell/phone within reach   Nurse Communication Mobility status        Time: 0998-3382 OT Time Calculation (min): 24 min  Charges: OT General Charges $OT Visit: 1 Procedure OT Treatments $Self Care/Home Management : 23-37 mins  Karen Richardson 12/08/2016, 11:51 AM

## 2016-12-08 NOTE — Progress Notes (Signed)
Patient Name: Karen Richardson Date of Encounter: 12/08/2016  Primary Cardiologist: [facility]. [facility] Problem List     Principal Problem:   Atrial fibrillation with RVR [facility]) Active Problems:   Hypertension   Seizures (HCC)   Pulmonary emboli (HCC)   GERD (gastroesophageal reflux disease)     Subjective   Facial numbness after bladder scan.   Nausea continues.    Inpatient Medications    Scheduled Meds: . atorvastatin  10 mg Oral q1800  . carbamazepine  200 mg Oral TID  . diltiazem  360 mg Oral Daily  . docusate sodium  100 mg Oral Daily  . feeding supplement (ENSURE ENLIVE)  237 mL Oral BID BM  . LORazepam  0.5 mg Intravenous Once  . metoprolol succinate  75 mg Oral BID  . pantoprazole  40 mg Oral Daily  . polyethylene glycol  17 g Oral Daily  . sodium chloride flush  3 mL Intravenous Q12H  . Warfarin - Pharmacist Dosing Inpatient   Does not apply q1800   Continuous Infusions: . sodium chloride 100 mL/hr at 12/08/16 0447   PRN Meds: sodium chloride, acetaminophen, metoCLOPramide (REGLAN) injection, ondansetron (ZOFRAN) IV, sodium chloride flush   Vital Signs    Vitals:   12/07/16 1253 12/07/16 1448 12/07/16 2031 12/08/16 0420  BP: (!) 146/95 (!) 148/96 (!) 126/95 (!) 137/109  Pulse:  88 97 (!) 101  Resp:  18 18 20   Temp:  98 F (36.7 C) 97.7 F (36.5 C) 97.3 F (36.3 C)  TempSrc:  Oral Oral Oral  SpO2:  96% 93% 94%  Weight:    205 lb 14.6 oz (93.4 kg)  Height:        Intake/Output Summary (Last 24 hours) at 12/08/16 1025 Last data filed at 12/07/16 2100  Gross per 24 hour  Intake              600 ml  Output                0 ml  Net              600 ml   Filed Weights   12/06/16 1056 12/07/16 0407 12/08/16 0420  Weight: 201 lb 1 oz (91.2 kg) 199 lb 4.7 oz (90.4 kg) 205 lb 14.6 oz (93.4 kg)    Physical Exam    GEN: NAD.  Neck:  No  JVD Cardiac: Irregular Rate and Rhythm, murmurs, rubs, or gallops.  Mild/moderat leg edema.  Radials/DP/PT 2+   and equal bilaterally.  Respiratory:  Respirations  regular and unlabored, clear to auscultation bilaterally. GI: Soft, nontender, nondistended, BS + x 4. Skin: warm and dry, no rash. Neuro:   Strength and sensation are intact. Psych:  AAOx3.  Normal affect.  Labs    CBC  Recent Labs  12/07/16 0815  WBC 7.4  HGB 11.7*  HCT 36.8  MCV 96.1  PLT 186   Basic Metabolic Panel  Recent Labs  12/07/16 0815 12/08/16 0832  NA 139 140  K 5.0 4.6  CL 109 111  CO2 22 20*  GLUCOSE 117* 119*  BUN 24* 21*  CREATININE 1.31* 1.12*  CALCIUM 8.1* 8.2*   Liver Function Tests  Recent Labs  12/05/16 2054  AST 30  ALT 33  ALKPHOS 85  BILITOT 0.6  PROT 6.1*  ALBUMIN 3.5    Recent Labs  12/05/16 2054  LIPASE 22   Cardiac Enzymes  Recent Labs  12/07/16 0815  TROPONINI <  0.03   BNP Invalid input(s): POCBNP D-Dimer No results for input(s): DDIMER in the last 72 hours. Hemoglobin A1C No results for input(s): HGBA1C in the last 72 hours. Fasting Lipid Panel No results for input(s): CHOL, HDL, LDLCALC, TRIG, CHOLHDL, LDLDIRECT in the last 72 hours. Thyroid Function Tests No results for input(s): TSH, T4TOTAL, T3FREE, THYROIDAB in the last 72 hours.  Invalid input(s): FREET3  Telemetry    Atrial fib.  Rate well slightly increased.  - Personally Reviewed  ECG    NA - Personally Reviewed  Radiology    No results found.  Cardiac Studies   Echo:   2D echo from [facility] showed moderate MR, severe biatrial enlargement, normal LVF and severe TR with at least moderate pulmonary HTN.   Patient Profile     83 year old female, quite active previously on farm with bilateral pulmonary embolism, atrial fibrillation, tricuspid regurgitation, secondary pulmonary hypertension with seizure disorder on Tegretol  Assessment & Plan    ATRIAL FIB WITH RVR:    Karen Richardson has a CHA2DS2 - VASc score of 6 .  Rate is slightly up.  INR is therapeutic.  Stopped amiodarone  yesterday secondary to nausea.  I will increase the beta blocker slightly today.   ACUTE PULMONARY EMBOLISM:   On warfarin and Lovenox.    Can stop Lovenox overlap.   HTN:    BP OK.  Continue current therapy except as above.   Signed, Rollene Rotunda, MD  12/08/2016, 10:25 AM

## 2016-12-08 NOTE — Progress Notes (Signed)
Text page K.Kirby N.P. About decrease output and baldder scan zero. With N.S. At 100 ml/hr

## 2016-12-08 NOTE — Progress Notes (Signed)
Patient has vded sm. Amt. of urine emptied by N.T. Bladder scan and got 35 ml cont. To monitor urine output

## 2016-12-08 NOTE — Progress Notes (Signed)
PROGRESS NOTE    Karen Richardson  NWG:[id number] DOB: 20-Dec-1933 DOA: 12/01/2016 PCP: Ignatius Specking, MD     Brief Narrative:  Karen Richardson is a 83 y.o. female with medical history significant for hypertension and seizure disorder who presents in transfer from [facility] for management of atrial fibrillation with RVR. Patient states that she has been experiencing generalized weakness, fatigue with minimal daily activity. She then progressed to have exertional dyspnea, which prompted evaluation in the emergency department. On presentation to [facility] on 11/27/2016, she was in atrial fibrillation with RVR. She was initially treated with IV Cardizem. She was also given Rocephin and azithromycin for possible pneumonia. D-dimer was elevated and CT chest revealed right lower and middle lobe pulmonary emboli with evidence for heart strain. Patient continued to have rapid rates intermittently in the hospital and was eventually transferred to [facility] for cardiology evaluation.   A Fib treated with amiodarone, cardizem, metoprolol. PE treated with lovenox/coumadin and INR now therapeutic. Hospitalization has been complicated by encephalopathy, weakness, decreased oral intake, nausea, vomiting, decreased urine output.   Assessment & Plan:   Principal Problem:   Atrial fibrillation with RVR (HCC) Active Problems:   Hypertension   Seizures (HCC)   Pulmonary emboli (HCC)   GERD (gastroesophageal reflux disease)  Atrial fibrillation with RVR, new onset  - CHADSVASc 6 - Cardiology following  - Cardizem, metoprolol. Amio stopped due to intolerance and nausea/vomiting.  - Due to interactions with tegretol, patient is started on coumadin/lovenox to bridge. INR now therapeutic.   Acute PE with right-heart strain  - Pt was initially admitted to hospital on 12/29 with suspected PNA  - D-dimer was elevated and CTA featured RLL and RML PEs with CT-evidence for right heart strain   - Due to interactions with tegretol, patient is started on coumadin/lovenox to bridge. INR now therapeutic.   Acute encephalopathy - Suspect due to sundowning, benzo use, phenergan, dehydration - Stop all meds that could contribute to lethargy and confusion - Intermittent improvements. She is not sleeping much at nighttime and sleeping throughout the day instead, circadian rhythm is off   Poor PO intake - IVF - Nutrition eval   Decreased UOP - Renal function stable. Continue IVF, renal US pending   Hypertension  - Continue Toprol and diltiazem   CAP - Pt was admitted to [facility] on 12/29 with suspected CAP and received 5 days treatment with Rocephin and azithro prior to transfer here. Now off antibiotics   Seizure disorder  - Continue Tegretol  GERD - Continue Protonix    DVT prophylaxis: Coumadin  Code Status: Full Family Communication: daughter at bedside.  Disposition Plan: SNF when stable    Consultants:   Cardiology  Procedures:   None  Antimicrobials:   None     Subjective: Overnight events reviewed. Continues to have nausea, but no longer vomiting. No new complaints today.   Objective: Vitals:   12/07/16 1448 12/07/16 2031 12/08/16 0420 12/08/16 1057  BP: (!) 148/96 (!) 126/95 (!) 137/109 (!) 151/91  Pulse: 88 97 (!) 101   Resp: 18 18 20    Temp: 98 F (36.7 C) 97.7 F (36.5 C) 97.3 F (36.3 C)   TempSrc: Oral Oral Oral   SpO2: 96% 93% 94%   Weight:   93.4 kg (205 lb 14.6 oz)   Height:        Intake/Output Summary (Last 24 hours) at 12/08/16 1125 Last data filed at 12/07/16 2100  Gross  per 24 hour  Intake              600 ml  Output                0 ml  Net              600 ml   Filed Weights   12/06/16 1056 12/07/16 0407 12/08/16 0420  Weight: 91.2 kg (201 lb 1 oz) 90.4 kg (199 lb 4.7 oz) 93.4 kg (205 lb 14.6 oz)    Examination:  General exam: Resting comfortably  Respiratory system: Clear to auscultation. Respiratory effort  normal. Cardiovascular system: S1 & S2 heard, Irregular rhythm, rate 90s. +trace pedal edema. Gastrointestinal system: Abdomen is nondistended, soft and nontender. No organomegaly or masses felt. Normal bowel sounds heard. Central nervous system: Alert and oriented. No focal neurological deficits. Extremities: Symmetric 5 x 5 power. Skin: No rashes, lesions or ulcers  Data Reviewed: I have personally reviewed following labs and imaging studies  CBC:  Recent Labs Lab 12/02/16 0011 12/02/16 0958 12/03/16 0615 12/04/16 0421 12/07/16 0815  WBC 7.0 7.2 7.8 6.3 7.4  NEUTROABS 4.3  --   --   --   --   HGB 11.2* 11.5* 11.5* 11.0* 11.7*  HCT 35.5* 35.3* 35.9* 33.7* 36.8  MCV 96.2 94.9 95.2 93.9 96.1  PLT 192 173 167 176 186   Basic Metabolic Panel:  Recent Labs Lab 12/02/16 0011  12/05/16 0338 12/05/16 2054 12/06/16 0321 12/07/16 0815 12/08/16 0832  NA 141  < > 137 135 139 139 140  K 4.8  < > 4.2 5.4* 4.7 5.0 4.6  CL 107  < > 106 104 107 109 111  CO2 23  < > 22 20* 22 22 20*  GLUCOSE 111*  < > 111* 131* 108* 117* 119*  BUN 10  < > 18 22* 23* 24* 21*  CREATININE 0.91  < > 0.98 1.22* 1.21* 1.31* 1.12*  CALCIUM 8.6*  < > 8.4* 8.6* 8.3* 8.1* 8.2*  MG 2.1  --   --   --   --   --   --   < > = values in this interval not displayed. GFR: Estimated Creatinine Clearance: 45.3 mL/min (by C-G formula based on SCr of 1.12 mg/dL (H)). Liver Function Tests:  Recent Labs Lab 12/02/16 0011 12/05/16 2054  AST 45* 30  ALT 57* 33  ALKPHOS 83 85  BILITOT 0.5 0.6  PROT 6.1* 6.1*  ALBUMIN 3.3* 3.5    Recent Labs Lab 12/05/16 2054  LIPASE 22   No results for input(s): AMMONIA in the last 168 hours. Coagulation Profile:  Recent Labs Lab 12/04/16 0421 12/05/16 0338 12/06/16 0321 12/07/16 0815 12/08/16 0315  INR 1.25 1.25 1.38 2.17 3.71   Cardiac Enzymes:  Recent Labs Lab 12/02/16 0011 12/07/16 0815  TROPONINI <0.03 <0.03   BNP (last 3 results) No results for  input(s): PROBNP in the last 8760 hours. HbA1C: No results for input(s): HGBA1C in the last 72 hours. CBG: No results for input(s): GLUCAP in the last 168 hours. Lipid Profile: No results for input(s): CHOL, HDL, LDLCALC, TRIG, CHOLHDL, LDLDIRECT in the last 72 hours. Thyroid Function Tests: No results for input(s): TSH, T4TOTAL, FREET4, T3FREE, THYROIDAB in the last 72 hours. Anemia Panel: No results for input(s): VITAMINB12, FOLATE, FERRITIN, TIBC, IRON, RETICCTPCT in the last 72 hours. Sepsis Labs: No results for input(s): PROCALCITON, LATICACIDVEN in the last 168 hours.  No results found for this  or any previous visit (from the past 240 hour(s)).     Radiology Studies: No results found.    Scheduled Meds: . atorvastatin  10 mg Oral q1800  . carbamazepine  200 mg Oral TID  . diltiazem  360 mg Oral Daily  . docusate sodium  100 mg Oral Daily  . feeding supplement (ENSURE ENLIVE)  237 mL Oral BID BM  . LORazepam  0.5 mg Intravenous Once  . metoprolol succinate  100 mg Oral BID  . pantoprazole  40 mg Oral Daily  . polyethylene glycol  17 g Oral Daily  . sodium chloride flush  3 mL Intravenous Q12H  . Warfarin - Pharmacist Dosing Inpatient   Does not apply q1800   Continuous Infusions: . sodium chloride 100 mL/hr at 12/08/16 0447     LOS: 7 days    Time spent: 30 minutes   Noralee Stain, DO [facility] [email or web address] Password [email or web address] 12/08/2016, 11:25 AM

## 2016-12-09 DIAGNOSIS — R34 Anuria and oliguria: Secondary | ICD-10-CM | POA: Diagnosis not present

## 2016-12-09 DIAGNOSIS — N179 Acute kidney failure, unspecified: Secondary | ICD-10-CM

## 2016-12-09 LAB — BASIC METABOLIC PANEL
Anion gap: 4 — ABNORMAL LOW (ref 5–15)
BUN: 19 mg/dL (ref 6–20)
CHLORIDE: 112 mmol/L — AB (ref 101–111)
CO2: 23 mmol/L (ref 22–32)
CREATININE: 1.02 mg/dL — AB (ref 0.44–1.00)
Calcium: 8.1 mg/dL — ABNORMAL LOW (ref 8.9–10.3)
GFR calc Af Amer: 59 mL/min — ABNORMAL LOW (ref >60)
GFR, EST NON AFRICAN AMERICAN: 51 mL/min — AB (ref >60)
GLUCOSE: 120 mg/dL — AB (ref 65–99)
POTASSIUM: 4.2 mmol/L (ref 3.5–5.1)
SODIUM: 139 mmol/L (ref 135–145)

## 2016-12-09 LAB — PROTIME-INR
INR: 3.39
Prothrombin Time: 35.1 seconds — ABNORMAL HIGH (ref 11.4–15.2)

## 2016-12-09 MED ORDER — FUROSEMIDE 10 MG/ML IJ SOLN
40.0000 mg | Freq: Two times a day (BID) | INTRAMUSCULAR | Status: DC
  Administered 2016-12-09 – 2016-12-13 (×9): 40 mg via INTRAVENOUS
  Filled 2016-12-09 (×9): qty 4

## 2016-12-09 MED ORDER — WARFARIN SODIUM 1 MG PO TABS
1.0000 mg | ORAL_TABLET | Freq: Once | ORAL | Status: AC
  Administered 2016-12-09: 1 mg via ORAL
  Filled 2016-12-09: qty 1

## 2016-12-09 NOTE — Progress Notes (Signed)
PT Cancellation Note  Patient Details Name: Karen Richardson MRN: [id number] DOB: July 25, 1934   Cancelled Treatment:    Reason Eval/Treat Not Completed: Other (comment). Checked on pt this morning and she was in the bathroom and asked to be seen in afternoon. Checked back this afternoon and pt reports that Lasix has kept her up and down to bathroom all day and she is exhausted and cannot tolerate therapy. Will check back tomorrow.    Ahmiya Abee L Kemi Gell 12/09/2016, 2:20 PM

## 2016-12-09 NOTE — Progress Notes (Signed)
ANTICOAGULATION CONSULT NOTE - Follow Up Consult  Pharmacy Consult for Coumadin Indication: acute PE and afib  Allergies  Allergen Reactions  . Codeine Nausea Only    Patient Measurements: Height: 5\' 5"  (165.1 cm) Weight: 208 lb 1.8 oz (94.4 kg) IBW/kg (Calculated) : 57 Heparin Dosing Weight: 76 kg  Vital Signs: Temp: 97.9 F (36.6 C) (01/10 0332) Temp Source: Oral (01/10 0332) BP: 137/104 (01/10 1003) Pulse Rate: 103 (01/10 1003)  Assessment: Anticoag: Transferred from [facility]. Eliquis 5mg  po BID started at [facility] for afib 12/30. CT showed acute PE so dose increased to 10mg  BID on 12/31, last dose 1/2 a.m. Since pt on carbamazepine (can decrease apixaban concentration - not recommended to use together), Now transitioned to Lovenox/warf on 1/4. Noted pt reports "easy bleeder." instructed her to monitor closely. CHA2DS2 - VASc score of 6  Hgb 11.7, plts 186.  INR down 3.39 (dose held 1/8, 1/9).   Goal of Therapy:  INR 2-3 Monitor platelets by anticoagulation protocol: Yes   Plan:  Coumadin 1mg  po x 1 tonight. Monitor daily INR, CBC, s/s of bleed Diurese   Alon Mazor S. Merilynn Finland, PharmD, [facility] Clinical Staff Pharmacist Pager [phone]  12/09/2016 10:52 AM

## 2016-12-09 NOTE — Progress Notes (Addendum)
First time patient has voided tonight since 7 .p.m. 125 ml. With N.S. At 100 ml/hr  .Cont. To monitor output. Weigh is up again today. Cont. to have generalize edema.

## 2016-12-09 NOTE — Progress Notes (Signed)
Patient Name: Karen Richardson Date of Encounter: 12/09/2016  Primary Cardiologist: Dr. [facility] Problem List     Principal Problem:   Atrial fibrillation with RVR [facility]) Active Problems:   Hypertension   Seizures (HCC)   Pulmonary emboli (HCC)   GERD (gastroesophageal reflux disease)     Subjective   Nausea is improved.  However, she has abdominal discomfort with distension.  She has increased abdominal girth.   Inpatient Medications    Scheduled Meds: . atorvastatin  10 mg Oral q1800  . carbamazepine  200 mg Oral TID  . diltiazem  360 mg Oral Daily  . docusate sodium  100 mg Oral Daily  . feeding supplement (ENSURE ENLIVE)  237 mL Oral BID BM  . LORazepam  0.5 mg Intravenous Once  . metoprolol succinate  100 mg Oral BID  . pantoprazole  40 mg Oral Daily  . polyethylene glycol  17 g Oral Daily  . sodium chloride flush  3 mL Intravenous Q12H  . Warfarin - Pharmacist Dosing Inpatient   Does not apply q1800   Continuous Infusions:  PRN Meds: sodium chloride, acetaminophen, metoCLOPramide (REGLAN) injection, ondansetron (ZOFRAN) IV, sodium chloride flush   Vital Signs    Vitals:   12/08/16 1057 12/08/16 1138 12/08/16 2033 12/09/16 0332  BP: (!) 151/91  132/82 132/74  Pulse:  (!) 104 90 (!) 103  Resp:   18 18  Temp:   97.9 F (36.6 C) 97.9 F (36.6 C)  TempSrc:   Oral Oral  SpO2:  95% 96% 95%  Weight:    208 lb 1.8 oz (94.4 kg)  Height:        Intake/Output Summary (Last 24 hours) at 12/09/16 0844 Last data filed at 12/09/16 0829  Gross per 24 hour  Intake             1320 ml  Output              225 ml  Net             1095 ml   Filed Weights   12/07/16 0407 12/08/16 0420 12/09/16 0332  Weight: 199 lb 4.7 oz (90.4 kg) 205 lb 14.6 oz (93.4 kg) 208 lb 1.8 oz (94.4 kg)    Physical Exam    GEN: NAD.  Neck:  Positive JVD Cardiac: Irregular Rate and Rhythm, murmurs, rubs, or gallops. Moderate leg edema.  Radials/DP/PT 2+  and equal bilaterally.    Respiratory:  Respirations  regular and unlabored, clear to auscultation bilaterally. GI: Soft, nontender, distended, BS + x 4. Skin: warm and dry, no rash. Neuro:   Strength and sensation are intact. Psych:  AAOx3.  Normal affect.  Labs    CBC  Recent Labs  12/07/16 0815  WBC 7.4  HGB 11.7*  HCT 36.8  MCV 96.1  PLT 186   Basic Metabolic Panel  Recent Labs  12/08/16 0832 12/09/16 0430  NA 140 139  K 4.6 4.2  CL 111 112*  CO2 20* 23  GLUCOSE 119* 120*  BUN 21* 19  CREATININE 1.12* 1.02*  CALCIUM 8.2* 8.1*   Liver Function Tests No results for input(s): AST, ALT, ALKPHOS, BILITOT, PROT, ALBUMIN in the last 72 hours. No results for input(s): LIPASE, AMYLASE in the last 72 hours. Cardiac Enzymes  Recent Labs  12/07/16 0815  TROPONINI <0.03   BNP Invalid input(s): POCBNP D-Dimer No results for input(s): DDIMER in the last 72 hours. Hemoglobin A1C No results for  input(s): HGBA1C in the last 72 hours. Fasting Lipid Panel No results for input(s): CHOL, HDL, LDLCALC, TRIG, CHOLHDL, LDLDIRECT in the last 72 hours. Thyroid Function Tests No results for input(s): TSH, T4TOTAL, T3FREE, THYROIDAB in the last 72 hours.  Invalid input(s): FREET3  Telemetry    Atrial fib.  Rate well slightly increased again today.  - Personally Reviewed  ECG    NA - Personally Reviewed  Radiology    US Renal  Result Date: 12/08/2016 CLINICAL DATA:  Decreased urine output EXAM: RENAL / URINARY TRACT ULTRASOUND COMPLETE COMPARISON:  None. FINDINGS: Right Kidney: Length: 10.1 cm. Echogenicity within normal limits. No mass or hydronephrosis visualized. Left Kidney: Length: 10.4 cm. Echogenicity within normal limits. No mass or hydronephrosis visualized. Bladder: Appears normal for degree of bladder distention. Note is made of mild ascites and bilateral pleural effusions. IMPRESSION: No acute abnormality of the kidneys. Mild ascites and bilateral pleural effusions. Electronically  Signed   By: Alcide Clever M.D.   On: 12/08/2016 11:37    Cardiac Studies   Echo:   2D echo from [facility] showed moderate MR, severe biatrial enlargement, normal LVF and severe TR with at least moderate pulmonary HTN.   Patient Profile     83 year old female, quite active previously on farm with bilateral pulmonary embolism, atrial fibrillation, tricuspid regurgitation, secondary pulmonary hypertension with seizure disorder on Tegretol  Assessment & Plan    ATRIAL FIB WITH RVR:    Ms. Karen Richardson has a CHA2DS2 - VASc score of 6 .  Rate is slightly up.  INR is therapeutic.  Stopped amiodarone secondary to nausea.  I increased the beta blocker yesterday.   Continue current therapy.  Her rate is likely up secondary to her increased volume.   ACUTE PULMONARY EMBOLISM:   On warfarin.  Lovenox stopped.   HTN:    BP diastolic slightly high.  Continue current therapy.    VOLUME:  Plus 5.6 liters since admission.   She now has clear evidence of volume overload.  I am going to give Lasix 40 mg IV to begin with.  Follow strict I/Os.    Signed, Rollene Rotunda, MD  12/09/2016, 8:44 AM

## 2016-12-09 NOTE — Progress Notes (Addendum)
Progress Note    Karen Richardson  XBW:[id number] DOB: August 11, 1934  DOA: 12/01/2016 PCP: Ignatius Specking, MD    Brief Narrative:   Chief complaint: F/U afib  Karen Richardson is an 83 y.o. female with medical history significant forhypertension and seizure disorder who presents in transfer from [facility] for management of atrial fibrillation with RVR. Patient states that she has been experiencing generalized weakness, fatigue with minimal daily activity. She then progressed to have exertional dyspnea, which prompted evaluation in the emergency department. On presentation to [facility] on 11/27/2016, she was in atrial fibrillation with RVR. She was initially treated with IV Cardizem. She was also given Rocephin and azithromycin for possible pneumonia. D-dimer was elevated and CT chest revealed right lower and middle lobe pulmonary emboli with evidence for heart strain. Patient continued to have rapid rates intermittently in the hospital and was eventually transferred to [facility] for cardiology evaluation.   A Fib treated with amiodarone, cardizem, metoprolol. PE treated with lovenox/coumadin and INR now therapeutic. Hospitalization has been complicated by encephalopathy, weakness, decreased oral intake, nausea, vomiting, decreased urine output.   Assessment/Plan:   Principal problems:  Atrial fibrillation with RVR, new onset, now with volume overload CHADSVASc 6, currently on Coumadin with a therapeutic INR. Attempting to rate control with Cardizem and metoprolol. Patient was unable to tolerate amiodarone with increased nausea/vomiting so this was discontinued 12/08/16. Patient appears to be grossly volume overloaded and Lasix has been started. IV fluids have been discontinued. Discussed case with Dr. Antoine Poche.  Acute PE with right-heart strain  Pt was initially admitted to hospital on 12/29 with suspected PNA. D-dimer was elevated and CTA featured RLL and RML PEs  with CT-evidence for right heart strain. Due to interactions with tegretol, patient was started on coumadin/lovenox to bridge. INR now therapeutic.   Acute encephalopathy Improved with discontinuation of sedating medications.  Poor PO intake Dietitian consulted. Continue supplements per recommendations.  Decreased UOP Renal function stable. Renal ultrasound negative. Lasix started as the patient is grossly volume overloaded.  Hypertension  Continue Toprol and diltiazem.  CAP Pt was admitted to [facility] on 12/29 with suspected CAP and received 5 days treatment with Rocephin and azithro prior to transfer here. Now off antibiotics   Seizure disorder  Continue Tegretol.  GERD Continue Protonix.   Family Communication/Anticipated D/C date and plan/Code Status   DVT prophylaxis: Coumadin ordered. Code Status: Full Code.  Family Communication: Daughter at the bedside. Disposition Plan: Likely will need SNF placement.   Medical Consultants:    Cardiology   Procedures:    None  Anti-Infectives:    None  Subjective:   The patient reports that she is having increased abdominal discomfort and bloating. Review of symptoms is positive for dyspnea, nausea, worsening leg edema.  Objective:    Vitals:   12/08/16 1057 12/08/16 1138 12/08/16 2033 12/09/16 0332  BP: (!) 151/91  132/82 132/74  Pulse:  (!) 104 90 (!) 103  Resp:   18 18  Temp:   97.9 F (36.6 C) 97.9 F (36.6 C)  TempSrc:   Oral Oral  SpO2:  95% 96% 95%  Weight:    94.4 kg (208 lb 1.8 oz)  Height:        Intake/Output Summary (Last 24 hours) at 12/09/16 0818 Last data filed at 12/09/16 0651  Gross per 24 hour  Intake             1320 ml  Output              125 ml  Net             1195 ml   Filed Weights   12/07/16 0407 12/08/16 0420 12/09/16 0332  Weight: 90.4 kg (199 lb 4.7 oz) 93.4 kg (205 lb 14.6 oz) 94.4 kg (208 lb 1.8 oz)    Exam: General exam: Appears Uncomfortable and mildly  anxious. Respiratory system: Clear to auscultation. Respiratory effort labored. Cardiovascular system: Heart sounds are irregular. + JVD,  no rubs, gallops or clicks. No murmurs. Gastrointestinal system: Abdomen is distended,  nontender. No organomegaly or masses felt. Normal bowel sounds heard. Central nervous system: Alert and oriented x 2. No focal neurological deficits. Extremities: No clubbing,  or cyanosis. 3+ edema. Skin: No rashes, lesions or ulcers. Psychiatry: Judgement and insight appear mildly impaired. Mood & affect anxious.   Data Reviewed:   I have personally reviewed following labs and imaging studies:  Labs: Basic Metabolic Panel:  Recent Labs Lab 12/05/16 2054 12/06/16 0321 12/07/16 0815 12/08/16 0832 12/09/16 0430  NA 135 139 139 140 139  K 5.4* 4.7 5.0 4.6 4.2  CL 104 107 109 111 112*  CO2 20* 22 22 20* 23  GLUCOSE 131* 108* 117* 119* 120*  BUN 22* 23* 24* 21* 19  CREATININE 1.22* 1.21* 1.31* 1.12* 1.02*  CALCIUM 8.6* 8.3* 8.1* 8.2* 8.1*   GFR Estimated Creatinine Clearance: 50 mL/min (by C-G formula based on SCr of 1.02 mg/dL (H)). Liver Function Tests:  Recent Labs Lab 12/05/16 2054  AST 30  ALT 33  ALKPHOS 85  BILITOT 0.6  PROT 6.1*  ALBUMIN 3.5    Recent Labs Lab 12/05/16 2054  LIPASE 22   No results for input(s): AMMONIA in the last 168 hours. Coagulation profile  Recent Labs Lab 12/05/16 0338 12/06/16 0321 12/07/16 0815 12/08/16 0315 12/09/16 0430  INR 1.25 1.38 2.17 3.71 3.39    CBC:  Recent Labs Lab 12/02/16 0958 12/03/16 0615 12/04/16 0421 12/07/16 0815  WBC 7.2 7.8 6.3 7.4  HGB 11.5* 11.5* 11.0* 11.7*  HCT 35.3* 35.9* 33.7* 36.8  MCV 94.9 95.2 93.9 96.1  PLT 173 167 176 186   Cardiac Enzymes:  Recent Labs Lab 12/07/16 0815  TROPONINI <0.03   BNP (last 3 results) No results for input(s): PROBNP in the last 8760 hours. CBG: No results for input(s): GLUCAP in the last 168 hours. D-Dimer: No results  for input(s): DDIMER in the last 72 hours. Hgb A1c: No results for input(s): HGBA1C in the last 72 hours. Lipid Profile: No results for input(s): CHOL, HDL, LDLCALC, TRIG, CHOLHDL, LDLDIRECT in the last 72 hours. Thyroid function studies: No results for input(s): TSH, T4TOTAL, T3FREE, THYROIDAB in the last 72 hours.  Invalid input(s): FREET3 Anemia work up: No results for input(s): VITAMINB12, FOLATE, FERRITIN, TIBC, IRON, RETICCTPCT in the last 72 hours. Sepsis Labs:  Recent Labs Lab 12/02/16 0958 12/03/16 0615 12/04/16 0421 12/07/16 0815  WBC 7.2 7.8 6.3 7.4    Microbiology No results found for this or any previous visit (from the past 240 hour(s)).  Radiology: US Renal  Result Date: 12/08/2016 CLINICAL DATA:  Decreased urine output EXAM: RENAL / URINARY TRACT ULTRASOUND COMPLETE COMPARISON:  None. FINDINGS: Right Kidney: Length: 10.1 cm. Echogenicity within normal limits. No mass or hydronephrosis visualized. Left Kidney: Length: 10.4 cm. Echogenicity within normal limits. No mass or hydronephrosis visualized. Bladder: Appears normal for degree of bladder distention. Note is  made of mild ascites and bilateral pleural effusions. IMPRESSION: No acute abnormality of the kidneys. Mild ascites and bilateral pleural effusions. Electronically Signed   By: Alcide Clever M.D.   On: 12/08/2016 11:37    Medications:   . atorvastatin  10 mg Oral q1800  . carbamazepine  200 mg Oral TID  . diltiazem  360 mg Oral Daily  . docusate sodium  100 mg Oral Daily  . feeding supplement (ENSURE ENLIVE)  237 mL Oral BID BM  . LORazepam  0.5 mg Intravenous Once  . metoprolol succinate  100 mg Oral BID  . pantoprazole  40 mg Oral Daily  . polyethylene glycol  17 g Oral Daily  . sodium chloride flush  3 mL Intravenous Q12H  . Warfarin - Pharmacist Dosing Inpatient   Does not apply q1800   Continuous Infusions: . sodium chloride 100 mL/hr at 12/08/16 2321    Medical decision making is of high  complexity and this patient is at high risk of deterioration, therefore this is a level 3 visit.  (> 4 problem points, 5 data points, high risk)   Problems/DDx Points   Self limited or minor (max 2)       1   Established problem, stable       1   Established problem, worsening       2   New problem, no additional W/U planned (max 1)       3   New problem, additional W/U planned        4    Data Reviewed Points   Review/order clinical lab tests       1   Review/order x-rays       1   Review/order tests (Echo, EKG, PFTs, etc)       1   Discussion of test results w/ performing MD       1   Independent review of image, tracing or specimen       2   Decision to obtain old records       1   Review and summation of old records       2    Level of risk Presenting prob Diagnostics Management   Minimal 1 self limited/minor Labs CXR EKG/EEG U/A U/S Rest Gargles Bandages Dressings   Low 2 or more self limited/minor 1 stable chronic Acute uncomplicated illness Tests (PFTS) Non-CV imaging Arterial labs Biopsies of skin OTC drugs Minor surgery-no risk PT OT IVF without additives    Moderate 1 or more chronic illnesses w/ mild exac, progression or S/E from tx 2 or more stable chronic illnesses Undiagnosed new problem w/ uncertain prognosis Acute complicated injury  Stress tests Endoscopies with no risk factors Deep needle or incisional bx CV imaging without risk LP Thoracentesis Paracentesis Minor surgery w/ risks Elective major surgery w/ no risk (open, percutaneous or endoscopic) Prescription drugs Therapeutic nucl med IVF with additives Closed tx of fracture/dislocation    High Severe exac of chronic illness Acute or chronic illness/injury may pose a threat to life or bodily function (ARF) Change in neuro status    CV imaging w/ contrast and risk Cardio electophysiologic tests Endoscopies w/ risk Discography Elective major surgery Emergency major  surgery Parenteral controlled substances Drug therapy req monitoring for toxicity DNR/de-escalation of care    MDM Prob points Data points Risk   Straightforward    <1    <1    Min   Low complexity  2    2    Low   Moderate    3    3    Mod   High Complexity    4 or more    4 or more    High      LOS: 8 days   Clementina Mareno  [facility] Pager [phone]. If unable to reach me by pager, please call my cell phone at [phone].  *Please refer to [email or web address], password [email or web address] to get updated schedule on who will round on this patient, as hospitalists switch teams weekly. If 7PM-7AM, please contact night-coverage at [email or web address], password [email or web address] for any overnight needs.  12/09/2016, 8:18 AM

## 2016-12-10 ENCOUNTER — Inpatient Hospital Stay (HOSPITAL_COMMUNITY): Payer: Medicare Other

## 2016-12-10 DIAGNOSIS — E876 Hypokalemia: Secondary | ICD-10-CM | POA: Diagnosis not present

## 2016-12-10 DIAGNOSIS — R0603 Acute respiratory distress: Secondary | ICD-10-CM

## 2016-12-10 DIAGNOSIS — Z8719 Personal history of other diseases of the digestive system: Secondary | ICD-10-CM

## 2016-12-10 DIAGNOSIS — R0600 Dyspnea, unspecified: Secondary | ICD-10-CM

## 2016-12-10 DIAGNOSIS — R1310 Dysphagia, unspecified: Secondary | ICD-10-CM

## 2016-12-10 HISTORY — DX: Personal history of other diseases of the digestive system: Z87.19

## 2016-12-10 LAB — BASIC METABOLIC PANEL
Anion gap: 9 (ref 5–15)
BUN: 13 mg/dL (ref 6–20)
CALCIUM: 8.3 mg/dL — AB (ref 8.9–10.3)
CHLORIDE: 106 mmol/L (ref 101–111)
CO2: 25 mmol/L (ref 22–32)
CREATININE: 1.15 mg/dL — AB (ref 0.44–1.00)
GFR calc Af Amer: 51 mL/min — ABNORMAL LOW (ref >60)
GFR calc non Af Amer: 44 mL/min — ABNORMAL LOW (ref >60)
GLUCOSE: 98 mg/dL (ref 65–99)
Potassium: 3.4 mmol/L — ABNORMAL LOW (ref 3.5–5.1)
Sodium: 140 mmol/L (ref 135–145)

## 2016-12-10 LAB — CBC
HCT: 35.1 % — ABNORMAL LOW (ref 36.0–46.0)
Hemoglobin: 11.5 g/dL — ABNORMAL LOW (ref 12.0–15.0)
MCH: 30.7 pg (ref 26.0–34.0)
MCHC: 32.8 g/dL (ref 30.0–36.0)
MCV: 93.9 fL (ref 78.0–100.0)
PLATELETS: 185 10*3/uL (ref 150–400)
RBC: 3.74 MIL/uL — ABNORMAL LOW (ref 3.87–5.11)
RDW: 15.8 % — AB (ref 11.5–15.5)
WBC: 4.5 10*3/uL (ref 4.0–10.5)

## 2016-12-10 LAB — PROTIME-INR
INR: 2.56
PROTHROMBIN TIME: 28 s — AB (ref 11.4–15.2)

## 2016-12-10 MED ORDER — WARFARIN SODIUM 2.5 MG PO TABS
2.5000 mg | ORAL_TABLET | Freq: Once | ORAL | Status: AC
  Administered 2016-12-10: 2.5 mg via ORAL
  Filled 2016-12-10: qty 1

## 2016-12-10 MED ORDER — POTASSIUM CHLORIDE CRYS ER 20 MEQ PO TBCR
20.0000 meq | EXTENDED_RELEASE_TABLET | Freq: Two times a day (BID) | ORAL | Status: DC
  Administered 2016-12-10 – 2016-12-13 (×7): 20 meq via ORAL
  Filled 2016-12-10 (×7): qty 1

## 2016-12-10 MED ORDER — POTASSIUM CHLORIDE CRYS ER 20 MEQ PO TBCR
40.0000 meq | EXTENDED_RELEASE_TABLET | Freq: Once | ORAL | Status: AC
  Administered 2016-12-10: 40 meq via ORAL
  Filled 2016-12-10: qty 2

## 2016-12-10 NOTE — Progress Notes (Addendum)
Clinical Social Worker met patient and family at bedside to discuss patients discharge plan to SNF. Patients daughters and husband was present in the room during assessment. Patients husband stated he would prefer [facility] due to it being the closet facility to their residence. CSW has contacted patients attending to discuss patients possibly discharge because patient has [employer] and authorization for insurance has been pending due to patient not having a discharge date. CSW will contact [facility] to see if bed is still available for patient.    12:30pm CSW spoke to [facility] admission coordinator of [facility]. Kristin Bruins stated that she can go ahead and offer a bed for patient but she only has one bed available at moment and needs to know for sure that patient is discharging tomorrow 12/11/16. Kristin Bruins stated that she will hold bed available for patient until tomorrow and reassess in the morning.  Rhea Pink, MSW,  [place]

## 2016-12-10 NOTE — Progress Notes (Signed)
OT Cancellation Note  Patient Details Name: Karen Richardson MRN: [id number] DOB: 1934-09-05   Cancelled Treatment:    Reason Eval/Treat Not Completed: Fatigue/lethargy limiting ability to participate. Despite max verbal encouragement; pt declining to work with OT at this time due to fatigue. Will follow up as time allows.  Gaye Alken M.S., OTR/L Pager: [phone]  12/10/2016, 4:49 PM

## 2016-12-10 NOTE — Progress Notes (Signed)
Progress Note    Karen Richardson  ONG:[id number] DOB: 04/05/1934  DOA: 12/01/2016 PCP: Ignatius Specking, MD    Brief Narrative:   Chief complaint: F/U afib  Karen Richardson is an 83 y.o. female with medical history significant forhypertension and seizure disorder who presents in transfer from [facility] for management of atrial fibrillation with RVR. Patient states that she has been experiencing generalized weakness, fatigue with minimal daily activity. She then progressed to have exertional dyspnea, which prompted evaluation in the emergency department. On presentation to [facility] on 11/27/2016, she was in atrial fibrillation with RVR. She was initially treated with IV Cardizem. She was also given Rocephin and azithromycin for possible pneumonia. D-dimer was elevated and CT chest revealed right lower and middle lobe pulmonary emboli with evidence for heart strain. Patient continued to have rapid rates intermittently in the hospital and was eventually transferred to [facility] for cardiology evaluation.   A Fib treated with amiodarone, cardizem, metoprolol. PE treated with lovenox/coumadin and INR now therapeutic. Hospitalization has been complicated by encephalopathy, weakness, decreased oral intake, nausea, vomiting, decreased urine output.   Assessment/Plan:   Principal problems:  Atrial fibrillation with RVR, new onset, now with volume overload CHADSVASc 6, currently on Coumadin with a therapeutic INR. Attempting to rate control with Cardizem and metoprolol. Patient was unable to tolerate amiodarone with increased nausea/vomiting so this was discontinued 12/08/16. Patient Continues to appear volume overloaded, continue Lasix. IV fluids have been discontinued. I/O- 2.8 L.  Acute PE with right-heart strain  Pt was initially admitted to hospital on 12/29 with suspected PNA. D-dimer was elevated and CTA featured RLL and RML PEs with CT-evidence for right heart  strain. Due to interactions with tegretol, patient was started on coumadin/lovenox to bridge. INR now therapeutic.   Acute encephalopathy Improved with discontinuation of sedating medications.  Active problems:  Hypokalemia  Secondary to diuretics. Start on potassium supplementation.  Poor PO intake Dietitian consulted. Continue supplements per recommendations.  Decreased UOP Renal function stable. Renal ultrasound negative. Creatinine stable with diuresis.  Hypertension  Continue Toprol and diltiazem.  CAP Pt was admitted to [facility] on 12/29 with suspected CAP and received 5 days treatment with Rocephin and azithro prior to transfer here. Now off antibiotics   Seizure disorder  Continue Tegretol.  GERD/history of esophageal stricture Continue Protonix. Patient reports dysphasia and regurgitation of food. Will obtain an esophagram to evaluate for recurrent stricture.    Family Communication/Anticipated D/C date and plan/Code Status   DVT prophylaxis: Coumadin ordered. Code Status: Full Code.  Family Communication: Daughters and husband at the bedside. Disposition Plan: Likely will need SNF placement.   Medical Consultants:    Cardiology   Procedures:    None  Anti-Infectives:    None  Subjective:   The patient reports that she is Having episodes of nausea and vomiting after swallowing her food without feeling like the food is reaching her stomach. Continues to feel a bit bloated and is concerned about her lower extremity edema.  Objective:    Vitals:   12/09/16 1003 12/09/16 1242 12/09/16 2030 12/10/16 0524  BP: (!) 137/104 (!) 151/105 (!) 149/70 119/68  Pulse: (!) 103 (!) 120 94 (!) 56  Resp:  19 20 20   Temp:  97.9 F (36.6 C) 98 F (36.7 C) 98 F (36.7 C)  TempSrc:  Oral Oral Oral  SpO2:  92% 98% 95%  Weight:      Height:  Intake/Output Summary (Last 24 hours) at 12/10/16 0756 Last data filed at 12/09/16 2246  Gross per 24  hour  Intake              604 ml  Output             3450 ml  Net            -2846 ml   Filed Weights   12/07/16 0407 12/08/16 0420 12/09/16 0332  Weight: 90.4 kg (199 lb 4.7 oz) 93.4 kg (205 lb 14.6 oz) 94.4 kg (208 lb 1.8 oz)    Exam: General exam: Appears More comfortable than yesterday, but still a bit restless. Respiratory system: Clear to auscultation. Respiratory effort labored. Cardiovascular system: Heart sounds are irregular. + JVD,  no rubs, gallops or clicks. No murmurs. Gastrointestinal system: Abdomen is distended,  nontender. No organomegaly or masses felt. Normal bowel sounds heard. Central nervous system: Alert and oriented x 2. No focal neurological deficits. Extremities: No clubbing,  or cyanosis. 2+ edema. Skin: No rashes, lesions or ulcers. Psychiatry: Judgement and insight appear mildly impaired. Mood & affect anxious.   Data Reviewed:   I have personally reviewed following labs and imaging studies:  Labs: Basic Metabolic Panel:  Recent Labs Lab 12/06/16 0321 12/07/16 0815 12/08/16 0832 12/09/16 0430 12/10/16 0523  NA 139 139 140 139 140  K 4.7 5.0 4.6 4.2 3.4*  CL 107 109 111 112* 106  CO2 22 22 20* 23 25  GLUCOSE 108* 117* 119* 120* 98  BUN 23* 24* 21* 19 13  CREATININE 1.21* 1.31* 1.12* 1.02* 1.15*  CALCIUM 8.3* 8.1* 8.2* 8.1* 8.3*   GFR Estimated Creatinine Clearance: 44.3 mL/min (by C-G formula based on SCr of 1.15 mg/dL (H)). Liver Function Tests:  Recent Labs Lab 12/05/16 2054  AST 30  ALT 33  ALKPHOS 85  BILITOT 0.6  PROT 6.1*  ALBUMIN 3.5    Recent Labs Lab 12/05/16 2054  LIPASE 22   No results for input(s): AMMONIA in the last 168 hours. Coagulation profile  Recent Labs Lab 12/06/16 0321 12/07/16 0815 12/08/16 0315 12/09/16 0430 12/10/16 0523  INR 1.38 2.17 3.71 3.39 2.56    CBC:  Recent Labs Lab 12/04/16 0421 12/07/16 0815 12/10/16 0523  WBC 6.3 7.4 4.5  HGB 11.0* 11.7* 11.5*  HCT 33.7* 36.8 35.1*    MCV 93.9 96.1 93.9  PLT 176 186 185   Cardiac Enzymes:  Recent Labs Lab 12/07/16 0815  TROPONINI <0.03   BNP (last 3 results) No results for input(s): PROBNP in the last 8760 hours. CBG: No results for input(s): GLUCAP in the last 168 hours. D-Dimer: No results for input(s): DDIMER in the last 72 hours. Hgb A1c: No results for input(s): HGBA1C in the last 72 hours. Lipid Profile: No results for input(s): CHOL, HDL, LDLCALC, TRIG, CHOLHDL, LDLDIRECT in the last 72 hours. Thyroid function studies: No results for input(s): TSH, T4TOTAL, T3FREE, THYROIDAB in the last 72 hours.  Invalid input(s): FREET3 Anemia work up: No results for input(s): VITAMINB12, FOLATE, FERRITIN, TIBC, IRON, RETICCTPCT in the last 72 hours. Sepsis Labs:  Recent Labs Lab 12/04/16 0421 12/07/16 0815 12/10/16 0523  WBC 6.3 7.4 4.5    Microbiology No results found for this or any previous visit (from the past 240 hour(s)).  Radiology: US Renal  Result Date: 12/08/2016 CLINICAL DATA:  Decreased urine output EXAM: RENAL / URINARY TRACT ULTRASOUND COMPLETE COMPARISON:  None. FINDINGS: Right Kidney: Length: 10.1 cm. Echogenicity within  normal limits. No mass or hydronephrosis visualized. Left Kidney: Length: 10.4 cm. Echogenicity within normal limits. No mass or hydronephrosis visualized. Bladder: Appears normal for degree of bladder distention. Note is made of mild ascites and bilateral pleural effusions. IMPRESSION: No acute abnormality of the kidneys. Mild ascites and bilateral pleural effusions. Electronically Signed   By: Alcide Clever M.D.   On: 12/08/2016 11:37    Medications:   . atorvastatin  10 mg Oral q1800  . carbamazepine  200 mg Oral TID  . diltiazem  360 mg Oral Daily  . docusate sodium  100 mg Oral Daily  . feeding supplement (ENSURE ENLIVE)  237 mL Oral BID BM  . furosemide  40 mg Intravenous BID  . LORazepam  0.5 mg Intravenous Once  . metoprolol succinate  100 mg Oral BID  .  pantoprazole  40 mg Oral Daily  . polyethylene glycol  17 g Oral Daily  . sodium chloride flush  3 mL Intravenous Q12H  . Warfarin - Pharmacist Dosing Inpatient   Does not apply q1800   Continuous Infusions:   Medical decision making is of high complexity and this patient is at high risk of deterioration, therefore this is a level 3 visit.  (> 4 problem points, 2 data points, high risk)   Problems/DDx Points   Self limited or minor (max 2)       1   Established problem, stable       1   Established problem, worsening       2   New problem, no additional W/U planned (max 1)       3   New problem, additional W/U planned        4    Data Reviewed Points   Review/order clinical lab tests       1   Review/order x-rays       1   Review/order tests (Echo, EKG, PFTs, etc)       1   Discussion of test results w/ performing MD       1   Independent review of image, tracing or specimen       2   Decision to obtain old records       1   Review and summation of old records       2    Level of risk Presenting prob Diagnostics Management   Minimal 1 self limited/minor Labs CXR EKG/EEG U/A U/S Rest Gargles Bandages Dressings   Low 2 or more self limited/minor 1 stable chronic Acute uncomplicated illness Tests (PFTS) Non-CV imaging Arterial labs Biopsies of skin OTC drugs Minor surgery-no risk PT OT IVF without additives    Moderate 1 or more chronic illnesses w/ mild exac, progression or S/E from tx 2 or more stable chronic illnesses Undiagnosed new problem w/ uncertain prognosis Acute complicated injury  Stress tests Endoscopies with no risk factors Deep needle or incisional bx CV imaging without risk LP Thoracentesis Paracentesis Minor surgery w/ risks Elective major surgery w/ no risk (open, percutaneous or endoscopic) Prescription drugs Therapeutic nucl med IVF with additives Closed tx of fracture/dislocation    High Severe exac of chronic illness Acute or chronic  illness/injury may pose a threat to life or bodily function (ARF) Change in neuro status    CV imaging w/ contrast and risk Cardio electophysiologic tests Endoscopies w/ risk Discography Elective major surgery Emergency major surgery Parenteral controlled substances Drug therapy req monitoring for toxicity DNR/de-escalation of care  MDM Prob points Data points Risk   Straightforward    <1    <1    Min   Low complexity    2    2    Low   Moderate    3    3    Mod   High Complexity    4 or more    4 or more    High      LOS: 9 days   RAMA,CHRISTINA  [facility] Pager [phone]. If unable to reach me by pager, please call my cell phone at [phone].  *Please refer to [email or web address], password [email or web address] to get updated schedule on who will round on this patient, as hospitalists switch teams weekly. If 7PM-7AM, please contact night-coverage at [email or web address], password [email or web address] for any overnight needs.  12/10/2016, 7:56 AM

## 2016-12-10 NOTE — Progress Notes (Signed)
Patient Name: Karen Richardson Date of Encounter: 12/10/2016  Primary Cardiologist: [facility] Problem List     Principal Problem:   Atrial fibrillation with RVR [facility]) Active Problems:   Hypertension   Seizures (HCC)   Pulmonary emboli (HCC)   GERD (gastroesophageal reflux disease)   AKI (acute kidney injury) (HCC)   Decreased urine output   Hypokalemia     Subjective   Still with some SOB and LE edema, but better.   Inpatient Medications    Scheduled Meds: . atorvastatin  10 mg Oral q1800  . carbamazepine  200 mg Oral TID  . diltiazem  360 mg Oral Daily  . docusate sodium  100 mg Oral Daily  . feeding supplement (ENSURE ENLIVE)  237 mL Oral BID BM  . furosemide  40 mg Intravenous BID  . LORazepam  0.5 mg Intravenous Once  . metoprolol succinate  100 mg Oral BID  . pantoprazole  40 mg Oral Daily  . polyethylene glycol  17 g Oral Daily  . potassium chloride  20 mEq Oral BID  . sodium chloride flush  3 mL Intravenous Q12H  . warfarin  2.5 mg Oral ONCE-1800  . Warfarin - Pharmacist Dosing Inpatient   Does not apply q1800   Continuous Infusions:  PRN Meds: sodium chloride, acetaminophen, metoCLOPramide (REGLAN) injection, ondansetron (ZOFRAN) IV, sodium chloride flush   Vital Signs    Vitals:   12/09/16 2030 12/10/16 0524 12/10/16 0900 12/10/16 1227  BP: (!) 149/70 119/68 129/75   Pulse: 94 (!) 56 (!) 120 89  Resp: 20 20 20    Temp: 98 F (36.7 C) 98 F (36.7 C) 97.8 F (36.6 C)   TempSrc: Oral Oral Oral   SpO2: 98% 95% 96%   Weight:      Height:        Intake/Output Summary (Last 24 hours) at 12/10/16 1305 Last data filed at 12/10/16 1259  Gross per 24 hour  Intake              120 ml  Output             5150 ml  Net            -5030 ml   Filed Weights   12/07/16 0407 12/08/16 0420 12/09/16 0332  Weight: 199 lb 4.7 oz (90.4 kg) 205 lb 14.6 oz (93.4 kg) 208 lb 1.8 oz (94.4 kg)    Physical Exam    GEN: Well nourished, well developed, in  no acute distress.  HEENT: Grossly normal.  Neck: Supple, JVD to jaw, no carotid bruits, or masses. Cardiac: Irreg R&R, + murmurs, No rubs, or gallops. No clubbing, cyanosis, 2+ edema.  Radials/DP/PT 2+ and equal bilaterally.  Respiratory:  Respirations regular and unlabored, decreased BS bases bilaterally. GI: Soft, nontender, nondistended, BS + x 4. MS: no deformity or atrophy. Skin: warm and dry, no rash. Neuro:  Strength and sensation are intact. Psych: AAOx3.  Normal affect.  Labs    CBC  Recent Labs  12/10/16 0523  WBC 4.5  HGB 11.5*  HCT 35.1*  MCV 93.9  PLT 185   Lab Results  Component Value Date   INR 2.56 12/10/2016   INR 3.39 12/09/2016   INR 3.71 12/08/2016   Basic Metabolic Panel  Recent Labs  12/09/16 0430 12/10/16 0523  NA 139 140  K 4.2 3.4*  CL 112* 106  CO2 23 25  GLUCOSE 120* 98  BUN 19 13  CREATININE 1.02* 1.15*  CALCIUM 8.1* 8.3*   Liver Function Tests Lab Results  Component Value Date   ALT 33 12/05/2016   AST 30 12/05/2016   ALKPHOS 85 12/05/2016   BILITOT 0.6 12/05/2016    Telemetry    Atrial fib, RVR at times. No bradycardia, no pauses > 2 sec - Personally Reviewed  ECG    n/a - Personally Reviewed  Radiology    No results found.  Cardiac Studies   Echo:   2D echo from [facility] showed moderate MR, severe biatrial enlargement, normal LVF and severe TR with at least moderate pulmonary HTN.   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 Hx HTN, seizure disorder on Tegretol  Assessment & Plan    ATRIAL FIB WITH RVR:    Karen Richardson has a CHA2DS2 - VASc score of 6 .  Rate is slightly up.  INR is therapeutic.  Stopped amiodarone secondary to nausea. beta blocker increased 01/09, now on high doses of both BB & CCB. Could possibly increase Cardizem to 240 mg bid.  Continue current therapy for now.  Her rate is likely up secondary to her increased volume.   ACUTE PULMONARY EMBOLISM:   On warfarin.  Lovenox stopped.   HTN:    BP diastolic was slightly high, improved with Lasix.  Continue current therapy.    VOLUME OVERLOAD: ?acute diastolic CHF? Admit weight 195. I/O Plus 5.6 liters since admission, s/p diuresis of 4.4 L. On Lasix 40 mg IV bid. Follow strict I/Os, daily weights. Probably can change to po in 24-48 hr  HYPOKALEMIA: 2nd diuresis, supplement ordered, will give an additional 40 meq today and follow.  Signed, Theodore Demark, PA-C  12/10/2016, 1:05 PM   History and all data above reviewed.  Patient examined.  I agree with the findings as above.  The patient exam reveals [id number]  ,  Lungs: Clear  ,  Abd: Positive bowel sounds, no rebound no guarding, Ext Mild edema  .  All available labs, radiology testing, previous records reviewed. Agree with documented assessment and plan. Atrial fib:  Rate is elevated but not extraordinary.  Continue current meds and IV diuresis tonight.   Fayrene Fearing Shamiyah Ngu  2:40 PM  12/10/2016

## 2016-12-10 NOTE — Progress Notes (Signed)
Physical Therapy Treatment Patient Details Name: STEPHANIEANN GILLAND MRN: [id number] DOB: [birth year] Today's Date: January 01, 2017    History of Present Illness pt is an 83 y/o female admitted as a transfer from [facility] for management of Afib with RVR and acute PE.  PMH significant for HTN and seizures.     PT Comments    Pt pleasant, fatigued and reports feeling like her heart is not connected to her body. Pt agreeable to gait with encouragement but denied further activity or HEP end of session. Pt with HR 89-126 with activity and encouraged further hall ambulation with nursing staff. Will continue to follow to progress mobility.    Follow Up Recommendations  SNF;Supervision for mobility/OOB     Equipment Recommendations       Recommendations for Other Services       Precautions / Restrictions Precautions Precautions: Fall Precaution Comments: watch HR    Mobility  Bed Mobility Overal bed mobility: Modified Independent       Supine to sit: Modified independent (Device/Increase time) Sit to supine: Modified independent (Device/Increase time)   General bed mobility comments: increased time  Transfers Overall transfer level: Needs assistance   Transfers: Sit to/from Stand Sit to Stand: Min guard         General transfer comment: no cues needed and guarding for balance at bed and toilet  Ambulation/Gait Ambulation/Gait assistance: Min guard Ambulation Distance (Feet): 200 Feet Assistive device: Rolling walker (2 wheeled) Gait Pattern/deviations: Step-through pattern;Decreased stride length;Trunk flexed   Gait velocity interpretation: Below normal speed for age/gender General Gait Details: cues for posture and position in RW, pt unable to keep trunk and neck extended with gait due to fatigue   Stairs            Wheelchair Mobility    Modified Rankin (Stroke Patients Only)       Balance Overall balance assessment: Needs assistance   Sitting  balance-Leahy Scale: Good       Standing balance-Leahy Scale: Good                      Cognition Arousal/Alertness: Awake/alert Behavior During Therapy: WFL for tasks assessed/performed Overall Cognitive Status: Within Functional Limits for tasks assessed                      Exercises      General Comments        Pertinent Vitals/Pain Pain Assessment: No/denies pain    Home Living                      Prior Function            PT Goals (current goals can now be found in the care plan section) Progress towards PT goals: Progressing toward goals    Frequency           PT Plan Current plan remains appropriate    Co-evaluation             End of Session   Activity Tolerance: Patient tolerated treatment well Patient left: in bed;with call bell/phone within reach;with family/visitor present     Time: 1046-1100 PT Time Calculation (min) (ACUTE ONLY): 14 min  Charges:  $Gait Training: 8-22 mins                    G Codes:      Edel Rivero B Daltyn Degroat 01-01-17, 12:29 PM Delaney Meigs, PT [phone]

## 2016-12-10 NOTE — Progress Notes (Signed)
Patient ambulated to bathroom using walker. No other needs at this time. Daughter present at bedside, call light within reach

## 2016-12-10 NOTE — Progress Notes (Signed)
ANTICOAGULATION CONSULT NOTE - Follow Up Consult  Pharmacy Consult for Coumadin Indication: acute PE and afib  Allergies  Allergen Reactions  . Codeine Nausea Only    Patient Measurements: Height: 5\' 5"  (165.1 cm) Weight: 208 lb 1.8 oz (94.4 kg) IBW/kg (Calculated) : 57 Heparin Dosing Weight: 76 kg  Vital Signs: Temp: 98 F (36.7 C) (01/11 0524) Temp Source: Oral (01/11 0524) BP: 119/68 (01/11 0524) Pulse Rate: 56 (01/11 0524)  Assessment: Anticoag: Transferred from [facility]. Eliquis 5mg  po BID started at [facility] for afib 12/30. CT showed acute PE so dose increased to 10mg  BID on 12/31, last dose 1/2 a.m. Since pt on carbamazepine (can decrease apixaban concentration - not recommended to use together), Now transitioned to Lovenox/warf on 1/4. Noted pt reports "easy bleeder." instructed her to monitor closely. CHA2DS2 - VASc score of 6  Hgb 11.7, plts 186.  INR down 3.39>2.56 (dose held 1/8, 1/9).    Goal of Therapy:  INR 2-3 Monitor platelets by anticoagulation protocol: Yes   Plan:  Coumadin 2.5 mg po x 1 tonight. Monitor daily INR, CBC, s/s of bleed Diurese   Therasa Lorenzi S. Merilynn Finland, PharmD, [facility] Clinical Staff Pharmacist Pager [phone]  12/10/2016 10:54 AM

## 2016-12-11 DIAGNOSIS — K228 Other specified diseases of esophagus: Secondary | ICD-10-CM

## 2016-12-11 DIAGNOSIS — K2289 Other specified disease of esophagus: Secondary | ICD-10-CM

## 2016-12-11 HISTORY — DX: Other specified disease of esophagus: K22.89

## 2016-12-11 LAB — PROTIME-INR
INR: 2.42
Prothrombin Time: 26.7 seconds — ABNORMAL HIGH (ref 11.4–15.2)

## 2016-12-11 LAB — BASIC METABOLIC PANEL
Anion gap: 12 (ref 5–15)
BUN: 11 mg/dL (ref 6–20)
CALCIUM: 8.6 mg/dL — AB (ref 8.9–10.3)
CO2: 28 mmol/L (ref 22–32)
Chloride: 100 mmol/L — ABNORMAL LOW (ref 101–111)
Creatinine, Ser: 1.06 mg/dL — ABNORMAL HIGH (ref 0.44–1.00)
GFR calc Af Amer: 56 mL/min — ABNORMAL LOW (ref >60)
GFR, EST NON AFRICAN AMERICAN: 48 mL/min — AB (ref >60)
GLUCOSE: 102 mg/dL — AB (ref 65–99)
Potassium: 3.6 mmol/L (ref 3.5–5.1)
Sodium: 140 mmol/L (ref 135–145)

## 2016-12-11 MED ORDER — METOCLOPRAMIDE HCL 5 MG PO TABS
5.0000 mg | ORAL_TABLET | Freq: Three times a day (TID) | ORAL | Status: DC
  Administered 2016-12-11: 5 mg via ORAL
  Filled 2016-12-11 (×3): qty 1

## 2016-12-11 MED ORDER — WARFARIN SODIUM 2.5 MG PO TABS
2.5000 mg | ORAL_TABLET | Freq: Once | ORAL | Status: AC
  Administered 2016-12-11: 2.5 mg via ORAL
  Filled 2016-12-11: qty 1

## 2016-12-11 NOTE — Care Management Important Message (Signed)
Important Message  Patient Details  Name: Karen Richardson MRN: [id number] Date of Birth: 09/15/1934   Medicare Important Message Given:  Yes    Kyla Balzarine 12/11/2016, 12:29 PM

## 2016-12-11 NOTE — Progress Notes (Signed)
ANTICOAGULATION CONSULT NOTE - Follow Up Consult  Pharmacy Consult for Coumadin Indication: acute PE and afib  Allergies  Allergen Reactions  . Codeine Nausea Only    Patient Measurements: Height: 5\' 5"  (165.1 cm) Weight: 189 lb 6 oz (85.9 kg) IBW/kg (Calculated) : 57 Heparin Dosing Weight: 76 kg  Vital Signs: Temp: 98.3 F (36.8 C) (01/12 0625) Temp Source: Oral (01/12 0625) BP: 139/78 (01/12 0625) Pulse Rate: 128 (01/12 1607)  Assessment: 82yof transferred from [facility]. Eliquis 5mg  po BID started at [facility] for afib 12/30. CT showed acute PE so dose increased to 10mg  BID on 12/31 - last dose 1/2 a.m. Was then transitioned to IV heparin. Since pt on carbamazepine (can decrease apixaban concentration - not recommended to use together), pharmacy consulted to transition to coumadin with lovenox bridge on 1/4. Lovenox stopped 1/9 with elevated INR. Coumadin doses held 1/8 and 19, resumed 1/10. INR at goal today 2.42. Amiodarone d/c'ed 1/8.   Goal of Therapy:  INR 2-3 Monitor platelets by anticoagulation protocol: Yes   Plan:  1) Coumadin 2.5mg  x 1 2) Daily INR  Louie Casa, PharmD, BCPS 12/11/2016 9:16 AM

## 2016-12-11 NOTE — Progress Notes (Signed)
Progress Note    Karen Richardson  ZOX:[id number] DOB: Oct 27, 1934  DOA: 12/01/2016 PCP: Ignatius Specking, MD    Brief Narrative:   Chief complaint: F/U afib  Karen Richardson is an 83 y.o. female with medical history significant forhypertension and seizure disorder who presents in transfer from [facility] for management of atrial fibrillation with RVR. Patient states that she has been experiencing generalized weakness, fatigue with minimal daily activity. She then progressed to have exertional dyspnea, which prompted evaluation in the emergency department. On presentation to [facility] on 11/27/2016, she was in atrial fibrillation with RVR. She was initially treated with IV Cardizem. She was also given Rocephin and azithromycin for possible pneumonia. D-dimer was elevated and CT chest revealed right lower and middle lobe pulmonary emboli with evidence for heart strain. Patient continued to have rapid rates intermittently in the hospital and was eventually transferred to [facility] for cardiology evaluation.   A Fib treated with amiodarone, cardizem, metoprolol. PE treated with lovenox/coumadin and INR now therapeutic. Hospitalization has been complicated by encephalopathy, weakness, decreased oral intake, nausea, vomiting, decreased urine output.   Assessment/Plan:   Principal problems:  Atrial fibrillation with RVR, new onset, now with volume overload CHADSVASc 6, currently on Coumadin with a therapeutic INR. Heart rate still suboptimal despite Cardizem and metoprolol. Blood pressure stable. Patient was unable to tolerate amiodarone with increased nausea/vomiting so this was discontinued 12/08/16.  I/O- 4.6 L over the past 24 hours, And weight down 16 pounds since admission. Cardiology feels that her heart rates will improve with diuresis and that they remain elevated due to her volume overloaded status.  Acute PE with right-heart strain  Pt was initially admitted to  hospital on 12/29 with suspected PNA. D-dimer was elevated and CTA featured RLL and RML PEs with CT-evidence for right heart strain. Due to interactions with tegretol, patient was started on coumadin/lovenox to bridge. INR now therapeutic.   Acute encephalopathy Improved with discontinuation of sedating medications.  Active problems:  Hypokalemia  Secondary to diuretics. Continue routine potassium supplementation.  Poor PO intake Dietitian consulted. Continue supplements per recommendations.  Decreased UOP Renal function stable. Renal ultrasound negative. Creatinine stable with diuresis.  Hypertension  Continue Toprol and diltiazem, controlled.  CAP Pt was admitted to [facility] on 12/29 with suspected CAP and received 5 days treatment with Rocephin and azithro prior to transfer here. Now off antibiotics   Seizure disorder  Continue Tegretol.  GERD/history of esophageal stricture/presbyesophagus/persistent nausea Continue Protonix. Patient reported ongoing nausea, dysphasia and regurgitation of food. Esophagram done 12/10/16 and showed a mild smooth lower esophageal stricture/evidence of GERD. Presbyesophagus noted. We'll start 5 mg of Reglan before every meal and HS given ongoing complaints of nausea.   Family Communication/Anticipated D/C date and plan/Code Status   DVT prophylaxis: Coumadin ordered. Code Status: Full Code.  Family Communication: Daughters and husband at the bedside. Disposition Plan: Likely will need SNF placement.   Medical Consultants:    Cardiology   Procedures:    None  Anti-Infectives:    None  Subjective:   The patient Continues to report nausea with poor oral intake. No complaints of chest pain or dyspnea. Feels palpitations at times. Lower extremity swelling is improving slowly. Continues with urinary frequency related to diuresis.  Objective:    Vitals:   12/10/16 1500 12/10/16 1700 12/10/16 2132 12/11/16 0625  BP: 124/72  137/83 130/80 139/78  Pulse: (!) 126 (!) 107 63 (!) 128  Resp:   18 18  Temp:   98 F (36.7 C) 98.3 F (36.8 C)  TempSrc:   Oral Oral  SpO2: 96%  95% 96%  Weight:    85.9 kg (189 lb 6 oz)  Height:        Intake/Output Summary (Last 24 hours) at 12/11/16 0753 Last data filed at 12/10/16 2212  Gross per 24 hour  Intake              120 ml  Output             4700 ml  Net            -4580 ml   Filed Weights   12/08/16 0420 12/09/16 0332 12/11/16 0625  Weight: 93.4 kg (205 lb 14.6 oz) 94.4 kg (208 lb 1.8 oz) 85.9 kg (189 lb 6 oz)    Exam: General exam: Appears More comfortable than yesterday, but still a bit restless. Respiratory system: Clear to auscultation. Respiratory effort labored. Cardiovascular system: Heart sounds are irregular. + JVD,  no rubs, gallops or clicks. No murmurs. Gastrointestinal system: Abdomen is distended,  nontender. No organomegaly or masses felt. Normal bowel sounds heard. Central nervous system: Alert and oriented x 2. No focal neurological deficits. Extremities: No clubbing,  or cyanosis. 2+ edema. Skin: No rashes, lesions or ulcers. Psychiatry: Judgement and insight appear mildly impaired. Mood & affect anxious.   Data Reviewed:   I have personally reviewed following labs and imaging studies:  Labs: Basic Metabolic Panel:  Recent Labs Lab 12/07/16 0815 12/08/16 0832 12/09/16 0430 12/10/16 0523 12/11/16 0451  NA 139 140 139 140 140  K 5.0 4.6 4.2 3.4* 3.6  CL 109 111 112* 106 100*  CO2 22 20* 23 25 28   GLUCOSE 117* 119* 120* 98 102*  BUN 24* 21* 19 13 11   CREATININE 1.31* 1.12* 1.02* 1.15* 1.06*  CALCIUM 8.1* 8.2* 8.1* 8.3* 8.6*   GFR Estimated Creatinine Clearance: 45.8 mL/min (by C-G formula based on SCr of 1.06 mg/dL (H)). Liver Function Tests:  Recent Labs Lab 12/05/16 2054  AST 30  ALT 33  ALKPHOS 85  BILITOT 0.6  PROT 6.1*  ALBUMIN 3.5    Recent Labs Lab 12/05/16 2054  LIPASE 22   Coagulation  profile  Recent Labs Lab 12/07/16 0815 12/08/16 0315 12/09/16 0430 12/10/16 0523 12/11/16 0451  INR 2.17 3.71 3.39 2.56 2.42    CBC:  Recent Labs Lab 12/07/16 0815 12/10/16 0523  WBC 7.4 4.5  HGB 11.7* 11.5*  HCT 36.8 35.1*  MCV 96.1 93.9  PLT 186 185   Cardiac Enzymes:  Recent Labs Lab 12/07/16 0815  TROPONINI <0.03    Microbiology No results found for this or any previous visit (from the past 240 hour(s)).  Radiology: Dg Esophagus  Result Date: 12/10/2016 CLINICAL DATA:  Dysphagia. Food sticking. History of esophageal dilatation in the past. EXAM: ESOPHOGRAM/BARIUM SWALLOW TECHNIQUE: Single contrast examination was performed using  thin barium. FLUOROSCOPY TIME:  Fluoroscopy Time:  1 minutes 24 seconds Radiation Exposure Index (if provided by the fluoroscopic device): 10.4 mGy Number of Acquired Spot Images: 1 COMPARISON:  None. FINDINGS: The patient had to be imaged LPO due to inability to stand. Oblique pharyngeal imaging was negative for aspiration or obstructive process. There is smooth mild narrowing of the lower esophagus which caused transient sticking of a 13 mm barium tablet. Recurrent gastroesophageal reflux was noted to the level of the mid and upper chest. No hiatal hernia. No  detected mucosal abnormality. Prior propulsion wave was weak, likely presbyesophagus. No specific dysmotility disorder. IMPRESSION: 1. Mild smooth lower esophageal stricture which caused transient sticking of a 13 mm barium tablet. Narrowing is likely peptic; gastroesophageal reflux was seen during this exam. 2. Presbyesophagus which may contribute to food sticking. Electronically Signed   By: Marnee Spring M.D.   On: 12/10/2016 14:44    Medications:   . atorvastatin  10 mg Oral q1800  . carbamazepine  200 mg Oral TID  . diltiazem  360 mg Oral Daily  . docusate sodium  100 mg Oral Daily  . feeding supplement (ENSURE ENLIVE)  237 mL Oral BID BM  . furosemide  40 mg Intravenous BID   . LORazepam  0.5 mg Intravenous Once  . metoprolol succinate  100 mg Oral BID  . pantoprazole  40 mg Oral Daily  . polyethylene glycol  17 g Oral Daily  . potassium chloride  20 mEq Oral BID  . sodium chloride flush  3 mL Intravenous Q12H  . Warfarin - Pharmacist Dosing Inpatient   Does not apply q1800   Continuous Infusions:   Medical decision making is of high complexity and this patient is at high risk of deterioration, therefore this is a level 3 visit.  (> 4 problem points, 3 data points, high risk)   Problems/DDx Points   Self limited or minor (max 2)       1   Established problem, stable       1   Established problem, worsening       2   New problem, no additional W/U planned (max 1)       3   New problem, additional W/U planned        4    Data Reviewed Points   Review/order clinical lab tests       1   Review/order x-rays       1   Review/order tests (Echo, EKG, PFTs, etc)       1   Discussion of test results w/ performing MD       1   Independent review of image, tracing or specimen       2   Decision to obtain old records       1   Review and summation of old records       2    Level of risk Presenting prob Diagnostics Management   Minimal 1 self limited/minor Labs CXR EKG/EEG U/A U/S Rest Gargles Bandages Dressings   Low 2 or more self limited/minor 1 stable chronic Acute uncomplicated illness Tests (PFTS) Non-CV imaging Arterial labs Biopsies of skin OTC drugs Minor surgery-no risk PT OT IVF without additives    Moderate 1 or more chronic illnesses w/ mild exac, progression or S/E from tx 2 or more stable chronic illnesses Undiagnosed new problem w/ uncertain prognosis Acute complicated injury  Stress tests Endoscopies with no risk factors Deep needle or incisional bx CV imaging without risk LP Thoracentesis Paracentesis Minor surgery w/ risks Elective major surgery w/ no risk (open, percutaneous or endoscopic) Prescription  drugs Therapeutic nucl med IVF with additives Closed tx of fracture/dislocation    High Severe exac of chronic illness Acute or chronic illness/injury may pose a threat to life or bodily function (ARF) Change in neuro status    CV imaging w/ contrast and risk Cardio electophysiologic tests Endoscopies w/ risk Discography Elective major surgery Emergency major surgery Parenteral controlled substances Drug therapy  req monitoring for toxicity DNR/de-escalation of care    MDM Prob points Data points Risk   Straightforward    <1    <1    Min   Low complexity    2    2    Low   Moderate    3    3    Mod   High Complexity    4 or more    4 or more    High      LOS: 10 days   Karen Richardson  [facility] Pager [phone]. If unable to reach me by pager, please call my cell phone at [phone].  *Please refer to [email or web address], password [email or web address] to get updated schedule on who will round on this patient, as hospitalists switch teams weekly. If 7PM-7AM, please contact night-coverage at [email or web address], password [email or web address] for any overnight needs.  12/11/2016, 7:53 AM

## 2016-12-11 NOTE — Progress Notes (Signed)
Patient Name: Karen Richardson Date of Encounter: 12/11/2016  Primary Cardiologist: Dr. Dewayne Hatch Problem List     Principal Problem:   Atrial fibrillation with RVR [facility]) Active Problems:   Hypertension   Seizures (HCC)   Pulmonary emboli (HCC)   GERD (gastroesophageal reflux disease)   AKI (acute kidney injury) (HCC)   Decreased urine output   Hypokalemia   History of esophageal stricture   Dysphagia   Respiratory distress    Patient Profile     83 year old female, quite active previously on farm, was admitted 01/02 with bilateral pulmonary embolism, atrial fibrillation, tricuspid regurgitation, secondary pulmonary hypertension. Hx HTN and seizure disorder on Tegretol   Subjective   Feels poorly today, mainly due to persistent nausea.   Inpatient Medications    Scheduled Meds: . atorvastatin  10 mg Oral q1800  . carbamazepine  200 mg Oral TID  . diltiazem  360 mg Oral Daily  . docusate sodium  100 mg Oral Daily  . feeding supplement (ENSURE ENLIVE)  237 mL Oral BID BM  . furosemide  40 mg Intravenous BID  . LORazepam  0.5 mg Intravenous Once  . metoprolol succinate  100 mg Oral BID  . pantoprazole  40 mg Oral Daily  . polyethylene glycol  17 g Oral Daily  . potassium chloride  20 mEq Oral BID  . sodium chloride flush  3 mL Intravenous Q12H  . warfarin  2.5 mg Oral ONCE-1800  . Warfarin - Pharmacist Dosing Inpatient   Does not apply q1800   Continuous Infusions:  PRN Meds: sodium chloride, acetaminophen, metoCLOPramide (REGLAN) injection, ondansetron (ZOFRAN) IV, sodium chloride flush   Vital Signs    Vitals:   12/10/16 1700 12/10/16 2132 12/11/16 0625 12/11/16 0927  BP: 137/83 130/80 139/78 134/83  Pulse: (!) 107 63 (!) 128 87  Resp:  18 18 18   Temp:  98 F (36.7 C) 98.3 F (36.8 C) 97.7 F (36.5 C)  TempSrc:  Oral Oral Oral  SpO2:  95% 96% 94%  Weight:   189 lb 6 oz (85.9 kg)   Height:        Intake/Output Summary (Last 24 hours) at  12/11/16 1030 Last data filed at 12/11/16 0700  Gross per 24 hour  Intake                0 ml  Output             5260 ml  Net            -5260 ml   Filed Weights   12/08/16 0420 12/09/16 0332 12/11/16 0625  Weight: 205 lb 14.6 oz (93.4 kg) 208 lb 1.8 oz (94.4 kg) 189 lb 6 oz (85.9 kg)    Physical Exam   GEN: Well nourished, well developed, elderly HEENT: Grossly normal.  Neck: Supple, no JVD, carotid bruits, or masses. Cardiac: irregularly irregula , tachy rate no murmurs, rubs, or gallops. No clubbing, cyanosis, 2+ bilateral LEE pitting edema.  Radials/DP/PT 2+ and equal bilaterally.  Respiratory:  Respirations regular and unlabored, clear to auscultation bilaterally. GI: Soft, nontender, nondistended, BS + x 4. MS: no deformity or atrophy. Skin: warm and dry, no rash. Neuro:  Strength and sensation are intact. Psych: AAOx3.  Normal affect.  Labs    CBC  Recent Labs  12/10/16 0523  WBC 4.5  HGB 11.5*  HCT 35.1*  MCV 93.9  PLT 185   Basic Metabolic Panel  Recent Labs  12/10/16 0523 12/11/16 0451  NA 140 140  K 3.4* 3.6  CL 106 100*  CO2 25 28  GLUCOSE 98 102*  BUN 13 11  CREATININE 1.15* 1.06*  CALCIUM 8.3* 8.6*   Liver Function Tests No results for input(s): AST, ALT, ALKPHOS, BILITOT, PROT, ALBUMIN in the last 72 hours. No results for input(s): LIPASE, AMYLASE in the last 72 hours. Cardiac Enzymes No results for input(s): CKTOTAL, CKMB, CKMBINDEX, TROPONINI in the last 72 hours. BNP Invalid input(s): POCBNP D-Dimer No results for input(s): DDIMER in the last 72 hours. Hemoglobin A1C No results for input(s): HGBA1C in the last 72 hours. Fasting Lipid Panel No results for input(s): CHOL, HDL, LDLCALC, TRIG, CHOLHDL, LDLDIRECT in the last 72 hours. Thyroid Function Tests No results for input(s): TSH, T4TOTAL, T3FREE, THYROIDAB in the last 72 hours.  Invalid input(s): FREET3  Telemetry    Atrial fibrillation. Rate in the 110s.    Radiology     Dg Esophagus  Result Date: 12/10/2016 CLINICAL DATA:  Dysphagia. Food sticking. History of esophageal dilatation in the past. EXAM: ESOPHOGRAM/BARIUM SWALLOW TECHNIQUE: Single contrast examination was performed using  thin barium. FLUOROSCOPY TIME:  Fluoroscopy Time:  1 minutes 24 seconds Radiation Exposure Index (if provided by the fluoroscopic device): 10.4 mGy Number of Acquired Spot Images: 1 COMPARISON:  None. FINDINGS: The patient had to be imaged LPO due to inability to stand. Oblique pharyngeal imaging was negative for aspiration or obstructive process. There is smooth mild narrowing of the lower esophagus which caused transient sticking of a 13 mm barium tablet. Recurrent gastroesophageal reflux was noted to the level of the mid and upper chest. No hiatal hernia. No detected mucosal abnormality. Prior propulsion wave was weak, likely presbyesophagus. No specific dysmotility disorder. IMPRESSION: 1. Mild smooth lower esophageal stricture which caused transient sticking of a 13 mm barium tablet. Narrowing is likely peptic; gastroesophageal reflux was seen during this exam. 2. Presbyesophagus which may contribute to food sticking. Electronically Signed   By: Marnee Spring M.D.   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 Hx HTN and seizure disorder on Tegretol   Assessment & Plan    1. ATRIAL FIB WITH RVR: Ventricular rate in the 110s currently on telemetry. Amiodarone was discontinued 2/2 nausea. She remains on rate control meds, metoprolol 100 mg BID and Cardizem 360 mg.CHA2DS2 - VASc score of 6. She is on Coumadin. INR is therapeutic at 2.4. Continue current therapy for now. Her rate is likely up secondary to her increased volume. Continue diuresis.   2. ACUTE PULMONARY EMBOLISM: On warfarin.  INR is therapeutic at 2.42.Lovenox stopped.   3. HTN: Controlled on current regimen. 134/83 most recent BP.   4. VOLUME OVERLOAD: ?acute diastolic CHF? Admit weight 195. Now improved today at 189 lb. I/Onet negative -2.3L. She continues to have significant bilateral LE pitting edema on exam. She also notes abdominal distention and nausea. We will continue IV lasix. Renal function and K both stable. Continue 40 mg IV BID. Follow strict I/Os, daily weights.   5. HYPOKALEMIA: 2/2 diuresis. Improved and WNL today at 3.6. Continue to monitor.   Signed, Robbie Lis, PA-C  12/11/2016, 10:30 AM   History and all data above reviewed.  Patient examined.  I agree with the findings as above.  No chest pain.  Feels palpitations but rate  is OK.  Ambulated.  Still with nausea.  The patient exam reveals [id number]:[id number]  ,  Lungs: Clear  ,  Abd: Positive bowel sounds, no rebound no guarding, Ext Mild edema  .  All available labs, radiology testing, previous records reviewed. Agree with documented assessment and plan. Atrial fib:  Continue current meds for rate control and warfarin.  Might need out patient DCCV if she has increased rates following discharge or still feels her heart beating fast.  Edema:  Improved.  Continue diuresis.    Fayrene Fearing Mikiala Fugett  1:35 PM  12/11/2016

## 2016-12-12 ENCOUNTER — Encounter (HOSPITAL_COMMUNITY): Payer: Self-pay | Admitting: Internal Medicine

## 2016-12-12 DIAGNOSIS — N183 Chronic kidney disease, stage 3 unspecified: Secondary | ICD-10-CM | POA: Diagnosis present

## 2016-12-12 DIAGNOSIS — G40909 Epilepsy, unspecified, not intractable, without status epilepticus: Secondary | ICD-10-CM

## 2016-12-12 HISTORY — DX: Chronic kidney disease, stage 3 unspecified: N18.30

## 2016-12-12 HISTORY — DX: Epilepsy, unspecified, not intractable, without status epilepticus: G40.909

## 2016-12-12 LAB — BASIC METABOLIC PANEL
ANION GAP: 11 (ref 5–15)
BUN: 9 mg/dL (ref 6–20)
CHLORIDE: 96 mmol/L — AB (ref 101–111)
CO2: 30 mmol/L (ref 22–32)
CREATININE: 1.05 mg/dL — AB (ref 0.44–1.00)
Calcium: 9 mg/dL (ref 8.9–10.3)
GFR calc non Af Amer: 49 mL/min — ABNORMAL LOW (ref >60)
GFR, EST AFRICAN AMERICAN: 57 mL/min — AB (ref >60)
Glucose, Bld: 92 mg/dL (ref 65–99)
Potassium: 3.6 mmol/L (ref 3.5–5.1)
SODIUM: 137 mmol/L (ref 135–145)

## 2016-12-12 LAB — PROTIME-INR
INR: 2.45
Prothrombin Time: 27 seconds — ABNORMAL HIGH (ref 11.4–15.2)

## 2016-12-12 MED ORDER — POTASSIUM CHLORIDE CRYS ER 10 MEQ PO TBCR: 10.0000 meq | EXTENDED_RELEASE_TABLET | Freq: Two times a day (BID) | ORAL | Status: DC

## 2016-12-12 MED ORDER — METOPROLOL SUCCINATE ER 100 MG PO TB24: 100.0000 mg | ORAL_TABLET | Freq: Two times a day (BID) | ORAL | Status: DC

## 2016-12-12 MED ORDER — POLYETHYLENE GLYCOL 3350 17 G PO PACK: 17.0000 g | PACK | Freq: Every day | ORAL | 0 refills | Status: DC

## 2016-12-12 MED ORDER — ENSURE ENLIVE PO LIQD: 237.0000 mL | Freq: Two times a day (BID) | ORAL | 12 refills | Status: DC

## 2016-12-12 MED ORDER — DILTIAZEM HCL ER COATED BEADS 360 MG PO CP24: 360.0000 mg | ORAL_CAPSULE | Freq: Every day | ORAL | Status: DC

## 2016-12-12 MED ORDER — CARBAMAZEPINE 200 MG PO TABS: 200.0000 mg | ORAL_TABLET | Freq: Three times a day (TID) | ORAL | Status: DC

## 2016-12-12 MED ORDER — WARFARIN SODIUM 2.5 MG PO TABS: 2.5000 mg | ORAL_TABLET | Freq: Every day | ORAL | Status: DC

## 2016-12-12 MED ORDER — FUROSEMIDE 40 MG PO TABS: 40.0000 mg | ORAL_TABLET | Freq: Every day | ORAL | Status: DC

## 2016-12-12 NOTE — Clinical Social Work Note (Signed)
Clinical Social Worker notified patient is ready to DC, CSW attempted to facilitate patient discharge including contacting patient family and facility to confirm patient discharge plans and bed availability.   CSW was informed by [facility] that pt no longer has a bed, the facility was anticipating pt coming last week and no bed is available now.  CSW spoke to family about 2nd SNF option, the family selected [facility]. CSW contacted facility and spoke to nurse Kendrick Fries, this pt is not on new admit list and facility cannot take pt until Monday. CSW notified family. The family will not consider [facility] due to pt's dtr visiting facility and her total dis-satisfaction. CSW went over facilities in [place], dtr reports pt's spouse is [address] and cannot drive to [place]. Dtr here visiting from [place] in hopes of getting pt placed this weekend. CSW will continue to make efforts on placement this weekend.  Karen Richardson B. Gean Quint Clinical Social Work Dept [employer] Social Worker [phone] 4:25 PM

## 2016-12-12 NOTE — Discharge Summary (Addendum)
Please note that the patient did not end up being discharged on this date due to lack of availability of SNF beds. The patient was without any new complaints during my visit.  Physician Discharge Summary  Karen Richardson WJX:[id number] DOB: 1934-07-17 DOA: 12/01/2016  PCP: Ignatius Specking, MD  Admit date: 12/01/2016 Discharge date: 12/12/2016  Admitted From: Home Discharge disposition: SNF   Recommendations for Outpatient Follow-Up:   1. Recommend close F/U of INR and adjustment of coumadin dose as needed. 2. Recommend close F/U of volume status.   Discharge Diagnosis:   Principal Problem:    Atrial fibrillation with RVR (HCC) Active Problems:    Hypertension    Seizures (HCC)    Pulmonary emboli (HCC)    GERD (gastroesophageal reflux disease)    AKI (acute kidney injury) (HCC)    Decreased urine output    Hypokalemia    History of esophageal stricture    Dysphagia    Respiratory distress    Presbyesophagus    Volume overload    Stage III CKD   Discharge Condition: Improved.  Diet recommendation: Low sodium, heart healthy.    History of Present Illness:   HAELEE Richardson is an 83 y.o. female with medical history significant forhypertension and seizure disorder who presents in transfer from [facility] for management of atrial fibrillation with RVR. Patient states that she has been experiencing generalized weakness, fatigue with minimal daily activity. She then progressed to have exertional dyspnea, which prompted evaluation in the emergency department. On presentation to [facility] on 11/27/2016, she was in atrial fibrillation with RVR. She was initially treated with IV Cardizem. She was also given Rocephin and azithromycin for possible pneumonia. D-dimer was elevated and CT chest revealed right lower and middle lobe pulmonary emboli with evidence for heart strain. Patient continued to have rapid rates intermittently in the hospital and was  eventually transferred to [facility] for cardiology evaluation.   A Fib treated with amiodarone, cardizem, metoprolol. PE treated with lovenox/coumadin and INR now therapeutic. Hospitalization has been complicated by encephalopathy, weakness, decreased oral intake, nausea, vomiting, decreased urine output.    Hospital Course by Problem:   Principal problems:  Atrial fibrillation with RVR, new onset, complicated by volume overload CHADSVASc 6, currently on Coumadin with a therapeutic INR. Heart rate controlled on Cardizem and metoprolol. Blood pressure stable. Patient was unable to tolerate amiodarone with increased nausea/vomiting so this was discontinued 12/08/16. The patient was diuresed and her weight is down 28 pounds.  Will d/c on lasix 40 mg daily.    Acute PE with right-heart strain  Pt was initially admitted to hospital on 12/29 with suspected PNA. D-dimer was elevated and CTA featured RLL and RML PEs with CT-evidence for right heart strain. Due to interactions with tegretol, patient was started on coumadin/lovenox to bridge. INR now therapeutic. Monitor INR closely.  Acute encephalopathy Resolved with discontinuation of sedating medications.  Active problems:  Hypokalemia  Secondary to diuretics. Continue routine potassium supplementation.  Poor PO intake Dietitian consulted. Continue supplements per recommendations.  Stage III CKD Creatinine stable with diuresis, renal ultrasound WNL.    Hypertension  Continue Lasix, Toprol and diltiazem, controlled.  CAP Pt was admitted to [facility] on 12/29 with suspected CAP and received 5 days treatment with Rocephin and azithro prior to transfer here.  Seizure disorder  Continue Tegretol.  GERD/history of esophageal stricture/presbyesophagus/persistent nausea Continue Protonix. Patient reported ongoing nausea, dysphasia and regurgitation of food. Esophagram done 12/10/16  and showed a mild smooth lower esophageal  stricture/evidence of GERD. Presbyesophagus noted. Nausea resolved. Intolerant of Reglan.      Medical Consultants:    Cardiology: Quintella Reichert, MD   Discharge Exam:   Vitals:   12/12/16 0512 12/12/16 0951  BP: 139/85 114/60  Pulse: 86 96  Resp: 18 18  Temp: 97.5 F (36.4 C) 97.7 F (36.5 C)   Vitals:   12/11/16 1420 12/11/16 2007 12/12/16 0512 12/12/16 0951  BP: (!) 136/26 121/77 139/85 114/60  Pulse: 95 89 86 96  Resp: 18 18 18 18   Temp:  97.9 F (36.6 C) 97.5 F (36.4 C) 97.7 F (36.5 C)  TempSrc:  Oral Oral Oral  SpO2: 94% 93% (!) 5% 98%  Weight:   82 kg (180 lb 12.4 oz)   Height:       General exam: NAD. Respiratory system: Clear to auscultation. Respiratory effort unlabored. Cardiovascular system: Heart sounds are irregular. + JVD,  no rubs, gallops or clicks. No murmurs. Gastrointestinal system: Abdomen is non-distended, nontender. No organomegaly or masses felt. Normal bowel sounds heard. Central nervous system: Alert and oriented x 3. No focal neurological deficits. Extremities: No clubbing,  or cyanosis. Trace edema. Skin: No rashes, lesions or ulcers. Psychiatry: Judgement and insight appear mildly impaired. Mood & affect normal.    The results of significant diagnostics from this hospitalization (including imaging, microbiology, ancillary and laboratory) are listed below for reference.     Procedures and Diagnostic Studies:   Dg Chest Port 1 View  Result Date: 12/02/2016 CLINICAL DATA:  Respiratory distress EXAM: PORTABLE CHEST 1 VIEW COMPARISON:  None. FINDINGS: Non inclusion of the extreme lung bases. There is mild cardiomegaly. Diffuse mild interstitial prominence could relate to mild interstitial infiltrates or edema. Streaky atelectasis or infiltrate at the left base. No pneumothorax. IMPRESSION: 1. Cardiomegaly. 2. Mild interstitial opacities could reflect interstitial infiltrates or mild edema. Streaky atelectasis versus mild infiltrate at  the left base. Electronically Signed   By: Jasmine Pang M.D.   On: 12/02/2016 01:47     Labs:   Basic Metabolic Panel:  Recent Labs Lab 12/08/16 0832 12/09/16 0430 12/10/16 0523 12/11/16 0451 12/12/16 0527  NA 140 139 140 140 137  K 4.6 4.2 3.4* 3.6 3.6  CL 111 112* 106 100* 96*  CO2 20* 23 25 28 30   GLUCOSE 119* 120* 98 102* 92  BUN 21* 19 13 11 9   CREATININE 1.12* 1.02* 1.15* 1.06* 1.05*  CALCIUM 8.2* 8.1* 8.3* 8.6* 9.0   GFR Estimated Creatinine Clearance: 45.2 mL/min (by C-G formula based on SCr of 1.05 mg/dL (H)). Liver Function Tests:  Recent Labs Lab 12/05/16 2054  AST 30  ALT 33  ALKPHOS 85  BILITOT 0.6  PROT 6.1*  ALBUMIN 3.5    Recent Labs Lab 12/05/16 2054  LIPASE 22   No results for input(s): AMMONIA in the last 168 hours. Coagulation profile  Recent Labs Lab 12/08/16 0315 12/09/16 0430 12/10/16 0523 12/11/16 0451 12/12/16 0527  INR 3.71 3.39 2.56 2.42 2.45    CBC:  Recent Labs Lab 12/07/16 0815 12/10/16 0523  WBC 7.4 4.5  HGB 11.7* 11.5*  HCT 36.8 35.1*  MCV 96.1 93.9  PLT 186 185   Cardiac Enzymes:  Recent Labs Lab 12/07/16 0815  TROPONINI <0.03     Discharge Instructions:   Discharge Instructions    (HEART FAILURE PATIENTS) Call MD:  Anytime you have any of the following symptoms: 1) 3 pound weight gain  in 24 hours or 5 pounds in 1 week 2) shortness of breath, with or without a dry hacking cough 3) swelling in the hands, feet or stomach 4) if you have to sleep on extra pillows at night in order to breathe.    Complete by:  As directed    Call MD for:  extreme fatigue    Complete by:  As directed    Call MD for:  persistant dizziness or light-headedness    Complete by:  As directed    Diet - low sodium heart healthy    Complete by:  As directed    Increase activity slowly    Complete by:  As directed    Walk with assistance    Complete by:  As directed    Walker     Complete by:  As directed      Allergies  as of 12/12/2016      Reactions   Codeine Nausea Only      Medication List    STOP taking these medications   amLODipine 5 MG tablet Commonly known as:  NORVASC   amoxicillin-clavulanate 500-125 MG tablet Commonly known as:  AUGMENTIN   benazepril 40 MG tablet Commonly known as:  LOTENSIN     TAKE these medications   albuterol 108 (90 Base) MCG/ACT inhaler Commonly known as:  PROVENTIL HFA;VENTOLIN HFA Inhale 1 puff into the lungs every 6 (six) hours as needed for wheezing or shortness of breath.   atorvastatin 10 MG tablet Commonly known as:  LIPITOR Take 10 mg by mouth daily.   carbamazepine 200 MG tablet Commonly known as:  TEGRETOL Take 1 tablet (200 mg total) by mouth 3 (three) times daily. What changed:  when to take this   diltiazem 360 MG 24 hr capsule Commonly known as:  CARDIZEM CD Take 1 capsule (360 mg total) by mouth daily. Start taking on:  12/13/2016   feeding supplement (ENSURE ENLIVE) Liqd Take 237 mLs by mouth 2 (two) times daily between meals.   furosemide 40 MG tablet Commonly known as:  LASIX Take 1 tablet (40 mg total) by mouth daily.   metoprolol succinate 100 MG 24 hr tablet Commonly known as:  TOPROL-XL Take 1 tablet (100 mg total) by mouth 2 (two) times daily. Take with or immediately following a meal. What changed:  medication strength  how much to take  when to take this  additional instructions   pantoprazole 40 MG tablet Commonly known as:  PROTONIX Take 40 mg by mouth daily.   polyethylene glycol packet Commonly known as:  MIRALAX / GLYCOLAX Take 17 g by mouth daily. Start taking on:  12/13/2016   potassium chloride 10 MEQ tablet Commonly known as:  K-DUR,KLOR-CON Take 1 tablet (10 mEq total) by mouth 2 (two) times daily.   warfarin 2.5 MG tablet Commonly known as:  COUMADIN Take 1 tablet (2.5 mg total) by mouth daily.         Time coordinating discharge: 35 minutes.  Signed:  RAMA,CHRISTINA  Pager  [phone] [facility] 12/12/2016, 10:33 AM

## 2016-12-13 LAB — CBC
HCT: 39.5 % (ref 36.0–46.0)
HEMOGLOBIN: 13.1 g/dL (ref 12.0–15.0)
MCH: 31.5 pg (ref 26.0–34.0)
MCHC: 33.2 g/dL (ref 30.0–36.0)
MCV: 95 fL (ref 78.0–100.0)
PLATELETS: 201 10*3/uL (ref 150–400)
RBC: 4.16 MIL/uL (ref 3.87–5.11)
RDW: 15.9 % — ABNORMAL HIGH (ref 11.5–15.5)
WBC: 6.1 10*3/uL (ref 4.0–10.5)

## 2016-12-13 LAB — PROTIME-INR
INR: 2.02
PROTHROMBIN TIME: 23.2 s — AB (ref 11.4–15.2)

## 2016-12-13 MED ORDER — FUROSEMIDE 40 MG PO TABS
40.0000 mg | ORAL_TABLET | Freq: Every day | ORAL | Status: DC
  Administered 2016-12-14: 40 mg via ORAL
  Filled 2016-12-13: qty 1

## 2016-12-13 MED ORDER — POTASSIUM CHLORIDE CRYS ER 20 MEQ PO TBCR
20.0000 meq | EXTENDED_RELEASE_TABLET | Freq: Every day | ORAL | Status: DC
  Filled 2016-12-13: qty 1

## 2016-12-13 MED ORDER — WARFARIN SODIUM 5 MG PO TABS
5.0000 mg | ORAL_TABLET | Freq: Once | ORAL | Status: AC
  Administered 2016-12-13: 5 mg via ORAL
  Filled 2016-12-13: qty 1

## 2016-12-13 NOTE — Progress Notes (Signed)
ANTICOAGULATION CONSULT NOTE - Follow Up Consult  Pharmacy Consult for Coumadin Indication: acute PE and afib  Allergies  Allergen Reactions  . Codeine Nausea Only    Patient Measurements: Height: 5\' 5"  (165.1 cm) Weight: 175 lb 4.8 oz (79.5 kg) IBW/kg (Calculated) : 57 Heparin Dosing Weight: 76 kg  Vital Signs: Temp: 97.6 F (36.4 C) (01/14 1039) Temp Source: Oral (01/14 1039) BP: 115/78 (01/14 1039) Pulse Rate: 88 (01/14 1039)  Assessment: 82yof transferred from [facility]. Eliquis 5mg  po BID started at [facility] for afib 12/30. CT showed acute PE so dose increased to 10mg  BID on 12/31 - last dose 1/2 a.m. Was then transitioned to IV heparin. Since pt on carbamazepine (can decrease apixaban concentration - not recommended to use together), pharmacy consulted to transition to coumadin with lovenox bridge on 1/4. Lovenox stopped 1/9 with elevated INR. Coumadin doses held 1/8 and 1/9, resumed 1/10. Amiodarone d/c'ed 1/8.  INR therapeutic today at 2.02. Plan was to discharge yesterday so coumadin dose not ordered. Patient ended up staying as her SNF bed was no longer available. She consequently missed her coumadin dose yesterday. Will increase today's dose to prevent INR from falling.   Goal of Therapy:  INR 2-3 Monitor platelets by anticoagulation protocol: Yes   Plan:  1) Coumadin 5mg  x 1 2) Daily INR  Louie Casa, PharmD, BCPS 12/13/2016 10:47 AM

## 2016-12-13 NOTE — Progress Notes (Signed)
Progress Note    Karen Richardson  RUE:[id number] DOB: 29-Oct-1934  DOA: 12/01/2016 PCP: Ignatius Specking, MD    Brief Narrative:   Chief complaint: F/U afib  Karen Richardson is an 83 y.o. female with medical history significant forhypertension and seizure disorder who presents in transfer from [facility] for management of atrial fibrillation with RVR. Patient states that she has been experiencing generalized weakness, fatigue with minimal daily activity. She then progressed to have exertional dyspnea, which prompted evaluation in the emergency department. On presentation to [facility] on 11/27/2016, she was in atrial fibrillation with RVR. She was initially treated with IV Cardizem. She was also given Rocephin and azithromycin for possible pneumonia. D-dimer was elevated and CT chest revealed right lower and middle lobe pulmonary emboli with evidence for heart strain. Patient continued to have rapid rates intermittently in the hospital and was eventually transferred to [facility] for cardiology evaluation.   A Fib treated with amiodarone, cardizem, metoprolol. PE treated with lovenox/coumadin and INR now therapeutic. Hospitalization has been complicated by encephalopathy, weakness, decreased oral intake, nausea, vomiting, decreased urine output.   Assessment/Plan:   Principal problems:  Atrial fibrillation with RVR, new onset, now with volume overload CHADSVASc 6, currently on Coumadin with a therapeutic INR. Heart rate controlled on Cardizem and metoprolol. Blood pressure stable. Patient was unable to tolerate amiodarone with increased nausea/vomiting so this was discontinued 12/08/16.  I/O- 4.6 L over the past 24 hours, and weight down 16 pounds since admission. Stable for discharge to SNF, but no beds currently available.  Acute PE with right-heart strain  Pt was initially admitted to hospital on 12/29 with suspected PNA. D-dimer was elevated and CTA featured RLL  and RML PEs with CT-evidence for right heart strain. Due to interactions with tegretol, patient was started on coumadin/lovenox to bridge. INR now therapeutic.   Acute encephalopathy Resolved with discontinuation of sedating medications.  Active problems:  Hypokalemia  Secondary to diuretics. Continue routine potassium supplementation.  Poor PO intake Dietitian consulted. Continue supplements per recommendations.  Decreased UOP Resolved. Renal function stable. Renal ultrasound negative. Creatinine stable with diuresis.  Hypertension  Continue Toprol and diltiazem, controlled.  CAP Pt was admitted to [facility] on 12/29 with suspected CAP and received 5 days treatment with Rocephin and azithro prior to transfer here. Now off antibiotics   Seizure disorder  Continue Tegretol.  GERD/history of esophageal stricture/presbyesophagus/persistent nausea Continue Protonix. Patient reported ongoing nausea, dysphasia and regurgitation of food. Esophagram done 12/10/16 and showed a mild smooth lower esophageal stricture/evidence of GERD. Presbyesophagus noted. Nausea resolved.   Family Communication/Anticipated D/C date and plan/Code Status   DVT prophylaxis: Coumadin ordered. Code Status: Full Code.  Family Communication: Daughter at the bedside. Disposition Plan: SNF when bed available.   Medical Consultants:    Cardiology   Procedures:    None  Anti-Infectives:    None  Subjective:   Continues to report some dysphagia when swallowing pills. Nausea resolved. Appetite improving. No dyspnea.  Objective:    Vitals:   12/12/16 2111 12/13/16 0510 12/13/16 1039 12/13/16 1315  BP: 133/79 134/79 115/78 138/82  Pulse: 73 88 88 87  Resp: 20 18 18 20   Temp: 97.5 F (36.4 C) 97.4 F (36.3 C) 97.6 F (36.4 C) 98 F (36.7 C)  TempSrc: Oral Oral Oral Oral  SpO2: 97% 100% 94% 97%  Weight:  79.5 kg (175 lb 4.8 oz)    Height:  Intake/Output Summary (Last 24  hours) at 12/13/16 1459 Last data filed at 12/13/16 1414  Gross per 24 hour  Intake              720 ml  Output             1501 ml  Net             -781 ml   Filed Weights   12/11/16 0625 12/12/16 0512 12/13/16 0510  Weight: 85.9 kg (189 lb 6 oz) 82 kg (180 lb 12.4 oz) 79.5 kg (175 lb 4.8 oz)    Exam: General exam: No distress, animated. Respiratory system: Clear to auscultation. Respiratory effort unlabored. Cardiovascular system: Heart sounds are irregular. JVD improved,  no rubs, gallops or clicks. No murmurs. Gastrointestinal system: Abdomen is distended,  nontender. No organomegaly or masses felt. Normal bowel sounds heard. Central nervous system: Alert and oriented x 3. No focal neurological deficits. Extremities: No clubbing,  or cyanosis. Trace edema. Skin: No rashes, lesions or ulcers. Psychiatry: Judgement and insight appear mildly impaired. Mood & affect anxious.   Data Reviewed:   I have personally reviewed following labs and imaging studies:  Labs: Basic Metabolic Panel:  Recent Labs Lab 12/08/16 0832 12/09/16 0430 12/10/16 0523 12/11/16 0451 12/12/16 0527  NA 140 139 140 140 137  K 4.6 4.2 3.4* 3.6 3.6  CL 111 112* 106 100* 96*  CO2 20* 23 25 28 30   GLUCOSE 119* 120* 98 102* 92  BUN 21* 19 13 11 9   CREATININE 1.12* 1.02* 1.15* 1.06* 1.05*  CALCIUM 8.2* 8.1* 8.3* 8.6* 9.0   GFR Estimated Creatinine Clearance: 44.5 mL/min (by C-G formula based on SCr of 1.05 mg/dL (H)). Liver Function Tests: No results for input(s): AST, ALT, ALKPHOS, BILITOT, PROT, ALBUMIN in the last 168 hours. No results for input(s): LIPASE, AMYLASE in the last 168 hours. Coagulation profile  Recent Labs Lab 12/09/16 0430 12/10/16 0523 12/11/16 0451 12/12/16 0527 12/13/16 0520  INR 3.39 2.56 2.42 2.45 2.02    CBC:  Recent Labs Lab 12/07/16 0815 12/10/16 0523 12/13/16 0520  WBC 7.4 4.5 6.1  HGB 11.7* 11.5* 13.1  HCT 36.8 35.1* 39.5  MCV 96.1 93.9 95.0  PLT 186  185 201   Cardiac Enzymes:  Recent Labs Lab 12/07/16 0815  TROPONINI <0.03    Microbiology No results found for this or any previous visit (from the past 240 hour(s)).  Radiology: No results found.  Medications:   . atorvastatin  10 mg Oral q1800  . carbamazepine  200 mg Oral TID  . diltiazem  360 mg Oral Daily  . docusate sodium  100 mg Oral Daily  . feeding supplement (ENSURE ENLIVE)  237 mL Oral BID BM  . furosemide  40 mg Intravenous BID  . LORazepam  0.5 mg Intravenous Once  . metoCLOPramide  5 mg Oral TID AC & HS  . metoprolol succinate  100 mg Oral BID  . pantoprazole  40 mg Oral Daily  . polyethylene glycol  17 g Oral Daily  . potassium chloride  20 mEq Oral BID  . warfarin  5 mg Oral ONCE-1800  . Warfarin - Pharmacist Dosing Inpatient   Does not apply q1800   Continuous Infusions:   Medical decision making is of low complexity, > 4 problems, level I visit.   Problems/DDx Points   Self limited or minor (max 2)       1   Established problem, stable  1   Established problem, worsening       2   New problem, no additional W/U planned (max 1)       3   New problem, additional W/U planned        4    Data Reviewed Points   Review/order clinical lab tests       1   Review/order x-rays       1   Review/order tests (Echo, EKG, PFTs, etc)       1   Discussion of test results w/ performing MD       1   Independent review of image, tracing or specimen       2   Decision to obtain old records       1   Review and summation of old records       2    Level of risk Presenting prob Diagnostics Management   Minimal 1 self limited/minor Labs CXR EKG/EEG U/A U/S Rest Gargles Bandages Dressings   Low 2 or more self limited/minor 1 stable chronic Acute uncomplicated illness Tests (PFTS) Non-CV imaging Arterial labs Biopsies of skin OTC drugs Minor surgery-no risk PT OT IVF without additives    Moderate 1 or more chronic illnesses w/ mild exac,  progression or S/E from tx 2 or more stable chronic illnesses Undiagnosed new problem w/ uncertain prognosis Acute complicated injury  Stress tests Endoscopies with no risk factors Deep needle or incisional bx CV imaging without risk LP Thoracentesis Paracentesis Minor surgery w/ risks Elective major surgery w/ no risk (open, percutaneous or endoscopic) Prescription drugs Therapeutic nucl med IVF with additives Closed tx of fracture/dislocation    High Severe exac of chronic illness Acute or chronic illness/injury may pose a threat to life or bodily function (ARF) Change in neuro status    CV imaging w/ contrast and risk Cardio electophysiologic tests Endoscopies w/ risk Discography Elective major surgery Emergency major surgery Parenteral controlled substances Drug therapy req monitoring for toxicity DNR/de-escalation of care    MDM Prob points Data points Risk   Straightforward    <1    <1    Min   Low complexity    2    2    Low   Moderate    3    3    Mod   High Complexity    4 or more    4 or more    High      LOS: 12 days   Marrah Vanevery  [facility] Pager [phone]. If unable to reach me by pager, please call my cell phone at [phone].  *Please refer to [email or web address], password [email or web address] to get updated schedule on who will round on this patient, as hospitalists switch teams weekly. If 7PM-7AM, please contact night-coverage at [email or web address], password [email or web address] for any overnight needs.  12/13/2016, 2:59 PM

## 2016-12-14 DIAGNOSIS — G40909 Epilepsy, unspecified, not intractable, without status epilepticus: Secondary | ICD-10-CM

## 2016-12-14 LAB — PROTIME-INR
INR: 1.54
Prothrombin Time: 18.7 seconds — ABNORMAL HIGH (ref 11.4–15.2)

## 2016-12-14 MED ORDER — WARFARIN SODIUM 2.5 MG PO TABS: 5.0000 mg | ORAL_TABLET | Freq: Every day | ORAL | Status: DC

## 2016-12-14 MED ORDER — METOPROLOL SUCCINATE ER 100 MG PO TB24: 100.0000 mg | ORAL_TABLET | Freq: Two times a day (BID) | ORAL | 2 refills | Status: DC

## 2016-12-14 MED ORDER — ENOXAPARIN SODIUM 80 MG/0.8ML ~~LOC~~ SOLN: 80.0000 mg | Freq: Two times a day (BID) | SUBCUTANEOUS | Status: DC

## 2016-12-14 MED ORDER — POTASSIUM CHLORIDE CRYS ER 10 MEQ PO TBCR: 10.0000 meq | EXTENDED_RELEASE_TABLET | Freq: Two times a day (BID) | ORAL | 2 refills | Status: DC

## 2016-12-14 MED ORDER — POLYETHYLENE GLYCOL 3350 17 G PO PACK: 17.0000 g | PACK | Freq: Every day | ORAL | 0 refills | Status: DC

## 2016-12-14 MED ORDER — DILTIAZEM HCL ER COATED BEADS 360 MG PO CP24: 360.0000 mg | ORAL_CAPSULE | Freq: Every day | ORAL | 2 refills | Status: DC

## 2016-12-14 MED ORDER — ENOXAPARIN SODIUM 80 MG/0.8ML ~~LOC~~ SOLN: 80.0000 mg | Freq: Two times a day (BID) | SUBCUTANEOUS | 0 refills | Status: DC

## 2016-12-14 MED ORDER — FUROSEMIDE 40 MG PO TABS: 40.0000 mg | ORAL_TABLET | Freq: Every day | ORAL | 2 refills | Status: DC

## 2016-12-14 MED ORDER — WARFARIN SODIUM 5 MG PO TABS
5.0000 mg | ORAL_TABLET | Freq: Once | ORAL | Status: AC
  Administered 2016-12-14: 5 mg via ORAL
  Filled 2016-12-14: qty 1

## 2016-12-14 MED ORDER — CARBAMAZEPINE 200 MG PO TABS: 200.0000 mg | ORAL_TABLET | Freq: Three times a day (TID) | ORAL | 0 refills | Status: DC

## 2016-12-14 MED ORDER — WARFARIN SODIUM 2.5 MG PO TABS: 5.0000 mg | ORAL_TABLET | Freq: Every day | ORAL | 0 refills | Status: DC

## 2016-12-14 MED ORDER — ENOXAPARIN SODIUM 80 MG/0.8ML ~~LOC~~ SOLN
80.0000 mg | Freq: Two times a day (BID) | SUBCUTANEOUS | Status: DC
  Administered 2016-12-14: 80 mg via SUBCUTANEOUS
  Filled 2016-12-14: qty 0.8

## 2016-12-14 NOTE — Progress Notes (Signed)
Physical Therapy Treatment Patient Details Name: HALSTON FAIRCLOUGH MRN: [id number] DOB: [birth year] Today's Date: 12/14/2016    History of Present Illness pt is an 83 y/o female admitted as a transfer from [facility] for management of Afib with RVR and acute PE.  PMH significant for HTN and seizures.     PT Comments    Pt with excellent progress in mobility able to easily walk 800' without fatigue, SoB, CP or LOB. Pt reports being eager to return home with only concern that her dogs will jump on her at home. Pt encouraged to have caretaker for animals (who has been assisting during her admission) continue to come, to use RW as aid to prevent fall and encouraged increased and continued gait. Pt moving well without need for ST-SNF at this time, spouse able to prepare meals and pt encouraged to sit at stool to wash dishes. Will follow acutely to return to independent function.   Hr 122-125 with gait sats 95% on RA  Follow Up Recommendations  Home health PT     Equipment Recommendations  None recommended by PT    Recommendations for Other Services       Precautions / Restrictions Precautions Precautions: Fall Restrictions Weight Bearing Restrictions: No    Mobility  Bed Mobility Overal bed mobility: Modified Independent                Transfers Overall transfer level: Modified independent                  Ambulation/Gait Ambulation/Gait assistance: Modified independent (Device/Increase time) Ambulation Distance (Feet): 800 Feet Assistive device: Rolling walker (2 wheeled) Gait Pattern/deviations: WFL(Within Functional Limits)   Gait velocity interpretation: at or above normal speed for age/gender General Gait Details: pt with good RW use with cue x 1 to step into RW. pt able to walk at a good pace throughout without change to balance, fatigue or HR.    Stairs            Wheelchair Mobility    Modified Rankin (Stroke Patients Only)        Balance     Sitting balance-Leahy Scale: Good       Standing balance-Leahy Scale: Good                      Cognition Arousal/Alertness: Awake/alert Behavior During Therapy: WFL for tasks assessed/performed Overall Cognitive Status: Within Functional Limits for tasks assessed                      Exercises General Exercises - Lower Extremity Long Arc Quad: AROM;Both;Seated;15 reps Hip Flexion/Marching: AROM;Both;Seated;15 reps    General Comments        Pertinent Vitals/Pain Pain Assessment: No/denies pain    Home Living                      Prior Function            PT Goals (current goals can now be found in the care plan section) Acute Rehab PT Goals Time For Goal Achievement: 12/21/16 Potential to Achieve Goals: Good Progress towards PT goals: Goals met and updated - see care plan    Frequency           PT Plan Discharge plan needs to be updated    Co-evaluation             End of Session   Activity Tolerance: Patient  tolerated treatment well Patient left: in chair;with call bell/phone within reach     Time: 1253-1309 PT Time Calculation (min) (ACUTE ONLY): 16 min  Charges:  $Gait Training: 8-22 mins                    G Codes:      Destyne Goodreau B Rush Salce 01/03/2017, 1:14 PM Elwyn Reach, [place]

## 2016-12-14 NOTE — Care Management Note (Signed)
Case Management Note Donn Pierini RN, BSN Unit 2W-Case Manager [phone]  Patient Details  Name: Karen Richardson MRN: [id number] Date of Birth: 05/23/1934  Subjective/Objective:  Pt admitted as tx from [facility] with afib/PE                  Action/Plan: PTA pt lived at home- anticipate return home- per PT/OT evals- recommendations for [facility]- awaiting MD orders. CM to follow for d/c needs  Expected Discharge Date:  12/14/16               Expected Discharge Plan:  Skilled Nursing Facility  In-House Referral:  Clinical Social Work  Discharge planning Services  CM Consult  Post Acute Care Choice:  Home Health Choice offered to:     DME Arranged:    DME Agency:     HH Arranged:    HH Agency:     Status of Service:  Completed, signed off  If discussed at [employer], dates discussed:    Discharge Disposition: skilled facility   Additional Comments:  12/14/16- 1015- Jovane Foutz RN, CM- pt stable for d/c today to SNF- plan for [facility]- CSW following for placement needs.   12/11/16- 1400- Kollyns Mickelson RN, CM- pt remains vol. Overloaded- per MD not ready for SNF- CSW continues to follow for placement.   12/07/16- 1000- Donn Pierini- family requesting STSNF for pt- per CSW pt is agreeable to SNF- PT re-eval today recommendations now updated to SNF- CSW will follow for STSNF placement when medically stable    Zenda Alpers Lenn Sink, RN 12/14/2016, 10:15 AM

## 2016-12-14 NOTE — Discharge Summary (Addendum)
Physician Discharge Summary  Karen Richardson DIY:[id number] DOB: Jul 31, 1934 DOA: 12/01/2016  PCP: Ignatius Specking, MD  Admit date: 12/01/2016 Discharge date: 12/14/2016  Admitted From: Home Discharge disposition: Home   Recommendations for Outpatient Follow-Up:   1. Recommend close F/U of INR and adjustment of coumadin dose as needed.Home health nurse will check daily PT/INR and fax to cardiologist. 2. Recommend close F/U of volume status. 3. Home health PT/RN set up.     Discharge Diagnosis:   Principal Problem:    Atrial fibrillation with RVR (HCC) Active Problems:    Hypertension    Seizures (HCC)    Pulmonary emboli (HCC)    GERD (gastroesophageal reflux disease)    AKI (acute kidney injury) (HCC)    Decreased urine output    Hypokalemia    History of esophageal stricture    Dysphagia    Respiratory distress    Presbyesophagus    Volume overload    Stage III CKD   Discharge Condition: Improved.  Diet recommendation: Low sodium, heart healthy.    History of Present Illness:   Karen Richardson is an 83 y.o. female with medical history significant forhypertension and seizure disorder who presents in transfer from [facility] for management of atrial fibrillation with RVR. Patient states that she has been experiencing generalized weakness, fatigue with minimal daily activity. She then progressed to have exertional dyspnea, which prompted evaluation in the emergency department. On presentation to [facility] on 11/27/2016, she was in atrial fibrillation with RVR. She was initially treated with IV Cardizem. She was also given Rocephin and azithromycin for possible pneumonia. D-dimer was elevated and CT chest revealed right lower and middle lobe pulmonary emboli with evidence for heart strain. Patient continued to have rapid rates intermittently in the hospital and was eventually transferred to [facility] for cardiology evaluation.   A Fib  treated with amiodarone, cardizem, metoprolol. PE treated with lovenox/coumadin and INR now therapeutic. Hospitalization has been complicated by encephalopathy, weakness, decreased oral intake, nausea, vomiting, decreased urine output.    Hospital Course by Problem:   Principal problems:  Atrial fibrillation with RVR, new onset, complicated by volume overload CHADSVASc 6, currently on Coumadin with a therapeutic INR. Heart rate controlled on Cardizem and metoprolol. Blood pressure stable. Patient was unable to tolerate amiodarone with increased nausea/vomiting so this was discontinued 12/08/16. The patient was diuresed and her weight is down 28 pounds.  Will d/c on lasix 40 mg daily.    Acute PE with right-heart strain  Pt was initially admitted to hospital on 12/29 with suspected PNA. D-dimer was elevated and CTA featured RLL and RML PEs with CT-evidence for right heart strain. Due to interactions with tegretol, patient was started on coumadin/lovenox to bridge. INR now sub-therapeutic. Will D/C on Lovenox bridge until INR therapeutic. Monitor INR closely.  Acute encephalopathy Resolved with discontinuation of sedating medications.  Active problems:  Hypokalemia  Secondary to diuretics. Continue routine potassium supplementation.  Poor PO intake Dietitian consulted. Continue supplements per recommendations. Appetite improved at discharge.  Stage III CKD Creatinine stable with diuresis, renal ultrasound WNL.    Hypertension  Continue Lasix, Toprol and diltiazem, controlled.  CAP Pt was admitted to [facility] on 12/29 with suspected CAP and received 5 days treatment with Rocephin and azithro prior to transfer here.  Seizure disorder  Continue Tegretol.  GERD/history of esophageal stricture/presbyesophagus/persistent nausea Continue Protonix. Patient reported ongoing nausea, dysphasia and regurgitation of food. Esophagram done 12/10/16 and showed a  mild smooth lower esophageal  stricture/evidence of GERD. Presbyesophagus noted. Nausea resolved. Intolerant of Reglan.      Medical Consultants:    Cardiology: Quintella Reichert, MD   Discharge Exam:   Vitals:   12/14/16 1228 12/14/16 1311  BP: 136/81   Pulse: 74 (!) 122  Resp: 19   Temp: 98.3 F (36.8 C)    Vitals:   12/13/16 2035 12/14/16 0451 12/14/16 1228 12/14/16 1311  BP: 101/72 124/81 136/81   Pulse: 86 71 74 (!) 122  Resp: 18 18 19    Temp: 97.7 F (36.5 C) 97.5 F (36.4 C) 98.3 F (36.8 C)   TempSrc: Oral Oral Oral   SpO2: 97% 94% 97% 95%  Weight:  79.2 kg (174 lb 8 oz)    Height:       General exam: NAD. Respiratory system: Clear to auscultation. Respiratory effort unlabored. Cardiovascular system: Heart sounds are irregular. Improved/mild JVD,  no rubs, gallops or clicks. No murmurs. Gastrointestinal system: Abdomen is non-distended, nontender. No organomegaly or masses felt. Normal bowel sounds heard. Central nervous system: Alert and oriented x 3. No focal neurological deficits. Extremities: No clubbing,  or cyanosis. Trace edema. Skin: No rashes, lesions or ulcers. Psychiatry: Judgement and insight appear mildly impaired. Mood & affect normal.    The results of significant diagnostics from this hospitalization (including imaging, microbiology, ancillary and laboratory) are listed below for reference.     Procedures and Diagnostic Studies:   Dg Chest Port 1 View  Result Date: 12/02/2016 CLINICAL DATA:  Respiratory distress EXAM: PORTABLE CHEST 1 VIEW COMPARISON:  None. FINDINGS: Non inclusion of the extreme lung bases. There is mild cardiomegaly. Diffuse mild interstitial prominence could relate to mild interstitial infiltrates or edema. Streaky atelectasis or infiltrate at the left base. No pneumothorax. IMPRESSION: 1. Cardiomegaly. 2. Mild interstitial opacities could reflect interstitial infiltrates or mild edema. Streaky atelectasis versus mild infiltrate at the left base.  Electronically Signed   By: Jasmine Pang M.D.   On: 12/02/2016 01:47     Labs:   Basic Metabolic Panel:  Recent Labs Lab 12/08/16 0832 12/09/16 0430 12/10/16 0523 12/11/16 0451 12/12/16 0527  NA 140 139 140 140 137  K 4.6 4.2 3.4* 3.6 3.6  CL 111 112* 106 100* 96*  CO2 20* 23 25 28 30   GLUCOSE 119* 120* 98 102* 92  BUN 21* 19 13 11 9   CREATININE 1.12* 1.02* 1.15* 1.06* 1.05*  CALCIUM 8.2* 8.1* 8.3* 8.6* 9.0   GFR Estimated Creatinine Clearance: 44.5 mL/min (by C-G formula based on SCr of 1.05 mg/dL (H)). Liver Function Tests: No results for input(s): AST, ALT, ALKPHOS, BILITOT, PROT, ALBUMIN in the last 168 hours. No results for input(s): LIPASE, AMYLASE in the last 168 hours. No results for input(s): AMMONIA in the last 168 hours. Coagulation profile  Recent Labs Lab 12/10/16 0523 12/11/16 0451 12/12/16 0527 12/13/16 0520 12/14/16 0241  INR 2.56 2.42 2.45 2.02 1.54    CBC:  Recent Labs Lab 12/10/16 0523 12/13/16 0520  WBC 4.5 6.1  HGB 11.5* 13.1  HCT 35.1* 39.5  MCV 93.9 95.0  PLT 185 201   Cardiac Enzymes: No results for input(s): CKTOTAL, CKMB, CKMBINDEX, TROPONINI in the last 168 hours.   Discharge Instructions:   Discharge Instructions    Call MD for:  extreme fatigue    Complete by:  As directed    Call MD for:  persistant dizziness or light-headedness    Complete by:  As directed  Diet - low sodium heart healthy    Complete by:  As directed    Increase activity slowly    Complete by:  As directed    Walk with assistance    Complete by:  As directed    Walker     Complete by:  As directed      Allergies as of 12/14/2016      Reactions   Codeine Nausea Only      Medication List    STOP taking these medications   amLODipine 5 MG tablet Commonly known as:  NORVASC   amoxicillin-clavulanate 500-125 MG tablet Commonly known as:  AUGMENTIN   benazepril 40 MG tablet Commonly known as:  LOTENSIN     TAKE these medications     albuterol 108 (90 Base) MCG/ACT inhaler Commonly known as:  PROVENTIL HFA;VENTOLIN HFA Inhale 1 puff into the lungs every 6 (six) hours as needed for wheezing or shortness of breath.   atorvastatin 10 MG tablet Commonly known as:  LIPITOR Take 10 mg by mouth daily.   carbamazepine 200 MG tablet Commonly known as:  TEGRETOL Take 1 tablet (200 mg total) by mouth 3 (three) times daily. What changed:  when to take this   diltiazem 360 MG 24 hr capsule Commonly known as:  CARDIZEM CD Take 1 capsule (360 mg total) by mouth daily.   enoxaparin 80 MG/0.8ML injection Commonly known as:  LOVENOX Inject 0.8 mLs (80 mg total) into the skin every 12 (twelve) hours.   feeding supplement (ENSURE ENLIVE) Liqd Take 237 mLs by mouth 2 (two) times daily between meals.   furosemide 40 MG tablet Commonly known as:  LASIX Take 1 tablet (40 mg total) by mouth daily.   metoprolol succinate 100 MG 24 hr tablet Commonly known as:  TOPROL-XL Take 1 tablet (100 mg total) by mouth 2 (two) times daily. Take with or immediately following a meal. What changed:  medication strength  how much to take  when to take this  additional instructions   pantoprazole 40 MG tablet Commonly known as:  PROTONIX Take 40 mg by mouth daily.   polyethylene glycol packet Commonly known as:  MIRALAX / GLYCOLAX Take 17 g by mouth daily.   potassium chloride 10 MEQ tablet Commonly known as:  K-DUR,KLOR-CON Take 1 tablet (10 mEq total) by mouth 2 (two) times daily.   warfarin 2.5 MG tablet Commonly known as:  COUMADIN Take 2 tablets (5 mg total) by mouth daily.      Follow-up Information    Ignatius Specking, MD. Schedule an appointment as soon as possible for a visit in 1 week(s).   Specialty:  Internal Medicine Why:  Hospital follow up. Contact information: [address] [place] [address] [phone]            Time coordinating discharge: 35 minutes.  Signed:  Earon Rivest  Pager  [phone] [facility] 12/14/2016, 2:34 PM

## 2016-12-14 NOTE — Progress Notes (Signed)
ANTICOAGULATION CONSULT NOTE - Follow Up Consult  Pharmacy Consult for Coumadin Indication: acute PE and afib  Allergies  Allergen Reactions  . Codeine Nausea Only    Patient Measurements: Height: 5\' 5"  (165.1 cm) Weight: 174 lb 8 oz (79.2 kg) IBW/kg (Calculated) : 57 Heparin Dosing Weight: 76 kg  Vital Signs: Temp: 97.5 F (36.4 C) (01/15 0451) Temp Source: Oral (01/15 0451) BP: 124/81 (01/15 0451) Pulse Rate: 71 (01/15 0451)  Assessment: 82yof transferred from [facility]. Eliquis 5mg  po BID started at [facility] for afib 12/30. CT showed acute PE so dose increased to 10mg  BID on 12/31 - last dose 1/2 a.m. Was then transitioned to IV heparin. Since pt on carbamazepine (can decrease apixaban concentration - not recommended to use together), pharmacy consulted  to dose coumadin (lovenox stopped 1/9 with elevated INR).  -INR down to 1.54 (possible due to recent holds, dose missed on 1/13 and ? recent discontinuation of amiodarone)  Goal of Therapy:  INR 2-3 Monitor platelets by anticoagulation protocol: Yes   Plan:  1) Coumadin 5mg  x 1 2) Daily INR  Harland German, Pharm D 12/14/2016 9:08 AM

## 2016-12-14 NOTE — Progress Notes (Signed)
OT Cancellation Note  Patient Details Name: Karen Richardson MRN: [id number] DOB: December 21, 1933   Cancelled Treatment:    Reason Eval/Treat Not Completed: Patient declined, no reason specified. Pt refusing to work with OT at this time, reports she is just ready to go home and does not want to move around any more today. Will follow up as time allows.  Gaye Alken M.S., OTR/L Pager: [phone]  12/14/2016, 3:12 PM

## 2016-12-14 NOTE — Care Management Note (Signed)
Case Management Note Karen Pierini RN, BSN Unit 2W-Case Manager [phone]  Patient Details  Name: Karen Richardson MRN: [id number] Date of Birth: Dec 14, 1933  Subjective/Objective:  Pt admitted as tx from [facility] with afib/PE                  Action/Plan: PTA pt lived at home- anticipate return home- per PT/OT evals- recommendations for [facility]- awaiting MD orders. CM to follow for d/c needs  Expected Discharge Date:  12/14/16               Expected Discharge Plan:  Home w Home Health Services  In-House Referral:  Clinical Social Work  Discharge planning Services  CM Consult  Post Acute Care Choice:  Home Health Choice offered to:  Patient  DME Arranged:  N/A DME Agency:  NA  HH Arranged:  RN, PT, OT HH Agency:  [facility]  Status of Service:  Completed, signed off  If discussed at Long Length of Stay Meetings, dates discussed:    Discharge Disposition: Home with Home health   Additional Comments:  12/14/16- 1400- Karen Pierini RN, CM- update- PT has re-evaluated pt and now recommendation for [facility]- state pt is safe to return home and does not need STSNF- per CSW still working on insurance auth- call made to pt's daughterSedalia Richardson- [phone]) who wanted pt to go to SNF is confused by the turn of events over the coarse of the day- is ok with mom coming home but concerned about the Lovenox shots needed- spoke with pt at bedside who states she wants to return home - states that she feels like she can give her own Lovenox shots or has people who can assist- choice offered for [facility] agency - pt states she does not have a preference and is fine using one that is affiliated with hospital- will use [facility]. Call made to MD to discuss d/c plan- MD will order HHRN/PT/OT- per MD pt will need INR checks daily-with results called to Cardiology. Referral called to Clydie Braun with [facility] for [facility] services- RN/PT/OT- per pt she has a RW at home if needed- also spoke with pt's insurance- and per  conversation pt has not been approved for SNF as of today- but has not been denied either- case would need to go to a P2P review with [employer] geologist. ( most likely be denied)- if needed to go to SNF from home family could call to have case reviewed to see if it would be approved- have shared this info with pt's daughter- Karen Richardson.   12/14/16- 1015- Karen Williamson RN, CM- pt stable for d/c today to SNF- plan for [facility]- CSW following for placement needs.   12/11/16- 1400- Karen Pion RN, CM- pt remains vol. Overloaded- per MD not ready for SNF- CSW continues to follow for placement.   12/07/16- 1000- Karen Richardson- family requesting STSNF for pt- per CSW pt is agreeable to SNF- PT re-eval today recommendations now updated to SNF- CSW will follow for STSNF placement when medically stable    Karen Alpers Lenn Sink, RN 12/14/2016, 3:23 PM

## 2016-12-14 NOTE — Progress Notes (Signed)
Pt discharge education and instructions completed with pt and daughter at bedside. Both voices understanding and denies any questions. Pt IV removed; pt to pick up electronically sent prescription from preferred pharmacy on file. Pt transported off unit via wheelchair with belongings to side along with daughter. Lovenox education instructions completed with pt's daughter as well. Pt discharge home with daughter to transport her home. Karen Bucy RN

## 2016-12-14 NOTE — Progress Notes (Signed)
Lovenox education completed with pt at bedside; pt self administered afternoon ordered dose. Pt voices understanding voices confidence of doing it at home. Print out education resource also handed to pt. Dionne Bucy RN

## 2016-12-18 ENCOUNTER — Telehealth: Payer: Self-pay | Admitting: *Deleted

## 2016-12-18 ENCOUNTER — Ambulatory Visit (INDEPENDENT_AMBULATORY_CARE_PROVIDER_SITE_OTHER): Payer: Medicare Other | Admitting: Pharmacist

## 2016-12-18 DIAGNOSIS — Z7901 Long term (current) use of anticoagulants: Secondary | ICD-10-CM

## 2016-12-18 DIAGNOSIS — Z5181 Encounter for therapeutic drug level monitoring: Secondary | ICD-10-CM

## 2016-12-18 DIAGNOSIS — I2699 Other pulmonary embolism without acute cor pulmonale: Secondary | ICD-10-CM

## 2016-12-18 DIAGNOSIS — I4891 Unspecified atrial fibrillation: Secondary | ICD-10-CM

## 2016-12-18 HISTORY — DX: Long term (current) use of anticoagulants: Z79.01

## 2016-12-18 HISTORY — DX: Encounter for therapeutic drug level monitoring: Z51.81

## 2016-12-18 LAB — POCT INR: INR: 2.4

## 2016-12-18 NOTE — Telephone Encounter (Signed)
See anticoag note from 12/18/16 for dosing and details - INR to be called to [facility] in future.

## 2016-12-18 NOTE — Telephone Encounter (Signed)
[facility] was told to call INR to our office.  Today she was 2.4.  Coumadin 5mg  daily.  Stopped her Lovenox yesterday.  On med for PE & AF.  Patient will be seeing Dr. Purvis Sheffield for the first time on Monday, 12/21/2016.

## 2016-12-21 ENCOUNTER — Ambulatory Visit (INDEPENDENT_AMBULATORY_CARE_PROVIDER_SITE_OTHER): Payer: Medicare Other | Admitting: Cardiovascular Disease

## 2016-12-21 ENCOUNTER — Encounter: Payer: Self-pay | Admitting: Cardiovascular Disease

## 2016-12-21 VITALS — BP 130/83 | HR 123 | Ht 65.0 in | Wt 167.8 lb

## 2016-12-21 DIAGNOSIS — I2699 Other pulmonary embolism without acute cor pulmonale: Secondary | ICD-10-CM | POA: Diagnosis not present

## 2016-12-21 DIAGNOSIS — Z9289 Personal history of other medical treatment: Secondary | ICD-10-CM

## 2016-12-21 DIAGNOSIS — I5031 Acute diastolic (congestive) heart failure: Secondary | ICD-10-CM | POA: Diagnosis not present

## 2016-12-21 DIAGNOSIS — I10 Essential (primary) hypertension: Secondary | ICD-10-CM | POA: Diagnosis not present

## 2016-12-21 DIAGNOSIS — I4891 Unspecified atrial fibrillation: Secondary | ICD-10-CM | POA: Diagnosis not present

## 2016-12-21 MED ORDER — METOPROLOL SUCCINATE ER 50 MG PO TB24: ORAL_TABLET | ORAL | Status: DC

## 2016-12-21 MED ORDER — METOPROLOL SUCCINATE ER 100 MG PO TB24: 100.0000 mg | ORAL_TABLET | Freq: Two times a day (BID) | ORAL | Status: DC

## 2016-12-21 NOTE — Addendum Note (Signed)
Addended by: Lesle Chris on: 12/21/2016 02:06 PM   Modules accepted: Orders

## 2016-12-21 NOTE — Patient Instructions (Signed)
Medication Instructions:   Increase Toprol XL to 125mg  twice a day.  Patient will use the 100mg  tablet & 1/2 tab of the 50mg  tablet that she already has at home for now.  Will let us know when need refill.   Continue all other medications.    Labwork: none  Testing/Procedures: none  Follow-Up: 3 weeks   Any Other Special Instructions Will Be Listed Below (If Applicable). Nurse visit in 1 week for EKG.    If you need a refill on your cardiac medications before your next appointment, please call your pharmacy.

## 2016-12-21 NOTE — Progress Notes (Signed)
SUBJECTIVE: The patient presents to follow-up after being hospitalized for rapid atrial fibrillation and bilateral pulmonary emboli. This is my first time meeting her. I reviewed all relevant documentation, studies, and labs pertaining to this hospitalization.   She had been on amiodarone but it was discontinued secondary to nausea. She is on metoprolol and long-acting diltiazem. She is on warfarin. She also developed acute diastolic heart failure.  She feels "nervous and jittery ". She has palpitations primarily when lying down at night. She feels a little off balance. She is here with her daughter.   Review of Systems: As per "subjective", otherwise negative.  Allergies  Allergen Reactions  . Codeine Nausea Only    Current Outpatient Prescriptions  Medication Sig Dispense Refill  . atorvastatin (LIPITOR) 10 MG tablet Take 10 mg by mouth daily.    . carbamazepine (TEGRETOL) 200 MG tablet Take 1 tablet (200 mg total) by mouth 3 (three) times daily. 90 tablet 0  . diltiazem (CARDIZEM CD) 360 MG 24 hr capsule Take 1 capsule (360 mg total) by mouth daily. 30 capsule 2  . enoxaparin (LOVENOX) 80 MG/0.8ML injection Inject 0.8 mLs (80 mg total) into the skin every 12 (twelve) hours. 6 Syringe 0  . furosemide (LASIX) 40 MG tablet Take 1 tablet (40 mg total) by mouth daily. 30 tablet 2  . metoprolol succinate (TOPROL-XL) 100 MG 24 hr tablet Take 1 tablet (100 mg total) by mouth 2 (two) times daily. Take with or immediately following a meal. 60 tablet 2  . pantoprazole (PROTONIX) 40 MG tablet Take 40 mg by mouth daily.   4  . polyethylene glycol (MIRALAX / GLYCOLAX) packet Take 17 g by mouth daily. 14 each 0  . potassium chloride (K-DUR,KLOR-CON) 10 MEQ tablet Take 1 tablet (10 mEq total) by mouth 2 (two) times daily. 30 tablet 2  . warfarin (COUMADIN) 2.5 MG tablet Take 2 tablets (5 mg total) by mouth daily at 6 PM. 60 tablet 0   No current facility-administered medications for this  visit.     Past Medical History:  Diagnosis Date  . Atrial fibrillation with RVR (HCC) 12/01/2016  . CKD (chronic kidney disease), stage III 12/12/2016  . Dysphagia   . GERD (gastroesophageal reflux disease) 12/01/2016  . History of esophageal stricture 12/10/2016  . Hypertension    x 4 months  . Presbyesophagus 12/11/2016  . Pulmonary emboli (HCC) 12/01/2016  . Seizures (HCC)     Past Surgical History:  Procedure Laterality Date  . BREAST ENHANCEMENT SURGERY     40 yrs ago  . PARTIAL HYSTERECTOMY    . 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 Marital status: Married    Spouse name: N/A  . Number of children: N/A  . Years of education: N/A   Occupational History  . Not on file.   Social History Main Topics  . Smoking status: Never Smoker  . Smokeless tobacco: Never Used  . Alcohol use No  . Drug use: No  . Sexual activity: Not on file   Other Topics Concern  . Not on file   Social History Narrative  . No narrative on file     Vitals:   12/21/16 1319  BP: 130/83  Pulse: (!) 123  Weight: 167 lb 12.8 oz (76.1 kg)  Height: 5\' 5"  (1.651 m)    PHYSICAL EXAM General: NAD HEENT: Normal. Neck: No  JVD, no thyromegaly. Lungs: Clear to auscultation bilaterally with normal respiratory effort. CV: Nondisplaced PMI.  Tachycardic, irregular rhythm, normal S1/S2, no S3, no murmur. No pretibial or periankle edema.  Abdomen: Soft, nontender, no distention.  Neurologic: Alert and oriented.  Psych: Normal affect. Skin: Normal. Musculoskeletal: No gross deformities.    ECG: Most recent ECG reviewed.      ASSESSMENT AND PLAN:  1. Rapid atrial fibrillation: Currently taking long-acting diltiazem 360 mg daily and Toprol-XL 100 mg twice daily for rate control. Will increase to 125 mg bid and have her return for an ECG in one week. She is anticoagulated with warfarin. INR 2.41/19/18. Will obtain echocardiogram performed at [facility].  2. Bilateral pulmonary emboli: Anticoagulated with warfarin. INR 2.41/19/18.  3. Acute diastolic heart failure: Euvolemic on Lasix 40 mg daily.  4. Hypertension: Controlled.   Dispo: ECG in one week. FU with me in 3 weeks.   Time spent: 40 minutes, of which greater than 50% was spent reviewing symptoms, relevant blood tests and studies, and discussing management plan with the patient.    Prentice Docker, M.D., F.A.C.C.

## 2016-12-22 ENCOUNTER — Ambulatory Visit (INDEPENDENT_AMBULATORY_CARE_PROVIDER_SITE_OTHER): Payer: Medicare Other | Admitting: *Deleted

## 2016-12-22 ENCOUNTER — Telehealth: Payer: Self-pay | Admitting: *Deleted

## 2016-12-22 DIAGNOSIS — I4891 Unspecified atrial fibrillation: Secondary | ICD-10-CM

## 2016-12-22 DIAGNOSIS — Z5181 Encounter for therapeutic drug level monitoring: Secondary | ICD-10-CM

## 2016-12-22 DIAGNOSIS — Z7901 Long term (current) use of anticoagulants: Secondary | ICD-10-CM

## 2016-12-22 LAB — POCT INR: INR: 2

## 2016-12-22 NOTE — Telephone Encounter (Signed)
Done.  See coumadin note. 

## 2016-12-22 NOTE — Telephone Encounter (Signed)
[place] with [facility] called.  Patient Karen Richardson INR 2.0 please call [phone]

## 2016-12-28 ENCOUNTER — Ambulatory Visit (INDEPENDENT_AMBULATORY_CARE_PROVIDER_SITE_OTHER): Payer: Medicare Other | Admitting: *Deleted

## 2016-12-28 DIAGNOSIS — I4891 Unspecified atrial fibrillation: Secondary | ICD-10-CM

## 2016-12-28 DIAGNOSIS — Z5181 Encounter for therapeutic drug level monitoring: Secondary | ICD-10-CM

## 2016-12-28 DIAGNOSIS — Z7901 Long term (current) use of anticoagulants: Secondary | ICD-10-CM

## 2016-12-28 LAB — POCT INR: INR: 1.8

## 2016-12-29 ENCOUNTER — Ambulatory Visit (INDEPENDENT_AMBULATORY_CARE_PROVIDER_SITE_OTHER): Payer: Medicare Other | Admitting: *Deleted

## 2016-12-29 DIAGNOSIS — I5031 Acute diastolic (congestive) heart failure: Secondary | ICD-10-CM

## 2016-12-29 DIAGNOSIS — I4891 Unspecified atrial fibrillation: Secondary | ICD-10-CM | POA: Diagnosis not present

## 2016-12-29 NOTE — Progress Notes (Addendum)
PT here for repeat EKG after increase of Toprol XL 125 mg bid - pt says she has been compliant in dose increase - c/o dry mouth since in hospital - denies any other symptoms at this time does says she "get winded quick"  - EKG done - routed to Dr. Purvis Sheffield - pt wants to know how long she needs to be on lasix says she losing weight (161lbs today) and feels "bony" and says she drinks 24 oz of fluid a day and taste have changed.

## 2016-12-29 NOTE — Progress Notes (Signed)
HR still suboptimally controlled. Increase Toprol-XL to 150 mg bid. Can reduce Lasix to 20 mg daily. Check BMET. Repeat ECG in one week.

## 2016-12-29 NOTE — Addendum Note (Signed)
Addended by: Burman Nieves T on: 12/29/2016 05:09 PM   Modules accepted: Orders

## 2016-12-29 NOTE — Progress Notes (Signed)
Pt daughter made aware - will come tomorrow for lab orders - medication list updated - nurse visit scheduled

## 2016-12-31 ENCOUNTER — Telehealth: Payer: Self-pay | Admitting: Cardiovascular Disease

## 2016-12-31 NOTE — Telephone Encounter (Signed)
Gained a 1 1/2 pounds in one day.  Feels tired & weak.  No other c/o SOB, chest pain, or dizziness.  Did have 2 Sun Drops though.  HH is trying to get her to drink more water.  3 days since increasing medication.    Irving Burton going again tomorrow.  Advised to continue to monitor BP readings for now to see if there is a trend or just one isolated reading.  Also, informed Irving Burton that she has nurse visit scheduled on 2/6 for EKG here in office.  Can send readings with her to that visit for review.  She verbalized understanding.

## 2016-12-31 NOTE — Telephone Encounter (Signed)
Calling to report BP today of 90/60. Had medications change at office visit this week. Please call w/ instructions. / tg

## 2016-12-31 NOTE — Telephone Encounter (Signed)
Karen Richardson,will forward to that office

## 2017-01-04 ENCOUNTER — Ambulatory Visit (INDEPENDENT_AMBULATORY_CARE_PROVIDER_SITE_OTHER): Payer: Medicare Other | Admitting: *Deleted

## 2017-01-04 DIAGNOSIS — Z7901 Long term (current) use of anticoagulants: Secondary | ICD-10-CM

## 2017-01-04 DIAGNOSIS — I4891 Unspecified atrial fibrillation: Secondary | ICD-10-CM

## 2017-01-04 DIAGNOSIS — Z5181 Encounter for therapeutic drug level monitoring: Secondary | ICD-10-CM

## 2017-01-04 LAB — POCT INR: INR: 2.3

## 2017-01-05 ENCOUNTER — Ambulatory Visit (INDEPENDENT_AMBULATORY_CARE_PROVIDER_SITE_OTHER): Payer: Medicare Other | Admitting: *Deleted

## 2017-01-05 ENCOUNTER — Telehealth: Payer: Self-pay | Admitting: *Deleted

## 2017-01-05 DIAGNOSIS — I4891 Unspecified atrial fibrillation: Secondary | ICD-10-CM

## 2017-01-05 MED ORDER — DILTIAZEM HCL ER COATED BEADS 240 MG PO CP24: 480.0000 mg | ORAL_CAPSULE | Freq: Every day | ORAL | 3 refills | Status: DC

## 2017-01-05 NOTE — Progress Notes (Signed)
Pt aware - voiced understanding of med increase - Medication sent to pharmacy and nurse visit scheduled 2/21

## 2017-01-05 NOTE — Progress Notes (Signed)
Still suboptimally controlled. Increase Cardizem CD to 480 mg daily. Repeat ECG in 2 weeks and check BP with nurse.

## 2017-01-05 NOTE — Addendum Note (Signed)
Addended by: Burman Nieves T on: 01/05/2017 01:34 PM   Modules accepted: Orders

## 2017-01-05 NOTE — Telephone Encounter (Signed)
Received call from husband - c/o low BP readings & wife not feeling well.  BP running low, no chest pain, SOB is about the same, dizzy & just not feeling good.  States BP has been like this since getting out of hospital.  Drinking plenty of fluids.    BP - 84/62 HR - 40's  BP now was 89/42  56   Was in office this morning for nurse visit at which time her Diltiazem was increased to 480mg  daily - has not taken the extra amount this evening.    Discussed with Dr. Purvis Sheffield - per his instruction: Hold Toprol XL & Diltiazem this evening.  Lye down & rest.  In the morning, before doing am med, check BP.  If HR is greater than 90, can do the Diltiazem if BP is back up.  If systolic BP (top number) is less than 100 - hold both Toprol & Diltiazem & call office for any further instructions.    Husband verbalized understanding.

## 2017-01-05 NOTE — Telephone Encounter (Signed)
Daughter Dot Been) called the office shortly after.  Reviewed all of the information below with her.  She is seen new pmd (Dr. Delton See) this Thursday, 01/07/2017.  Suggested that she go with her to the OV as she had multiple questions that needed to be addressed in the office setting, not over the phone.  Stated that she was in [place] & the other sister had just started a new job.  Also, reminded her that she has OV here on 01/11/17 for 3 wk f/u.  She stated that she was concerned about how she was feeling & these medications.  Explained to her that the increase in medications is to try to get the heart rate under better control.  Informed her as I did Mr. Bozzi that if her symptoms worsened in the meantime, not to hesitate to take her to ED for evaluation.

## 2017-01-05 NOTE — Progress Notes (Signed)
Pt here for repeat EKG after increasing Toprol XL 150 mg bid - pt says she has been compliant - BMP completed and in EPIC - pt says she has "good days and bad days" pt denies any new symptoms - but has been sleeping better. EKG done - routed to Dr. Purvis Sheffield

## 2017-01-06 NOTE — Telephone Encounter (Signed)
Pt voiced understanding of plan. Will check BP prior to taking 240 mg of diltiazem - will hold metoprolol - pt will come to [facility] for EKG nurse visit (coudn't schedule in [place] no provider in office tomorrow) message sent to [employer] and clinic staff

## 2017-01-06 NOTE — Telephone Encounter (Signed)
Pt called back to say that she took diltiazem 360 mg this morning (by mistake) and BP this after noon was 100/56 HR 58 - will forward to DOD with Dr. Purvis Sheffield out of office - should pt continue to hold metoprolol and diltiazem

## 2017-01-06 NOTE — Telephone Encounter (Signed)
BP today is 122/75 HR 89 this morning - does pt need to take increase of dilitazem 480 mg? Pt says she hasn't taken any BP meds this AM - denies any symptoms this morning

## 2017-01-06 NOTE — Telephone Encounter (Signed)
This is a patient of Dr. Purvis Sheffield. I am not familiar with her case. I spoke with nursing today and also reviewed the chart to try and get a better understanding of things. It looks like she has had escalation in rate control medications for management of rapid atrial fibrillation, however just recently has had low blood pressures and heart rate. Today she took Cardizem CD 360 mg in the morning and subsequently blood pressure was 100/56 with heart rate 58, had been 122/75 with heart rate 89 in the morning prior to medications. Although this may well be a moving target, would suggest that she continue to hold metoprolol, check her blood pressure tomorrow morning, and plan to take Cardizem CD 240 mg if her systolic blood pressure and heart rate are in the range that they were this morning prior to medication. It may also be useful to have her come in for a nurse visit and ECG tomorrow to see if she has converted to sinus rhythm which might be an explanation for why her heart rate is much slower on less medication.

## 2017-01-06 NOTE — Telephone Encounter (Signed)
Also says she hasn't taken metoprolol since yesterday

## 2017-01-07 ENCOUNTER — Encounter: Payer: Self-pay | Admitting: Family Medicine

## 2017-01-07 ENCOUNTER — Ambulatory Visit (INDEPENDENT_AMBULATORY_CARE_PROVIDER_SITE_OTHER): Payer: Medicare Other

## 2017-01-07 ENCOUNTER — Ambulatory Visit (INDEPENDENT_AMBULATORY_CARE_PROVIDER_SITE_OTHER): Payer: Medicare Other | Admitting: Family Medicine

## 2017-01-07 VITALS — BP 116/75 | HR 87

## 2017-01-07 VITALS — BP 116/68 | HR 108 | Temp 97.7°F | Resp 18 | Ht 65.0 in | Wt 167.1 lb

## 2017-01-07 DIAGNOSIS — K228 Other specified diseases of esophagus: Secondary | ICD-10-CM

## 2017-01-07 DIAGNOSIS — I773 Arterial fibromuscular dysplasia: Secondary | ICD-10-CM

## 2017-01-07 DIAGNOSIS — Z8719 Personal history of other diseases of the digestive system: Secondary | ICD-10-CM | POA: Diagnosis not present

## 2017-01-07 DIAGNOSIS — G40909 Epilepsy, unspecified, not intractable, without status epilepticus: Secondary | ICD-10-CM | POA: Diagnosis not present

## 2017-01-07 DIAGNOSIS — Z7689 Persons encountering health services in other specified circumstances: Secondary | ICD-10-CM

## 2017-01-07 DIAGNOSIS — I2699 Other pulmonary embolism without acute cor pulmonale: Secondary | ICD-10-CM

## 2017-01-07 DIAGNOSIS — K2289 Other specified disease of esophagus: Secondary | ICD-10-CM

## 2017-01-07 DIAGNOSIS — R42 Dizziness and giddiness: Secondary | ICD-10-CM

## 2017-01-07 DIAGNOSIS — R1319 Other dysphagia: Secondary | ICD-10-CM

## 2017-01-07 DIAGNOSIS — R1310 Dysphagia, unspecified: Secondary | ICD-10-CM | POA: Diagnosis not present

## 2017-01-07 DIAGNOSIS — I4891 Unspecified atrial fibrillation: Secondary | ICD-10-CM

## 2017-01-07 NOTE — Progress Notes (Signed)
Chief Complaint  Patient presents with  . consult   Patient is seen today as a new to establish. She wishes to have her primary care through this office. She states that she is up-to-date with primary care testing, cancer screenings, and immunizations. Old records will be requested. She is here today with her husband Greggory Stallion. She requests that her 2 daughters be allowed permission to discuss her care.  She was hospitalized in January. She was found to be in atrial fibrillation with rapid ventricular response. She was also found to have bilateral pulmonary emboli. She is now anticoagulated on Coumadin and managed through Coumadin clinic. Closely followed by cardiology. She is still having some tachycardia from her atrial fibrillation, had some hypotension, and bradycardia on occasion. There've been some difficulties in managing her medications and coming up with the right combination and dosage to control her symptoms. An EKG done today demonstrates that she is still in atrial fibrillation.  Patient states she has long-standing dizziness. This predates her atrial fibrillation. Sometimes it feels like a spinning or vertigo sensation . Sometimes she states it feels more like lightheadedness from changing position or blood pressure fluctuations. She has requested a referral to neurology which has not been accomplished. She's had a seizure disorder for approximately 50 years and is on Tegretol. She is stable on her current dose. She has not had a seizure for many years. She drives.  She's had some difficulty swallowing recently. She was evaluated in the hospital with a barium swallow. She has a stricture. She needs referral to gastroenterology. She is compliant with taking Protonix 40 mg a day. She does not feel heartburn or abdominal pain.  She is frustrated that she continues to feel weak since her hospitalization. She is using a walker to get around. Previously she was quite active. We discussed that  at her age it is not unusual to have a slow recovery from serious illness and hospitalization.    Patient Active Problem List   Diagnosis Date Noted  . Dizziness 01/07/2017  . Monitoring for long-term anticoagulant use 12/18/2016  . CKD (chronic kidney disease), stage III 12/12/2016  . Seizure disorder (HCC) 12/12/2016  . Presbyesophagus 12/11/2016  . History of esophageal stricture 12/10/2016  . Dysphagia   . AKI (acute kidney injury) (HCC)   . Atrial fibrillation with RVR (HCC) 12/01/2016  . Pulmonary emboli (HCC) 12/01/2016  . GERD (gastroesophageal reflux disease) 12/01/2016  . Arthralgia of both knees 01/11/2016  . Hypertension 09/22/2011    Outpatient Encounter Prescriptions as of 01/07/2017  Medication Sig  . atorvastatin (LIPITOR) 10 MG tablet Take 10 mg by mouth daily.  . carbamazepine (TEGRETOL) 200 MG tablet Take 1 tablet (200 mg total) by mouth 3 (three) times daily.  Marland Kitchen diltiazem (CARDIZEM CD) 240 MG 24 hr capsule Take 2 capsules (480 mg total) by mouth daily.  Marland Kitchen enoxaparin (LOVENOX) 80 MG/0.8ML injection Inject 0.8 mLs (80 mg total) into the skin every 12 (twelve) hours.  . furosemide (LASIX) 20 MG tablet Take 20 mg by mouth daily.  . metoprolol succinate (TOPROL-XL) 50 MG 24 hr tablet Take 50 mg by mouth 2 (two) times daily. Take with or immediately following a meal.  . pantoprazole (PROTONIX) 40 MG tablet Take 40 mg by mouth daily.   . polyethylene glycol (MIRALAX / GLYCOLAX) packet Take 17 g by mouth daily.  . potassium chloride (K-DUR,KLOR-CON) 10 MEQ tablet Take 1 tablet (10 mEq total) by mouth 2 (two) times daily.  Marland Kitchen  warfarin (COUMADIN) 2.5 MG tablet Take 2 tablets (5 mg total) by mouth daily at 6 PM.   No facility-administered encounter medications on file as of 01/07/2017.     Past Medical History:  Diagnosis Date  . Anxiety   . Atrial fibrillation with RVR (HCC) 12/01/2016  . CKD (chronic kidney disease), stage III 12/12/2016  . Depression   . Dysphagia   .  GERD (gastroesophageal reflux disease) 12/01/2016  . Heart murmur   . History of esophageal stricture 12/10/2016  . Hyperlipidemia   . Hypertension    x 4 months  . Presbyesophagus 12/11/2016  . Pulmonary emboli (HCC) 12/01/2016  . Seizures (HCC)   . Stroke [facility])     Past Surgical History:  Procedure Laterality Date  . ABDOMINAL HYSTERECTOMY    . BREAST ENHANCEMENT SURGERY     40 yrs ago  . PARTIAL HYSTERECTOMY    . 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 Marital status: Married    Spouse name: george  . Number of children: 4  . Years of education: 12   Occupational History  . retired     Neurosurgeon   Social History Main Topics  . Smoking status: Never Smoker  . Smokeless tobacco: Never Used  . Alcohol use No  . Drug use: No  . Sexual activity: Not Currently   Other Topics Concern  . Not on file   Social History Narrative   Lives with Greggory Stallion   many pets    Family History  Problem Relation Age of Onset  . Pulmonary embolism Mother   . Cancer Mother     mass in colon  . Stroke Father   . Alcohol abuse Father     Review of Systems  Constitutional: Positive for malaise/fatigue. Negative for chills, fever and weight loss.  HENT: Negative for congestion and hearing loss.   Eyes: Negative for blurred vision and pain.       Describes visual defect from old stroke  Respiratory: Positive for shortness of breath. Negative for cough.   Cardiovascular: Positive for palpitations. Negative for chest pain and leg swelling.  Gastrointestinal: Negative for abdominal pain, constipation, diarrhea and heartburn.  Genitourinary: Negative for dysuria and frequency.  Musculoskeletal: Positive for falls. Negative for joint pain and myalgias.  Neurological: Positive for dizziness and weakness. Negative for seizures and headaches.  Psychiatric/Behavioral: Positive for depression. The patient is not nervous/anxious and does not have insomnia.         Patient describes that she is frustrated and depressed about her current health situation    BP 116/68 (BP Location: Right Arm, Patient Position: Sitting, Cuff Size: Normal)   Pulse (!) 108   Temp 97.7 F (36.5 C) (Temporal)   Resp 18   Ht 5\' 5"  (1.651 m)   Wt 167 lb 1.9 oz (75.8 kg)   LMP  (Approximate)   SpO2 100%   BMI 27.81 kg/m   Physical Exam  Constitutional: She is oriented to person, place, and time. She appears well-developed and well-nourished. No distress.  Discouraged. Moderately overweight  HENT:  Head: Normocephalic and atraumatic.  Mouth/Throat: Oropharynx is clear and moist.  Eyes: Conjunctivae are normal. Pupils are equal, round, and reactive to light.  Neck: Normal range of motion. Neck supple.  Cardiovascular: Normal heart sounds.  An irregularly irregular rhythm present. Tachycardia present.   Pulmonary/Chest: Effort normal and breath sounds normal.  Musculoskeletal: Normal  range of motion. She exhibits no edema.  Lymphadenopathy:    She has no cervical adenopathy.  Neurological: She is alert and oriented to person, place, and time.  Psychiatric: She has a normal mood and affect. Her behavior is normal.   ASSESSMENT/PLAN:  1. Esophageal dysphagia  - Ambulatory referral to Gastroenterology  2. Presbyesophagus  - Ambulatory referral to Gastroenterology  3. Seizure disorder (HCC) Stable by history  4. History of esophageal stricture  - Ambulatory referral to Gastroenterology  5. Dizziness Long-standing  6. Encounter to establish care with new doctor Primary care records are requested  7. Atrial fibrillation with RVR (HCC) Under care of cardiology  8. Other pulmonary embolism without acute cor pulmonale, unspecified chronicity (HCC)  Recent hospital records are reviewed.  Patient Instructions  Need records from PCP  I will refer to Dr Darrick Penna in gastroenterology for your esophagus  Need to continue the home health  assessments  No change in medicines  Drink plenty of water  We will talk about referral for dizziness once heart and BP regulate better  See me in 3-4 weeks         Eustace Moore, MD

## 2017-01-07 NOTE — Patient Instructions (Addendum)
Need records from PCP  I will refer to Dr Darrick Penna in gastroenterology for your esophagus  Need to continue the home health assessments  No change in medicines  Drink plenty of water  We will talk about referral for dizziness once heart and BP regulate better  See me in 3-4 weeks

## 2017-01-07 NOTE — Patient Instructions (Signed)
Medication Instructions:  Start Toprol xl 50 mg - two times daily   Labwork: none  Testing/Procedures: none  Follow-Up: Your physician recommends that you schedule a follow-up appointment in: 1 week    Any Other Special Instructions Will Be Listed Below (If Applicable).     If you need a refill on your cardiac medications before your next appointment, please call your pharmacy.

## 2017-01-07 NOTE — Telephone Encounter (Signed)
Call placed to patient as daughter lives in [place].  Patient has nurse visit in [place] this afternoon in our [place] office.  Will make staff aware of these readings.

## 2017-01-07 NOTE — Telephone Encounter (Signed)
Daughter ([name]) called with current BP readings . 139/96 HR 96 and 119/81 HR 91 states that these were taken around 830am today.  Please call [phone]

## 2017-01-08 ENCOUNTER — Telehealth: Payer: Self-pay | Admitting: Cardiovascular Disease

## 2017-01-08 NOTE — Telephone Encounter (Signed)
[name] (daughter) notified that she does not need to keep calling readings to the office as she has OV on Monday with Dr. Purvis Sheffield.  They can continue to log readings & bring to OV though.

## 2017-01-08 NOTE — Telephone Encounter (Signed)
BP reading 110/81 pulse 93  Would like to know if she still needs to call in BP readings

## 2017-01-11 ENCOUNTER — Telehealth: Payer: Self-pay | Admitting: Cardiovascular Disease

## 2017-01-11 ENCOUNTER — Ambulatory Visit (INDEPENDENT_AMBULATORY_CARE_PROVIDER_SITE_OTHER): Payer: Medicare Other | Admitting: Cardiovascular Disease

## 2017-01-11 ENCOUNTER — Other Ambulatory Visit: Payer: Self-pay

## 2017-01-11 ENCOUNTER — Encounter: Payer: Self-pay | Admitting: Cardiovascular Disease

## 2017-01-11 VITALS — BP 118/60 | HR 108 | Ht 65.0 in | Wt 167.2 lb

## 2017-01-11 DIAGNOSIS — I5032 Chronic diastolic (congestive) heart failure: Secondary | ICD-10-CM

## 2017-01-11 DIAGNOSIS — I2699 Other pulmonary embolism without acute cor pulmonale: Secondary | ICD-10-CM | POA: Diagnosis not present

## 2017-01-11 DIAGNOSIS — I10 Essential (primary) hypertension: Secondary | ICD-10-CM

## 2017-01-11 DIAGNOSIS — I4891 Unspecified atrial fibrillation: Secondary | ICD-10-CM

## 2017-01-11 DIAGNOSIS — Z5181 Encounter for therapeutic drug level monitoring: Secondary | ICD-10-CM

## 2017-01-11 DIAGNOSIS — Z7901 Long term (current) use of anticoagulants: Secondary | ICD-10-CM

## 2017-01-11 DIAGNOSIS — Z9889 Other specified postprocedural states: Secondary | ICD-10-CM

## 2017-01-11 DIAGNOSIS — Z8719 Personal history of other diseases of the digestive system: Secondary | ICD-10-CM

## 2017-01-11 MED ORDER — METOPROLOL SUCCINATE ER 50 MG PO TB24: ORAL_TABLET | ORAL | 6 refills | Status: DC

## 2017-01-11 MED ORDER — WARFARIN SODIUM 2.5 MG PO TABS: 5.0000 mg | ORAL_TABLET | Freq: Every day | ORAL | 5 refills | Status: DC

## 2017-01-11 NOTE — Telephone Encounter (Signed)
Seen 2 8 18

## 2017-01-11 NOTE — Patient Instructions (Signed)
Medication Instructions:   Increase Toprol XL to 75mg  twice a day.  Continue all other medications.    Labwork: none  Testing/Procedures: none  Follow-Up: 1 month   Any Other Special Instructions Will Be Listed Below (If Applicable). Nurse visit for EKG - due in 2 weeks.   If you need a refill on your cardiac medications before your next appointment, please call your pharmacy.

## 2017-01-11 NOTE — Telephone Encounter (Signed)
Needs to know latest dosage for warfarin and how to take

## 2017-01-11 NOTE — Progress Notes (Signed)
SUBJECTIVE: The patient presents for follow-up of atrial fibrillation and diastolic heart failure. ECG on 01/07/17 showed rapid atrial fibrillation, heart rate 116 bpm. Currently taking 480 mg of long-acting diltiazem and Toprol XL 50 mg twice daily.  INR: 2.3 on 01/15/17, 1.8 (12/28/16).  She felt dizzy with higher doses of metoprolol.  She was not able to take alternative anticoagulation due to their interaction with Tegretol.  She has a history of esophageal narrowing and dilatation.  Blood pressures at home have ranged from 90/60 up to 139/96. Weight has primarily been in the 162 pound range. Average blood pressures have ranged from 120/80-90. Heart rates have ranged from 60 up to 94 bpm.    Review of Systems: As per "subjective", otherwise negative.  Allergies  Allergen Reactions  . Codeine Nausea Only    Current Outpatient Prescriptions  Medication Sig Dispense Refill  . atorvastatin (LIPITOR) 10 MG tablet Take 10 mg by mouth daily.    . carbamazepine (TEGRETOL) 200 MG tablet Take 1 tablet (200 mg total) by mouth 3 (three) times daily. 90 tablet 0  . diltiazem (CARDIZEM CD) 240 MG 24 hr capsule Take 2 capsules (480 mg total) by mouth daily. 60 capsule 3  . furosemide (LASIX) 20 MG tablet Take 20 mg by mouth daily.    . metoprolol succinate (TOPROL-XL) 50 MG 24 hr tablet Take 50 mg by mouth 2 (two) times daily. Take with or immediately following a meal.    . pantoprazole (PROTONIX) 40 MG tablet Take 40 mg by mouth daily.   4  . potassium chloride (K-DUR,KLOR-CON) 10 MEQ tablet Take 1 tablet (10 mEq total) by mouth 2 (two) times daily. 30 tablet 2  . warfarin (COUMADIN) 2.5 MG tablet Take 2 tablets (5 mg total) by mouth daily at 6 PM. 60 tablet 0   No current facility-administered medications for this visit.     Past Medical History:  Diagnosis Date  . Anxiety   . Atrial fibrillation with RVR (HCC) 12/01/2016  . CKD (chronic kidney disease), stage III 12/12/2016  .  Depression   . Dysphagia   . GERD (gastroesophageal reflux disease) 12/01/2016  . Heart murmur   . History of esophageal stricture 12/10/2016  . Hyperlipidemia   . Hypertension    x 4 months  . Presbyesophagus 12/11/2016  . Pulmonary emboli (HCC) 12/01/2016  . Seizures (HCC)   . Stroke [facility])     Past Surgical History:  Procedure Laterality Date  . ABDOMINAL HYSTERECTOMY    . BREAST ENHANCEMENT SURGERY     40 yrs ago  . PARTIAL HYSTERECTOMY    . 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 Marital status: Married    Spouse name: george  . Number of children: 4  . Years of education: 12   Occupational History  . retired     Neurosurgeon   Social History Main Topics  . Smoking status: Never Smoker  . Smokeless tobacco: Never Used  . Alcohol use No  . Drug use: No  . Sexual activity: Not Currently   Other Topics Concern  . Not on file   Social History Narrative   Lives with Greggory Stallion   many pets     Vitals:   01/11/17 1324  BP: 118/60  Pulse: (!) 108  SpO2: 98%  Weight: 167 lb 3.2 oz (75.8 kg)  Height: 5\' 5"  (1.651 m)  PHYSICAL EXAM General: NAD HEENT: Normal. Neck: No JVD, no thyromegaly. Lungs: Clear to auscultation bilaterally with normal respiratory effort. CV: Nondisplaced PMI.  Tachycardic, irregular rhythm, normal S1/S2, no S3, no murmur. No pretibial or periankle edema.  Abdomen: Soft, nontender, no distention.  Neurologic: Alert and oriented.  Psych: Normal affect. Skin: Normal. Musculoskeletal: No gross deformities.    ECG: Most recent ECG reviewed.      ASSESSMENT AND PLAN: 1. Rapid atrial fibrillation: Currently taking long-acting diltiazem 480 mg daily and Toprol-XL 50 mg twice daily for rate control. Will increase to 75 mg bid and have her return for an ECG in two weeks. She is anticoagulated with warfarin. INR: 2.3 on 01/15/17 Will obtain echocardiogram report performed at [facility]. She  may need TEE/cardioversion as she was previously unable to tolerate higher doses of metoprolol.  However, given her history of esophageal narrowing and dilatation, she would have to be evaluated by GI first.  2. Bilateral pulmonary emboli: Anticoagulated with warfarin. INR: 2.3 on 01/15/17.  3. Chronic diastolic heart failure: Euvolemic on Lasix 40 mg daily.  4. Hypertension: Controlled. Monitor given increase in metoprolol dose.  Dispo: fu 1 month.  Time spent: 40 minutes, of which greater than 50% was spent reviewing symptoms, relevant blood tests and studies, and discussing management plan with the patient.   Prentice Docker, M.D., F.A.C.C.

## 2017-01-11 NOTE — Telephone Encounter (Signed)
Verified coumadin dosage with daughter.  She doesn't think patient took coumadin right last week.  INR scheduled to be rechecked on 2/14 by [facility].  Will readjust dose then if needed.

## 2017-01-12 ENCOUNTER — Encounter: Payer: Self-pay | Admitting: *Deleted

## 2017-01-13 ENCOUNTER — Ambulatory Visit (INDEPENDENT_AMBULATORY_CARE_PROVIDER_SITE_OTHER): Payer: Medicare Other | Admitting: *Deleted

## 2017-01-13 DIAGNOSIS — I4891 Unspecified atrial fibrillation: Secondary | ICD-10-CM

## 2017-01-13 DIAGNOSIS — Z5181 Encounter for therapeutic drug level monitoring: Secondary | ICD-10-CM

## 2017-01-13 DIAGNOSIS — Z7901 Long term (current) use of anticoagulants: Secondary | ICD-10-CM

## 2017-01-13 LAB — POCT INR: INR: 2.8

## 2017-01-14 ENCOUNTER — Telehealth: Payer: Self-pay

## 2017-01-14 ENCOUNTER — Other Ambulatory Visit: Payer: Self-pay

## 2017-01-14 MED ORDER — CARBAMAZEPINE ER 100 MG PO TB12: 100.0000 mg | ORAL_TABLET | Freq: Two times a day (BID) | ORAL | 1 refills | Status: DC

## 2017-01-14 NOTE — Telephone Encounter (Signed)
Per dr Delton See, called Karen Richardson, instructed to reduce her tegretol to 200 mg twice daily from 3 times daily, and to re check lab level in 1 week. Adonai states understanding.

## 2017-01-14 NOTE — Telephone Encounter (Signed)
Mayan called office, stating she is only taking tegretol 200 mg bid now.

## 2017-01-15 ENCOUNTER — Ambulatory Visit: Payer: Medicare Other | Admitting: Adult Health

## 2017-01-18 ENCOUNTER — Telehealth: Payer: Self-pay

## 2017-01-18 NOTE — Telephone Encounter (Signed)
Referral has already been placed, just change doctors

## 2017-01-18 NOTE — Telephone Encounter (Signed)
Daughter Dot Been)( called regarding the referral to the GI, Dr Darrick Penna to check on the status of it. It was denied in the system because she was already established with Dr Karilyn Cota. Needs to see someone asap because cardiology is waiting to do a cardioversion after the throat problem is addressed. Please advise  [phone]

## 2017-01-19 NOTE — Telephone Encounter (Signed)
Noted referral has been changed to Dr. Karilyn Cota

## 2017-01-20 ENCOUNTER — Ambulatory Visit (INDEPENDENT_AMBULATORY_CARE_PROVIDER_SITE_OTHER): Payer: Medicare Other | Admitting: *Deleted

## 2017-01-20 DIAGNOSIS — Z5181 Encounter for therapeutic drug level monitoring: Secondary | ICD-10-CM

## 2017-01-20 DIAGNOSIS — I4891 Unspecified atrial fibrillation: Secondary | ICD-10-CM

## 2017-01-20 DIAGNOSIS — Z7901 Long term (current) use of anticoagulants: Secondary | ICD-10-CM

## 2017-01-20 LAB — POCT INR: INR: 1.4

## 2017-01-21 ENCOUNTER — Encounter (INDEPENDENT_AMBULATORY_CARE_PROVIDER_SITE_OTHER): Payer: Self-pay | Admitting: Internal Medicine

## 2017-01-25 ENCOUNTER — Ambulatory Visit (INDEPENDENT_AMBULATORY_CARE_PROVIDER_SITE_OTHER): Payer: Medicare Other | Admitting: *Deleted

## 2017-01-25 DIAGNOSIS — I4891 Unspecified atrial fibrillation: Secondary | ICD-10-CM

## 2017-01-25 MED ORDER — METOPROLOL SUCCINATE ER 100 MG PO TB24: ORAL_TABLET | ORAL | 3 refills | Status: DC

## 2017-01-25 NOTE — Progress Notes (Signed)
PT here for EKG per LOV increased Toprol XL 75 mg twice daily - pt says she has been compliant with dose change - pt denies any symptoms only c/o back pain and thinks this is from laying down to much - denies palpitations since dose increase - EKG done and sent to be scanned for MD review

## 2017-01-25 NOTE — Progress Notes (Signed)
Pt aware voiced understanding - will take 2 of the 50 mg Toprol XL tablets bid until finishes - new rx sent to [facility] as requested

## 2017-01-25 NOTE — Progress Notes (Signed)
HR still not well controlled. Would increase Toprol-XL to 100 mg bid if she can tolerate.

## 2017-01-25 NOTE — Addendum Note (Signed)
Addended by: Burman Nieves T on: 01/25/2017 01:00 PM   Modules accepted: Orders

## 2017-01-26 ENCOUNTER — Telehealth: Payer: Self-pay | Admitting: Cardiovascular Disease

## 2017-01-26 NOTE — Telephone Encounter (Signed)
Daughter would like to know what medication changes have been made

## 2017-01-26 NOTE — Telephone Encounter (Signed)
Daughter Dot Been) informed that her Toprol XL was increased to 100mg  twice a day & new prescription was sent to [facility] pharmacy as well.

## 2017-01-27 ENCOUNTER — Ambulatory Visit (INDEPENDENT_AMBULATORY_CARE_PROVIDER_SITE_OTHER): Payer: Medicare Other | Admitting: *Deleted

## 2017-01-27 DIAGNOSIS — Z7901 Long term (current) use of anticoagulants: Secondary | ICD-10-CM

## 2017-01-27 DIAGNOSIS — Z5181 Encounter for therapeutic drug level monitoring: Secondary | ICD-10-CM | POA: Diagnosis not present

## 2017-01-27 DIAGNOSIS — I4891 Unspecified atrial fibrillation: Secondary | ICD-10-CM

## 2017-01-27 LAB — POCT INR: INR: 1.7

## 2017-01-28 ENCOUNTER — Encounter: Payer: Self-pay | Admitting: Family Medicine

## 2017-01-28 ENCOUNTER — Ambulatory Visit (INDEPENDENT_AMBULATORY_CARE_PROVIDER_SITE_OTHER): Payer: Medicare Other | Admitting: Family Medicine

## 2017-01-28 VITALS — BP 120/82 | HR 108 | Temp 97.4°F | Resp 18 | Ht 65.0 in | Wt 172.0 lb

## 2017-01-28 DIAGNOSIS — I4891 Unspecified atrial fibrillation: Secondary | ICD-10-CM

## 2017-01-28 DIAGNOSIS — K219 Gastro-esophageal reflux disease without esophagitis: Secondary | ICD-10-CM | POA: Diagnosis not present

## 2017-01-28 DIAGNOSIS — G40909 Epilepsy, unspecified, not intractable, without status epilepticus: Secondary | ICD-10-CM

## 2017-01-28 DIAGNOSIS — R42 Dizziness and giddiness: Secondary | ICD-10-CM

## 2017-01-28 DIAGNOSIS — I10 Essential (primary) hypertension: Secondary | ICD-10-CM

## 2017-01-28 NOTE — Progress Notes (Signed)
Chief Complaint  Patient presents with  . Follow-up   Feeling better Energy picking up Still having trouble regulating heart rate - it is over 100 again today BP is good Appetite good No falls Compliant with medicine Sees GI next week.  Needs esophageal dilation prior to trans esoph Echo./ then ?ablation per patient She had a high tegretol level and the dose was reduced.  She feels well.  No seizures.  Patient Active Problem List   Diagnosis Date Noted  . Dizziness 01/07/2017  . Monitoring for long-term anticoagulant use 12/18/2016  . CKD (chronic kidney disease), stage III 12/12/2016  . Seizure disorder (HCC) 12/12/2016  . Presbyesophagus 12/11/2016  . History of esophageal stricture 12/10/2016  . Dysphagia   . AKI (acute kidney injury) (HCC)   . Atrial fibrillation with RVR (HCC) 12/01/2016  . Pulmonary emboli (HCC) 12/01/2016  . GERD (gastroesophageal reflux disease) 12/01/2016  . Arthralgia of both knees 01/11/2016  . Hypertension 09/22/2011    Outpatient Encounter Prescriptions as of 01/28/2017  Medication Sig  . atorvastatin (LIPITOR) 10 MG tablet Take 10 mg by mouth daily.  . carbamazepine (TEGRETOL XR) 100 MG 12 hr tablet Take 1 tablet (100 mg total) by mouth 2 (two) times daily.  Marland Kitchen diltiazem (CARDIZEM CD) 240 MG 24 hr capsule Take 2 capsules (480 mg total) by mouth daily.  . furosemide (LASIX) 20 MG tablet Take 20 mg by mouth daily.  . metoprolol succinate (TOPROL-XL) 100 MG 24 hr tablet Take 1 tablet twice daily  . pantoprazole (PROTONIX) 40 MG tablet Take 40 mg by mouth daily.   . potassium chloride (K-DUR,KLOR-CON) 10 MEQ tablet Take 1 tablet (10 mEq total) by mouth 2 (two) times daily.  Marland Kitchen warfarin (COUMADIN) 2.5 MG tablet Take 2 tablets (5 mg total) by mouth daily at 6 PM.   No facility-administered encounter medications on file as of 01/28/2017.     Allergies  Allergen Reactions  . Codeine Nausea Only    Review of Systems  Constitutional: Negative  for activity change, appetite change, fatigue and unexpected weight change.  HENT: Positive for trouble swallowing. Negative for congestion and dental problem.   Eyes: Negative.  Negative for visual disturbance.  Respiratory: Negative.  Negative for cough and shortness of breath.   Cardiovascular: Positive for palpitations. Negative for chest pain and leg swelling.  Gastrointestinal: Negative for constipation and diarrhea.  Genitourinary: Negative for difficulty urinating and frequency.  Musculoskeletal: Negative for arthralgias and back pain.  Neurological: Positive for dizziness. Negative for seizures.  Psychiatric/Behavioral: Negative for dysphoric mood. The patient is not nervous/anxious.     BP 120/82 (BP Location: Right Arm, Patient Position: Sitting, Cuff Size: Large)   Pulse (!) 108   Temp 97.4 F (36.3 C) (Temporal)   Resp 18   Ht 5\' 5"  (1.651 m)   Wt 172 lb (78 kg)   SpO2 95%   BMI 28.62 kg/m   Physical Exam  Constitutional: She is oriented to person, place, and time. She appears well-developed and well-nourished. No distress.  Smiling.  energetic  HENT:  Head: Normocephalic and atraumatic.  Nose: Nose normal.  Eyes: Pupils are equal, round, and reactive to light.  Neck: Normal range of motion.  Cardiovascular: An irregularly irregular rhythm present. Tachycardia present.   Pulmonary/Chest: Effort normal and breath sounds normal. She has no rales.  Musculoskeletal: She exhibits no edema.  Lymphadenopathy:    She has no cervical adenopathy.  Neurological: She is alert and oriented to  person, place, and time.  Psychiatric: She has a normal mood and affect. Her behavior is normal. Thought content normal.    ASSESSMENT/PLAN:  1. Atrial fibrillation with RVR (HCC)   2. Essential hypertension   3. Gastroesophageal reflux disease, esophagitis presence not specified   4. Seizure disorder (HCC)  - Carbamazepine Level (Tegretol), total  5.  Dizziness    Patient Instructions  Your blood pressure is good The heart rate is still fast See your cardiologist as scheduled See the GI doctor as scheduled March 7th  Need to get another tegretol blood level with your next blood test  See me in a month or two Call sooner for problems   Karen Moore, MD

## 2017-01-28 NOTE — Patient Instructions (Addendum)
Your blood pressure is good The heart rate is still fast See your cardiologist as scheduled See the GI doctor as scheduled March 7th  Need to get another tegretol blood level with your next blood test  See me in a month or two Call sooner for problems

## 2017-02-03 ENCOUNTER — Other Ambulatory Visit (INDEPENDENT_AMBULATORY_CARE_PROVIDER_SITE_OTHER): Payer: Self-pay | Admitting: Internal Medicine

## 2017-02-03 ENCOUNTER — Ambulatory Visit (INDEPENDENT_AMBULATORY_CARE_PROVIDER_SITE_OTHER): Payer: Medicare Other | Admitting: *Deleted

## 2017-02-03 ENCOUNTER — Telehealth: Payer: Self-pay

## 2017-02-03 ENCOUNTER — Encounter: Payer: Self-pay | Admitting: *Deleted

## 2017-02-03 ENCOUNTER — Encounter (INDEPENDENT_AMBULATORY_CARE_PROVIDER_SITE_OTHER): Payer: Self-pay | Admitting: Internal Medicine

## 2017-02-03 ENCOUNTER — Telehealth: Payer: Self-pay | Admitting: Cardiovascular Disease

## 2017-02-03 ENCOUNTER — Ambulatory Visit (INDEPENDENT_AMBULATORY_CARE_PROVIDER_SITE_OTHER): Payer: Medicare Other | Admitting: Internal Medicine

## 2017-02-03 ENCOUNTER — Encounter (INDEPENDENT_AMBULATORY_CARE_PROVIDER_SITE_OTHER): Payer: Self-pay

## 2017-02-03 ENCOUNTER — Encounter (INDEPENDENT_AMBULATORY_CARE_PROVIDER_SITE_OTHER): Payer: Self-pay | Admitting: *Deleted

## 2017-02-03 VITALS — BP 114/80 | HR 72 | Temp 97.7°F | Ht 65.0 in | Wt 174.4 lb

## 2017-02-03 DIAGNOSIS — R1310 Dysphagia, unspecified: Secondary | ICD-10-CM

## 2017-02-03 DIAGNOSIS — Z5181 Encounter for therapeutic drug level monitoring: Secondary | ICD-10-CM

## 2017-02-03 DIAGNOSIS — R1319 Other dysphagia: Secondary | ICD-10-CM

## 2017-02-03 DIAGNOSIS — Z7901 Long term (current) use of anticoagulants: Secondary | ICD-10-CM

## 2017-02-03 DIAGNOSIS — I4891 Unspecified atrial fibrillation: Secondary | ICD-10-CM

## 2017-02-03 LAB — POCT INR: INR: 2.1

## 2017-02-03 NOTE — Telephone Encounter (Signed)
Is having an endoscopy done and will need to stop her coumadin 5 days before procedure. Ann needs written permission faxed over that this is ok before they can instruct patient. Please advise   Dewayne Hatch (Dr Patty Sermons office) [phone]

## 2017-02-03 NOTE — Patient Instructions (Signed)
EGD/ED. The risks and benefits such as perforation, bleeding, and infection were reviewed with the patient and is agreeable. 

## 2017-02-03 NOTE — Telephone Encounter (Signed)
Daughter would like to know what medications she is taking and what changes have been made

## 2017-02-03 NOTE — Progress Notes (Addendum)
Subjective:    Patient ID: Karen Richardson, female    DOB: March 11, 1934, 83 y.o.   MRN: [id number]  HPI Referred  by Rica Mast for dysphagia. Underwent an esophagram on 12/10/2016 which revealed a smooth lower esohageal stricture.  Admitted to [facility] with palpitations, and SOB. She had been transferred from [facility]  ER She was in AFIB.  She underwent a CT chest which showed RLL and RML PE.and evidence of RV strain.  Family requested transfer to [facility] She has had dysphagia for about 3 yrs off and on.  Hx of dysphagia and was dilated about 5-8 yrs ago at [facility].  Appetite is good. No weight loss. Usually has a BM x 2 a day. No melena or BRRB. Breads and french fries  bother her.  Maintained on Coumadin for atrial fib/ PE 2 months ago (Atrial fib, new onset in January of this year)   Hx of CKD, seizures, PE, depression, high cholesterol, hypertension, heart murmur.   12/10/2016 DG Esophagram: dysphagia  IMPRESSION: 1. Mild smooth lower esophageal stricture which caused transient sticking of a 13 mm barium tablet. Narrowing is likely peptic; gastroesophageal reflux was seen during this exam. 2. Presbyesophagus which may contribute to food sticking.  08/12/2016 Colonoscopy: change in bowel habits and fecal incontinence:  Mild diverticulosis was noted in the left colon. Sessile polyp ranging between 9-mm in size was found in the ascending colon: polypectomy was performed using snare cautery.  Sessile polyp was found in the transverse colon; endoscopic mucosal resection was performed. Destruction of the tissue via ablation was attempted. Ablation was done on the edges of the polypectomy site, no visible polyp remained.  Biopsy:   ascendig polyps and transverse polyp: tubular adenoma.   03/02/2012 EGD with biopsy: ED at GE junction: dysphagia, heartburn. Biopsy:  Acutely inflamed GE tissue.  Moderate gastritis in the antrum. Nodular mucosa at the GE junctin. Schatzki' ring at the GE junction.  Esophagitis.   CBC    Component Value Date/Time   WBC 6.1 12/13/2016 0520   RBC 4.16 12/13/2016 0520   HGB 13.1 12/13/2016 0520   HCT 39.5 12/13/2016 0520   PLT 201 12/13/2016 0520   MCV 95.0 12/13/2016 0520   MCH 31.5 12/13/2016 0520   MCHC 33.2 12/13/2016 0520   RDW 15.9 (H) 12/13/2016 0520   LYMPHSABS 1.8 12/02/2016 0011   MONOABS 0.7 12/02/2016 0011   EOSABS 0.1 12/02/2016 0011   BASOSABS 0.0 12/02/2016 0011   CMP Latest Ref Rng & Units 12/12/2016 12/11/2016 12/10/2016  Glucose 65 - 99 mg/dL 92 409(W) 98  BUN 6 - 20 mg/dL 9 11 13   Creatinine 0.44 - 1.00 mg/dL 1.19(J) 4.78(G) 9.56(O)  Sodium 135 - 145 mmol/L 137 140 140  Potassium 3.5 - 5.1 mmol/L 3.6 3.6 3.4(L)  Chloride 101 - 111 mmol/L 96(L) 100(L) 106  CO2 22 - 32 mmol/L 30 28 25   Calcium 8.9 - 10.3 mg/dL 9.0 1.3(Y) 8.3(L)  Total Protein 6.5 - 8.1 g/dL - - -  Total Bilirubin 0.3 - 1.2 mg/dL - - -  Alkaline Phos 38 - 126 U/L - - -  AST 15 - 41 U/L - - -  ALT 14 - 54 U/L - - -      Review of Systems     Past Medical History:  Diagnosis Date  . Anxiety   . Atrial fibrillation with RVR (HCC) 12/01/2016  . CKD (chronic kidney disease), stage III 12/12/2016  . Depression   . Dysphagia   .  GERD (gastroesophageal reflux disease) 12/01/2016  . Heart murmur   . History of esophageal stricture 12/10/2016  . Hyperlipidemia   . Hypertension    x 4 months  . Presbyesophagus 12/11/2016  . Pulmonary emboli (HCC) 12/01/2016  . Seizures (HCC)   . Stroke [facility])     Past Surgical History:  Procedure Laterality Date  . ABDOMINAL HYSTERECTOMY    . BREAST ENHANCEMENT SURGERY     40 yrs ago  . PARTIAL HYSTERECTOMY    . Tumor removed from spinal cord     benign    Allergies  Allergen Reactions  . Codeine Nausea Only    Current Outpatient Prescriptions on File Prior to Visit  Medication Sig Dispense Refill  . atorvastatin (LIPITOR) 10 MG tablet Take 10 mg by mouth daily.    . carbamazepine (TEGRETOL XR) 100 MG 12 hr  tablet Take 1 tablet (100 mg total) by mouth 2 (two) times daily. 60 tablet 1  . diltiazem (CARDIZEM CD) 240 MG 24 hr capsule Take 2 capsules (480 mg total) by mouth daily. 60 capsule 3  . furosemide (LASIX) 20 MG tablet Take 20 mg by mouth daily.    . metoprolol succinate (TOPROL-XL) 100 MG 24 hr tablet Take 1 tablet twice daily 60 tablet 3  . pantoprazole (PROTONIX) 40 MG tablet Take 40 mg by mouth daily.   4  . potassium chloride (K-DUR,KLOR-CON) 10 MEQ tablet Take 1 tablet (10 mEq total) by mouth 2 (two) times daily. 30 tablet 2  . warfarin (COUMADIN) 2.5 MG tablet Take 2 tablets (5 mg total) by mouth daily at 6 PM. 60 tablet 5   No current facility-administered medications on file prior to visit.     Objective:   Physical Exam Blood pressure 114/80, pulse 72, temperature 97.7 F (36.5 C), height 5\' 5"  (1.651 m), weight 174 lb 6.4 oz (79.1 kg). Alert and oriented. Skin warm and dry. Oral mucosa is moist.   . Sclera anicteric, conjunctivae is pink. Thyroid not enlarged. No cervical lymphadenopathy. Lungs clear. Heart regular rate and rhythm.  Abdomen is soft. Bowel sounds are positive. No hepatomegaly. No abdominal masses felt. No tenderness.  No edema to lower extremities.         Assessment & Plan:  Dysphagia.  DG esophagram reveal esophageal stricture. EGD/ED. The risks and benefits such as perforation, bleeding, and infection were reviewed with the patient and is agreeable.

## 2017-02-04 ENCOUNTER — Encounter: Payer: Self-pay | Admitting: Family Medicine

## 2017-02-04 NOTE — Telephone Encounter (Signed)
Letter sent to you 

## 2017-02-08 ENCOUNTER — Telehealth: Payer: Self-pay | Admitting: Cardiovascular Disease

## 2017-02-08 ENCOUNTER — Encounter (HOSPITAL_COMMUNITY): Payer: Self-pay | Admitting: Emergency Medicine

## 2017-02-08 ENCOUNTER — Emergency Department (HOSPITAL_COMMUNITY): Payer: Medicare Other

## 2017-02-08 ENCOUNTER — Telehealth: Payer: Self-pay | Admitting: Family Medicine

## 2017-02-08 ENCOUNTER — Emergency Department (HOSPITAL_COMMUNITY)
Admission: EM | Admit: 2017-02-08 | Discharge: 2017-02-08 | Disposition: A | Discharge status: Home / Self Care | Payer: Medicare Other | Attending: Emergency Medicine | Admitting: Emergency Medicine

## 2017-02-08 DIAGNOSIS — N183 Chronic kidney disease, stage 3 (moderate): Secondary | ICD-10-CM | POA: Insufficient documentation

## 2017-02-08 DIAGNOSIS — R0602 Shortness of breath: Secondary | ICD-10-CM | POA: Diagnosis not present

## 2017-02-08 DIAGNOSIS — Z79899 Other long term (current) drug therapy: Secondary | ICD-10-CM | POA: Diagnosis not present

## 2017-02-08 DIAGNOSIS — R1084 Generalized abdominal pain: Secondary | ICD-10-CM | POA: Diagnosis present

## 2017-02-08 DIAGNOSIS — I129 Hypertensive chronic kidney disease with stage 1 through stage 4 chronic kidney disease, or unspecified chronic kidney disease: Secondary | ICD-10-CM | POA: Diagnosis not present

## 2017-02-08 LAB — URINALYSIS, ROUTINE W REFLEX MICROSCOPIC
Bacteria, UA: NONE SEEN
Bilirubin Urine: NEGATIVE
GLUCOSE, UA: NEGATIVE mg/dL
HGB URINE DIPSTICK: NEGATIVE
KETONES UR: NEGATIVE mg/dL
NITRITE: NEGATIVE
PROTEIN: 30 mg/dL — AB
Specific Gravity, Urine: 1.012 (ref 1.005–1.030)
pH: 5 (ref 5.0–8.0)

## 2017-02-08 LAB — COMPREHENSIVE METABOLIC PANEL
ALT: 35 U/L (ref 14–54)
ANION GAP: 11 (ref 5–15)
AST: 34 U/L (ref 15–41)
Albumin: 4.2 g/dL (ref 3.5–5.0)
Alkaline Phosphatase: 79 U/L (ref 38–126)
BILIRUBIN TOTAL: 0.6 mg/dL (ref 0.3–1.2)
BUN: 21 mg/dL — ABNORMAL HIGH (ref 6–20)
CALCIUM: 9.1 mg/dL (ref 8.9–10.3)
CO2: 23 mmol/L (ref 22–32)
Chloride: 107 mmol/L (ref 101–111)
Creatinine, Ser: 1.3 mg/dL — ABNORMAL HIGH (ref 0.44–1.00)
GFR calc non Af Amer: 38 mL/min — ABNORMAL LOW (ref >60)
GFR, EST AFRICAN AMERICAN: 44 mL/min — AB (ref >60)
Glucose, Bld: 116 mg/dL — ABNORMAL HIGH (ref 65–99)
Potassium: 3.8 mmol/L (ref 3.5–5.1)
Sodium: 141 mmol/L (ref 135–145)
TOTAL PROTEIN: 7.1 g/dL (ref 6.5–8.1)

## 2017-02-08 LAB — CBC
HCT: 39.9 % (ref 36.0–46.0)
HEMOGLOBIN: 13.3 g/dL (ref 12.0–15.0)
MCH: 32.4 pg (ref 26.0–34.0)
MCHC: 33.3 g/dL (ref 30.0–36.0)
MCV: 97.1 fL (ref 78.0–100.0)
Platelets: 191 10*3/uL (ref 150–400)
RBC: 4.11 MIL/uL (ref 3.87–5.11)
RDW: 16.6 % — ABNORMAL HIGH (ref 11.5–15.5)
WBC: 8.7 10*3/uL (ref 4.0–10.5)

## 2017-02-08 LAB — BRAIN NATRIURETIC PEPTIDE: B Natriuretic Peptide: 565 pg/mL — ABNORMAL HIGH (ref 0.0–100.0)

## 2017-02-08 LAB — LIPASE, BLOOD: Lipase: 21 U/L (ref 11–51)

## 2017-02-08 LAB — TROPONIN I: Troponin I: <0.03 ng/mL (ref <0.03)

## 2017-02-08 MED ORDER — DIPHENHYDRAMINE HCL 50 MG/ML IJ SOLN
12.5000 mg | Freq: Once | INTRAMUSCULAR | Status: AC
  Administered 2017-02-08: 12.5 mg via INTRAVENOUS
  Filled 2017-02-08: qty 1

## 2017-02-08 MED ORDER — DIPHENHYDRAMINE HCL 50 MG/ML IJ SOLN: 12.5000 mg | Freq: Once | INTRAMUSCULAR | Status: DC

## 2017-02-08 MED ORDER — PROMETHAZINE HCL 25 MG/ML IJ SOLN
12.5000 mg | Freq: Once | INTRAMUSCULAR | Status: AC
  Administered 2017-02-08: 12.5 mg via INTRAVENOUS
  Filled 2017-02-08: qty 1

## 2017-02-08 MED ORDER — ONDANSETRON HCL 4 MG/2ML IJ SOLN
4.0000 mg | Freq: Once | INTRAMUSCULAR | Status: AC
  Administered 2017-02-08: 4 mg via INTRAVENOUS
  Filled 2017-02-08: qty 2

## 2017-02-08 MED ORDER — SODIUM CHLORIDE 0.9 % IV SOLN
INTRAVENOUS | Status: DC
  Administered 2017-02-08: 17:00:00 via INTRAVENOUS

## 2017-02-08 MED ORDER — IOPAMIDOL (ISOVUE-300) INJECTION 61%
INTRAVENOUS | Status: AC
  Filled 2017-02-08: qty 30

## 2017-02-08 MED ORDER — PROMETHAZINE HCL 25 MG PO TABS: 25.0000 mg | ORAL_TABLET | Freq: Four times a day (QID) | ORAL | 1 refills | Status: DC | PRN

## 2017-02-08 NOTE — Telephone Encounter (Signed)
Daughter wanting to go over medication due to mother having a very bad weekend  She is scared she has messed her dosage up.  She feels like she is hallucinating and talking out of her head.  State she never received a call back last week in reference to her  Wanting to confirm her medication.  She says there is a conflict with what they have and my chart.     Parents number [phone]

## 2017-02-08 NOTE — Discharge Instructions (Signed)
CT scan of the abdomen without significant findings that show a small amount of fluid in the abdomen. Would recommend close follow-up with your cardiologist. Rest of workup was negative.  Since you get itching with the Zofran. We'll go ahead and treat you with the Phenergan for the nausea. Could make you very drowsy.

## 2017-02-08 NOTE — Telephone Encounter (Signed)
I believe the tegretol was reduced to 100 mg BID based on an elevated tegretol level.  I ordered a repeat level but have not seen it yet.  Her seizure care should be under management of a neurologist, and I have placed a consult request.

## 2017-02-08 NOTE — Telephone Encounter (Signed)
Spoke with pt daughter and pt husband - agreed for ED evaluation

## 2017-02-08 NOTE — ED Notes (Signed)
Patient had vomited on herself. Cleaned patient and changed bed. Patient resting comfortably.

## 2017-02-08 NOTE — Telephone Encounter (Signed)
Agree with ED evaluation. 

## 2017-02-08 NOTE — Telephone Encounter (Signed)
Spoke with Graciella Belton (daughter) again today explaining pt should be on Toprol XL 100 mg bid and Diltiazem 480 mg daily - pt daughter voiced understanding and also that Dr Purvis Sheffield recommend as per previous phone notes that pt should be evaluated at [facility] - Graciella Belton says that they are trying to convince pt to get evaluated at ED

## 2017-02-08 NOTE — Telephone Encounter (Signed)
Karen Richardson calling back again about medication.  She wants clarification on diltiazem and metoprolol succinate

## 2017-02-08 NOTE — Telephone Encounter (Signed)
See 3/12 phone note

## 2017-02-08 NOTE — Telephone Encounter (Signed)
Pt husband explained that we did try to call (see previous phone note) X 3 times on 3/7 - Pt caregiver says pt has only been taking diltiazem 240 mg instead of 480 mg as directed - says she has been "feeling bad" - c/o SOB/ABD pain/weakness and some chest discomfort for the last 3 days - says HR has been running 90-110 - says she has gained around 10 lbs in the last month - will route to Dr Purvis Sheffield but suggested that pt be evaluated at ED with ongoing symptoms for 3 days - pt caregiver (husband) says he would take pt to ED if ongoing symptoms continued but pt declined to go at this time

## 2017-02-08 NOTE — Telephone Encounter (Signed)
Garinger daughter is calling asking about Tegratol dosage, they are confused about dosage and which Dr. Is over that Rx, please advise?

## 2017-02-08 NOTE — ED Provider Notes (Addendum)
[facility]-EMERGENCY DEPT Provider Note   CSN: [id number] Arrival date & time: 02/08/17  1355     History   Chief Complaint Chief Complaint  Patient presents with  . Abdominal Pain    HPI Karen Richardson is a 83 y.o. female.  Patient presents with complaint of 3 days of generalized abdominal pain associated with some nausea. Did have some loose bowel movements but that was not unusual for her. Also generalized weakness no fever. No vomiting of blood or blood in the bowel movements. Patient has a history of chronic atrial fib. Patient is on Coumadin.      Past Medical History:  Diagnosis Date  . Anxiety   . Atrial fibrillation with RVR (HCC) 12/01/2016  . CKD (chronic kidney disease), stage III 12/12/2016  . Depression   . Dysphagia   . GERD (gastroesophageal reflux disease) 12/01/2016  . Heart murmur   . History of esophageal stricture 12/10/2016  . Hyperlipidemia   . Hypertension    x 4 months  . Presbyesophagus 12/11/2016  . Pulmonary emboli (HCC) 12/01/2016  . Seizures (HCC)   . Stroke [facility])     Patient Active Problem List   Diagnosis Date Noted  . Esophageal dysphagia 02/03/2017  . Dizziness 01/07/2017  . Monitoring for long-term anticoagulant use 12/18/2016  . CKD (chronic kidney disease), stage III 12/12/2016  . Seizure disorder (HCC) 12/12/2016  . Presbyesophagus 12/11/2016  . History of esophageal stricture 12/10/2016  . Dysphagia   . AKI (acute kidney injury) (HCC)   . Atrial fibrillation with RVR (HCC) 12/01/2016  . Pulmonary emboli (HCC) 12/01/2016  . GERD (gastroesophageal reflux disease) 12/01/2016  . Arthralgia of both knees 01/11/2016  . Hypertension 09/22/2011    Past Surgical History:  Procedure Laterality Date  . ABDOMINAL HYSTERECTOMY    . BREAST ENHANCEMENT SURGERY     40 yrs ago  . PARTIAL HYSTERECTOMY    . Tumor removed from spinal cord     benign    OB History    No data available       Home Medications    Prior to Admission  medications   Medication Sig Start Date End Date Taking? Authorizing Provider  atorvastatin (LIPITOR) 10 MG tablet Take 10 mg by mouth daily.   Yes Historical Provider, MD  carbamazepine (TEGRETOL XR) 100 MG 12 hr tablet Take 1 tablet (100 mg total) by mouth 2 (two) times daily. 01/14/17  Yes Eustace Moore, MD  diltiazem (CARDIZEM CD) 240 MG 24 hr capsule Take 2 capsules (480 mg total) by mouth daily. 01/05/17 04/05/17 Yes Laqueta Linden, MD  furosemide (LASIX) 20 MG tablet Take 20 mg by mouth daily.   Yes Historical Provider, MD  metoprolol succinate (TOPROL-XL) 100 MG 24 hr tablet Take 1 tablet twice daily Patient taking differently: Take 100 mg by mouth 2 (two) times daily. Take 1 tablet twice daily 01/25/17  Yes Laqueta Linden, MD  pantoprazole (PROTONIX) 40 MG tablet Take 40 mg by mouth daily.  12/19/15  Yes Historical Provider, MD  potassium chloride (K-DUR,KLOR-CON) 10 MEQ tablet Take 1 tablet (10 mEq total) by mouth 2 (two) times daily. 12/14/16  Yes Maryruth Bun Rama, MD  warfarin (COUMADIN) 2.5 MG tablet Take 2 tablets (5 mg total) by mouth daily at 6 PM. 01/11/17  Yes Eustace Moore, MD    Family History Family History  Problem Relation Age of Onset  . Pulmonary embolism Mother   . Cancer Mother  mass in colon  . Stroke Father   . Alcohol abuse Father     Social History Social History  Substance Use Topics  . Smoking status: Never Smoker  . Smokeless tobacco: Never Used  . Alcohol use No     Allergies   Codeine   Review of Systems Review of Systems  Constitutional: Negative for fever.  HENT: Negative for congestion.   Eyes: Negative for visual disturbance.  Respiratory: Negative for shortness of breath.   Cardiovascular: Negative for chest pain.  Gastrointestinal: Positive for abdominal pain and nausea.  Genitourinary: Negative for dysuria.  Musculoskeletal: Negative for back pain.  Skin: Negative for rash.  Neurological: Negative for headaches.    Hematological: Bruises/bleeds easily.  Psychiatric/Behavioral: Negative for confusion.     Physical Exam Updated Vital Signs BP 106/68   Pulse (!) 52   Resp 25   Ht 5\' 5"  (1.651 m)   Wt 78.9 kg   SpO2 95%   BMI 28.96 kg/m   Physical Exam  Constitutional: She appears well-developed and well-nourished. No distress.  HENT:  Head: Normocephalic and atraumatic.  Mouth/Throat: Oropharynx is clear and moist.  Eyes: Conjunctivae and EOM are normal. Pupils are equal, round, and reactive to light.  Neck: Normal range of motion. Neck supple.  Cardiovascular:  Normal rate and irregular rhythm.  Pulmonary/Chest: Effort normal and breath sounds normal.  Abdominal: Soft. Bowel sounds are normal.  Musculoskeletal: She exhibits no edema.  Neurological: She is alert. No cranial nerve deficit or sensory deficit. She exhibits normal muscle tone. Coordination normal.  Skin: Skin is warm.  Nursing note and vitals reviewed.    ED Treatments / Results  Labs (all labs ordered are listed, but only abnormal results are displayed) Labs Reviewed  COMPREHENSIVE METABOLIC PANEL - Abnormal; Notable for the following:       Result Value   Glucose, Bld 116 (*)    BUN 21 (*)    Creatinine, Ser 1.30 (*)    GFR calc non Af Amer 38 (*)    GFR calc Af Amer 44 (*)    All other components within normal limits  CBC - Abnormal; Notable for the following:    RDW 16.6 (*)    All other components within normal limits  URINALYSIS, ROUTINE W REFLEX MICROSCOPIC - Abnormal; Notable for the following:    APPearance HAZY (*)    Protein, ur 30 (*)    Leukocytes, UA MODERATE (*)    Non Squamous Epithelial 0-5 (*)    All other components within normal limits  BRAIN NATRIURETIC PEPTIDE - Abnormal; Notable for the following:    B Natriuretic Peptide 565.0 (*)    All other components within normal limits  LIPASE, BLOOD  TROPONIN I   Results for orders placed or performed during the hospital encounter of  02/08/17  Lipase, blood  Result Value Ref Range   Lipase 21 11 - 51 U/L  Comprehensive metabolic panel  Result Value Ref Range   Sodium 141 135 - 145 mmol/L   Potassium 3.8 3.5 - 5.1 mmol/L   Chloride 107 101 - 111 mmol/L   CO2 23 22 - 32 mmol/L   Glucose, Bld 116 (H) 65 - 99 mg/dL   BUN 21 (H) 6 - 20 mg/dL   Creatinine, Ser 1.61 (H) 0.44 - 1.00 mg/dL   Calcium 9.1 8.9 - 09.6 mg/dL   Total Protein 7.1 6.5 - 8.1 g/dL   Albumin 4.2 3.5 - 5.0 g/dL   AST  34 15 - 41 U/L   ALT 35 14 - 54 U/L   Alkaline Phosphatase 79 38 - 126 U/L   Total Bilirubin 0.6 0.3 - 1.2 mg/dL   GFR calc non Af Amer 38 (L) >60 mL/min   GFR calc Af Amer 44 (L) >60 mL/min   Anion gap 11 5 - 15  CBC  Result Value Ref Range   WBC 8.7 4.0 - 10.5 K/uL   RBC 4.11 3.87 - 5.11 MIL/uL   Hemoglobin 13.3 12.0 - 15.0 g/dL   HCT 16.1 09.6 - 04.5 %   MCV 97.1 78.0 - 100.0 fL   MCH 32.4 26.0 - 34.0 pg   MCHC 33.3 30.0 - 36.0 g/dL   RDW 40.9 (H) 81.1 - 91.4 %   Platelets 191 150 - 400 K/uL  Urinalysis, Routine w reflex microscopic  Result Value Ref Range   Color, Urine YELLOW YELLOW   APPearance HAZY (A) CLEAR   Specific Gravity, Urine 1.012 1.005 - 1.030   pH 5.0 5.0 - 8.0   Glucose, UA NEGATIVE NEGATIVE mg/dL   Hgb urine dipstick NEGATIVE NEGATIVE   Bilirubin Urine NEGATIVE NEGATIVE   Ketones, ur NEGATIVE NEGATIVE mg/dL   Protein, ur 30 (A) NEGATIVE mg/dL   Nitrite NEGATIVE NEGATIVE   Leukocytes, UA MODERATE (A) NEGATIVE   RBC / HPF 0-5 0 - 5 RBC/hpf   WBC, UA 6-30 0 - 5 WBC/hpf   Bacteria, UA NONE SEEN NONE SEEN   Mucous PRESENT    Non Squamous Epithelial 0-5 (A) NONE SEEN  Brain natriuretic peptide  Result Value Ref Range   B Natriuretic Peptide 565.0 (H) 0.0 - 100.0 pg/mL  Troponin I  Result Value Ref Range   Troponin I <0.03 <0.03 ng/mL     EKG  EKG Interpretation  Date/Time:  Monday February 08 2017 14:16:04 EDT Ventricular Rate:  66 PR Interval:    QRS Duration: 83 QT Interval:  442 QTC  Calculation: 464 R Axis:   75 Text Interpretation:  Atrial fibrillation Low voltage, precordial leads Nonspecific T abnormalities, lateral leads No significant change since last tracing Confirmed by Aliciana Ricciardi  MD, Jameer Storie [phone]) on 02/08/2017 2:39:01 PM       Radiology Ct Abdomen Pelvis Wo Contrast  Result Date: 02/08/2017 CLINICAL DATA:  82 year old hypertensive female chronic kidney disease with abdominal pain for 3 days. Initial encounter. EXAM: CT ABDOMEN AND PELVIS WITHOUT CONTRAST TECHNIQUE: Multidetector CT imaging of the abdomen and pelvis was performed following the standard protocol without IV contrast. COMPARISON:  12/08/2016 renal sonogram. 02/08/2017 chest x-ray. No comparison CT. FINDINGS: Lower chest: Small right-sided pleural effusion. Minimal basal atelectasis. Breast implants in place. Prominent cardiomegaly. Calcification aortic valve. Trace pericardial fluid. Hepatobiliary: Taking into account limitation by non contrast imaging, no worrisome hepatic lesion. No calcified gallstones. Pancreas: Taking into account limitation by non contrast imaging, no mass or pancreatic duct dilation. Spleen: Taking into account limitation by non contrast imaging, no mass or enlargement. Adrenals/Urinary Tract: No renal or ureteral obstructing stone or evidence hydronephrosis. Tiny left angio myelolipoma. Taking into account limitation by non contrast imaging, no worrisome renal or adrenal lesion. Mild hyperplasia right adrenal gland. Decompressed urinary bladder. Stomach/Bowel: No primary bowel inflammatory process is noted. The presence of ascites somewhat limits evaluation. No inflammation surrounds the appendix. No free intraperitoneal air. Patulous distal esophagus/small hiatal hernia. Vascular/Lymphatic: Atherosclerotic changes aorta with mild ectasia without aneurysmal dilation. Branch vessel atherosclerotic changes. No adenopathy. Reproductive: Prior hysterectomy. Other: Mild haziness of fat  planes  lower pelvic wall. Musculoskeletal: Prior surgery T9-T11.  Degenerative changes L5-S1. IMPRESSION: Small amount of ascites of indeterminate etiology. As the heart is enlarged, ascites may be related to increased right heart pressure. This limits evaluation for detection of cholecystitis or bowel inflammatory process however, no calcified gallstones are noted nor is there evidence of primary bowel inflammatory process or free intraperitoneal air. Small hiatal hernia. Atherosclerotic changes aorta with mild ectasia without aneurysm. Postsurgical changes lower thoracic spine. Electronically Signed   By: Lacy Duverney M.D.   On: 02/08/2017 18:36   Dg Chest 2 View  Result Date: 02/08/2017 CLINICAL DATA:  Shortness of breath.  Abdominal pain. EXAM: CHEST  2 VIEW COMPARISON:  No recent prior. FINDINGS: Mediastinum hilar structures normal. Severe cardiomegaly. No evidence of overt congestive heart failure. No pleural effusion or pneumothorax. Degenerative changes thoracic spine. IMPRESSION: Severe cardiomegaly.  No evidence of overt congestive heart failure. Electronically Signed   By: Maisie Fus  Register   On: 02/08/2017 16:36    Procedures Procedures (including critical care time)  Medications Ordered in ED Medications  0.9 %  sodium chloride infusion ( Intravenous New Bag/Given 02/08/17 1649)  iopamidol (ISOVUE-300) 61 % injection (not administered)  promethazine (PHENERGAN) injection 12.5 mg (not administered)  ondansetron (ZOFRAN) injection 4 mg (4 mg Intravenous Given 02/08/17 1650)  diphenhydrAMINE (BENADRYL) injection 12.5 mg (12.5 mg Intravenous Given 02/08/17 1706)     Initial Impression / Assessment and Plan / ED Course  I have reviewed the triage vital signs and the nursing notes.  Pertinent labs & imaging results that were available during my care of the patient were reviewed by me and considered in my medical decision making (see chart for details).     Patient presents for generalized  vague abdominal pain for the past 3 days. Patient states she felt bloated. No nausea or vomiting. Generalized weakness. Patient is followed by cardiology for chronic atrial fib is on Coumadin. Patient without any bleeding. No chest pain. Patient is on Cardizem CD.  Workup for the abdominal pain chest x-ray was negative CT scan of the abdomen with some ascites that cause not clear not a large amount. Small amount. Otherwise it was negative.  Physical red no evidence of pulmonary edema. Patient's cardiac monitor showed atrial fib rate controlled.  We'll have patient follow back up with the cardiology. Continue all her current meds.  Patient's abdominal pain was not severe patient never requested the pain medication.  Final Clinical Impressions(s) / ED Diagnoses   Final diagnoses:  Generalized abdominal pain    New Prescriptions New Prescriptions   No medications on file     Vanetta Mulders, MD 02/08/17 1916  Patient experienced itching with the Zofran that we gave her here. So for the nausea will give her prescription for Phenergan. Not ideal for elderly patients. Patient given a dose of Phenergan here prior to discharge tolerated it okay.    Vanetta Mulders, MD 02/08/17 [phone]

## 2017-02-08 NOTE — ED Triage Notes (Signed)
Patient from home by EMS. Patient complains of abdominal pain x 3 days. States bloating feeling. States history of AAA.

## 2017-02-09 ENCOUNTER — Telehealth: Payer: Self-pay | Admitting: Cardiovascular Disease

## 2017-02-09 NOTE — Telephone Encounter (Signed)
Out of it yesterday, acted almost like she was intoxicated.  Did take to [facility] - did not feel like they done anything to help her .  Stated that he did have nurses coming in, came last week.  [facility].   Nurse will call HH to see when they will be out again.     Call placed to Laney Potash with [facility] - they will be going out tomorrow.  Sounds like bubble packing her medications may be very helpful.  Will take a look tomorrow to see what can be done to help.    Call placed back to husband for notification.

## 2017-02-09 NOTE — Telephone Encounter (Signed)
Call placed to husband Greggory Stallion) - home health is coming out tomorrow.  Will look at medications to see what can be done to make this easier for him.  He verbalized understanding.

## 2017-02-09 NOTE — Telephone Encounter (Signed)
noted 

## 2017-02-09 NOTE — Telephone Encounter (Signed)
Called Karen Richardson to clarify her med does, her husband answered the phone, stated " she's half drunk now, " clarified tegretol dosing with him, and also she needs a tegretol level drawn; he states a nurse is coming tomorrow to do her labs.

## 2017-02-09 NOTE — Telephone Encounter (Signed)
Has questions about her medications. Said there is a pile of medication that they have no clue how to take.

## 2017-02-09 NOTE — Telephone Encounter (Signed)
Verner Mould called requesting to speak to nurse in regards to the ER visit that her mother had on 02/08/17 . Please call her.

## 2017-02-10 ENCOUNTER — Other Ambulatory Visit (HOSPITAL_COMMUNITY)
Admission: RE | Admit: 2017-02-10 | Discharge: 2017-02-10 | Disposition: A | Discharge status: Home / Self Care | Payer: Medicare Other | Source: Other Acute Inpatient Hospital | Attending: Family Medicine | Admitting: Family Medicine

## 2017-02-10 ENCOUNTER — Ambulatory Visit (INDEPENDENT_AMBULATORY_CARE_PROVIDER_SITE_OTHER): Payer: Medicare Other | Admitting: *Deleted

## 2017-02-10 ENCOUNTER — Telehealth: Payer: Self-pay | Admitting: Cardiovascular Disease

## 2017-02-10 ENCOUNTER — Telehealth: Payer: Self-pay

## 2017-02-10 DIAGNOSIS — I4891 Unspecified atrial fibrillation: Secondary | ICD-10-CM

## 2017-02-10 DIAGNOSIS — Z5181 Encounter for therapeutic drug level monitoring: Secondary | ICD-10-CM | POA: Insufficient documentation

## 2017-02-10 DIAGNOSIS — Z7901 Long term (current) use of anticoagulants: Secondary | ICD-10-CM

## 2017-02-10 LAB — POCT INR: INR: 3.1

## 2017-02-10 LAB — CARBAMAZEPINE LEVEL, TOTAL: Carbamazepine Lvl: 11 ug/mL (ref 4.0–12.0)

## 2017-02-10 NOTE — Telephone Encounter (Signed)
Karen Richardson -daughter called today in regards to Karen Richardson medications.  She needs to let Karen Richardson's pharmacy know how to pack them. Please call Karen Richardson [phone]. She is requesting not to call Mr. Fiola because he cannot understand.

## 2017-02-11 ENCOUNTER — Telehealth: Payer: Self-pay

## 2017-02-11 ENCOUNTER — Encounter: Payer: Self-pay | Admitting: Family Medicine

## 2017-02-11 NOTE — Telephone Encounter (Signed)
Notified daughter Sedalia Muta) of below.  Suggested that she have some discussions with her father about caring for him.  If he chooses to make note in chart to only give her permission to mange or POA, he would need to let us know that.  Until then, we would continue to contact him & discuss her care.  Explained to her the difficulties in trying to manage care while her being [place] of state.  Suggested that she or other family member come to office visits with her so that they can stay in the loop with her care.  Also, suggested they remain in contact with [facility] as they are very skilled at helping patients manage their medications.  She verbalized understanding.

## 2017-02-11 NOTE — Telephone Encounter (Signed)
Returned call to patient to follow up on call yesterday.  Home health did come out yesterday.  HH called [facility] and did get her medications straight for her & is supposed to pick up new pack today.  Discussed with him about the daughter calling & he stated he knew she was just trying to help, but he understood just fine.

## 2017-02-11 NOTE — Telephone Encounter (Signed)
Called [facility] and spoke to her husband. Aware of her lab results, and to remain on same dose of tegretol. He states she is feeling better today, and was napping.

## 2017-02-11 NOTE — Telephone Encounter (Signed)
-----   Message from Eustace Moore, MD sent at 02/11/2017  8:36 AM EDT ----- Her blood level is good.  Stay on the same dose of tegretol.

## 2017-02-11 NOTE — Telephone Encounter (Signed)
Error

## 2017-02-12 ENCOUNTER — Other Ambulatory Visit: Payer: Self-pay

## 2017-02-12 ENCOUNTER — Ambulatory Visit (INDEPENDENT_AMBULATORY_CARE_PROVIDER_SITE_OTHER): Payer: Medicare Other | Admitting: Family Medicine

## 2017-02-12 ENCOUNTER — Encounter: Payer: Self-pay | Admitting: *Deleted

## 2017-02-12 ENCOUNTER — Telehealth: Payer: Self-pay | Admitting: Cardiovascular Disease

## 2017-02-12 ENCOUNTER — Encounter: Payer: Self-pay | Admitting: Family Medicine

## 2017-02-12 VITALS — BP 122/84 | HR 90 | Temp 98.2°F | Resp 20 | Ht 65.0 in | Wt 179.1 lb

## 2017-02-12 DIAGNOSIS — E8779 Other fluid overload: Secondary | ICD-10-CM | POA: Diagnosis not present

## 2017-02-12 DIAGNOSIS — I4891 Unspecified atrial fibrillation: Secondary | ICD-10-CM

## 2017-02-12 NOTE — Patient Instructions (Addendum)
Increase lasix to twice a day for 3 days- morning and early afternoon ( 6 hours apart) Don't take second dose at bed See Dr Kirtland Bouchard early next week His office will call you Go to ER if feeling worse

## 2017-02-12 NOTE — Progress Notes (Signed)
Chief Complaint  Patient presents with  . Shortness of Breath  . Fatigue   ER follow up Went on 3/12 for not feeling well, abdominal fullness and pain, feeling short of breath, weak , tired. She had an EKG - unchanged, CXR - unchanged with cardiomegaly, and labs that showed BNP elevated moderately to 565, negative troponins.  her tegretol level checked this week is therapeutic.  Her CT abdomen showed small amount ascites. Here with husband.  Is eating less and gaining more, predominately in girth.  Not much pedal edema. She is compliant with medicines.  They come in bubble paks from the pharmacy for ease of use. There is mention in a telephone message that husband said she was too drunk to come to the phone, he clarifies that she does NOT drink alcohol, but that she was weak and acting "like a drunk". No chest pain or pressure.  No sensation of palpitations.  No presyncope or orthostatic changes.  She cannot lie down to sleep without feeling "smothered"  Patient Active Problem List   Diagnosis Date Noted  . Esophageal dysphagia 02/03/2017  . Dizziness 01/07/2017  . Monitoring for long-term anticoagulant use 12/18/2016  . CKD (chronic kidney disease), stage III 12/12/2016  . Seizure disorder (HCC) 12/12/2016  . Presbyesophagus 12/11/2016  . History of esophageal stricture 12/10/2016  . Dysphagia   . AKI (acute kidney injury) (HCC)   . Atrial fibrillation with RVR (HCC) 12/01/2016  . Pulmonary emboli (HCC) 12/01/2016  . GERD (gastroesophageal reflux disease) 12/01/2016  . Arthralgia of both knees 01/11/2016  . Hypertension 09/22/2011    Outpatient Encounter Prescriptions as of 02/12/2017  Medication Sig  . atorvastatin (LIPITOR) 10 MG tablet Take 10 mg by mouth daily.  . carbamazepine (TEGRETOL XR) 100 MG 12 hr tablet Take 1 tablet (100 mg total) by mouth 2 (two) times daily.  Marland Kitchen diltiazem (CARDIZEM CD) 240 MG 24 hr capsule Take 2 capsules (480 mg total) by mouth daily.  .  furosemide (LASIX) 20 MG tablet Take 20 mg by mouth daily.  . metoprolol succinate (TOPROL-XL) 100 MG 24 hr tablet Take 1 tablet twice daily (Patient taking differently: Take 100 mg by mouth 2 (two) times daily. Take 1 tablet twice daily)  . pantoprazole (PROTONIX) 40 MG tablet Take 40 mg by mouth daily.   . potassium chloride (K-DUR,KLOR-CON) 10 MEQ tablet Take 1 tablet (10 mEq total) by mouth 2 (two) times daily.  . promethazine (PHENERGAN) 25 MG tablet Take 1 tablet (25 mg total) by mouth every 6 (six) hours as needed.  . warfarin (COUMADIN) 2.5 MG tablet Take 2 tablets (5 mg total) by mouth daily at 6 PM.   No facility-administered encounter medications on file as of 02/12/2017.     Allergies  Allergen Reactions  . Codeine Nausea Only    Review of Systems  Constitutional: Positive for activity change, appetite change, fatigue and unexpected weight change. Negative for chills and fever.  HENT: Positive for trouble swallowing. Negative for congestion and dental problem.   Eyes: Negative for photophobia and visual disturbance.  Respiratory: Negative for cough and chest tightness.   Cardiovascular: Negative for chest pain, palpitations and leg swelling.  Gastrointestinal: Positive for abdominal distention and abdominal pain.  Genitourinary: Negative for difficulty urinating and frequency.  Musculoskeletal: Negative for back pain and gait problem.  Neurological: Positive for weakness and light-headedness.  Psychiatric/Behavioral: Positive for sleep disturbance.  see HPI   BP 122/84 (BP Location: Left Arm, Patient Position:  Sitting, Cuff Size: Normal)   Pulse 90   Temp 98.2 F (36.8 C) (Temporal)   Resp 20   Ht 5\' 5"  (1.651 m)   Wt 179 lb 1.3 oz (81.2 kg)   SpO2 96%   BMI 29.80 kg/m   Physical Exam  Constitutional: She is oriented to person, place, and time. She appears well-developed and well-nourished. She appears distressed.  Frustrated, tired, unhappy  HENT:  Head:  Normocephalic and atraumatic.  Mouth/Throat: Oropharynx is clear and moist.  Eyes: Conjunctivae are normal. Pupils are equal, round, and reactive to light.  Neck: Normal range of motion. No JVD present.  Cardiovascular: Normal rate.   irreg  Pulmonary/Chest: Effort normal. She has rales.  Few bibasilar rales  Abdominal: Soft. Bowel sounds are normal. She exhibits no distension. There is no tenderness.  Musculoskeletal: Normal range of motion. She exhibits no edema.  Lymphadenopathy:    She has no cervical adenopathy.  Neurological: She is alert and oriented to person, place, and time.  Psychiatric: She has a normal mood and affect. Her behavior is normal.  EKG - rate 90 - a fib - unchanged ASSESSMENT/PLAN:  1. Atrial fibrillation with RVR (HCC)   2. Other hypervolemia  Called cardiology office and spoke to Dr Junius Argyle PA.  She was kind enough to listen and offer advice.  We will diurese over weekend.  See Dr Kirtland Bouchard next week,.  Go to ER if worse.   Patient Instructions  Increase lasix to twice a day for 3 days- morning and early afternoon ( 6 hours apart) Don't take second dose at bed See Dr Kirtland Bouchard early next week His office will call you Go to ER if feeling worse   Eustace Moore, MD

## 2017-02-12 NOTE — Telephone Encounter (Signed)
Pt's husband called very concerned for the pt, she's not doing good and he would like to speak w/ someone.

## 2017-02-12 NOTE — Telephone Encounter (Signed)
Attempt to reach patient twice,line busy-cc

## 2017-02-12 NOTE — Telephone Encounter (Signed)
This encounter was created in error - please disregard.

## 2017-02-12 NOTE — Telephone Encounter (Signed)
Per Leda Gauze PA-C, she spoke with pcp and they increased pt's lasix and made her a f/u apt with Dr Purvis Sheffield for Wednesday 3/21 at the [facility] office

## 2017-02-15 ENCOUNTER — Telehealth: Payer: Self-pay

## 2017-02-15 NOTE — Telephone Encounter (Signed)
I have attempted to call the [facility], no answer, no answering machine.

## 2017-02-15 NOTE — Telephone Encounter (Signed)
-----   Message from Eustace Moore, MD sent at 02/15/2017  8:49 AM EDT ----- Please call Mr or Mrs Pallan and see how she did over the weekend

## 2017-02-15 NOTE — Telephone Encounter (Signed)
Husband called back to let us know Karen Richardson had a temp of 99.4 today

## 2017-02-15 NOTE — Telephone Encounter (Signed)
Called again, spoke to husband. States sibyl has been in bed all weekend, is eating and drinking, but has no energy and is having abd discomfort. Also said his daughter Sedalia Muta is coming from [employer].

## 2017-02-17 ENCOUNTER — Telehealth: Payer: Self-pay | Admitting: *Deleted

## 2017-02-17 ENCOUNTER — Telehealth: Payer: Self-pay | Admitting: Cardiovascular Disease

## 2017-02-17 ENCOUNTER — Ambulatory Visit (INDEPENDENT_AMBULATORY_CARE_PROVIDER_SITE_OTHER): Payer: Medicare Other | Admitting: *Deleted

## 2017-02-17 ENCOUNTER — Encounter: Payer: Self-pay | Admitting: Cardiovascular Disease

## 2017-02-17 ENCOUNTER — Ambulatory Visit (INDEPENDENT_AMBULATORY_CARE_PROVIDER_SITE_OTHER): Payer: Medicare Other | Admitting: Cardiovascular Disease

## 2017-02-17 VITALS — BP 120/84 | HR 90 | Ht 65.0 in | Wt 173.0 lb

## 2017-02-17 DIAGNOSIS — I4891 Unspecified atrial fibrillation: Secondary | ICD-10-CM

## 2017-02-17 DIAGNOSIS — Z5181 Encounter for therapeutic drug level monitoring: Secondary | ICD-10-CM

## 2017-02-17 DIAGNOSIS — I5032 Chronic diastolic (congestive) heart failure: Secondary | ICD-10-CM | POA: Diagnosis not present

## 2017-02-17 DIAGNOSIS — I2699 Other pulmonary embolism without acute cor pulmonale: Secondary | ICD-10-CM

## 2017-02-17 DIAGNOSIS — I10 Essential (primary) hypertension: Secondary | ICD-10-CM

## 2017-02-17 DIAGNOSIS — Z9289 Personal history of other medical treatment: Secondary | ICD-10-CM

## 2017-02-17 DIAGNOSIS — I517 Cardiomegaly: Secondary | ICD-10-CM | POA: Diagnosis not present

## 2017-02-17 DIAGNOSIS — Z7901 Long term (current) use of anticoagulants: Secondary | ICD-10-CM

## 2017-02-17 LAB — POCT INR: INR: 1.9

## 2017-02-17 MED ORDER — FUROSEMIDE 40 MG PO TABS: 40.0000 mg | ORAL_TABLET | Freq: Every day | ORAL | Status: DC

## 2017-02-17 NOTE — Telephone Encounter (Signed)
Done.  See coumadin note. 

## 2017-02-17 NOTE — Progress Notes (Signed)
SUBJECTIVE: The patient presents for follow-up of atrial fibrillation and diastolic heart failure. She is currently taking long-acting diltiazem 480 mg daily and Toprol-XL 100 mg twice daily.  She was reportedly seen in the ED on 02/08/17 but there are no physician notes in Epic. There were labs and studies. Urinalysis showed moderate leukocytes. BUN 21, creatinine 1.3. White blood cells, hemoglobin, and platelets were normal. BNP elevated at 565. Troponin was normal. Chest x-ray showed "severe cardiomegaly with no evidence of overt congestive heart failure ". CT of the abdomen showed a small amount of ascites.  She is here with her daughter from [place], [place]. Her daughter has several questions regarding her mother's medications.  When I saw her in February she reported taking Lasix 40 mg daily but her daughter tells me she was only taking 20 mg. She said her dose was recently increased to 40 mg and the patient is feeling better.  ECG 3/12 showed atrial fibrillation, heart rate 66 bpm. ECG 3/16 showed atrial fibrillation heart rate 90 bpm with PVCs and nonspecific T-wave abnormalities.  Echocardiogram 11/30/16 performed at [facility] rocking him showed normal left ventricular systolic function, LVEF 60-65%, severe mitral and tricuspid regurgitation, and severe biatrial enlargement.   Review of Systems: As per "subjective", otherwise negative.  Allergies  Allergen Reactions  . Codeine Nausea Only    Current Outpatient Prescriptions  Medication Sig Dispense Refill  . atorvastatin (LIPITOR) 10 MG tablet Take 10 mg by mouth daily.    . carbamazepine (TEGRETOL XR) 100 MG 12 hr tablet Take 1 tablet (100 mg total) by mouth 2 (two) times daily. 60 tablet 1  . diltiazem (CARDIZEM CD) 240 MG 24 hr capsule Take 2 capsules (480 mg total) by mouth daily. 60 capsule 3  . furosemide (LASIX) 20 MG tablet Take 40 mg by mouth daily.     Marland Kitchen guaiFENesin (MUCINEX) 600 MG 12 hr tablet Take 600 mg by mouth 2  (two) times daily.    . metoprolol succinate (TOPROL-XL) 100 MG 24 hr tablet Take 1 tablet twice daily (Patient taking differently: Take 100 mg by mouth 2 (two) times daily. Take 1 tablet twice daily) 60 tablet 3  . pantoprazole (PROTONIX) 40 MG tablet Take 40 mg by mouth daily.   4  . potassium chloride (K-DUR,KLOR-CON) 10 MEQ tablet Take 1 tablet (10 mEq total) by mouth 2 (two) times daily. 30 tablet 2  . promethazine (PHENERGAN) 25 MG tablet Take 1 tablet (25 mg total) by mouth every 6 (six) hours as needed. 12 tablet 1  . warfarin (COUMADIN) 2.5 MG tablet Take 2 tablets (5 mg total) by mouth daily at 6 PM. 60 tablet 5   No current facility-administered medications for this visit.     Past Medical History:  Diagnosis Date  . Anxiety   . Atrial fibrillation with RVR (HCC) 12/01/2016  . CKD (chronic kidney disease), stage III 12/12/2016  . Depression   . Dysphagia   . GERD (gastroesophageal reflux disease) 12/01/2016  . Heart murmur   . History of esophageal stricture 12/10/2016  . Hyperlipidemia   . Hypertension    x 4 months  . Presbyesophagus 12/11/2016  . Pulmonary emboli (HCC) 12/01/2016  . Seizures (HCC)   . Stroke [facility])     Past Surgical History:  Procedure Laterality Date  . ABDOMINAL HYSTERECTOMY    . BREAST ENHANCEMENT SURGERY     40 yrs ago  . PARTIAL HYSTERECTOMY    . Tumor removed  from spinal cord     benign    Social History   Social History  . Marital status: Married    Spouse name: george  . Number of children: 4  . Years of education: 12   Occupational History  . retired     Neurosurgeon   Social History Main Topics  . Smoking status: Never Smoker  . Smokeless tobacco: Never Used  . Alcohol use No  . Drug use: No  . Sexual activity: Not Currently   Other Topics Concern  . Not on file   Social History Narrative   Lives with Greggory Stallion   many pets     Vitals:   02/17/17 1427  BP: 120/84  Pulse: 90  SpO2: 92%  Weight: 173 lb (78.5 kg)  Height:  5\' 5"  (1.651 m)    PHYSICAL EXAM General: NAD HEENT: Normal. Neck: No JVD, no thyromegaly. Lungs: No rales, faint intermittent expiratory wheezes. CV: Nondisplaced PMI.  Regular rate and irregular rhythm, normal S1/S2, no S3, no murmur. No pretibial or periankle edema.  Abdomen: Soft, nontender, no distention.  Neurologic: Alert and oriented.  Psych: Normal affect. Skin: Normal. Musculoskeletal: No gross deformities.    ECG: Most recent ECG reviewed.      ASSESSMENT AND PLAN:  1. Rapid atrial fibrillation:HR is controlled. Currently taking long-acting diltiazem 480 mg daily and Toprol-XL 100 mg twice daily for rate control. She is anticoagulated with warfarin. INR: 3.1 on 02/10/17.  2. Bilateral pulmonary emboli:Anticoagulated with warfarin. INR: 3.1 on 02/10/17.  3. Chronic diastolic heart failure: Euvolemic on Lasix 40 mg daily.  4. Hypertension: Controlled. No changes.  5. Cardiomegaly: Not seen by echocardiogram on 11/30/16 but reported on both chest x-ray and CT abdomen as reviewed above. Will obtain an echocardiogram to evaluate cardiac structure and function.  Dispo: fu 3 months.  Time spent: 40 minutes, of which greater than 50% was spent reviewing symptoms, relevant blood tests and studies, and discussing management plan with the patient.   Prentice Docker, M.D., F.A.C.C.

## 2017-02-17 NOTE — Patient Instructions (Addendum)
Medication Instructions:  Continue Lasix at 40mg  daily.  Continue all other current medications.  Labwork: none  Testing/Procedures:  Your physician has requested that you have an echocardiogram. Echocardiography is a painless test that uses sound waves to create images of your heart. It provides your doctor with information about the size and shape of your heart and how well your heart's chambers and valves are working. This procedure takes approximately one hour. There are no restrictions for this procedure.  Office will contact with results via phone or letter.    Follow-Up: 3 months   Any Other Special Instructions Will Be Listed Below (If Applicable).  If you need a refill on your cardiac medications before your next appointment, please call your pharmacy.

## 2017-02-17 NOTE — Telephone Encounter (Signed)
Pre-cert Verification for the following procedure   ECHO scheduled for 02/22/17 at [facility]

## 2017-02-17 NOTE — Telephone Encounter (Signed)
INR 1.9 per Lutha w/ [facility] [phone]

## 2017-02-19 ENCOUNTER — Emergency Department (HOSPITAL_COMMUNITY)
Admission: EM | Admit: 2017-02-19 | Discharge: 2017-02-20 | Disposition: A | Discharge status: Home / Self Care | Payer: Medicare Other | Attending: Emergency Medicine | Admitting: Emergency Medicine

## 2017-02-19 ENCOUNTER — Encounter (HOSPITAL_COMMUNITY): Payer: Self-pay | Admitting: *Deleted

## 2017-02-19 ENCOUNTER — Emergency Department (HOSPITAL_COMMUNITY): Payer: Medicare Other

## 2017-02-19 DIAGNOSIS — N183 Chronic kidney disease, stage 3 (moderate): Secondary | ICD-10-CM | POA: Insufficient documentation

## 2017-02-19 DIAGNOSIS — Z79899 Other long term (current) drug therapy: Secondary | ICD-10-CM | POA: Insufficient documentation

## 2017-02-19 DIAGNOSIS — Z7901 Long term (current) use of anticoagulants: Secondary | ICD-10-CM | POA: Diagnosis not present

## 2017-02-19 DIAGNOSIS — J101 Influenza due to other identified influenza virus with other respiratory manifestations: Secondary | ICD-10-CM | POA: Diagnosis not present

## 2017-02-19 DIAGNOSIS — I129 Hypertensive chronic kidney disease with stage 1 through stage 4 chronic kidney disease, or unspecified chronic kidney disease: Secondary | ICD-10-CM | POA: Diagnosis not present

## 2017-02-19 DIAGNOSIS — N3000 Acute cystitis without hematuria: Secondary | ICD-10-CM | POA: Insufficient documentation

## 2017-02-19 DIAGNOSIS — R05 Cough: Secondary | ICD-10-CM | POA: Diagnosis present

## 2017-02-19 LAB — BASIC METABOLIC PANEL
ANION GAP: 10 (ref 5–15)
BUN: 23 mg/dL — AB (ref 6–20)
CO2: 23 mmol/L (ref 22–32)
Calcium: 7.8 mg/dL — ABNORMAL LOW (ref 8.9–10.3)
Chloride: 105 mmol/L (ref 101–111)
Creatinine, Ser: 1.45 mg/dL — ABNORMAL HIGH (ref 0.44–1.00)
GFR calc Af Amer: 38 mL/min — ABNORMAL LOW (ref >60)
GFR, EST NON AFRICAN AMERICAN: 33 mL/min — AB (ref >60)
Glucose, Bld: 113 mg/dL — ABNORMAL HIGH (ref 65–99)
POTASSIUM: 3.8 mmol/L (ref 3.5–5.1)
SODIUM: 138 mmol/L (ref 135–145)

## 2017-02-19 LAB — HEPATIC FUNCTION PANEL
ALT: 51 U/L (ref 14–54)
AST: 69 U/L — ABNORMAL HIGH (ref 15–41)
Albumin: 3.6 g/dL (ref 3.5–5.0)
Alkaline Phosphatase: 80 U/L (ref 38–126)
BILIRUBIN INDIRECT: 0.4 mg/dL (ref 0.3–0.9)
Bilirubin, Direct: 0.2 mg/dL (ref 0.1–0.5)
TOTAL PROTEIN: 6.5 g/dL (ref 6.5–8.1)
Total Bilirubin: 0.6 mg/dL (ref 0.3–1.2)

## 2017-02-19 LAB — I-STAT CG4 LACTIC ACID, ED: Lactic Acid, Venous: 1.59 mmol/L (ref 0.5–1.9)

## 2017-02-19 LAB — CBC WITH DIFFERENTIAL/PLATELET
BASOS ABS: 0 10*3/uL (ref 0.0–0.1)
BASOS PCT: 0 %
EOS ABS: 0 10*3/uL (ref 0.0–0.7)
EOS PCT: 0 %
HEMATOCRIT: 39.3 % (ref 36.0–46.0)
Hemoglobin: 13.3 g/dL (ref 12.0–15.0)
Lymphocytes Relative: 22 %
Lymphs Abs: 1.7 10*3/uL (ref 0.7–4.0)
MCH: 32 pg (ref 26.0–34.0)
MCHC: 33.8 g/dL (ref 30.0–36.0)
MCV: 94.7 fL (ref 78.0–100.0)
MONO ABS: 0.9 10*3/uL (ref 0.1–1.0)
MONOS PCT: 11 %
NEUTROS ABS: 5.2 10*3/uL (ref 1.7–7.7)
Neutrophils Relative %: 67 %
PLATELETS: 164 10*3/uL (ref 150–400)
RBC: 4.15 MIL/uL (ref 3.87–5.11)
RDW: 16.2 % — AB (ref 11.5–15.5)
WBC: 7.8 10*3/uL (ref 4.0–10.5)

## 2017-02-19 LAB — URINALYSIS, ROUTINE W REFLEX MICROSCOPIC
Glucose, UA: NEGATIVE mg/dL
Hgb urine dipstick: NEGATIVE
Ketones, ur: NEGATIVE mg/dL
LEUKOCYTES UA: NEGATIVE
Nitrite: NEGATIVE
SQUAMOUS EPITHELIAL / LPF: NONE SEEN
Specific Gravity, Urine: 1.023 (ref 1.005–1.030)
pH: 5 (ref 5.0–8.0)

## 2017-02-19 LAB — INFLUENZA PANEL BY PCR (TYPE A & B)
INFLBPCR: NEGATIVE
Influenza A By PCR: POSITIVE — AB

## 2017-02-19 LAB — TROPONIN I: Troponin I: <0.03 ng/mL (ref <0.03)

## 2017-02-19 MED ORDER — SODIUM CHLORIDE 0.9 % IV BOLUS (SEPSIS)
2000.0000 mL | Freq: Once | INTRAVENOUS | Status: AC
  Administered 2017-02-19: 2000 mL via INTRAVENOUS

## 2017-02-19 MED ORDER — OSELTAMIVIR PHOSPHATE 75 MG PO CAPS
75.0000 mg | ORAL_CAPSULE | Freq: Two times a day (BID) | ORAL | 0 refills | Status: DC
Start: 2017-02-19 — End: 2017-03-17

## 2017-02-19 MED ORDER — CEPHALEXIN 500 MG PO CAPS
500.0000 mg | ORAL_CAPSULE | Freq: Once | ORAL | Status: AC
  Administered 2017-02-19: 500 mg via ORAL
  Filled 2017-02-19: qty 1

## 2017-02-19 MED ORDER — CEPHALEXIN 500 MG PO CAPS: 500.0000 mg | ORAL_CAPSULE | Freq: Four times a day (QID) | ORAL | 0 refills | Status: DC

## 2017-02-19 MED ORDER — OSELTAMIVIR PHOSPHATE 75 MG PO CAPS
75.0000 mg | ORAL_CAPSULE | Freq: Once | ORAL | Status: AC
  Administered 2017-02-19: 75 mg via ORAL
  Filled 2017-02-19: qty 1

## 2017-02-19 NOTE — ED Notes (Signed)
Pt and family updated, pt has wheezing noted, Dr Estell Harpin notified,

## 2017-02-19 NOTE — ED Provider Notes (Signed)
[facility]-EMERGENCY DEPT Provider Note   CSN: [id number] Arrival date & time: 02/19/17  2038   By signing my name below, I, Clovis Pu, attest that this documentation has been prepared under the direction and in the presence of Bethann Berkshire, MD  Electronically Signed: Clovis Pu, ED Scribe. 02/19/17. 9:01 PM.   History   Chief Complaint Chief Complaint  Patient presents with  . Hypotension   HPI Comments:  Karen Richardson is a 83 y.o. female, with a PMHx of CKD, HLD, HTN, PE and stroke, who presents to the Emergency Department, via EMS, complaining of persistent chills x today. She also reports a cough x 1 week and a slight decrease in appetite. Per triage note, the pt's blood pressure has been running low and was 90/50 on home meter. No alleviating or modifying factors noted. Pt denies nausea, vomiting and any other associated symptoms. No other complaints noted.   Patient complains of weakness and some cough.   The history is provided by the patient. No language interpreter was used.  Illness  This is a new problem. The current episode started 12 to 24 hours ago. The problem occurs constantly. The problem has not changed since onset.Pertinent negatives include no chest pain, no abdominal pain and no headaches. Nothing aggravates the symptoms. Nothing relieves the symptoms.    Past Medical History:  Diagnosis Date  . Anxiety   . Atrial fibrillation with RVR (HCC) 12/01/2016  . CKD (chronic kidney disease), stage III 12/12/2016  . Depression   . Dysphagia   . GERD (gastroesophageal reflux disease) 12/01/2016  . Heart murmur   . History of esophageal stricture 12/10/2016  . Hyperlipidemia   . Hypertension    x 4 months  . Presbyesophagus 12/11/2016  . Pulmonary emboli (HCC) 12/01/2016  . Seizures (HCC)   . Stroke [facility])     Patient Active Problem List   Diagnosis Date Noted  . Esophageal dysphagia 02/03/2017  . Dizziness 01/07/2017  . Monitoring for long-term anticoagulant use  12/18/2016  . CKD (chronic kidney disease), stage III 12/12/2016  . Seizure disorder (HCC) 12/12/2016  . Presbyesophagus 12/11/2016  . History of esophageal stricture 12/10/2016  . Dysphagia   . AKI (acute kidney injury) (HCC)   . Atrial fibrillation with RVR (HCC) 12/01/2016  . Pulmonary emboli (HCC) 12/01/2016  . GERD (gastroesophageal reflux disease) 12/01/2016  . Arthralgia of both knees 01/11/2016  . Hypertension 09/22/2011    Past Surgical History:  Procedure Laterality Date  . ABDOMINAL HYSTERECTOMY    . BREAST ENHANCEMENT SURGERY     40 yrs ago  . PARTIAL HYSTERECTOMY    . Tumor removed from spinal cord     benign    OB History    No data available       Home Medications    Prior to Admission medications   Medication Sig Start Date End Date Taking? Authorizing Provider  atorvastatin (LIPITOR) 10 MG tablet Take 10 mg by mouth daily.    Historical Provider, MD  carbamazepine (TEGRETOL XR) 100 MG 12 hr tablet Take 1 tablet (100 mg total) by mouth 2 (two) times daily. 01/14/17   Eustace Moore, MD  diltiazem (CARDIZEM CD) 240 MG 24 hr capsule Take 2 capsules (480 mg total) by mouth daily. 01/05/17 04/05/17  Laqueta Linden, MD  furosemide (LASIX) 40 MG tablet Take 1 tablet (40 mg total) by mouth daily. 02/17/17   Laqueta Linden, MD  guaiFENesin (MUCINEX) 600 MG 12 hr tablet  Take 600 mg by mouth 2 (two) times daily.    Historical Provider, MD  metoprolol succinate (TOPROL-XL) 100 MG 24 hr tablet Take 1 tablet twice daily Patient taking differently: Take 100 mg by mouth 2 (two) times daily. Take 1 tablet twice daily 01/25/17   Laqueta Linden, MD  pantoprazole (PROTONIX) 40 MG tablet Take 40 mg by mouth daily.  12/19/15   Historical Provider, MD  potassium chloride (K-DUR,KLOR-CON) 10 MEQ tablet Take 1 tablet (10 mEq total) by mouth 2 (two) times daily. 12/14/16   Maryruth Bun Rama, MD  promethazine (PHENERGAN) 25 MG tablet Take 1 tablet (25 mg total) by mouth every  6 (six) hours as needed. 02/08/17   Vanetta Mulders, MD  warfarin (COUMADIN) 2.5 MG tablet Take 2 tablets (5 mg total) by mouth daily at 6 PM. 01/11/17   Eustace Moore, MD    Family History Family History  Problem Relation Age of Onset  . Pulmonary embolism Mother   . Cancer Mother     mass in colon  . Stroke Father   . Alcohol abuse Father     Social History Social History  Substance Use Topics  . Smoking status: Never Smoker  . Smokeless tobacco: Never Used  . Alcohol use No     Allergies   Codeine   Review of Systems Review of Systems  Constitutional: Positive for appetite change and chills. Negative for fatigue and fever.  HENT: Negative for congestion, ear discharge and sinus pressure.   Eyes: Negative for discharge.  Respiratory: Positive for cough and wheezing.   Cardiovascular: Negative for chest pain.  Gastrointestinal: Negative for abdominal pain and diarrhea.  Genitourinary: Negative for frequency and hematuria.  Musculoskeletal: Negative for back pain.  Skin: Negative for rash.  Neurological: Negative for seizures and headaches.  Psychiatric/Behavioral: Negative for hallucinations.    Physical Exam Updated Vital Signs BP 113/70   Pulse 68   Temp 97.3 F (36.3 C) (Oral)   Resp 20   Ht 5' 5.5" (1.664 m)   Wt 173 lb (78.5 kg)   SpO2 96%   BMI 28.35 kg/m   Physical Exam  Constitutional: She is oriented to person, place, and time. She appears well-developed.  HENT:  Head: Normocephalic.  Mouth/Throat: Mucous membranes are dry.  Eyes: Conjunctivae and EOM are normal. No scleral icterus.  Neck: Neck supple. No thyromegaly present.  Cardiovascular: Normal rate and regular rhythm.  Exam reveals no gallop and no friction rub.   No murmur heard. Pulmonary/Chest: No stridor. She has wheezes. She has no rales. She exhibits no tenderness.  Minimal wheezing bilaterally.   Abdominal: She exhibits no distension. There is no tenderness. There is no  rebound.  Musculoskeletal: Normal range of motion. She exhibits no edema.  Lymphadenopathy:    She has no cervical adenopathy.  Neurological: She is oriented to person, place, and time. She exhibits normal muscle tone. Coordination normal.  Skin: No rash noted. No erythema.  Psychiatric: She has a normal mood and affect. Her behavior is normal.  Nursing note and vitals reviewed.    ED Treatments / Results  DIAGNOSTIC STUDIES:  Oxygen Saturation is 96% on RA, normal by my interpretation.    COORDINATION OF CARE:  8:57 PM Discussed treatment plan with pt at bedside and pt agreed to plan.  Labs (all labs ordered are listed, but only abnormal results are displayed) Labs Reviewed  CBC WITH DIFFERENTIAL/PLATELET  BASIC METABOLIC PANEL  TROPONIN I  I-STAT CG4 LACTIC  ACID, ED    EKG  EKG Interpretation None       Radiology No results found.  Procedures Procedures (including critical care time)  Medications Ordered in ED Medications - No data to display   Initial Impression / Assessment and Plan / ED Course  I have reviewed the triage vital signs and the nursing notes.  Pertinent labs & imaging results that were available during my care of the patient were reviewed by me and considered in my medical decision making (see chart for details).     Patient with influenza urinary tract infection and dehydration. Patient feels better with fluids. She'll be sent home on Keflex and Tamiflu Final Clinical Impressions(s) / ED Diagnoses   Final diagnoses:  None    New Prescriptions New Prescriptions   No medications on file  The chart was scribed for me under my direct supervision.  I personally performed the history, physical, and medical decision making and all procedures in the evaluation of this patient.Bethann Berkshire, MD 02/19/17 2330

## 2017-02-19 NOTE — Discharge Instructions (Signed)
Drink plenty of fluids.  Take Tylenol for fever and aches.  Follow-up with your family doctor next week °

## 2017-02-19 NOTE — ED Notes (Signed)
Pt returned from xray,  

## 2017-02-19 NOTE — ED Notes (Signed)
Patient transported to X-ray 

## 2017-02-19 NOTE — ED Triage Notes (Signed)
Pt's family called ems d/t pt not feeling well for several days and her blood pressure at home has been low 90/50 on home meter.  Pt denies specific complaints

## 2017-02-20 NOTE — ED Notes (Signed)
Pt arrived to er by [facility] with c/o generalized weakness, sob, pt states that she has been sob and weak for the past 3 months, states that she had a fever a few nights ago, diarrhea during the night last night,

## 2017-02-21 LAB — URINE CULTURE: CULTURE: NO GROWTH

## 2017-02-22 ENCOUNTER — Ambulatory Visit: Payer: Medicare Other | Admitting: Cardiovascular Disease

## 2017-02-22 ENCOUNTER — Ambulatory Visit (HOSPITAL_COMMUNITY)
Admission: RE | Admit: 2017-02-22 | Discharge: 2017-02-22 | Disposition: A | Discharge status: Home / Self Care | Payer: Medicare Other | Source: Ambulatory Visit | Attending: Cardiovascular Disease | Admitting: Cardiovascular Disease

## 2017-02-22 DIAGNOSIS — I351 Nonrheumatic aortic (valve) insufficiency: Secondary | ICD-10-CM | POA: Diagnosis not present

## 2017-02-22 DIAGNOSIS — I517 Cardiomegaly: Secondary | ICD-10-CM | POA: Diagnosis not present

## 2017-02-22 DIAGNOSIS — I340 Nonrheumatic mitral (valve) insufficiency: Secondary | ICD-10-CM | POA: Diagnosis not present

## 2017-02-22 DIAGNOSIS — I119 Hypertensive heart disease without heart failure: Secondary | ICD-10-CM | POA: Insufficient documentation

## 2017-02-22 DIAGNOSIS — I361 Nonrheumatic tricuspid (valve) insufficiency: Secondary | ICD-10-CM | POA: Insufficient documentation

## 2017-02-22 NOTE — Progress Notes (Signed)
*  PRELIMINARY RESULTS* Echocardiogram 2D Echocardiogram has been performed.  Karen Richardson 02/22/2017, 1:30 PM

## 2017-02-23 ENCOUNTER — Encounter: Payer: Self-pay | Admitting: Cardiovascular Disease

## 2017-02-23 ENCOUNTER — Telehealth (INDEPENDENT_AMBULATORY_CARE_PROVIDER_SITE_OTHER): Payer: Self-pay | Admitting: *Deleted

## 2017-02-23 ENCOUNTER — Ambulatory Visit: Payer: Medicare Other | Admitting: Family Medicine

## 2017-02-23 ENCOUNTER — Telehealth: Payer: Self-pay

## 2017-02-23 NOTE — Telephone Encounter (Signed)
Husband and [place] have both called. He called with concerns about her being SOB, she then called stating she isnt sleeping, shes anxious, and feels depressed. She would like an appt with you.Marland Kitchen

## 2017-02-23 NOTE — Telephone Encounter (Signed)
My schedule is full tomorrow. See if they can come in for 40 minute visit on Thursday

## 2017-02-23 NOTE — Telephone Encounter (Signed)
Patient is scheduled for EGD/ED on 03/17/17, she is on warfarin and needs to stop is 5 days. I did send message her PCP Dr Delton See as that is who the patient stated handled this when she was in the office. Patient's daughter, Graciella Belton has called and states Dr Kirtland Bouchard handles this -- please advise

## 2017-02-24 ENCOUNTER — Telehealth: Payer: Self-pay | Admitting: *Deleted

## 2017-02-24 ENCOUNTER — Ambulatory Visit (INDEPENDENT_AMBULATORY_CARE_PROVIDER_SITE_OTHER): Payer: Medicare Other | Admitting: *Deleted

## 2017-02-24 DIAGNOSIS — Z5181 Encounter for therapeutic drug level monitoring: Secondary | ICD-10-CM

## 2017-02-24 DIAGNOSIS — I4891 Unspecified atrial fibrillation: Secondary | ICD-10-CM

## 2017-02-24 DIAGNOSIS — Z7901 Long term (current) use of anticoagulants: Secondary | ICD-10-CM

## 2017-02-24 LAB — POCT INR: INR: 3.5

## 2017-02-24 NOTE — Telephone Encounter (Signed)
Please arrange for Lovenox bridge with holding of warfarin prior to procedure. Thank you.

## 2017-02-24 NOTE — Telephone Encounter (Signed)
Ok I will let her daughter know. Thanks

## 2017-02-24 NOTE — Telephone Encounter (Signed)
Called and spoke to pt, appt made for thursday

## 2017-02-24 NOTE — Telephone Encounter (Signed)
Dr Kirtland Bouchard. Please advise. Misty Stanley

## 2017-02-24 NOTE — Telephone Encounter (Signed)
-----   Message from Laqueta Linden, MD sent at 02/22/2017  3:47 PM EDT ----- The RV is enlarged. The main pumping chamber of the heart functions well.

## 2017-02-24 NOTE — Telephone Encounter (Signed)
Ann, I will handle Lovenox bridge for pt when it get closer to time. Misty Stanley

## 2017-02-24 NOTE — Telephone Encounter (Signed)
Pt daughter made aware - routed to pcp

## 2017-02-25 ENCOUNTER — Ambulatory Visit (INDEPENDENT_AMBULATORY_CARE_PROVIDER_SITE_OTHER): Payer: Medicare Other | Admitting: Family Medicine

## 2017-02-25 ENCOUNTER — Encounter: Payer: Self-pay | Admitting: Family Medicine

## 2017-02-25 VITALS — BP 118/76 | HR 92 | Temp 97.7°F | Resp 18 | Ht 65.0 in | Wt 170.1 lb

## 2017-02-25 DIAGNOSIS — F418 Other specified anxiety disorders: Secondary | ICD-10-CM

## 2017-02-25 DIAGNOSIS — I4891 Unspecified atrial fibrillation: Secondary | ICD-10-CM | POA: Diagnosis not present

## 2017-02-25 MED ORDER — ALPRAZOLAM 0.25 MG PO TABS: 0.2500 mg | ORAL_TABLET | Freq: Every evening | ORAL | 0 refills | Status: DC | PRN

## 2017-02-25 MED ORDER — ENOXAPARIN SODIUM 40 MG/0.4ML ~~LOC~~ SOLN: 40.0000 mg | Freq: Two times a day (BID) | SUBCUTANEOUS | 0 refills | Status: DC

## 2017-02-25 NOTE — Patient Instructions (Signed)
Stay as active as you can manage Try to increase your fluid intake  Take the xanax (alprazolam) at night just at the beginning of anxiety ( do not wait) Repeat a second dose of the first pill does not work  The lovenox is used 2 X a day for 4 days prior to your scope Resume the warfarin the following day  See me in 2-3 weeks

## 2017-02-25 NOTE — Progress Notes (Signed)
Chief Complaint  Patient presents with  . Panic Attack   Here for two reasons: She needs a "bridge" of anticoagulation for her upcoming EGD according to patient information from her cardiologist' discussed lovenox q 12 hours for 5 d surrounding test, and resuming warfarin after.   Lovenox 40 BID ordered.  She can come here for teaching [place] is sleep deprived.  She is having a great deal of difficulty with sleep  Every night when the sun goes down she starts to feel upset and anxious.  She describes a feeling that something bad is going to happen.  She has increased heart beat and a feeling like she cannot breathe.  She cannot lie flat in the bed.  Chest feels tight. Her a fib is stable.  Her BP is good.  Her oxygen saturation is always normal.  We discussed that I feel ike she has anxiety and may need medication to calm herself - until she can start to feel better.  Alprazolam discussed - great for anxiety but NOT a good long term med.  Also increased risk of falls.  She is here with her husband.  He will watch her while on.  Patient Active Problem List   Diagnosis Date Noted  . Esophageal dysphagia 02/03/2017  . Dizziness 01/07/2017  . Monitoring for long-term anticoagulant use 12/18/2016  . CKD (chronic kidney disease), stage III 12/12/2016  . Seizure disorder (HCC) 12/12/2016  . Presbyesophagus 12/11/2016  . History of esophageal stricture 12/10/2016  . Dysphagia   . AKI (acute kidney injury) (HCC)   . Atrial fibrillation with RVR (HCC) 12/01/2016  . Pulmonary emboli (HCC) 12/01/2016  . GERD (gastroesophageal reflux disease) 12/01/2016  . Arthralgia of both knees 01/11/2016  . Hypertension 09/22/2011    Outpatient Encounter Prescriptions as of 02/25/2017  Medication Sig  . atorvastatin (LIPITOR) 10 MG tablet Take 10 mg by mouth every evening.   . carbamazepine (TEGRETOL XR) 100 MG 12 hr tablet Take 1 tablet (100 mg total) by mouth 2 (two) times daily.  . cephALEXin (KEFLEX)  500 MG capsule Take 1 capsule (500 mg total) by mouth 4 (four) times daily.  Marland Kitchen diltiazem (CARDIZEM CD) 240 MG 24 hr capsule Take 2 capsules (480 mg total) by mouth daily.  . furosemide (LASIX) 40 MG tablet Take 1 tablet (40 mg total) by mouth daily.  Marland Kitchen guaiFENesin (MUCINEX) 600 MG 12 hr tablet Take 600 mg by mouth 2 (two) times daily as needed for cough or to loosen phlegm.   . metoprolol succinate (TOPROL-XL) 100 MG 24 hr tablet Take 1 tablet twice daily (Patient taking differently: Take 100 mg by mouth 2 (two) times daily. )  . oseltamivir (TAMIFLU) 75 MG capsule Take 1 capsule (75 mg total) by mouth every 12 (twelve) hours.  . pantoprazole (PROTONIX) 40 MG tablet Take 40 mg by mouth daily.   . potassium chloride (K-DUR,KLOR-CON) 10 MEQ tablet Take 1 tablet (10 mEq total) by mouth 2 (two) times daily.  Marland Kitchen warfarin (COUMADIN) 2.5 MG tablet Take 2.5-5 mg by mouth every evening. Pt takes two tablets on Monday, Wednesday, Thursday, Friday, and Sunday. Pt takes one tablet on Tuesday and Saturday.  . ALPRAZolam (XANAX) 0.25 MG tablet Take 1 tablet (0.25 mg total) by mouth at bedtime as needed for anxiety. May repeat dose if needed  . enoxaparin (LOVENOX) 40 MG/0.4ML injection Inject 0.4 mLs (40 mg total) into the skin every 12 (twelve) hours.   No facility-administered encounter medications on  file as of 02/25/2017.     Allergies  Allergen Reactions  . Codeine Nausea Only    Review of Systems  Constitutional: Positive for activity change, appetite change and fatigue. Negative for unexpected weight change.  HENT: Negative for congestion and dental problem.   Eyes: Negative for visual disturbance.  Respiratory: Positive for cough and chest tightness.   Cardiovascular: Positive for palpitations. Negative for chest pain and leg swelling.  Gastrointestinal: Positive for nausea. Negative for constipation, diarrhea and vomiting.  Genitourinary: Positive for frequency.       Lasix  Musculoskeletal:  Negative.  Negative for gait problem.  Skin: Negative.  Negative for color change.  Neurological: Negative for dizziness and headaches.  Hematological: Bruises/bleeds easily.  Psychiatric/Behavioral: Positive for agitation, behavioral problems and sleep disturbance. Negative for dysphoric mood, hallucinations, self-injury and suicidal ideas. The patient is nervous/anxious.     BP 118/76 (BP Location: Right Arm, Patient Position: Sitting, Cuff Size: Normal)   Pulse 92   Temp 97.7 F (36.5 C) (Temporal)   Resp 18   Ht 5\' 5"  (1.651 m)   Wt 170 lb 1.9 oz (77.2 kg)   SpO2 97%   BMI 28.31 kg/m   Physical Exam  Constitutional: She is oriented to person, place, and time. She appears well-developed and well-nourished. She appears distressed.  Frustrated, tired, unhappy  HENT:  Head: Normocephalic and atraumatic.  Mouth/Throat: Oropharynx is clear and moist.  Eyes: Conjunctivae are normal. Pupils are equal, round, and reactive to light.  Neck: Normal range of motion. No JVD present.  Cardiovascular: Normal rate.   irreg  Pulmonary/Chest: Effort normal. She has no rales.  clear  Abdominal: Soft. Bowel sounds are normal. She exhibits no distension. There is no tenderness.  Musculoskeletal: Normal range of motion. She exhibits no edema.  No edema  Lymphadenopathy:    She has no cervical adenopathy.  Neurological: She is alert and oriented to person, place, and time.  Psychiatric: She has a normal mood and affect. Her behavior is normal.    ASSESSMENT/PLAN:  1. Atrial fibrillation with RVR (HCC) Controlled.  Discussed Lovenox  2. Situational anxiety As above   Patient Instructions  Stay as active as you can manage Try to increase your fluid intake  Take the xanax (alprazolam) at night just at the beginning of anxiety ( do not wait) Repeat a second dose of the first pill does not work  The lovenox is used 2 X a day for 4 days prior to your scope Resume the warfarin the  following day  See me in 2-3 weeks   Eustace Moore, MD

## 2017-03-03 ENCOUNTER — Telehealth: Payer: Self-pay

## 2017-03-03 ENCOUNTER — Ambulatory Visit (INDEPENDENT_AMBULATORY_CARE_PROVIDER_SITE_OTHER): Payer: Medicare Other | Admitting: *Deleted

## 2017-03-03 DIAGNOSIS — Z5181 Encounter for therapeutic drug level monitoring: Secondary | ICD-10-CM

## 2017-03-03 DIAGNOSIS — Z7901 Long term (current) use of anticoagulants: Secondary | ICD-10-CM

## 2017-03-03 DIAGNOSIS — I4891 Unspecified atrial fibrillation: Secondary | ICD-10-CM

## 2017-03-03 LAB — POCT INR: INR: 2.6

## 2017-03-03 NOTE — Telephone Encounter (Signed)
Advised [facility]N to have pt take 40 mg of lasix daily as prescribed. She voiced understanding.

## 2017-03-03 NOTE — Telephone Encounter (Signed)
Have her take Lasix 40 mg daily as prescribed.

## 2017-03-03 NOTE — Telephone Encounter (Signed)
[facility] called to let us know the pt is up 7 lbs from last week, but has only been taking 20 mg of lasix instead of the 40 mg daily. Her vitals were all good, but pt was complaining of some increased SOB. Please advise.

## 2017-03-05 ENCOUNTER — Telehealth: Payer: Self-pay | Admitting: Family Medicine

## 2017-03-05 ENCOUNTER — Encounter (INDEPENDENT_AMBULATORY_CARE_PROVIDER_SITE_OTHER): Payer: Self-pay | Admitting: Internal Medicine

## 2017-03-05 ENCOUNTER — Telehealth: Payer: Self-pay | Admitting: Cardiovascular Disease

## 2017-03-05 NOTE — Telephone Encounter (Signed)
Daughter ([name]) calling to let you know that her mother (the patient) would like all future Rx to be sent to [facility] in [place].  They will no longer be using [facility].

## 2017-03-05 NOTE — Telephone Encounter (Signed)
Change in pharmacy  [facility], [place]   DO NOT SEND  ANYTHING TO [facility]

## 2017-03-05 NOTE — Telephone Encounter (Signed)
Noted  

## 2017-03-05 NOTE — Telephone Encounter (Signed)
Updated pharmacy

## 2017-03-08 ENCOUNTER — Telehealth: Payer: Self-pay

## 2017-03-09 ENCOUNTER — Other Ambulatory Visit: Payer: Self-pay

## 2017-03-09 MED ORDER — PANTOPRAZOLE SODIUM 40 MG PO TBEC: 40.0000 mg | DELAYED_RELEASE_TABLET | Freq: Every day | ORAL | 3 refills | Status: DC

## 2017-03-09 NOTE — Telephone Encounter (Signed)
Pt had called and left a message she had dropped a pill over the weekend and was unsure what to do. Spoke to [employer], has no issues or concerns at this time.

## 2017-03-09 NOTE — Telephone Encounter (Signed)
Seen 3 29 18 

## 2017-03-10 ENCOUNTER — Other Ambulatory Visit: Payer: Self-pay | Admitting: *Deleted

## 2017-03-10 ENCOUNTER — Other Ambulatory Visit: Payer: Self-pay

## 2017-03-10 MED ORDER — POTASSIUM CHLORIDE CRYS ER 10 MEQ PO TBCR: 10.0000 meq | EXTENDED_RELEASE_TABLET | Freq: Two times a day (BID) | ORAL | 2 refills | Status: DC

## 2017-03-11 ENCOUNTER — Ambulatory Visit (INDEPENDENT_AMBULATORY_CARE_PROVIDER_SITE_OTHER): Payer: Medicare Other | Admitting: *Deleted

## 2017-03-11 ENCOUNTER — Telehealth: Payer: Self-pay

## 2017-03-11 DIAGNOSIS — I4891 Unspecified atrial fibrillation: Secondary | ICD-10-CM

## 2017-03-11 DIAGNOSIS — Z7901 Long term (current) use of anticoagulants: Secondary | ICD-10-CM

## 2017-03-11 DIAGNOSIS — Z5181 Encounter for therapeutic drug level monitoring: Secondary | ICD-10-CM

## 2017-03-11 LAB — POCT INR: INR: 3.4

## 2017-03-11 MED ORDER — ENOXAPARIN SODIUM 40 MG/0.4ML ~~LOC~~ SOLN: 40.0000 mg | Freq: Two times a day (BID) | SUBCUTANEOUS | 0 refills | Status: DC

## 2017-03-11 MED ORDER — WARFARIN SODIUM 2.5 MG PO TABS: ORAL_TABLET | ORAL | 3 refills | Status: DC

## 2017-03-11 NOTE — Telephone Encounter (Signed)
Per [facility] from [facility], pt is taking Lasix 40 mg as directed but her ankles have slight swelling. Weight is 175 lbs, up 3 lbs since last visit on 02/17/17   She c/o feeling she needs to urinate but cannot empty bladder.[facility] will call her pcp.She has apt with her next week.   [facility] nurse wonders if you want any changes with meds

## 2017-03-11 NOTE — Patient Instructions (Signed)
Lovenox 40mg  sq twice daily at 9am & 9pm ordered by Dr Delton See  4/12  INR 3.4  Hold coumadin tonight 4/13  No lovenox or coumadin 4/14  Lovenox 40mg  sq at 9am & 9pm 4/15  Lovenox 40mg  sq at 9am & 9pm 4/16  Lovenox 40mg  sq at 9am & 9pm 4/17  Lovenox 40mg  sq at 9am & no lovenox in pm 4/18  No lovenox in am ------procedure ------ coumadin 7.5mg  pm 4/19  Lovenox 40mg  sq at 9am & 9pm and coumadin 7.5mg  pm 4/20  Lovenox 40mg  sq at 9am & 9pm and coumadin 5mg  pm 4/21   Lovenox 40mg  sq at 9am & 9pm and coumadin 5mg  pm 4/22   Lovenox 40mg  sq at 9am & 9pm and coumadin 5mg  pm 4/23   Lovenox 40mg  sq at 9am & INR check by [facility] with results to me  Orders given to Norton Pastel RN [facility]

## 2017-03-12 ENCOUNTER — Telehealth: Payer: Self-pay

## 2017-03-12 ENCOUNTER — Telehealth: Payer: Self-pay | Admitting: Cardiovascular Disease

## 2017-03-12 NOTE — Telephone Encounter (Signed)
Spoke with [facility] Rami Farnum) advised her of changes. She will call daughter to inform.

## 2017-03-12 NOTE — Telephone Encounter (Signed)
Karen Richardson from [facility] is calling stating pt is complaining of urgency and frequency with little output. Called cardio and they advised she call pcp. Please advise

## 2017-03-12 NOTE — Telephone Encounter (Signed)
Talked to patient's daughter Sedalia Muta.     Instructed to continue start holding warfarin today as previously instructed and ship the 1st dose of Lovenox tomorrow morning.  Lovenox to start tomorrow with 9pm dose.    May call back to coumadin clinic for any addiotional clarifications needed.

## 2017-03-12 NOTE — Telephone Encounter (Signed)
I have attempted to call Pretty x 2, phone has been busy.

## 2017-03-12 NOTE — Telephone Encounter (Signed)
Yes, but needs it here by 1:30

## 2017-03-12 NOTE — Telephone Encounter (Signed)
Would you like them to bring in a urine?

## 2017-03-12 NOTE — Telephone Encounter (Signed)
Can increase to 40 mg bid x 3 days. Then get evaluated by PCP.

## 2017-03-12 NOTE — Telephone Encounter (Signed)
If Home Health RN is calling please get Coumadin Nurse on the phone STAT  1.  Are you calling in regards to an appointment? no  2.  Are you calling for a refill ? no  3.  Are you having bleeding issues? no  4.  Do you need clearance to hold Coumadin? No  Patients daughter called because patient took her regular 5 mg dose last night.  She is having a procedure Wednesday and was told not to take coumadin last night.  Please advise her on what her mother should do.

## 2017-03-17 ENCOUNTER — Ambulatory Visit (HOSPITAL_COMMUNITY)
Admission: RE | Admit: 2017-03-17 | Discharge: 2017-03-17 | Disposition: A | Discharge status: Home / Self Care | Payer: Medicare Other | Source: Ambulatory Visit | Attending: Internal Medicine | Admitting: Internal Medicine

## 2017-03-17 ENCOUNTER — Encounter (HOSPITAL_COMMUNITY)
Admission: RE | Disposition: A | Discharge status: Home / Self Care | Payer: Self-pay | Source: Ambulatory Visit | Attending: Internal Medicine

## 2017-03-17 ENCOUNTER — Encounter (HOSPITAL_COMMUNITY): Payer: Self-pay

## 2017-03-17 DIAGNOSIS — Z823 Family history of stroke: Secondary | ICD-10-CM | POA: Diagnosis not present

## 2017-03-17 DIAGNOSIS — K449 Diaphragmatic hernia without obstruction or gangrene: Secondary | ICD-10-CM | POA: Diagnosis not present

## 2017-03-17 DIAGNOSIS — Z800 Family history of malignant neoplasm of digestive organs: Secondary | ICD-10-CM | POA: Diagnosis not present

## 2017-03-17 DIAGNOSIS — N183 Chronic kidney disease, stage 3 (moderate): Secondary | ICD-10-CM | POA: Diagnosis not present

## 2017-03-17 DIAGNOSIS — K219 Gastro-esophageal reflux disease without esophagitis: Secondary | ICD-10-CM | POA: Insufficient documentation

## 2017-03-17 DIAGNOSIS — Z836 Family history of other diseases of the respiratory system: Secondary | ICD-10-CM | POA: Insufficient documentation

## 2017-03-17 DIAGNOSIS — R933 Abnormal findings on diagnostic imaging of other parts of digestive tract: Secondary | ICD-10-CM | POA: Insufficient documentation

## 2017-03-17 DIAGNOSIS — Z811 Family history of alcohol abuse and dependence: Secondary | ICD-10-CM | POA: Diagnosis not present

## 2017-03-17 DIAGNOSIS — I4891 Unspecified atrial fibrillation: Secondary | ICD-10-CM | POA: Diagnosis not present

## 2017-03-17 DIAGNOSIS — R1310 Dysphagia, unspecified: Secondary | ICD-10-CM | POA: Insufficient documentation

## 2017-03-17 DIAGNOSIS — R1314 Dysphagia, pharyngoesophageal phase: Secondary | ICD-10-CM | POA: Insufficient documentation

## 2017-03-17 DIAGNOSIS — I129 Hypertensive chronic kidney disease with stage 1 through stage 4 chronic kidney disease, or unspecified chronic kidney disease: Secondary | ICD-10-CM | POA: Diagnosis not present

## 2017-03-17 DIAGNOSIS — Z9882 Breast implant status: Secondary | ICD-10-CM | POA: Insufficient documentation

## 2017-03-17 DIAGNOSIS — E785 Hyperlipidemia, unspecified: Secondary | ICD-10-CM | POA: Diagnosis not present

## 2017-03-17 DIAGNOSIS — Z90710 Acquired absence of both cervix and uterus: Secondary | ICD-10-CM | POA: Diagnosis not present

## 2017-03-17 DIAGNOSIS — F329 Major depressive disorder, single episode, unspecified: Secondary | ICD-10-CM | POA: Insufficient documentation

## 2017-03-17 DIAGNOSIS — Z86711 Personal history of pulmonary embolism: Secondary | ICD-10-CM | POA: Diagnosis not present

## 2017-03-17 DIAGNOSIS — F419 Anxiety disorder, unspecified: Secondary | ICD-10-CM | POA: Insufficient documentation

## 2017-03-17 DIAGNOSIS — Z79899 Other long term (current) drug therapy: Secondary | ICD-10-CM | POA: Diagnosis not present

## 2017-03-17 DIAGNOSIS — K222 Esophageal obstruction: Secondary | ICD-10-CM

## 2017-03-17 DIAGNOSIS — Z7901 Long term (current) use of anticoagulants: Secondary | ICD-10-CM | POA: Insufficient documentation

## 2017-03-17 DIAGNOSIS — Z9889 Other specified postprocedural states: Secondary | ICD-10-CM | POA: Diagnosis not present

## 2017-03-17 DIAGNOSIS — R1319 Other dysphagia: Secondary | ICD-10-CM | POA: Insufficient documentation

## 2017-03-17 DIAGNOSIS — Z8673 Personal history of transient ischemic attack (TIA), and cerebral infarction without residual deficits: Secondary | ICD-10-CM | POA: Insufficient documentation

## 2017-03-17 HISTORY — PX: ESOPHAGOGASTRODUODENOSCOPY: SHX5428

## 2017-03-17 HISTORY — PX: ESOPHAGEAL DILATION: SHX303

## 2017-03-17 SURGERY — EGD (ESOPHAGOGASTRODUODENOSCOPY)
Anesthesia: Moderate Sedation

## 2017-03-17 MED ORDER — MIDAZOLAM HCL 5 MG/5ML IJ SOLN
INTRAMUSCULAR | Status: AC
  Filled 2017-03-17: qty 10

## 2017-03-17 MED ORDER — LIDOCAINE VISCOUS 2 % MT SOLN
OROMUCOSAL | Status: DC | PRN
  Administered 2017-03-17: 1 via OROMUCOSAL

## 2017-03-17 MED ORDER — MIDAZOLAM HCL 5 MG/5ML IJ SOLN
INTRAMUSCULAR | Status: DC | PRN
  Administered 2017-03-17: 2 mg via INTRAVENOUS
  Administered 2017-03-17: 1 mg via INTRAVENOUS

## 2017-03-17 MED ORDER — ENOXAPARIN SODIUM 40 MG/0.4ML ~~LOC~~ SOLN: 40.0000 mg | Freq: Two times a day (BID) | SUBCUTANEOUS | 0 refills | Status: DC

## 2017-03-17 MED ORDER — LIDOCAINE VISCOUS 2 % MT SOLN
OROMUCOSAL | Status: AC
  Filled 2017-03-17: qty 15

## 2017-03-17 MED ORDER — SODIUM CHLORIDE 0.9 % IV SOLN
INTRAVENOUS | Status: DC
  Administered 2017-03-17: 09:00:00 via INTRAVENOUS

## 2017-03-17 MED ORDER — MEPERIDINE HCL 50 MG/ML IJ SOLN
INTRAMUSCULAR | Status: DC | PRN
  Administered 2017-03-17 (×2): 25 mg via INTRAVENOUS

## 2017-03-17 MED ORDER — MEPERIDINE HCL 50 MG/ML IJ SOLN
INTRAMUSCULAR | Status: AC
  Filled 2017-03-17: qty 1

## 2017-03-17 NOTE — Op Note (Signed)
[facility] Patient Name: Karen Richardson Procedure Date: 03/17/2017 8:56 AM MRN: [id number] Date of Birth: 1934-07-17 Attending MD: Lionel December , MD CSN: [id number] Age: 83 Admit Type: Outpatient Procedure:                Upper GI endoscopy Indications:              Esophageal dysphagia, Abnormal esophagram Providers:                Lionel December, MD, Nena Polio, RN, Toniann Fail                            RN, RN Referring MD:             Ignatius Specking, MD Medicines:                Lidocaine viscous for oropharyngeal topical                            anesthesia, Meperidine 50 mg IV, Midazolam 3 mg IV Complications:            No immediate complications. Estimated Blood Loss:     Estimated blood loss was minimal. Procedure:                Pre-Anesthesia Assessment:                           - Prior to the procedure, a History and Physical                            was performed, and patient medications and                            allergies were reviewed. The patient's tolerance of                            previous anesthesia was also reviewed. The risks                            and benefits of the procedure and the sedation                            options and risks were discussed with the patient.                            All questions were answered, and informed consent                            was obtained. Prior Anticoagulants: The patient                            last took Coumadin (warfarin) 5 days and Lovenox                            (enoxaparin) 1 day prior to the procedure. ASA  Grade Assessment: III - A patient with severe                            systemic disease. After reviewing the risks and                            benefits, the patient was deemed in satisfactory                            condition to undergo the procedure.                           After obtaining informed consent, the endoscope was               passed under direct vision. Throughout the                            procedure, the patient's blood pressure, pulse, and                            oxygen saturations were monitored continuously. The                            EG-[id number] ([id number]) scope was introduced through the                            mouth, and advanced to the second part of duodenum.                            The upper GI endoscopy was accomplished without                            difficulty. The patient tolerated the procedure                            well. Scope In: 9:26:00 AM Scope Out: 9:34:55 AM Total Procedure Duration: 0 hours 8 minutes 55 seconds  Findings:      The examined esophagus was normal.      One mild (non-circumferential scarring) benign-appearing, intrinsic       stenosis was found 38 cm from the incisors. This measured 1.3 cm (inner       diameter) x less than one cm (in length) and was traversed. A TTS       dilator was passed through the scope. Dilation with an 18-19-20 mm       balloon dilator was performed to 18 mm. The dilation site was examined       and showed mild improvement in luminal narrowing. small mucosalon noted      A 2 cm hiatal hernia was present.      The entire examined stomach was normal.      The duodenal bulb and second portion of the duodenum were normal. Impression:               - Normal esophagus.                           -  Benign-appearing esophageal stenosis. Dilated.                           - 2 cm hiatal hernia.                           - Normal stomach.                           - Normal duodenal bulb and second portion of the                            duodenum.                           - No specimens collected. Moderate Sedation:      Moderate (conscious) sedation was administered by the endoscopy nurse       and supervised by the endoscopist. The following parameters were       monitored: oxygen saturation, heart rate, blood pressure,  CO2       capnography and response to care. Total physician intraservice time was       13 minutes. Recommendation:           - Patient has a contact number available for                            emergencies. The signs and symptoms of potential                            delayed complications were discussed with the                            patient. Return to normal activities tomorrow.                            Written discharge instructions were provided to the                            patient.                           - Resume previous diet today.                           - Continue present medications.                           - Resume Coumadin (warfarin) today and Lovenox                            (enoxaparin) tomorrow at prior doses. Refer to                            Coumadin Clinic for further adjustment of therapy. Procedure Code(s):        --- Professional ---  [phone], Esophagogastroduodenoscopy, flexible,                            transoral; with transendoscopic balloon dilation of                            esophagus (less than 30 mm diameter)                           99152, Moderate sedation services provided by the                            same physician or other qualified health care                            professional performing the diagnostic or                            therapeutic service that the sedation supports,                            requiring the presence of an independent trained                            observer to assist in the monitoring of the                            patient's level of consciousness and physiological                            status; initial 15 minutes of intraservice time,                            patient age 70 years or older Diagnosis Code(s):        --- Professional ---                           K22.2, Esophageal obstruction                           K44.9, Diaphragmatic hernia  without obstruction or                            gangrene                           R13.14, Dysphagia, pharyngoesophageal phase                           R93.3, Abnormal findings on diagnostic imaging of                            other parts of digestive tract CPT copyright 2016 American Medical Association. All rights reserved. The codes documented in this report are preliminary and upon coder review may  be revised to meet current compliance requirements. Lionel December, MD  Lionel December, MD 03/17/2017 9:45:57 AM This report has been signed electronically. Number of Addenda: 0

## 2017-03-17 NOTE — H&P (Signed)
Karen Richardson is an 83 y.o. female.   Chief Complaint: Patient is here for EGD and ED. HPI: Patient is a year-old Caucasian female with multiple medical problems including history of A. fib and pulmonary embolism who presents with few months history of dysphagia to solids. Occasionally she has difficulty with liquids. She points or sternal areas site of bolus obstruction. She had barium study about 2 months ago which presbyesophagus as well as stricture at GE junction delay in passage of barium pill across it. She says heartburns well controlled with PPI. She denies abdominal pain or melena. She has been off warfarin for 5 days and last dose of Lovenox was yesterday. She underwent EGD with ED and April 2013 by Dr. Teena Dunk of [facility]. She was felt to have Schatzki's ring. Biopsy from distal esophagus showed changes of reflux esophagitis.  Past Medical History:  Diagnosis Date  . Anxiety   . Atrial fibrillation with RVR (HCC) 12/01/2016  . CKD (chronic kidney disease), stage III 12/12/2016  . Depression   . Dysphagia   . GERD (gastroesophageal reflux disease) 12/01/2016  . Heart murmur   . History of esophageal stricture 12/10/2016  . Hyperlipidemia   . Hypertension    x 4 months  . Presbyesophagus 12/11/2016  . Pulmonary emboli (HCC) 12/01/2016  . Seizures (HCC)   . Stroke [facility])     Past Surgical History:  Procedure Laterality Date  . ABDOMINAL HYSTERECTOMY    . BREAST ENHANCEMENT SURGERY     40 yrs ago  . PARTIAL HYSTERECTOMY    . Tumor removed from spinal cord     benign    Family History  Problem Relation Age of Onset  . Pulmonary embolism Mother   . Cancer Mother     mass in colon  . Stroke Father   . Alcohol abuse Father    Social History:  reports that she has never smoked. She has never used smokeless tobacco. She reports that she does not drink alcohol or use drugs.  Allergies:  Allergies  Allergen Reactions  . Codeine Nausea Only    Medications Prior to  Admission  Medication Sig Dispense Refill  . acetaminophen (TYLENOL) 500 MG tablet Take 500 mg by mouth every 6 (six) hours as needed (for pain.).    Marland Kitchen ALPRAZolam (XANAX) 0.25 MG tablet Take 1 tablet (0.25 mg total) by mouth at bedtime as needed for anxiety. May repeat dose if needed (Patient taking differently: Take 0.25 mg by mouth at bedtime and may repeat dose one time if needed. May repeat dose if needed) 30 tablet 0  . atorvastatin (LIPITOR) 10 MG tablet Take 10 mg by mouth every evening.     . carbamazepine (TEGRETOL XR) 100 MG 12 hr tablet Take 1 tablet (100 mg total) by mouth 2 (two) times daily. 60 tablet 1  . diltiazem (CARDIZEM CD) 240 MG 24 hr capsule Take 2 capsules (480 mg total) by mouth daily. 60 capsule 3  . enoxaparin (LOVENOX) 40 MG/0.4ML injection Inject 0.4 mLs (40 mg total) into the skin every 12 (twelve) hours. 10 Syringe 0  . furosemide (LASIX) 40 MG tablet Take 1 tablet (40 mg total) by mouth daily. (Patient taking differently: Take 40 mg by mouth daily at 6 PM. )    . metoprolol succinate (TOPROL-XL) 100 MG 24 hr tablet Take 1 tablet twice daily (Patient taking differently: Take 100 mg by mouth 2 (two) times daily. ) 60 tablet 3  . pantoprazole (PROTONIX)  40 MG tablet Take 1 tablet (40 mg total) by mouth daily. 90 tablet 3  . potassium chloride (K-DUR,KLOR-CON) 10 MEQ tablet Take 1 tablet (10 mEq total) by mouth 2 (two) times daily. 60 tablet 2  . cephALEXin (KEFLEX) 500 MG capsule Take 1 capsule (500 mg total) by mouth 4 (four) times daily. (Patient not taking: Reported on 03/12/2017) 28 capsule 0  . oseltamivir (TAMIFLU) 75 MG capsule Take 1 capsule (75 mg total) by mouth every 12 (twelve) hours. (Patient not taking: Reported on 03/12/2017) 10 capsule 0  . warfarin (COUMADIN) 2.5 MG tablet Take as directed by the anticoagulation clinic (Patient taking differently: Take 2.5-5 mg by mouth See admin instructions. Take 2 tablets (5 mg) on all days with the exception of Saturday &  Tuesday ONLY take 1 tablet (2.5 mg)) 90 tablet 3    No results found for this or any previous visit (from the past 48 hour(s)). No results found.  ROS  Blood pressure (!) 128/98, pulse 90, temperature 97.9 F (36.6 C), temperature source Oral, resp. rate (!) 22, SpO2 97 %. Physical Exam  Constitutional: She appears well-developed and well-nourished.  HENT:  Mouth/Throat: Oropharynx is clear and moist.  Eyes: Conjunctivae are normal. No scleral icterus.  Neck: No thyromegaly present.  Cardiovascular:  Irregular rhythm normal S1 and S2. No murmur or gallop noted.  Respiratory: Effort normal and breath sounds normal.  GI: Soft. She exhibits no distension and no mass. There is no tenderness.  Musculoskeletal: She exhibits edema (trace edema around both ankles.).  Lymphadenopathy:    She has cervical adenopathy.  Neurological: She is alert.  Skin: Skin is warm and dry.     Assessment/Plan Esophageal dysphagia. Distal esophageal stricture. EGD with ED.  Lionel December, MD 03/17/2017, 9:14 AM

## 2017-03-17 NOTE — Discharge Instructions (Signed)
Resume warfarin at usual dose starting this evening. Get INR checked in 7-10 days. Resume Lovenox tomorrow morning continue until prescription runs out. Resume other medications as before. Resume usual diet. Remember to chew food thoroughly before swallowing. No driving for 24 hours. She's call office with progress report in one week.   Upper Endoscopy, Care After Refer to this sheet in the next few weeks. These instructions provide you with information about caring for yourself after your procedure. Your health care provider may also give you more specific instructions. Your treatment has been planned according to current medical practices, but problems sometimes occur. Call your health care provider if you have any problems or questions after your procedure. What can I expect after the procedure? After the procedure, it is common to have:  A sore throat.  Bloating.  Nausea. Follow these instructions at home:  Follow instructions from your health care provider about what to eat or drink after your procedure.  Return to your normal activities as told by your health care provider. Ask your health care provider what activities are safe for you.  Take over-the-counter and prescription medicines only as told by your health care provider.  Do not drive for 24 hours if you received a sedative.  Keep all follow-up visits as told by your health care provider. This is important. Contact a health care provider if:  You have a sore throat that lasts longer than one day.  You have trouble swallowing. Get help right away if:  You have a fever.  You vomit blood or your vomit looks like coffee grounds.  You have bloody, black, or tarry stools.  You have a severe sore throat or you cannot swallow.  You have difficulty breathing.  You have severe pain in your chest or belly. This information is not intended to replace advice given to you by your health care provider. Make sure you  discuss any questions you have with your health care provider. Document Released: 05/17/2012 Document Revised: 04/23/2016 Document Reviewed: 08/29/2015 Elsevier Interactive Patient Education  2017 [employer].

## 2017-03-18 ENCOUNTER — Encounter: Payer: Self-pay | Admitting: Family Medicine

## 2017-03-18 ENCOUNTER — Telehealth: Payer: Self-pay | Admitting: Family Medicine

## 2017-03-18 ENCOUNTER — Telehealth: Payer: Self-pay | Admitting: *Deleted

## 2017-03-18 ENCOUNTER — Encounter (INDEPENDENT_AMBULATORY_CARE_PROVIDER_SITE_OTHER): Payer: Self-pay | Admitting: *Deleted

## 2017-03-18 ENCOUNTER — Telehealth: Payer: Self-pay | Admitting: Cardiovascular Disease

## 2017-03-18 ENCOUNTER — Ambulatory Visit (INDEPENDENT_AMBULATORY_CARE_PROVIDER_SITE_OTHER): Payer: Medicare Other | Admitting: Family Medicine

## 2017-03-18 ENCOUNTER — Encounter (INDEPENDENT_AMBULATORY_CARE_PROVIDER_SITE_OTHER): Payer: Self-pay | Admitting: Internal Medicine

## 2017-03-18 VITALS — BP 118/70 | HR 72 | Temp 97.9°F | Resp 18 | Ht 65.0 in | Wt 176.0 lb

## 2017-03-18 DIAGNOSIS — I4891 Unspecified atrial fibrillation: Secondary | ICD-10-CM

## 2017-03-18 DIAGNOSIS — Z8719 Personal history of other diseases of the digestive system: Secondary | ICD-10-CM | POA: Diagnosis not present

## 2017-03-18 DIAGNOSIS — K219 Gastro-esophageal reflux disease without esophagitis: Secondary | ICD-10-CM

## 2017-03-18 NOTE — Telephone Encounter (Signed)
Patient is scheduled with Dr.Koneswaran 03/30/17 at 2pm

## 2017-03-18 NOTE — Patient Instructions (Addendum)
Continue with the pantoprazole daily This reduced stomach acid and reduces likelihood of the stricture ( narrowing ) recurring.  Take furosemide once a day for fluid When you see a weight gain or increase in fluid, double the furosemide Take two a day for the next 3 days for the fluid problem today Make sure you limit salt intake  We will call Dr Purvis Sheffield to try to get you an appointment as soon as possible for follow up.  You need to be seen before your appointment end of May.  See me in 3 months

## 2017-03-18 NOTE — Telephone Encounter (Signed)
Spoke with pt's daughter and instructed to continue with instructions regarding coumadin and lovenox as instructed on the visit with Misty Stanley . Pt's daughter states understanding

## 2017-03-18 NOTE — Progress Notes (Signed)
Chief Complaint  Patient presents with  . Follow-up   Patient is here for follow-up. At last visit she complained of difficulty sleeping, anxiety, evening stress and panic. I gave her prescription for Xanax. She took a few of these. She states it's gradually the problems getting better. She has not taken now for over a week. She has several pills left. She understands that she will take these only as needed. She went yesterday for her esophageal examination and dilatation. It was successful. She is back on her anticoagulant. She is eager to get back in with cardiology . She has an appointment scheduled for the end of May. We will call and see if we can get her seen sooner. She continues to have shortness of breath, fluttering in her chest, recurring pedal edema. Patient and husband wanted an explanation regarding what was wrong with her esophagus. The best of my ability I described what happens with esophageal stricture. I explained that recurrence is possible. She should stay on her Protonix.  Patient Active Problem List   Diagnosis Date Noted  . Esophageal dysphagia 02/03/2017  . Dizziness 01/07/2017  . Monitoring for long-term anticoagulant use 12/18/2016  . CKD (chronic kidney disease), stage III 12/12/2016  . Seizure disorder (HCC) 12/12/2016  . Presbyesophagus 12/11/2016  . History of esophageal stricture 12/10/2016  . Dysphagia   . AKI (acute kidney injury) (HCC)   . Atrial fibrillation with RVR (HCC) 12/01/2016  . Pulmonary emboli (HCC) 12/01/2016  . GERD (gastroesophageal reflux disease) 12/01/2016  . Arthralgia of both knees 01/11/2016  . Hypertension 09/22/2011    Outpatient Encounter Prescriptions as of 03/18/2017  Medication Sig  . acetaminophen (TYLENOL) 500 MG tablet Take 500 mg by mouth every 6 (six) hours as needed (for pain.).  Marland Kitchen ALPRAZolam (XANAX) 0.25 MG tablet Take 1 tablet (0.25 mg total) by mouth at bedtime as needed for anxiety. May repeat dose if needed  (Patient taking differently: Take 0.25 mg by mouth at bedtime and may repeat dose one time if needed. May repeat dose if needed)  . atorvastatin (LIPITOR) 10 MG tablet Take 10 mg by mouth every evening.   . carbamazepine (TEGRETOL XR) 100 MG 12 hr tablet Take 1 tablet (100 mg total) by mouth 2 (two) times daily.  Marland Kitchen diltiazem (CARDIZEM CD) 240 MG 24 hr capsule Take 2 capsules (480 mg total) by mouth daily.  Marland Kitchen enoxaparin (LOVENOX) 40 MG/0.4ML injection Inject 0.4 mLs (40 mg total) into the skin every 12 (twelve) hours.  . furosemide (LASIX) 40 MG tablet Take 1 tablet (40 mg total) by mouth daily. (Patient taking differently: Take 40 mg by mouth daily at 6 PM. )  . metoprolol succinate (TOPROL-XL) 100 MG 24 hr tablet Take 1 tablet twice daily (Patient taking differently: Take 100 mg by mouth 2 (two) times daily. )  . pantoprazole (PROTONIX) 40 MG tablet Take 1 tablet (40 mg total) by mouth daily.  . potassium chloride (K-DUR,KLOR-CON) 10 MEQ tablet Take 1 tablet (10 mEq total) by mouth 2 (two) times daily.  Marland Kitchen warfarin (COUMADIN) 2.5 MG tablet Take as directed by the anticoagulation clinic (Patient taking differently: Take 2.5-5 mg by mouth See admin instructions. Take 2 tablets (5 mg) on all days with the exception of Saturday & Tuesday ONLY take 1 tablet (2.5 mg))   No facility-administered encounter medications on file as of 03/18/2017.     Allergies  Allergen Reactions  . Codeine Nausea Only    Review of Systems  Constitutional: Positive for fatigue and unexpected weight change.       Weight gain, increased edema  HENT: Negative for congestion and trouble swallowing.   Eyes: Negative for photophobia and visual disturbance.  Respiratory: Negative for cough and shortness of breath.   Cardiovascular: Positive for palpitations and leg swelling. Negative for chest pain.  Gastrointestinal: Positive for abdominal distention. Negative for abdominal pain.  Genitourinary: Positive for frequency.  Negative for difficulty urinating.  Musculoskeletal: Negative for arthralgias and back pain.  Psychiatric/Behavioral: Negative for dysphoric mood and sleep disturbance. The patient is not nervous/anxious.     BP 118/70 (BP Location: Right Arm, Patient Position: Sitting, Cuff Size: Normal)   Pulse 72   Temp 97.9 F (36.6 C) (Temporal)   Resp 18   Ht 5\' 5"  (1.651 m)   Wt 176 lb 0.6 oz (79.9 kg)   SpO2 97%   BMI 29.29 kg/m   Physical Exam  Constitutional: She is oriented to person, place, and time. She appears well-developed and well-nourished. She appears distressed.  Appears tired  HENT:  Head: Normocephalic and atraumatic.  Mouth/Throat: Oropharynx is clear and moist.  Eyes: Conjunctivae are normal. Pupils are equal, round, and reactive to light.  Neck: Normal range of motion. No JVD present.  Cardiovascular: Normal rate.   irreg  Pulmonary/Chest: Effort normal. She has no rales.  clear  Abdominal: Soft. Bowel sounds are normal.  Musculoskeletal: Normal range of motion. She exhibits edema.  1-2+ pitting edema  Lymphadenopathy:    She has no cervical adenopathy.  Neurological: She is alert and oriented to person, place, and time.  Psychiatric: She has a normal mood and affect. Her behavior is normal.    ASSESSMENT/PLAN:  1. Atrial fibrillation with RVR (HCC) Rate controlled. Still mildly symptomatic. Current edema.  2. Gastroesophageal reflux disease, esophagitis presence not specified Recent esophageal stricture dilatation. Asymptomatic  3. History of esophageal stricture    Patient Instructions  Continue with the pantoprazole daily This reduced stomach acid and reduces likelihood of the stricture ( narrowing ) recurring.  Take furosemide once a day for fluid When you see a weight gain or increase in fluid, double the furosemide Take two a day for the next 3 days for the fluid problem today Make sure you limit salt intake  We will call Dr Purvis Sheffield to try to  get you an appointment as soon as possible for follow up.  You need to be seen before your appointment end of May.  See me in 3 months    Eustace Moore, MD

## 2017-03-18 NOTE — Telephone Encounter (Signed)
Patient went off of her coumdin on 03/11/17 due to a procedure with her throat on 03/17/17.  Needs clarification with coumdin/Lovenox injections.  Please call [name] (daughter) at [phone].

## 2017-03-18 NOTE — Telephone Encounter (Signed)
Spoke with Dr Karilyn Cota and he states may continue instructions regarding Lovenox and coumadin as given on coumadin encounter of April 12th . Pt's daughter informed of above

## 2017-03-19 ENCOUNTER — Telehealth: Payer: Self-pay | Admitting: Family Medicine

## 2017-03-19 ENCOUNTER — Other Ambulatory Visit: Payer: Self-pay

## 2017-03-19 MED ORDER — FUROSEMIDE 40 MG PO TABS
40.0000 mg | ORAL_TABLET | Freq: Every day | ORAL | 1 refills | Status: DC
Start: 2017-03-19 — End: 2017-03-30

## 2017-03-19 NOTE — Telephone Encounter (Signed)
Patient seen yesterday by Dr. Delton See and was Rx Furosemide 40mg . They are all in a daily pill pack.  [name] (daughter) is calling please advise on dosage.  She has not extra.  Can you call in to [facility] (Myra the pharmacy tech will be familiar with patient's dosage) [phone]    Dianes cb  [phone]

## 2017-03-19 NOTE — Telephone Encounter (Signed)
Called and spoke to diane, aware new rx for lasix sent in for [place].

## 2017-03-22 ENCOUNTER — Ambulatory Visit (INDEPENDENT_AMBULATORY_CARE_PROVIDER_SITE_OTHER): Payer: Medicare Other | Admitting: *Deleted

## 2017-03-22 ENCOUNTER — Ambulatory Visit: Payer: Medicare Other | Admitting: Cardiovascular Disease

## 2017-03-22 DIAGNOSIS — I4891 Unspecified atrial fibrillation: Secondary | ICD-10-CM

## 2017-03-22 DIAGNOSIS — Z7901 Long term (current) use of anticoagulants: Secondary | ICD-10-CM

## 2017-03-22 DIAGNOSIS — Z5181 Encounter for therapeutic drug level monitoring: Secondary | ICD-10-CM

## 2017-03-22 LAB — POCT INR: INR: 2.7

## 2017-03-23 ENCOUNTER — Encounter: Payer: Self-pay | Admitting: Family Medicine

## 2017-03-23 ENCOUNTER — Encounter (HOSPITAL_COMMUNITY): Payer: Self-pay | Admitting: Internal Medicine

## 2017-03-23 ENCOUNTER — Encounter: Payer: Self-pay | Admitting: Cardiovascular Disease

## 2017-03-25 ENCOUNTER — Telehealth: Payer: Self-pay | Admitting: Cardiovascular Disease

## 2017-03-25 ENCOUNTER — Emergency Department (HOSPITAL_COMMUNITY): Payer: Medicare Other

## 2017-03-25 ENCOUNTER — Emergency Department (HOSPITAL_COMMUNITY)
Admission: EM | Admit: 2017-03-25 | Discharge: 2017-03-25 | Disposition: A | Discharge status: Home / Self Care | Payer: Medicare Other | Attending: Emergency Medicine | Admitting: Emergency Medicine

## 2017-03-25 ENCOUNTER — Encounter (HOSPITAL_COMMUNITY): Payer: Self-pay

## 2017-03-25 DIAGNOSIS — Y929 Unspecified place or not applicable: Secondary | ICD-10-CM | POA: Insufficient documentation

## 2017-03-25 DIAGNOSIS — W1809XA Striking against other object with subsequent fall, initial encounter: Secondary | ICD-10-CM | POA: Insufficient documentation

## 2017-03-25 DIAGNOSIS — N183 Chronic kidney disease, stage 3 (moderate): Secondary | ICD-10-CM | POA: Insufficient documentation

## 2017-03-25 DIAGNOSIS — I129 Hypertensive chronic kidney disease with stage 1 through stage 4 chronic kidney disease, or unspecified chronic kidney disease: Secondary | ICD-10-CM | POA: Insufficient documentation

## 2017-03-25 DIAGNOSIS — Z79899 Other long term (current) drug therapy: Secondary | ICD-10-CM | POA: Diagnosis not present

## 2017-03-25 DIAGNOSIS — S0990XA Unspecified injury of head, initial encounter: Secondary | ICD-10-CM

## 2017-03-25 DIAGNOSIS — Y9389 Activity, other specified: Secondary | ICD-10-CM | POA: Insufficient documentation

## 2017-03-25 DIAGNOSIS — Y999 Unspecified external cause status: Secondary | ICD-10-CM | POA: Insufficient documentation

## 2017-03-25 DIAGNOSIS — W19XXXA Unspecified fall, initial encounter: Secondary | ICD-10-CM

## 2017-03-25 NOTE — ED Triage Notes (Signed)
Pt. Karen Richardson and hit the back of her head today. This happened around 0915. Went to sit down and chair moved back and she hit the table with her head and then she fell to the floor. Didn't lose consciousness, but did get nauseated. Pt. Feels fine now, but wants to get checked out. States her "eyes feel hazy."

## 2017-03-25 NOTE — Discharge Instructions (Signed)
You were seen in the Emergency Department (ED) today for a head injury.  Based on your evaluation, you may have sustained a concussion (or bruise) to your brain.  If you had a CT scan done, it did not show any evidence of serious injury or bleeding.   ° °Symptoms to expect from a concussion include nausea, mild to moderate headache, difficulty concentrating or sleeping, and mild lightheadedness.  These symptoms should improve over the next few days to weeks, but it may take many weeks before you feel back to normal.  Return to the emergency department or follow-up with your primary care doctor if your symptoms are not improving over this time. ° °Signs of a more serious head injury include vomiting, severe headache, excessive sleepiness or confusion, and weakness or numbness in your face, arms or legs.  Return immediately to the Emergency Department if you experience any of these more concerning symptoms.   ° °Rest, avoid strenuous physical or mental activity, and avoid activities that could potentially result in another head injury until all your symptoms from this head injury are completely resolved for at least 2-3 weeks.  You may take acetaminophen over the counter according to label instructions for mild headache or scalp soreness. ° °

## 2017-03-25 NOTE — ED Provider Notes (Signed)
Emergency Department Provider Note  By signing my name below, I, Bobbie Stack, attest that this documentation has been prepared under the direction and in the presence of Maia Plan, MD. Electronically Signed: Bobbie Stack, Scribe. 03/25/17. 12:10 PM.   I have reviewed the triage vital signs and the nursing notes.   HISTORY  Chief Complaint Fall   HPI Comments: Karen Richardson is a 83 y.o. female who presents to the Emergency Department complaining of pain in her occipital area s/p fall that occurred around 9:15 am today. She states that she is very clumsy. She was trying to sit in her chair in front of her computer when the chair slipped out from under her. She states that she hit the back of her head on the desk. She reports some soreness around her occipital area currently. She is currently on the blood thinner, Coumadin. She denies LOC.  Past Medical History:  Diagnosis Date  . Anxiety   . Atrial fibrillation with RVR (HCC) 12/01/2016  . CKD (chronic kidney disease), stage III 12/12/2016  . Depression   . Dysphagia   . GERD (gastroesophageal reflux disease) 12/01/2016  . Heart murmur   . History of esophageal stricture 12/10/2016  . Hyperlipidemia   . Hypertension    x 4 months  . Presbyesophagus 12/11/2016  . Pulmonary emboli (HCC) 12/01/2016  . Seizures (HCC)   . Stroke [facility])     Patient Active Problem List   Diagnosis Date Noted  . Esophageal dysphagia 02/03/2017  . Dizziness 01/07/2017  . Monitoring for [place]-term anticoagulant use 12/18/2016  . CKD (chronic kidney disease), stage III 12/12/2016  . Seizure disorder (HCC) 12/12/2016  . Presbyesophagus 12/11/2016  . History of esophageal stricture 12/10/2016  . Dysphagia   . AKI (acute kidney injury) (HCC)   . Atrial fibrillation with RVR (HCC) 12/01/2016  . Pulmonary emboli (HCC) 12/01/2016  . GERD (gastroesophageal reflux disease) 12/01/2016  . Arthralgia of both knees 01/11/2016  . Hypertension  09/22/2011    Past Surgical History:  Procedure Laterality Date  . ABDOMINAL HYSTERECTOMY    . BREAST ENHANCEMENT SURGERY     40 yrs ago  . ESOPHAGEAL DILATION N/A 03/17/2017   Procedure: ESOPHAGEAL DILATION;  Surgeon: Malissa Hippo, MD;  [facility];  Service: Endoscopy;  Laterality: N/A;  . ESOPHAGOGASTRODUODENOSCOPY N/A 03/17/2017   Procedure: ESOPHAGOGASTRODUODENOSCOPY (EGD);  Surgeon: Malissa Hippo, MD;  Location: [facility]O [facility];  Service: Endoscopy;  Laterality: N/A;  10:30  . PARTIAL HYSTERECTOMY    . Tumor removed from spinal cord     benign    Current Outpatient Rx  . Order #: [id number] Class: Print  . Order #: [id number] Class: Historical Med  . Order #: [id number] Class: Normal  . Order #: [id number] Class: Normal  . Order #: [id number] Class: Fax  . Order #: [id number] Class: No Print  . Order #: [id number] Class: Normal  . Order #: [id number] Class: Normal  . Order #: [id number] Class: Normal  . Order #: [id number] Class: Fax    Allergies Codeine  Family History  Problem Relation Age of Onset  . Pulmonary embolism Mother   . Cancer Mother     mass in colon  . Stroke Father   . Alcohol abuse Father     Social History Social History  Substance Use Topics  . Smoking status: Never Smoker  . Smokeless tobacco: Never Used  . Alcohol use No    Review of Systems Constitutional: No fever/chills Eyes: No visual changes. ENT: No sore  throat. Positive for pain at her occipital area. Cardiovascular: Denies chest pain. Respiratory: Denies shortness of breath. Gastrointestinal: No abdominal pain.  No nausea, no vomiting.  No diarrhea.  No constipation. Genitourinary: Negative for dysuria. Musculoskeletal: Negative for back pain. Skin: Negative for rash. Neurological: Negative for focal weakness or numbness. Negative for syncope. Positive mild posterior HA.    10-point ROS otherwise negative.  ____________________________________________   PHYSICAL  EXAM:  VITAL SIGNS: ED Triage Vitals  Enc Vitals Group     BP 03/25/17 1049 133/84     Pulse Rate 03/25/17 1049 86     Resp 03/25/17 1049 15     Temp 03/25/17 1049 97.8 F (36.6 C)     Temp Source 03/25/17 1049 Oral     SpO2 03/25/17 1049 97 %     Weight 03/25/17 1050 174 lb (78.9 kg)     Height 03/25/17 1050 5\' 5"  (1.651 m)     Pain Score 03/25/17 1049 0   Constitutional: Alert and oriented. Well appearing and in no acute distress. Eyes: Conjunctivae are normal. PERRL.  Head: Atraumatic. Nose: No congestion/rhinnorhea. Mouth/Throat: Mucous membranes are moist.  Neck: No stridor. No cervical spine tenderness to palpation. Cardiovascular: Normal rate, regular rhythm. Good peripheral circulation. Grossly normal heart sounds.   Respiratory: Normal respiratory effort.  No retractions. Lungs CTAB. Gastrointestinal: Soft and nontender. No distention.  Musculoskeletal: No lower extremity tenderness nor edema. No gross deformities of extremities. Neurologic:  Normal speech and language. No gross focal neurologic deficits are appreciated.  Skin:  Skin is warm, dry and intact. No rash noted.  ____________________________________________  RADIOLOGY  Ct Head Wo Contrast  Result Date: 03/25/2017 CLINICAL DATA:  Fall this morning. Complaining of occipital pain. Patient is on Coumadin. EXAM: CT HEAD WITHOUT CONTRAST TECHNIQUE: Contiguous axial images were obtained from the base of the skull through the vertex without intravenous contrast. COMPARISON:  None. FINDINGS: Brain: No evidence for acute hemorrhage, mass lesion, midline shift, hydrocephalus or large infarct. Low-density in the periventricular white matter suggesting chronic changes. Focal calcification in temporal lobe bilaterally. These small calcifications are nonspecific and probably incidental findings. Vascular: No hyperdense vessel or unexpected calcification. Skull: Normal. Negative for fracture or focal lesion. Sinuses/Orbits:  Mild mucosal disease in the right sphenoid sinus. The other visualized paranasal sinuses are clear. Mastoid air cells are aerated. Other: None IMPRESSION: No acute intracranial abnormality. Evidence for chronic small vessel ischemic changes. Electronically Signed   By: Richarda Overlie M.D.   On: 03/25/2017 13:25    ____________________________________________   PROCEDURES  Procedure(s) performed:   Procedures  None ____________________________________________   INITIAL IMPRESSION / ASSESSMENT AND PLAN / ED COURSE  Pertinent labs & imaging results that were available during my care of the patient were reviewed by me and considered in my medical decision making (see chart for details).  Patient presents to the ED for evaluation after mechanical fall and head trauma. She has a mild HA. No hematoma or laceration. No c-spine tenderness. Patient is anticoagulated and with HA a CT head was obtained that was normal. Discharge with return precautions. Patient ambulatory and tolerating PO prior to ED discharge.   At this time, I do not feel there is any life-threatening condition present. I have reviewed and discussed all results (EKG, imaging, lab, urine as appropriate), exam findings with patient. I have reviewed nursing notes and appropriate previous records.  I feel the patient is safe to be discharged home without further emergent workup. Discussed usual and  customary return precautions. Patient and family (if present) verbalize understanding and are comfortable with this plan.  Patient will follow-up with their primary care provider. If they do not have a primary care provider, information for follow-up has been provided to them. All questions have been answered.   ____________________________________________  FINAL CLINICAL IMPRESSION(S) / ED DIAGNOSES  Final diagnoses:  Fall, initial encounter  Injury of head, initial encounter     MEDICATIONS GIVEN DURING THIS VISIT:  Medications - No  data to display   NEW OUTPATIENT MEDICATIONS STARTED DURING THIS VISIT:  None   Note:  This document was prepared using Dragon voice recognition software and may include unintentional dictation errors.  Alona Bene, MD Emergency Medicine  I personally performed the services described in this documentation, which was scribed in my presence. The recorded information has been reviewed and is accurate.      Maia Plan, MD 03/26/17 [phone]

## 2017-03-25 NOTE — Telephone Encounter (Signed)
Patient daughter calling.  Patient fell and bumped her head pretty good per daughter.  Would like to know what patient needs to do since she is on blood thinner.

## 2017-03-25 NOTE — Telephone Encounter (Signed)
Patient lost balance and hit back of head when she fell. Patients daughter stated a knot came up on the back of her head. Advised patients daughter that she would need to be evaluated in the emergency room especially with being on a blood thinner. Contacted patient and advised her to go to the ER. Patient stated she would go to [facility] to be evaluated.

## 2017-03-30 ENCOUNTER — Ambulatory Visit (INDEPENDENT_AMBULATORY_CARE_PROVIDER_SITE_OTHER): Payer: Medicare Other | Admitting: Cardiovascular Disease

## 2017-03-30 ENCOUNTER — Other Ambulatory Visit: Payer: Self-pay

## 2017-03-30 ENCOUNTER — Encounter: Payer: Self-pay | Admitting: Cardiovascular Disease

## 2017-03-30 VITALS — BP 110/75 | HR 60 | Ht 65.0 in | Wt 174.0 lb

## 2017-03-30 DIAGNOSIS — R5383 Other fatigue: Secondary | ICD-10-CM | POA: Diagnosis not present

## 2017-03-30 DIAGNOSIS — Z91119 Patient's noncompliance with dietary regimen due to unspecified reason: Secondary | ICD-10-CM

## 2017-03-30 DIAGNOSIS — I5033 Acute on chronic diastolic (congestive) heart failure: Secondary | ICD-10-CM | POA: Diagnosis not present

## 2017-03-30 DIAGNOSIS — Z713 Dietary counseling and surveillance: Secondary | ICD-10-CM | POA: Diagnosis not present

## 2017-03-30 DIAGNOSIS — I10 Essential (primary) hypertension: Secondary | ICD-10-CM | POA: Diagnosis not present

## 2017-03-30 DIAGNOSIS — I517 Cardiomegaly: Secondary | ICD-10-CM

## 2017-03-30 DIAGNOSIS — Z9111 Patient's noncompliance with dietary regimen: Secondary | ICD-10-CM

## 2017-03-30 DIAGNOSIS — I2699 Other pulmonary embolism without acute cor pulmonale: Secondary | ICD-10-CM | POA: Diagnosis not present

## 2017-03-30 DIAGNOSIS — I482 Chronic atrial fibrillation, unspecified: Secondary | ICD-10-CM

## 2017-03-30 MED ORDER — ATORVASTATIN CALCIUM 10 MG PO TABS: 10.0000 mg | ORAL_TABLET | Freq: Every evening | ORAL | 6 refills | Status: DC

## 2017-03-30 MED ORDER — FUROSEMIDE 40 MG PO TABS: 40.0000 mg | ORAL_TABLET | Freq: Two times a day (BID) | ORAL | 3 refills | Status: DC

## 2017-03-30 NOTE — Patient Instructions (Signed)
Your physician recommends that you schedule a follow-up appointment in: to be determined   You have been referred to A-Fib clinic with Dr Johney Frame in [place] will call you with apt.   You have been referred to Nutrition  services in [place], they will call you with pt.   DECREASE  Toprol to 100 mg daily for 3 days, and then STOP   INCREASE Lasix to 40 mg twice a day, beginning tonight.    Get lab work : BMET on Friday, 04/02/17    I have provided yo with a low sodium diet guideline      Thank you for choosing [facility] !

## 2017-03-30 NOTE — Progress Notes (Signed)
SUBJECTIVE: The patient presents for follow up of shortness of breath, palpitations, and pedal edema. She has a h/o anxiety and panic attacks and takes Xanax. I reviewed Dr. Lindaann Slough (PCP) office note dated 03/18/17. HR reportedly 72 bpm in her office.  I had increased Lasix to 40 mg bid for three days on 4/13.  She has atrial fibrillation and diastolic heart failure.  She is here with her niece.  Echocardiogram 11/30/16 performed at [facility] rocking him showed normal left ventricular systolic function, LVEF 60-65%, severe mitral and tricuspid regurgitation, and severe biatrial enlargement.  I ordered an echocardiogram for cardiomegaly performed on 02/22/17 which demonstrated normal left ventricular size and systolic function with mild LVH, LVEF 55-60%, mild aortic and mitral regurgitation, moderate tricuspid regurgitation, severe biatrial dilatation, mild to moderate right ventricular dilatation, and mildly reduced right ventricular systolic function.  Wt today 174 lbs (173 lbs 02/17/17).  She is very frustrated as she feels very fatigued. She has palpitations when lying down her left side. She has bilateral leg swelling, left greater than right. She occasionally has chest pressure when lying down on her back.  Her daughters have printed out a list of several questions that they would like answered.  She eats hot dogs with cheese and says she loves to eat salt. She requests a dietitian consult.   Review of Systems: As per "subjective", otherwise negative.  Allergies  Allergen Reactions  . Codeine Nausea Only    Current Outpatient Prescriptions  Medication Sig Dispense Refill  . ALPRAZolam (XANAX) 0.25 MG tablet Take 1 tablet (0.25 mg total) by mouth at bedtime as needed for anxiety. May repeat dose if needed (Patient taking differently: Take 0.25 mg by mouth at bedtime and may repeat dose one time if needed. May repeat dose if needed) 30 tablet 0  . atorvastatin (LIPITOR) 10 MG  tablet Take 1 tablet (10 mg total) by mouth every evening. 30 tablet 6  . carbamazepine (TEGRETOL XR) 100 MG 12 hr tablet Take 1 tablet (100 mg total) by mouth 2 (two) times daily. 60 tablet 1  . diltiazem (CARDIZEM CD) 240 MG 24 hr capsule Take 2 capsules (480 mg total) by mouth daily. 60 capsule 3  . enoxaparin (LOVENOX) 40 MG/0.4ML injection Inject 0.4 mLs (40 mg total) into the skin every 12 (twelve) hours. 10 Syringe 0  . furosemide (LASIX) 40 MG tablet Take 1 tablet (40 mg total) by mouth daily. Take an additional 40mg  with 3 lb or greater weight gain 135 tablet 1  . metoprolol succinate (TOPROL-XL) 100 MG 24 hr tablet Take 1 tablet twice daily (Patient taking differently: Take 100 mg by mouth 2 (two) times daily. ) 60 tablet 3  . pantoprazole (PROTONIX) 40 MG tablet Take 1 tablet (40 mg total) by mouth daily. 90 tablet 3  . potassium chloride (K-DUR,KLOR-CON) 10 MEQ tablet Take 1 tablet (10 mEq total) by mouth 2 (two) times daily. 60 tablet 2  . warfarin (COUMADIN) 2.5 MG tablet Take as directed by the anticoagulation clinic (Patient taking differently: Take 2.5-5 mg by mouth See admin instructions. Take 2 tablets (5 mg) on all days with the exception of Saturday & Tuesday ONLY take 1 tablet (2.5 mg)) 90 tablet 3   No current facility-administered medications for this visit.     Past Medical History:  Diagnosis Date  . Anxiety   . Atrial fibrillation with RVR (HCC) 12/01/2016  . CKD (chronic kidney disease), stage III 12/12/2016  . Depression   .  Dysphagia   . GERD (gastroesophageal reflux disease) 12/01/2016  . Heart murmur   . History of esophageal stricture 12/10/2016  . Hyperlipidemia   . Hypertension    x 4 months  . Presbyesophagus 12/11/2016  . Pulmonary emboli (HCC) 12/01/2016  . Seizures (HCC)   . Stroke [facility])     Past Surgical History:  Procedure Laterality Date  . ABDOMINAL HYSTERECTOMY    . BREAST ENHANCEMENT SURGERY     40 yrs ago  . ESOPHAGEAL DILATION N/A 03/17/2017     Procedure: ESOPHAGEAL DILATION;  Surgeon: Malissa Hippo, MD;  [facility];  Service: Endoscopy;  Laterality: N/A;  . ESOPHAGOGASTRODUODENOSCOPY N/A 03/17/2017   Procedure: ESOPHAGOGASTRODUODENOSCOPY (EGD);  Surgeon: Malissa Hippo, MD;  [facility];  Service: Endoscopy;  Laterality: N/A;  10:30  . PARTIAL HYSTERECTOMY    . 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 Marital status: Married    Spouse name: george  . Number of children: 4  . Years of education: 12   Occupational History  . retired     Neurosurgeon   Social History Main Topics  . Smoking status: Never Smoker  . Smokeless tobacco: Never Used  . Alcohol use No  . Drug use: No  . Sexual activity: Not Currently   Other Topics Concern  . Not on file   Social History Narrative   Lives with Greggory Stallion   many pets     Vitals:   03/30/17 1359  BP: 110/75  Pulse: 60  SpO2: 93%  Weight: 174 lb (78.9 kg)  Height: 5\' 5"  (1.651 m)    Wt Readings from Last 3 Encounters:  03/30/17 174 lb (78.9 kg)  03/25/17 174 lb (78.9 kg)  03/18/17 176 lb 0.6 oz (79.9 kg)     PHYSICAL EXAM General: NAD HEENT: Normal. Neck: No JVD, no thyromegaly. Lungs: Clear to auscultation bilaterally with normal respiratory effort. CV: Nondisplaced PMI.  Regular rate and irregular rhythm, normal S1/S2, no S3, no murmur. 1+ pitting left pretibial edema, trace right pretibial edema. Abdomen: Soft, nontender, no distention.  Neurologic: Alert and oriented.  Psych: Frustrated. Skin: Normal. Musculoskeletal: No gross deformities.    ECG: Most recent ECG reviewed.   Labs: Lab Results  Component Value Date/Time   K 3.8 02/19/2017 10:00 PM   BUN 23 (H) 02/19/2017 10:00 PM   CREATININE 1.45 (H) 02/19/2017 10:00 PM   ALT 51 02/19/2017 10:00 PM   TSH 2.965 12/02/2016 12:11 AM   HGB 13.3 02/19/2017 08:51 PM     Lipids: No results found for: LDLCALC, LDLDIRECT, CHOL,  TRIG, HDL     ASSESSMENT AND PLAN:  1. Rapid atrial fibrillation with marked fatigue:Heart rate is controlled on long-acting diltiazem 480 mg daily and Toprol-XL 100 mg twice daily. She is anticoagulated with warfarin.  I think the Toprol XL may be causing her fatigue. I will reduce to 100 mg daily and then stop altogether. I will make a referral to Dr. Johney Frame with the atrial fibrillation clinic to obtain his opinion about alternative strategies.  2. Bilateral pulmonary emboli:Anticoagulated with warfarin.  3. Acute on chronicdiastolic heart failure: Left leg pretibial edema greater than right. I will increase Lasix to 40 mg twice daily and obtain a basic metabolic panel in 2-3 days.  4. Hypertension: Controlled. Monitor given discontinuation of metoprolol.  5. Cardiomegaly: Echocardiogram from March 2018 detailed above with severe biatrial  enlargement and right ventricular enlargement.   Disposition: Follow up with Dr. Johney Frame in atrial fibrillation clinic in very near future. Follow up with me to be determined.  Time spent: 40 minutes, of which greater than 50% was spent reviewing symptoms, relevant blood tests and studies, and discussing management plan with the patient.   Prentice Docker, M.D., F.A.C.C.

## 2017-03-31 ENCOUNTER — Telehealth: Payer: Self-pay | Admitting: Cardiovascular Disease

## 2017-03-31 NOTE — Telephone Encounter (Signed)
Patient was instructed by Dr. Purvis Sheffield to only take Metoprolol 100mg  once a day as of yesterday 5/1. Patients daughter called this morning stating that patient accidentally took two yesterday instead of the decrease to one. Patients daughter did not want patient to just stop this abruptly since she was supposed to be weaning off of this for 3 days. Patient was to take one yesterday (5/1), one today (5/2) and one tomorrow (5/3) and then ok to stop. Since patient took two yesterday, advised daughter to have patient take one today (5/2), one tomorrow (5/3) and one Friday (5/4) and then ok to stop. Daughter verbalized understanding.

## 2017-03-31 NOTE — Telephone Encounter (Signed)
Has a question about her mom taking a medication that she was told not to yesterday after her office visit with Dr Purvis Sheffield.  She took it last night.  She has upcoming procedure she is concerned about if this will affect her.  Is she suppose to say on Furosemide at current schedule?

## 2017-04-01 ENCOUNTER — Other Ambulatory Visit: Payer: Self-pay

## 2017-04-01 ENCOUNTER — Encounter: Payer: Self-pay | Admitting: Cardiovascular Disease

## 2017-04-01 DIAGNOSIS — I482 Chronic atrial fibrillation, unspecified: Secondary | ICD-10-CM

## 2017-04-02 ENCOUNTER — Ambulatory Visit (HOSPITAL_COMMUNITY)
Admission: RE | Admit: 2017-04-02 | Discharge: 2017-04-02 | Disposition: A | Discharge status: Home / Self Care | Payer: Medicare Other | Source: Ambulatory Visit | Attending: Nurse Practitioner | Admitting: Nurse Practitioner

## 2017-04-02 ENCOUNTER — Telehealth: Payer: Self-pay | Admitting: Cardiovascular Disease

## 2017-04-02 ENCOUNTER — Encounter (HOSPITAL_COMMUNITY): Payer: Self-pay | Admitting: Nurse Practitioner

## 2017-04-02 VITALS — BP 152/92 | HR 93 | Ht 65.0 in | Wt 174.6 lb

## 2017-04-02 DIAGNOSIS — K219 Gastro-esophageal reflux disease without esophagitis: Secondary | ICD-10-CM | POA: Diagnosis not present

## 2017-04-02 DIAGNOSIS — Z86711 Personal history of pulmonary embolism: Secondary | ICD-10-CM | POA: Diagnosis not present

## 2017-04-02 DIAGNOSIS — F419 Anxiety disorder, unspecified: Secondary | ICD-10-CM | POA: Diagnosis not present

## 2017-04-02 DIAGNOSIS — Z8673 Personal history of transient ischemic attack (TIA), and cerebral infarction without residual deficits: Secondary | ICD-10-CM | POA: Insufficient documentation

## 2017-04-02 DIAGNOSIS — G40909 Epilepsy, unspecified, not intractable, without status epilepticus: Secondary | ICD-10-CM | POA: Diagnosis not present

## 2017-04-02 DIAGNOSIS — I4819 Other persistent atrial fibrillation: Secondary | ICD-10-CM

## 2017-04-02 DIAGNOSIS — I129 Hypertensive chronic kidney disease with stage 1 through stage 4 chronic kidney disease, or unspecified chronic kidney disease: Secondary | ICD-10-CM | POA: Insufficient documentation

## 2017-04-02 DIAGNOSIS — Z7901 Long term (current) use of anticoagulants: Secondary | ICD-10-CM | POA: Diagnosis not present

## 2017-04-02 DIAGNOSIS — I10 Essential (primary) hypertension: Secondary | ICD-10-CM | POA: Diagnosis not present

## 2017-04-02 DIAGNOSIS — F329 Major depressive disorder, single episode, unspecified: Secondary | ICD-10-CM | POA: Insufficient documentation

## 2017-04-02 DIAGNOSIS — I481 Persistent atrial fibrillation: Secondary | ICD-10-CM

## 2017-04-02 DIAGNOSIS — I482 Chronic atrial fibrillation, unspecified: Secondary | ICD-10-CM

## 2017-04-02 DIAGNOSIS — E785 Hyperlipidemia, unspecified: Secondary | ICD-10-CM | POA: Insufficient documentation

## 2017-04-02 DIAGNOSIS — N183 Chronic kidney disease, stage 3 (moderate): Secondary | ICD-10-CM | POA: Diagnosis not present

## 2017-04-02 LAB — BASIC METABOLIC PANEL
ANION GAP: 10 (ref 5–15)
BUN: 21 mg/dL — ABNORMAL HIGH (ref 6–20)
CHLORIDE: 104 mmol/L (ref 101–111)
CO2: 25 mmol/L (ref 22–32)
Calcium: 8.9 mg/dL (ref 8.9–10.3)
Creatinine, Ser: 1.4 mg/dL — ABNORMAL HIGH (ref 0.44–1.00)
GFR calc Af Amer: 40 mL/min — ABNORMAL LOW (ref >60)
GFR calc non Af Amer: 34 mL/min — ABNORMAL LOW (ref >60)
Glucose, Bld: 116 mg/dL — ABNORMAL HIGH (ref 65–99)
POTASSIUM: 3.6 mmol/L (ref 3.5–5.1)
Sodium: 139 mmol/L (ref 135–145)

## 2017-04-02 LAB — PROTIME-INR
INR: 3.08
Prothrombin Time: 32.5 seconds — ABNORMAL HIGH (ref 11.4–15.2)

## 2017-04-02 LAB — TSH: TSH: 4.068 u[IU]/mL (ref 0.350–4.500)

## 2017-04-02 MED ORDER — AMIODARONE HCL 200 MG PO TABS: 200.0000 mg | ORAL_TABLET | Freq: Two times a day (BID) | ORAL | 1 refills | Status: DC

## 2017-04-02 NOTE — Progress Notes (Signed)
Primary Care Physician: Eustace Moore, MD Referring Physician:Dr. [name] is a 83 y.o. female with a h/o hypertension and seizure disorder who presents in transfer from [facility] for management of atrial fibrillation with RVR. Patient states that she has been experiencing generalized weakness, fatigue with minimal daily activity. She then progressed to have exertional dyspnea, which prompted evaluation in the emergency department. On presentation to [facility] on 11/27/2016, she was in atrial fibrillation with RVR. She was initially treated with IV Cardizem. She was also given Rocephin and azithromycin for possible pneumonia. D-dimer was elevated and CT chest revealed right lower and middle lobe pulmonary emboli with evidence for heart strain. Patient continued to have rapid rates intermittently in the hospital and was eventually transferred to [facility] for cardiology evaluation. Chadsvasc score of 6.  A Fib treated with amiodarone, cardizem, metoprolol. PE treated with lovenox/coumadin and INR now therapeutic. Hospitalization was complicated by encephalopathy, weakness, decreased oral intake, nausea, vomiting, decreased urine output. She was found to have an esophageal stricture and eventually had stretching of the esophagus.She was started on IV amiodarone but this was stopped after she developed n/v.Pt thinks n/v was more related to the esophageal stricture. She was diuresed 28 lbs in the hospital.  She has continued in symptomatic  rate controlled afib. She recently c/o fatigue and Toprol was stopped. She c/o of a lot of swelling but diet is terrible for salty foods. Cardizem may be contributing. She wants to get back in SR so she will feel better. She feels good some days and others poorly. She may be having PAF.Weight is stable.   Today, she denies symptoms of palpitations, chest pain,   orthopnea, PND , dizziness, presyncope, syncope, or  neurologic sequela. C/o dyspnea, fatigue, LEE. The patient is tolerating medications without difficulties and is otherwise without complaint today.   Past Medical History:  Diagnosis Date  . Anxiety   . Atrial fibrillation with RVR (HCC) 12/01/2016  . CKD (chronic kidney disease), stage III 12/12/2016  . Depression   . Dysphagia   . GERD (gastroesophageal reflux disease) 12/01/2016  . Heart murmur   . History of esophageal stricture 12/10/2016  . Hyperlipidemia   . Hypertension    x 4 months  . Presbyesophagus 12/11/2016  . Pulmonary emboli (HCC) 12/01/2016  . Seizures (HCC)   . Stroke [facility])    Past Surgical History:  Procedure Laterality Date  . ABDOMINAL HYSTERECTOMY    . BREAST ENHANCEMENT SURGERY     40 yrs ago  . ESOPHAGEAL DILATION N/A 03/17/2017   Procedure: ESOPHAGEAL DILATION;  Surgeon: Malissa Hippo, MD;  Location: [facility] SU[facility];  Service: Endoscopy;  Laterality: N/A;  . ESOPHAGOGASTRODUODENOSCOPY N/A 03/17/2017   Procedure: ESOPHAGOGASTRODUODENOSCOPY (EGD);  Surgeon: Malissa Hippo, MD;  Location: [facility] ENDO SUITE;  Service: Endoscopy;  Laterality: N/A;  10:30  . PARTIAL HYSTERECTOMY    . Tumor removed from spinal cord     benign    Current Outpatient Prescriptions  Medication Sig Dispense Refill  . ALPRAZolam (XANAX) 0.25 MG tablet Take 1 tablet (0.25 mg total) by mouth at bedtime as needed for anxiety. May repeat dose if needed (Patient taking differently: Take 0.25 mg by mouth at bedtime and may repeat dose one time if needed. May repeat dose if needed) 30 tablet 0  . atorvastatin (LIPITOR) 10 MG tablet Take 1 tablet (10 mg total) by mouth every evening. 30 tablet 6  . carbamazepine (  TEGRETOL XR) 100 MG 12 hr tablet Take 1 tablet (100 mg total) by mouth 2 (two) times daily. 60 tablet 1  . diltiazem (CARDIZEM CD) 240 MG 24 hr capsule Take 2 capsules (480 mg total) by mouth daily. 60 capsule 3  . furosemide (LASIX) 40 MG tablet Take 1 tablet (40 mg total) by mouth 2 (two)  times daily. 180 tablet 3  . metoprolol succinate (TOPROL-XL) 100 MG 24 hr tablet Take 100 mg by mouth daily. Take with or immediately following a meal.    . pantoprazole (PROTONIX) 40 MG tablet Take 1 tablet (40 mg total) by mouth daily. 90 tablet 3  . potassium chloride (K-DUR,KLOR-CON) 10 MEQ tablet Take 1 tablet (10 mEq total) by mouth 2 (two) times daily. 60 tablet 2  . warfarin (COUMADIN) 2.5 MG tablet Take as directed by the anticoagulation clinic (Patient taking differently: Take 2.5-5 mg by mouth See admin instructions. Take 2 tablets (5 mg) on all days with the exception of Saturday & Tuesday ONLY take 1 tablet (2.5 mg)) 90 tablet 3  . amiodarone (PACERONE) 200 MG tablet Take 1 tablet (200 mg total) by mouth 2 (two) times daily. 60 tablet 1   No current facility-administered medications for this encounter.     Allergies  Allergen Reactions  . Codeine Nausea Only    Social History   Social History  . Marital status: Married    Spouse name: george  . Number of children: 4  . Years of education: 12   Occupational History  . retired     Neurosurgeon   Social History Main Topics  . Smoking status: Never Smoker  . Smokeless tobacco: Never Used  . Alcohol use No  . Drug use: No  . Sexual activity: Not Currently   Other Topics Concern  . Not on file   Social History Narrative   Lives with Greggory Stallion   many pets    Family History  Problem Relation Age of Onset  . Pulmonary embolism Mother   . Cancer Mother     mass in colon  . Stroke Father   . Alcohol abuse Father     ROS- All systems are reviewed and negative except as per the HPI above  Physical Exam: Vitals:   04/02/17 0901  BP: (!) 152/92  Pulse: 93  Weight: 174 lb 9.6 oz (79.2 kg)  Height: 5\' 5"  (1.651 m)   Wt Readings from Last 3 Encounters:  04/02/17 174 lb 9.6 oz (79.2 kg)  03/30/17 174 lb (78.9 kg)  03/25/17 174 lb (78.9 kg)    Labs: Lab Results  Component Value Date   NA 139 04/02/2017   K  3.6 04/02/2017   CL 104 04/02/2017   CO2 25 04/02/2017   GLUCOSE 116 (H) 04/02/2017   BUN 21 (H) 04/02/2017   CREATININE 1.40 (H) 04/02/2017   CALCIUM 8.9 04/02/2017   MG 2.1 12/02/2016   Lab Results  Component Value Date   INR 3.08 04/02/2017   No results found for: CHOL, HDL, LDLCALC, TRIG   GEN- The patient is well appearing, alert and oriented x 3 today.   Head- normocephalic, atraumatic Eyes-  Sclera clear, conjunctiva pink Ears- hearing intact Oropharynx- clear Neck- supple, no JVP Lymph- no cervical lymphadenopathy Lungs- Clear to ausculation bilaterally, normal work of breathing Heart- irregular rate and rhythm, no murmurs, rubs or gallops, PMI not laterally displaced GI- soft, NT, ND, + BS Extremities- no clubbing, cyanosis, or edema MS- no significant deformity  or atrophy Skin- no rash or lesion Psych- euthymic mood, full affect Neuro- strength and sensation are intact  EKG- afib at 93 bpm, qrs int 76 ms, qtc 487 ms Epic records reviewe    Assessment and Plan: 1. Persistent afib Discussed antiarrythmic options with pt  Qtc is over 440 ms and she may not be able to use tikosyn or sotalol because of this Crcl cal is 38.56 so she would not qualify for sotalol and would only qualify for lowest dose of tikosyn if qtc would be acceptable With her having issues with swelling, I prefer not to use multaq and I think cost would be prohibitive Flecainide is not best option again for renal function, with 35 being the cut off. Therefore, will try a slow load of amiodarone and hopefully she will tolerate better this time TSH today, cmet checked in March with acceptable liver function, INR today in range Last 4 INR's have been in range pft's to be scheduled Her warfarin will have to be followed closely with addition of amiodarone, have sent staff message to this effect to coumadin clinic in [facility] with PharmD and tegretol is acceptable with  amiodarone Instructed re diet, very high in salt  f/u in 10 days for EKG evaluation   [facility] [address] [place], [place] [address] [phone]

## 2017-04-02 NOTE — Telephone Encounter (Signed)
[name] (daughter) called in reference to patient's medication list.  Please call # [phone]

## 2017-04-02 NOTE — Telephone Encounter (Signed)
Daughter called to verify that metoprolol is supposed to stop today since medication list in mychart still had metoprolol listed. Nurse read instructions from 03/31/17 to daughter and confirmed with her that the last dose of metoprolol should be today and that metoprolol would be removed from her list. Daughter verbalized understanding.

## 2017-04-02 NOTE — Patient Instructions (Addendum)
Your physician has recommended you make the following change in your medication:  1)START Amiodarone 200mg  twice a day   For the PFT -- no smoking, inhalers or caffeine 4 hours prior to appointment DASH Eating Plan DASH stands for "Dietary Approaches to Stop Hypertension." The DASH eating plan is a healthy eating plan that has been shown to reduce high blood pressure (hypertension). It may also reduce your risk for type 2 diabetes, heart disease, and stroke. The DASH eating plan may also help with weight loss. What are tips for following this plan? General guidelines   Avoid eating more than 2,300 mg (milligrams) of salt (sodium) a day. If you have hypertension, you may need to reduce your sodium intake to 1,500 mg a day.  Limit alcohol intake to no more than 1 drink a day for nonpregnant women and 2 drinks a day for men. One drink equals 12 oz of beer, 5 oz of wine, or 1 oz of hard liquor.  Work with your health care provider to maintain a healthy body weight or to lose weight. Ask what an ideal weight is for you.  Get at least 30 minutes of exercise that causes your heart to beat faster (aerobic exercise) most days of the week. Activities may include walking, swimming, or biking.  Work with your health care provider or diet and nutrition specialist (dietitian) to adjust your eating plan to your individual calorie needs. Reading food labels   Check food labels for the amount of sodium per serving. Choose foods with less than 5 percent of the Daily Value of sodium. Generally, foods with less than 300 mg of sodium per serving fit into this eating plan.  To find whole grains, look for the word "whole" as the first word in the ingredient list. Shopping   Buy products labeled as "low-sodium" or "no salt added."  Buy fresh foods. Avoid canned foods and premade or frozen meals. Cooking   Avoid adding salt when cooking. Use salt-free seasonings or herbs instead of table salt or sea salt.  Check with your health care provider or pharmacist before using salt substitutes.  Do not fry foods. Cook foods using healthy methods such as baking, boiling, grilling, and broiling instead.  Cook with heart-healthy oils, such as olive, canola, soybean, or sunflower oil. Meal planning    Eat a balanced diet that includes:  5 or more servings of fruits and vegetables each day. At each meal, try to fill half of your plate with fruits and vegetables.  Up to 6-8 servings of whole grains each day.  Less than 6 oz of lean meat, poultry, or fish each day. A 3-oz serving of meat is about the same size as a deck of cards. One egg equals 1 oz.  2 servings of low-fat dairy each day.  A serving of nuts, seeds, or beans 5 times each week.  Heart-healthy fats. Healthy fats called Omega-3 fatty acids are found in foods such as flaxseeds and coldwater fish, like sardines, salmon, and mackerel.  Limit how much you eat of the following:  Canned or prepackaged foods.  Food that is high in trans fat, such as fried foods.  Food that is high in saturated fat, such as fatty meat.  Sweets, desserts, sugary drinks, and other foods with added sugar.  Full-fat dairy products.  Do not salt foods before eating.  Try to eat at least 2 vegetarian meals each week.  Eat more home-cooked food and less restaurant, buffet, and fast  food.  When eating at a restaurant, ask that your food be prepared with less salt or no salt, if possible. What foods are recommended? The items listed may not be a complete list. Talk with your dietitian about what dietary choices are best for you. Grains  Whole-grain or whole-wheat bread. Whole-grain or whole-wheat pasta. Brown rice. Orpah Cobb. Bulgur. Whole-grain and low-sodium cereals. Pita bread. Low-fat, low-sodium crackers. Whole-wheat flour tortillas. Vegetables  Fresh or frozen vegetables (raw, steamed, roasted, or grilled). Low-sodium or reduced-sodium tomato and  vegetable juice. Low-sodium or reduced-sodium tomato sauce and tomato paste. Low-sodium or reduced-sodium canned vegetables. Fruits  All fresh, dried, or frozen fruit. Canned fruit in natural juice (without added sugar). Meat and other protein foods  Skinless chicken or [place]. Ground chicken or [place]. Pork with fat trimmed off. Fish and seafood. Egg whites. Dried beans, peas, or lentils. Unsalted nuts, nut butters, and seeds. Unsalted canned beans. Lean cuts of beef with fat trimmed off. Low-sodium, lean deli meat. Dairy  Low-fat (1%) or fat-free (skim) milk. Fat-free, low-fat, or reduced-fat cheeses. Nonfat, low-sodium ricotta or cottage cheese. Low-fat or nonfat yogurt. Low-fat, low-sodium cheese. Fats and oils  Soft margarine without trans fats. Vegetable oil. Low-fat, reduced-fat, or light mayonnaise and salad dressings (reduced-sodium). Canola, safflower, olive, soybean, and sunflower oils. Avocado. Seasoning and other foods  Herbs. Spices. Seasoning mixes without salt. Unsalted popcorn and pretzels. Fat-free sweets. What foods are not recommended? The items listed may not be a complete list. Talk with your dietitian about what dietary choices are best for you. Grains  Baked goods made with fat, such as croissants, muffins, or some breads. Dry pasta or rice meal packs. Vegetables  Creamed or fried vegetables. Vegetables in a cheese sauce. Regular canned vegetables (not low-sodium or reduced-sodium). Regular canned tomato sauce and paste (not low-sodium or reduced-sodium). Regular tomato and vegetable juice (not low-sodium or reduced-sodium). Rosita Fire. Olives. Fruits  Canned fruit in a light or heavy syrup. Fried fruit. Fruit in cream or butter sauce. Meat and other protein foods  Fatty cuts of meat. Ribs. Fried meat. Tomasa Blase. Sausage. Bologna and other processed lunch meats. Salami. Fatback. Hotdogs. Bratwurst. Salted nuts and seeds. Canned beans with added salt. Canned or smoked fish. Whole  eggs or egg yolks. Chicken or [place] with skin. Dairy  Whole or 2% milk, cream, and half-and-half. Whole or full-fat cream cheese. Whole-fat or sweetened yogurt. Full-fat cheese. Nondairy creamers. Whipped toppings. Processed cheese and cheese spreads. Fats and oils  Butter. Stick margarine. Lard. Shortening. Ghee. Bacon fat. Tropical oils, such as coconut, palm kernel, or palm oil. Seasoning and other foods  Salted popcorn and pretzels. Onion salt, garlic salt, seasoned salt, table salt, and sea salt. Worcestershire sauce. Tartar sauce. Barbecue sauce. Teriyaki sauce. Soy sauce, including reduced-sodium. Steak sauce. Canned and packaged gravies. Fish sauce. Oyster sauce. Cocktail sauce. Horseradish that you find on the shelf. Ketchup. Mustard. Meat flavorings and tenderizers. Bouillon cubes. Hot sauce and Tabasco sauce. Premade or packaged marinades. Premade or packaged taco seasonings. Relishes. Regular salad dressings. Where to find more information:  National Heart, Lung, and Blood Institute: PopSteam.is  American Heart Association: www.heart.org Summary  The DASH eating plan is a healthy eating plan that has been shown to reduce high blood pressure (hypertension). It may also reduce your risk for type 2 diabetes, heart disease, and stroke.  With the DASH eating plan, you should limit salt (sodium) intake to 2,300 mg a day. If you have hypertension, you may need to  reduce your sodium intake to 1,500 mg a day.  When on the DASH eating plan, aim to eat more fresh fruits and vegetables, whole grains, lean proteins, low-fat dairy, and heart-healthy fats.  Work with your health care provider or diet and nutrition specialist (dietitian) to adjust your eating plan to your individual calorie needs. This information is not intended to replace advice given to you by your health care provider. Make sure you discuss any questions you have with your health care provider. Document Released:  11/05/2011 Document Revised: 11/09/2016 Document Reviewed: 11/09/2016 Elsevier Interactive Patient Education  2017 [employer].

## 2017-04-05 ENCOUNTER — Telehealth: Payer: Self-pay | Admitting: Family Medicine

## 2017-04-05 ENCOUNTER — Other Ambulatory Visit: Payer: Self-pay

## 2017-04-05 ENCOUNTER — Ambulatory Visit (INDEPENDENT_AMBULATORY_CARE_PROVIDER_SITE_OTHER): Payer: Medicare Other | Admitting: *Deleted

## 2017-04-05 DIAGNOSIS — Z5181 Encounter for therapeutic drug level monitoring: Secondary | ICD-10-CM

## 2017-04-05 DIAGNOSIS — Z7901 Long term (current) use of anticoagulants: Secondary | ICD-10-CM | POA: Diagnosis not present

## 2017-04-05 DIAGNOSIS — I4891 Unspecified atrial fibrillation: Secondary | ICD-10-CM

## 2017-04-05 LAB — POCT INR: INR: 3.6

## 2017-04-05 MED ORDER — ALPRAZOLAM 0.25 MG PO TABS: 0.2500 mg | ORAL_TABLET | Freq: Every evening | ORAL | 0 refills | Status: DC | PRN

## 2017-04-05 NOTE — Telephone Encounter (Signed)
Patient is requesting a refill for xanex  Pharmacy: [facility] in [place], [place]   cb#: [phone]

## 2017-04-06 ENCOUNTER — Other Ambulatory Visit: Payer: Self-pay

## 2017-04-06 ENCOUNTER — Encounter: Payer: Self-pay | Attending: Cardiovascular Disease | Admitting: Nutrition

## 2017-04-06 VITALS — Ht 65.0 in | Wt 174.0 lb

## 2017-04-06 DIAGNOSIS — I5022 Chronic systolic (congestive) heart failure: Secondary | ICD-10-CM

## 2017-04-06 DIAGNOSIS — I10 Essential (primary) hypertension: Secondary | ICD-10-CM

## 2017-04-06 MED ORDER — CARBAMAZEPINE ER 100 MG PO TB12: 100.0000 mg | ORAL_TABLET | Freq: Two times a day (BID) | ORAL | 1 refills | Status: DC

## 2017-04-06 NOTE — Progress Notes (Signed)
  Medical Nutrition Therapy:  Appt start time: 1515  end time:  1600 Assessment:  Primary concerns today: HTN and AFib and Pulmonary Emboli. LIves with her husband. Her husband does the cooking and shopping. Eats 2-3 meals per day. Eats out quite often. Had PE and AFIB in January 2018. Brought in her DASH Diet book. She admits to loving to eat salty foods. Doesn't want to take time to cook meals and so just eats quick and fast foods. Her husband cooks mostly.      She admits to eating a lot of salty foods, like  sausage biscuits, ramen noodles and processed foods. Willing to change eating habits to prevent another episode like she had in January 2018.      She is active outdoors working in yard and riding Psychologist, occupational. She reports she is better balanced now with her walking these past 4 days.   Motivated to make changes with her food choices.  Preferred Learning Style:  No preference indicated   Learning Readiness:   Ready  Change in progress   MEDICATIONS: see list   DIETARY INTAKE:  24-hr recall:  B ( AM): Sausage biscuit, coffee Snk ( AM):   L ( PM): sometimes skips, ramen noodles or sandwich, water or diet sundrop Snk ( PM):  D ( PM): Meat and vegetables, Diet Sundrop Snk ( PM): ice cream Beverages: Diet soda x 2 and water, coffee  Usual physical activity: yard work,  Estimated energy needs: 1600  calories 180 g carbohydrates 120 g protein 44 g fat  Progress Towards Goal(s):  In progress.   Nutritional Diagnosis:  NI-5.11.2 Predicted excessive nutrient intake As related to salty diet.  As evidenced by Congestive Heart Failure and AFIB..    Intervention:  Low sodium diet, reading food labels, My Plate, using Mrs. Dash and herbs/spices for cooking, meal planning and avoiding processed foods, meats and seasoning salts. .Goals 1. Follow My Plate 2. Use Mrs. Dash and other herbs and spices to seaon 3. Cut out salt and processed foods 4. Increase fresh fruits and  vegetables Read food labels. Avoid foods with more than 200 mg per serving.  Limit sodium intake to less than 2000 mg per day.   Teaching Method Utilized: Visual Auditory Hands on  Handouts given during visit include:  Low Sodium Diet My Plate Meal Plan Card  Barriers to learning/adherence to lifestyle change: none  Demonstrated degree of understanding via:  Teach Back   Monitoring/Evaluation:  Dietary intake, exercise, meal planning, , and body weight prn.

## 2017-04-06 NOTE — Patient Instructions (Signed)
Goals 1. Follow My Plate 2. Use Mrs. Dash and other herbs and spices to seaon 3. Cut out salt and processed foods 4. Increase fresh fruits and vegetables Read food labels. Avoid foods with more than 200 mg per serving.  Limit sodium intake to less than 2000 mg per day.

## 2017-04-09 ENCOUNTER — Encounter: Payer: Self-pay | Admitting: Cardiovascular Disease

## 2017-04-13 ENCOUNTER — Ambulatory Visit (INDEPENDENT_AMBULATORY_CARE_PROVIDER_SITE_OTHER): Payer: Medicare Other | Admitting: *Deleted

## 2017-04-13 ENCOUNTER — Ambulatory Visit (HOSPITAL_COMMUNITY)
Admission: RE | Admit: 2017-04-13 | Discharge: 2017-04-13 | Disposition: A | Discharge status: Home / Self Care | Payer: Medicare Other | Source: Ambulatory Visit | Attending: Nurse Practitioner | Admitting: Nurse Practitioner

## 2017-04-13 ENCOUNTER — Encounter (HOSPITAL_COMMUNITY): Payer: Self-pay | Admitting: Nurse Practitioner

## 2017-04-13 ENCOUNTER — Inpatient Hospital Stay (HOSPITAL_COMMUNITY): Admission: RE | Admit: 2017-04-13 | Payer: Medicare Other | Source: Ambulatory Visit

## 2017-04-13 VITALS — BP 126/72 | HR 121 | Ht 65.0 in | Wt 166.0 lb

## 2017-04-13 DIAGNOSIS — I4891 Unspecified atrial fibrillation: Secondary | ICD-10-CM

## 2017-04-13 DIAGNOSIS — Z7901 Long term (current) use of anticoagulants: Secondary | ICD-10-CM

## 2017-04-13 DIAGNOSIS — Z8673 Personal history of transient ischemic attack (TIA), and cerebral infarction without residual deficits: Secondary | ICD-10-CM | POA: Diagnosis not present

## 2017-04-13 DIAGNOSIS — I4819 Other persistent atrial fibrillation: Secondary | ICD-10-CM

## 2017-04-13 DIAGNOSIS — I129 Hypertensive chronic kidney disease with stage 1 through stage 4 chronic kidney disease, or unspecified chronic kidney disease: Secondary | ICD-10-CM | POA: Insufficient documentation

## 2017-04-13 DIAGNOSIS — Z79899 Other long term (current) drug therapy: Secondary | ICD-10-CM | POA: Diagnosis not present

## 2017-04-13 DIAGNOSIS — K219 Gastro-esophageal reflux disease without esophagitis: Secondary | ICD-10-CM | POA: Insufficient documentation

## 2017-04-13 DIAGNOSIS — Z5181 Encounter for therapeutic drug level monitoring: Secondary | ICD-10-CM | POA: Diagnosis not present

## 2017-04-13 DIAGNOSIS — E785 Hyperlipidemia, unspecified: Secondary | ICD-10-CM | POA: Insufficient documentation

## 2017-04-13 DIAGNOSIS — I481 Persistent atrial fibrillation: Secondary | ICD-10-CM | POA: Insufficient documentation

## 2017-04-13 DIAGNOSIS — F329 Major depressive disorder, single episode, unspecified: Secondary | ICD-10-CM | POA: Insufficient documentation

## 2017-04-13 DIAGNOSIS — F419 Anxiety disorder, unspecified: Secondary | ICD-10-CM | POA: Diagnosis not present

## 2017-04-13 DIAGNOSIS — G40909 Epilepsy, unspecified, not intractable, without status epilepticus: Secondary | ICD-10-CM | POA: Insufficient documentation

## 2017-04-13 DIAGNOSIS — N183 Chronic kidney disease, stage 3 (moderate): Secondary | ICD-10-CM | POA: Diagnosis not present

## 2017-04-13 LAB — PROTIME-INR
INR: 2.23
Prothrombin Time: 25.1 seconds — ABNORMAL HIGH (ref 11.4–15.2)

## 2017-04-13 LAB — POCT INR: INR: 2.2

## 2017-04-13 NOTE — Patient Instructions (Signed)
Take your heart rate and blood pressure over the next few days and call Mayah Urquidi on Friday with report. [phone]

## 2017-04-13 NOTE — Progress Notes (Signed)
Primary Care Physician: Eustace Moore, MD Referring Physician:Dr. Jazmari Collie is a 83 y.o. female with a h/o hypertension and seizure disorder who presents in transfer from [facility] for management of atrial fibrillation with RVR. Patient states that she has been experiencing generalized weakness, fatigue with minimal daily activity. She then progressed to have exertional dyspnea, which prompted evaluation in the emergency department. On presentation to [facility] on 11/27/2016, she was in atrial fibrillation with RVR. She was initially treated with IV Cardizem. She was also given Rocephin and azithromycin for possible pneumonia. D-dimer was elevated and CT chest revealed right lower and middle lobe pulmonary emboli with evidence for heart strain. Patient continued to have rapid rates intermittently in the hospital and was eventually transferred to [facility] for cardiology evaluation. Chadsvasc score of 6.  A Fib treated with amiodarone, cardizem, metoprolol. PE treated with lovenox/coumadin and INR now therapeutic. Hospitalization was complicated by encephalopathy, weakness, decreased oral intake, nausea, vomiting, decreased urine output. She was found to have an esophageal stricture and eventually had stretching of the esophagus.She was started on IV amiodarone but this was stopped after she developed n/v.Pt thinks n/v was more related to the esophageal stricture. She was diuresed 28 lbs in the hospital.  She has continued in symptomatic rate controlled afib. She recently c/o fatigue and Toprol was stopped. She c/o of a lot of swelling but diet is terrible for salty foods. Cardizem may be contributing. She wants to get back in SR so she will feel better. She feels good some days and others poorly. She may be having PAF.Weight is stable.  F/u in afib clinic 5/15, one week following amiodarone lad of 20 mg bid. No n/v. She thinks she feels better.  Afib still at 121 bpm, but feels improved. INR being checked weekly with addition of amiodarone. Weight is down 8 lbs and legs less edematous.   Today, she denies symptoms of palpitations, chest pain,   orthopnea, PND , dizziness, presyncope, syncope, or neurologic sequela. C/o dyspnea, fatigue, LEE. The patient is tolerating medications without difficulties and is otherwise without complaint today.   Past Medical History:  Diagnosis Date  . Anxiety   . Atrial fibrillation with RVR (HCC) 12/01/2016  . CKD (chronic kidney disease), stage III 12/12/2016  . Depression   . Dysphagia   . GERD (gastroesophageal reflux disease) 12/01/2016  . Heart murmur   . History of esophageal stricture 12/10/2016  . Hyperlipidemia   . Hypertension    x 4 months  . Presbyesophagus 12/11/2016  . Pulmonary emboli (HCC) 12/01/2016  . Seizures (HCC)   . Stroke [facility])    Past Surgical History:  Procedure Laterality Date  . ABDOMINAL HYSTERECTOMY    . BREAST ENHANCEMENT SURGERY     40 yrs ago  . ESOPHAGEAL DILATION N/A 03/17/2017   Procedure: ESOPHAGEAL DILATION;  Surgeon: Malissa Hippo, MD;  Location[facility];  Service: Endoscopy;  Laterality: N/A;  . ESOPHAGOGASTRODUODENOSCOPY N/A 03/17/2017   Procedure: ESOPHAGOGASTRODUODENOSCOPY (EGD);  Surgeon: Malissa Hippo, MD;  Location: [facility] SU[facility];  Service: Endoscopy;  Laterality: N/A;  10:30  . PARTIAL HYSTERECTOMY    . Tumor removed from spinal cord     benign    Current Outpatient Prescriptions  Medication Sig Dispense Refill  . ALPRAZolam (XANAX) 0.25 MG tablet Take 1 tablet (0.25 mg total) by mouth at bedtime as needed for anxiety. May repeat dose if needed 30 tablet 0  .  amiodarone (PACERONE) 200 MG tablet Take 1 tablet (200 mg total) by mouth 2 (two) times daily. 60 tablet 1  . atorvastatin (LIPITOR) 10 MG tablet Take 1 tablet (10 mg total) by mouth every evening. 30 tablet 6  . carbamazepine (TEGRETOL XR) 100 MG 12 hr tablet Take 1 tablet (100 mg  total) by mouth 2 (two) times daily. 60 tablet 1  . diltiazem (CARDIZEM CD) 240 MG 24 hr capsule Take 2 capsules (480 mg total) by mouth daily. 60 capsule 3  . furosemide (LASIX) 40 MG tablet Take 1 tablet (40 mg total) by mouth 2 (two) times daily. 180 tablet 3  . pantoprazole (PROTONIX) 40 MG tablet Take 1 tablet (40 mg total) by mouth daily. 90 tablet 3  . potassium chloride (K-DUR,KLOR-CON) 10 MEQ tablet Take 1 tablet (10 mEq total) by mouth 2 (two) times daily. 60 tablet 2  . warfarin (COUMADIN) 2.5 MG tablet Take as directed by the anticoagulation clinic (Patient taking differently: Take 2.5-5 mg by mouth See admin instructions. Take 2 tablets (5 mg) on all days with the exception of Saturday & Tuesday ONLY take 1 tablet (2.5 mg)) 90 tablet 3   No current facility-administered medications for this encounter.     Allergies  Allergen Reactions  . Codeine Nausea Only    Social History   Social History  . Marital status: Married    Spouse name: george  . Number of children: 4  . Years of education: 12   Occupational History  . retired     Neurosurgeon   Social History Main Topics  . Smoking status: Never Smoker  . Smokeless tobacco: Never Used  . Alcohol use No  . Drug use: No  . Sexual activity: Not Currently   Other Topics Concern  . Not on file   Social History Narrative   Lives with Greggory Stallion   many pets    Family History  Problem Relation Age of Onset  . Pulmonary embolism Mother   . Cancer Mother        mass in colon  . Stroke Father   . Alcohol abuse Father     ROS- All systems are reviewed and negative except as per the HPI above  Physical Exam: Vitals:   04/13/17 1006  BP: 126/72  Pulse: (!) 121  Weight: 166 lb (75.3 kg)  Height: 5\' 5"  (1.651 m)   Wt Readings from Last 3 Encounters:  04/13/17 166 lb (75.3 kg)  04/06/17 174 lb (78.9 kg)  04/02/17 174 lb 9.6 oz (79.2 kg)    Labs: Lab Results  Component Value Date   NA 139 04/02/2017   K 3.6  04/02/2017   CL 104 04/02/2017   CO2 25 04/02/2017   GLUCOSE 116 (H) 04/02/2017   BUN 21 (H) 04/02/2017   CREATININE 1.40 (H) 04/02/2017   CALCIUM 8.9 04/02/2017   MG 2.1 12/02/2016   Lab Results  Component Value Date   INR 2.2 04/13/2017   No results found for: CHOL, HDL, LDLCALC, TRIG   GEN- The patient is well appearing, alert and oriented x 3 today.   Head- normocephalic, atraumatic Eyes-  Sclera clear, conjunctiva pink Ears- hearing intact Oropharynx- clear Neck- supple, no JVP Lymph- no cervical lymphadenopathy Lungs- Clear to ausculation bilaterally, normal work of breathing Heart- irregular rate and rhythm, no murmurs, rubs or gallops, PMI not laterally displaced GI- soft, NT, ND, + BS Extremities- no clubbing, cyanosis, or edema MS- no significant deformity or atrophy Skin-  no rash or lesion Psych- euthymic mood, full affect Neuro- strength and sensation are intact  EKG- afib at 121 bpm, qrs int 74 ms, qtc 531 ms Epic records reviewe    Assessment and Plan: 1. Persistent afib Discussed antiarrythmic options with pt  Qtc is over 440 ms and she may not be able to use tikosyn or sotalol because of this Crcl cal is 38.56 so she would not qualify for sotalol and would only qualify for lowest dose of tikosyn if qtc would be acceptable With her having issues with swelling, I prefer not to use multaq and I think cost would be prohibitive Flecainide is not best option again for renal function, with 35 being the cut off. Therefore,pt was placed on amiodarone at 200 mg bid and is tolerating Plan on cardioversion in one month TSH today, cmet checked in March with acceptable liver function, INR today in range Last 4 INR's have been in range Pft's were missed today but rescheduled Weekly INR's, checked here and is in range. Forwarded to coumadin clinic in [employer], very high in salt I want husband to check HR/BP over the next few days, call to office  and if v rate is consistently over 100 bpm, plan to increase rate control  f/u in 2 weeks  Lupita Leash C. [facility] [address] [place], [place] [address] [phone]

## 2017-04-16 ENCOUNTER — Telehealth (HOSPITAL_COMMUNITY): Payer: Self-pay | Admitting: *Deleted

## 2017-04-16 NOTE — Telephone Encounter (Signed)
Patient BP/HR log for last several days: AM -- 118/73 HR 90 PM-- 96/59 HR 77  AM -- 112/65 Hr 88 PM -- 121/70 HR 59  AM-- 114/66 HR 97 PM--110/59 HR 76  Heart rates appear to be controlled at home and patient states she is feeling good. Will not make any up-titration of rate control meds at this time.

## 2017-04-20 ENCOUNTER — Ambulatory Visit (INDEPENDENT_AMBULATORY_CARE_PROVIDER_SITE_OTHER): Payer: Medicare Other | Admitting: *Deleted

## 2017-04-20 DIAGNOSIS — Z5181 Encounter for therapeutic drug level monitoring: Secondary | ICD-10-CM | POA: Diagnosis not present

## 2017-04-20 DIAGNOSIS — Z7901 Long term (current) use of anticoagulants: Secondary | ICD-10-CM | POA: Diagnosis not present

## 2017-04-20 DIAGNOSIS — I4891 Unspecified atrial fibrillation: Secondary | ICD-10-CM | POA: Diagnosis not present

## 2017-04-20 LAB — POCT INR: INR: 2.6

## 2017-04-28 ENCOUNTER — Encounter (HOSPITAL_COMMUNITY): Payer: Self-pay | Admitting: Nurse Practitioner

## 2017-04-28 ENCOUNTER — Ambulatory Visit (HOSPITAL_COMMUNITY)
Admission: RE | Admit: 2017-04-28 | Discharge: 2017-04-28 | Disposition: A | Discharge status: Home / Self Care | Payer: Medicare Other | Source: Ambulatory Visit | Attending: Nurse Practitioner | Admitting: Nurse Practitioner

## 2017-04-28 ENCOUNTER — Ambulatory Visit (INDEPENDENT_AMBULATORY_CARE_PROVIDER_SITE_OTHER): Payer: Medicare Other | Admitting: *Deleted

## 2017-04-28 VITALS — BP 126/74 | HR 109 | Ht 65.0 in | Wt 169.6 lb

## 2017-04-28 DIAGNOSIS — Z90710 Acquired absence of both cervix and uterus: Secondary | ICD-10-CM | POA: Insufficient documentation

## 2017-04-28 DIAGNOSIS — Z8673 Personal history of transient ischemic attack (TIA), and cerebral infarction without residual deficits: Secondary | ICD-10-CM | POA: Insufficient documentation

## 2017-04-28 DIAGNOSIS — Z79899 Other long term (current) drug therapy: Secondary | ICD-10-CM | POA: Diagnosis not present

## 2017-04-28 DIAGNOSIS — F419 Anxiety disorder, unspecified: Secondary | ICD-10-CM | POA: Insufficient documentation

## 2017-04-28 DIAGNOSIS — I4819 Other persistent atrial fibrillation: Secondary | ICD-10-CM

## 2017-04-28 DIAGNOSIS — Z7901 Long term (current) use of anticoagulants: Secondary | ICD-10-CM

## 2017-04-28 DIAGNOSIS — I4891 Unspecified atrial fibrillation: Secondary | ICD-10-CM

## 2017-04-28 DIAGNOSIS — I481 Persistent atrial fibrillation: Secondary | ICD-10-CM | POA: Diagnosis present

## 2017-04-28 DIAGNOSIS — Z808 Family history of malignant neoplasm of other organs or systems: Secondary | ICD-10-CM | POA: Insufficient documentation

## 2017-04-28 DIAGNOSIS — K219 Gastro-esophageal reflux disease without esophagitis: Secondary | ICD-10-CM | POA: Insufficient documentation

## 2017-04-28 DIAGNOSIS — Z9889 Other specified postprocedural states: Secondary | ICD-10-CM | POA: Insufficient documentation

## 2017-04-28 DIAGNOSIS — F329 Major depressive disorder, single episode, unspecified: Secondary | ICD-10-CM | POA: Diagnosis not present

## 2017-04-28 DIAGNOSIS — N183 Chronic kidney disease, stage 3 (moderate): Secondary | ICD-10-CM | POA: Insufficient documentation

## 2017-04-28 DIAGNOSIS — I129 Hypertensive chronic kidney disease with stage 1 through stage 4 chronic kidney disease, or unspecified chronic kidney disease: Secondary | ICD-10-CM | POA: Diagnosis not present

## 2017-04-28 DIAGNOSIS — Z5181 Encounter for therapeutic drug level monitoring: Secondary | ICD-10-CM

## 2017-04-28 DIAGNOSIS — Z823 Family history of stroke: Secondary | ICD-10-CM | POA: Insufficient documentation

## 2017-04-28 LAB — PULMONARY FUNCTION TEST
DL/VA % PRED: 68 %
DL/VA: 3.36 ml/min/mmHg/L
DLCO COR % PRED: 49 %
DLCO cor: 12.64 ml/min/mmHg
DLCO unc % pred: 50 %
DLCO unc: 13.01 ml/min/mmHg
FEF 25-75 POST: 1.79 L/s
FEF 25-75 Pre: 1.73 L/sec
FEF2575-%CHANGE-POST: 3 %
FEF2575-%PRED-POST: 125 %
FEF2575-%Pred-Pre: 122 %
FEV1-%CHANGE-POST: 0 %
FEV1-%PRED-POST: 90 %
FEV1-%Pred-Pre: 90 %
FEV1-Post: 1.81 L
FEV1-Pre: 1.81 L
FEV1FVC-%CHANGE-POST: 4 %
FEV1FVC-%PRED-PRE: 106 %
FEV6-%Change-Post: -4 %
FEV6-%Pred-Post: 86 %
FEV6-%Pred-Pre: 90 %
FEV6-Post: 2.21 L
FEV6-Pre: 2.3 L
FEV6FVC-%PRED-POST: 106 %
FEV6FVC-%Pred-Pre: 106 %
FVC-%CHANGE-POST: -4 %
FVC-%PRED-PRE: 85 %
FVC-%Pred-Post: 82 %
FVC-POST: 2.21 L
FVC-Pre: 2.3 L
POST FEV1/FVC RATIO: 82 %
PRE FEV1/FVC RATIO: 79 %
Post FEV6/FVC ratio: 100 %
Pre FEV6/FVC Ratio: 100 %
RV % pred: 60 %
RV: 1.49 L
TLC % pred: 73 %
TLC: 3.81 L

## 2017-04-28 LAB — BASIC METABOLIC PANEL
Anion gap: 7 (ref 5–15)
BUN: 24 mg/dL — AB (ref 6–20)
CALCIUM: 8.9 mg/dL (ref 8.9–10.3)
CO2: 27 mmol/L (ref 22–32)
CREATININE: 1.78 mg/dL — AB (ref 0.44–1.00)
Chloride: 105 mmol/L (ref 101–111)
GFR, EST AFRICAN AMERICAN: 30 mL/min — AB (ref >60)
GFR, EST NON AFRICAN AMERICAN: 26 mL/min — AB (ref >60)
Glucose, Bld: 115 mg/dL — ABNORMAL HIGH (ref 65–99)
Potassium: 4.1 mmol/L (ref 3.5–5.1)
SODIUM: 139 mmol/L (ref 135–145)

## 2017-04-28 LAB — CBC
HCT: 43.4 % (ref 36.0–46.0)
Hemoglobin: 14.4 g/dL (ref 12.0–15.0)
MCH: 31.3 pg (ref 26.0–34.0)
MCHC: 33.2 g/dL (ref 30.0–36.0)
MCV: 94.3 fL (ref 78.0–100.0)
PLATELETS: 213 10*3/uL (ref 150–400)
RBC: 4.6 MIL/uL (ref 3.87–5.11)
RDW: 14.4 % (ref 11.5–15.5)
WBC: 4.8 10*3/uL (ref 4.0–10.5)

## 2017-04-28 LAB — PROTIME-INR
INR: 2
PROTHROMBIN TIME: 23 s — AB (ref 11.4–15.2)

## 2017-04-28 MED ORDER — ALBUTEROL SULFATE (2.5 MG/3ML) 0.083% IN NEBU
2.5000 mg | INHALATION_SOLUTION | Freq: Once | RESPIRATORY_TRACT | Status: AC
  Administered 2017-04-28: 2.5 mg via RESPIRATORY_TRACT

## 2017-04-28 NOTE — Progress Notes (Signed)
Primary Care Physician: Eustace Moore, MD Referring Physician:Dr. Dreana Britz is a 83 y.o. female with a h/o, CKD, hypertension and seizure disorder who presents in transfer from [facility] for management of atrial fibrillation with RVR. Patient states that she has been experiencing generalized weakness, fatigue with minimal daily activity. She then progressed to have exertional dyspnea, which prompted evaluation in the emergency department. On presentation to [facility] on 11/27/2016, she was in atrial fibrillation with RVR. She was initially treated with IV Cardizem. She was also given Rocephin and azithromycin for possible pneumonia. D-dimer was elevated and CT chest revealed right lower and middle lobe pulmonary emboli with evidence for heart strain. Patient continued to have rapid rates intermittently in the hospital and was eventually transferred to [facility] for cardiology evaluation. Chadsvasc score of 6.  A Fib treated with amiodarone, cardizem, metoprolol. PE treated with lovenox/coumadin and INR now therapeutic. Hospitalization was complicated by encephalopathy, weakness, decreased oral intake, nausea, vomiting, decreased urine output. She was found to have an esophageal stricture and eventually had stretching of the esophagus.She was started on IV amiodarone but this was stopped after she developed n/v.Pt thinks n/v was more related to the esophageal stricture. She was diuresed 28 lbs in the hospital.  She has continued in symptomatic rate controlled afib. She recently c/o fatigue and Toprol was stopped. She c/o of a lot of swelling but diet is terrible for salty foods. Cardizem may be contributing. She wants to get back in SR so she will feel better. She feels good some days and others poorly. She may be having PAF.Weight is stable.  F/u in afib clinic 5/15, one week following amiodarone load  of 200 mg bid. No n/v. She thinks she feels  better. Afib still at 121 bpm, but feels improved. INR being checked weekly with addition of amiodarone. Weight is down 8 lbs and legs less edematous.   F/u in afib clinic 5/29, she has been loading on amiodarone since 5/4 and has had weekly therapeutic INR's. She is ready to be scheduled for cardioversion. She is due for PFT's after this appointment.  Today, she denies symptoms of palpitations, chest pain,   orthopnea, PND , dizziness, presyncope, syncope, or neurologic sequela. C/o dyspnea, fatigue, LEE. The patient is tolerating medications without difficulties and is otherwise without complaint today.   Past Medical History:  Diagnosis Date  . Anxiety   . Atrial fibrillation with RVR (HCC) 12/01/2016  . CKD (chronic kidney disease), stage III 12/12/2016  . Depression   . Dysphagia   . GERD (gastroesophageal reflux disease) 12/01/2016  . Heart murmur   . History of esophageal stricture 12/10/2016  . Hyperlipidemia   . Hypertension    x 4 months  . Presbyesophagus 12/11/2016  . Pulmonary emboli (HCC) 12/01/2016  . Seizures (HCC)   . Stroke [facility])    Past Surgical History:  Procedure Laterality Date  . ABDOMINAL HYSTERECTOMY    . BREAST ENHANCEMENT SURGERY     40 yrs ago  . ESOPHAGEAL DILATION N/A 03/17/2017   Procedure: ESOPHAGEAL DILATION;  Surgeon: Malissa Hippo, MD;  Location[facility];  Service: Endoscopy;  Laterality: N/A;  . ESOPHAGOGASTRODUODENOSCOPY N/A 03/17/2017   Procedure: ESOPHAGOGASTRODUODENOSCOPY (EGD);  Surgeon: Malissa Hippo, MD;  Location[facility];  Service: Endoscopy;  Laterality: N/A;  10:30  . PARTIAL HYSTERECTOMY    . Tumor removed from spinal cord     benign  Current Outpatient Prescriptions  Medication Sig Dispense Refill  . ALPRAZolam (XANAX) 0.25 MG tablet Take 1 tablet (0.25 mg total) by mouth at bedtime as needed for anxiety. May repeat dose if needed 30 tablet 0  . amiodarone (PACERONE) 200 MG tablet Take 1 tablet (200 mg total) by mouth 2  (two) times daily. 60 tablet 1  . atorvastatin (LIPITOR) 10 MG tablet Take 1 tablet (10 mg total) by mouth every evening. 30 tablet 6  . carbamazepine (TEGRETOL XR) 100 MG 12 hr tablet Take 1 tablet (100 mg total) by mouth 2 (two) times daily. 60 tablet 1  . diltiazem (CARDIZEM CD) 240 MG 24 hr capsule Take 2 capsules (480 mg total) by mouth daily. 60 capsule 3  . furosemide (LASIX) 40 MG tablet Take 1 tablet (40 mg total) by mouth 2 (two) times daily. 180 tablet 3  . pantoprazole (PROTONIX) 40 MG tablet Take 1 tablet (40 mg total) by mouth daily. 90 tablet 3  . potassium chloride (K-DUR,KLOR-CON) 10 MEQ tablet Take 1 tablet (10 mEq total) by mouth 2 (two) times daily. 60 tablet 2  . warfarin (COUMADIN) 2.5 MG tablet Take as directed by the anticoagulation clinic (Patient taking differently: Take 2.5-5 mg by mouth See admin instructions. Take 2 tablets (5 mg) on all days with the exception of Saturday & Tuesday ONLY take 1 tablet (2.5 mg)) 90 tablet 3   No current facility-administered medications for this encounter.     Allergies  Allergen Reactions  . Codeine Nausea Only    Social History   Social History  . Marital status: Married    Spouse name: george  . Number of children: 4  . Years of education: 12   Occupational History  . retired     Neurosurgeon   Social History Main Topics  . Smoking status: Never Smoker  . Smokeless tobacco: Never Used  . Alcohol use No  . Drug use: No  . Sexual activity: Not Currently   Other Topics Concern  . Not on file   Social History Narrative   Lives with Greggory Stallion   many pets    Family History  Problem Relation Age of Onset  . Pulmonary embolism Mother   . Cancer Mother        mass in colon  . Stroke Father   . Alcohol abuse Father     ROS- All systems are reviewed and negative except as per the HPI above  Physical Exam: Vitals:   04/28/17 0934  BP: 126/74  Pulse: (!) 109  Weight: 169 lb 9.6 oz (76.9 kg)  Height: 5\' 5"   (1.651 m)   Wt Readings from Last 3 Encounters:  04/28/17 169 lb 9.6 oz (76.9 kg)  04/13/17 166 lb (75.3 kg)  04/06/17 174 lb (78.9 kg)    Labs: Lab Results  Component Value Date   NA 139 04/02/2017   K 3.6 04/02/2017   CL 104 04/02/2017   CO2 25 04/02/2017   GLUCOSE 116 (H) 04/02/2017   BUN 21 (H) 04/02/2017   CREATININE 1.40 (H) 04/02/2017   CALCIUM 8.9 04/02/2017   MG 2.1 12/02/2016   Lab Results  Component Value Date   INR 2.00 04/28/2017   No results found for: CHOL, HDL, LDLCALC, TRIG   GEN- The patient is well appearing, alert and oriented x 3 today.   Head- normocephalic, atraumatic Eyes-  Sclera clear, conjunctiva pink Ears- hearing intact Oropharynx- clear Neck- supple, no JVP Lymph- no cervical lymphadenopathy Lungs-  Clear to ausculation bilaterally, normal work of breathing Heart- irregular rate and rhythm, no murmurs, rubs or gallops, PMI not laterally displaced GI- soft, NT, ND, + BS Extremities- no clubbing, cyanosis, or  Trace rt LLE, 1+ on rt, rubor present MS- no significant deformity or atrophy Skin- no rash or lesion Psych- euthymic mood, full affect Neuro- strength and sensation are intact  EKG- afib at 109 bpm, qrs int 74 ms, qtc 468 ms Epic records reviewe    Assessment and Plan: 1. Persistent afib Antiarrythmic options with pt per previous appointment Qtc is over 440 ms and she may not be able to use tikosyn or sotalol because of this Crcl cal is 38.56 so she would not qualify for sotalol and would only qualify for lowest dose of tikosyn if qtc would be acceptable With her having issues with swelling, I prefer not to use multaq and I think cost would be prohibitive Flecainide is not best option again for renal function, with 35 being the cut off. Therefore,pt was placed on amiodarone at 200 mg bid and has been loading over the last month Plan on cardioversion 6/4, with INR am of, risk vrs benefit discussed INR today(2.0) with cbc and  Bmet TSH and liver at baseline normal Last 4 INR's have been in range, 5/4 3.0, 5/7 3.6, 5/15 2.2, 5/22 2.6, 5/30 2.0 Pft's today Instructed re avoiding diet high in salt   f/u in 2 weeks  Lupita Leash C. [facility] [address] [place], [place] [address] [phone]

## 2017-04-28 NOTE — Patient Instructions (Addendum)
Cardioversion scheduled for Monday, June 4th  - Arrive at the [facility] and go to admitting at 8:30AM  -Do not eat or drink anything after midnight the night prior to your procedure.  - Take all your medication with a sip of water prior to arrival.  - You will not be able to drive home after your procedure.

## 2017-04-29 ENCOUNTER — Other Ambulatory Visit: Payer: Self-pay | Admitting: Cardiovascular Disease

## 2017-04-29 ENCOUNTER — Telehealth: Payer: Self-pay | Admitting: *Deleted

## 2017-04-29 NOTE — Telephone Encounter (Signed)
Spoke with daughter Sedalia Muta.  Coumadin dosage explained and she verbalized understanding.

## 2017-04-29 NOTE — Telephone Encounter (Signed)
Pt's daughter Sedalia Muta called stating they are confused about the pt's coumadin dosing instructions that were left on  voicemail, please give her a call @ [phone]

## 2017-05-03 ENCOUNTER — Encounter (HOSPITAL_COMMUNITY): Payer: Self-pay | Admitting: *Deleted

## 2017-05-03 ENCOUNTER — Ambulatory Visit (HOSPITAL_COMMUNITY)
Admission: RE | Admit: 2017-05-03 | Discharge: 2017-05-03 | Disposition: A | Discharge status: Home / Self Care | Payer: Medicare Other | Source: Ambulatory Visit | Attending: Cardiovascular Disease | Admitting: Cardiovascular Disease

## 2017-05-03 ENCOUNTER — Encounter (HOSPITAL_COMMUNITY)
Admission: RE | Disposition: A | Discharge status: Home / Self Care | Payer: Self-pay | Source: Ambulatory Visit | Attending: Cardiovascular Disease

## 2017-05-03 ENCOUNTER — Ambulatory Visit (HOSPITAL_COMMUNITY): Payer: Medicare Other | Admitting: Certified Registered"

## 2017-05-03 ENCOUNTER — Encounter (HOSPITAL_COMMUNITY): Payer: Self-pay | Admitting: Nurse Practitioner

## 2017-05-03 DIAGNOSIS — F419 Anxiety disorder, unspecified: Secondary | ICD-10-CM | POA: Insufficient documentation

## 2017-05-03 DIAGNOSIS — Z86711 Personal history of pulmonary embolism: Secondary | ICD-10-CM | POA: Insufficient documentation

## 2017-05-03 DIAGNOSIS — I481 Persistent atrial fibrillation: Secondary | ICD-10-CM | POA: Diagnosis not present

## 2017-05-03 DIAGNOSIS — Z90710 Acquired absence of both cervix and uterus: Secondary | ICD-10-CM | POA: Diagnosis not present

## 2017-05-03 DIAGNOSIS — R569 Unspecified convulsions: Secondary | ICD-10-CM | POA: Insufficient documentation

## 2017-05-03 DIAGNOSIS — E785 Hyperlipidemia, unspecified: Secondary | ICD-10-CM | POA: Diagnosis not present

## 2017-05-03 DIAGNOSIS — N183 Chronic kidney disease, stage 3 (moderate): Secondary | ICD-10-CM | POA: Insufficient documentation

## 2017-05-03 DIAGNOSIS — Z8673 Personal history of transient ischemic attack (TIA), and cerebral infarction without residual deficits: Secondary | ICD-10-CM | POA: Insufficient documentation

## 2017-05-03 DIAGNOSIS — Z7901 Long term (current) use of anticoagulants: Secondary | ICD-10-CM | POA: Insufficient documentation

## 2017-05-03 DIAGNOSIS — I129 Hypertensive chronic kidney disease with stage 1 through stage 4 chronic kidney disease, or unspecified chronic kidney disease: Secondary | ICD-10-CM | POA: Diagnosis not present

## 2017-05-03 DIAGNOSIS — R011 Cardiac murmur, unspecified: Secondary | ICD-10-CM | POA: Diagnosis not present

## 2017-05-03 DIAGNOSIS — K219 Gastro-esophageal reflux disease without esophagitis: Secondary | ICD-10-CM | POA: Diagnosis not present

## 2017-05-03 DIAGNOSIS — I4892 Unspecified atrial flutter: Secondary | ICD-10-CM | POA: Insufficient documentation

## 2017-05-03 DIAGNOSIS — F329 Major depressive disorder, single episode, unspecified: Secondary | ICD-10-CM | POA: Diagnosis not present

## 2017-05-03 DIAGNOSIS — Z79899 Other long term (current) drug therapy: Secondary | ICD-10-CM | POA: Diagnosis not present

## 2017-05-03 HISTORY — PX: CARDIOVERSION: SHX1299

## 2017-05-03 SURGERY — CARDIOVERSION
Anesthesia: General

## 2017-05-03 MED ORDER — LIDOCAINE HCL (CARDIAC) 20 MG/ML IV SOLN
INTRAVENOUS | Status: DC | PRN
  Administered 2017-05-03: 30 mg via INTRAVENOUS

## 2017-05-03 MED ORDER — SODIUM CHLORIDE 0.9 % IV SOLN
INTRAVENOUS | Status: DC
  Administered 2017-05-03: 500 mL via INTRAVENOUS

## 2017-05-03 MED ORDER — PROPOFOL 10 MG/ML IV BOLUS
INTRAVENOUS | Status: DC | PRN
  Administered 2017-05-03: 50 mg via INTRAVENOUS

## 2017-05-03 NOTE — Discharge Instructions (Signed)
Electrical Cardioversion, Care After °This sheet gives you information about how to care for yourself after your procedure. Your health care provider may also give you more specific instructions. If you have problems or questions, contact your health care provider. °What can I expect after the procedure? °After the procedure, it is common to have: °· Some redness on the skin where the shocks were given. ° °Follow these instructions at home: °· Do not drive for 24 hours if you were given a medicine to help you relax (sedative). °· Take over-the-counter and prescription medicines only as told by your health care provider. °· Ask your health care provider how to check your pulse. Check it often. °· Rest for 48 hours after the procedure or as told by your health care provider. °· Avoid or limit your caffeine use as told by your health care provider. °Contact a health care provider if: °· You feel like your heart is beating too quickly or your pulse is not regular. °· You have a serious muscle cramp that does not go away. °Get help right away if: °· You have discomfort in your chest. °· You are dizzy or you feel faint. °· You have trouble breathing or you are short of breath. °· Your speech is slurred. °· You have trouble moving an arm or leg on one side of your body. °· Your fingers or toes turn cold or blue. °This information is not intended to replace advice given to you by your health care provider. Make sure you discuss any questions you have with your health care provider. °Document Released: 09/06/2013 Document Revised: 06/19/2016 Document Reviewed: 05/22/2016 °Elsevier Interactive Patient Education © 2018 [employer] ° °

## 2017-05-03 NOTE — CV Procedure (Signed)
    Cardioversion Note  Karen Richardson [id number] Jan 25, 1934  Procedure: DC Cardioversion Indications: Atrial fib / Atrial Flutter   Procedure Details Consent: Obtained Time Out: Verified patient identification, verified procedure, site/side was marked, verified correct patient position, special equipment/implants available, Radiology Safety Procedures followed,  medications/allergies/relevent history reviewed, required imaging and test results available.  Performed  The patient has been on adequate anticoagulation.  The patient received propofol 50 mg IV  for sedation.  Synchronous cardioversion was performed at 30, 200  joules.  The cardioversion was successful     Complications: No apparent complications Patient did tolerate procedure well.   Karen Richardson, Montez Hageman., MD, [facility] 05/03/2017, 10:25 AM

## 2017-05-03 NOTE — Transfer of Care (Signed)
Immediate Anesthesia Transfer of Care Note  Patient: Karen Richardson  Procedure(s) Performed: Procedure(s): CARDIOVERSION (N/A)  Patient Location: Endoscopy Unit  Anesthesia Type:MAC  Level of Consciousness: awake, alert  and sedated  Airway & Oxygen Therapy: Patient connected to nasal cannula oxygen  Post-op Assessment: Post -op Vital signs reviewed and stable  Post vital signs: stable  Last Vitals:  Vitals:   05/03/17 0840  BP: 132/84  Resp: 13  Temp: 36.4 C    Last Pain:  Vitals:   05/03/17 0840  TempSrc: Oral         Complications: No apparent anesthesia complications

## 2017-05-03 NOTE — H&P (View-Only) (Signed)
 Primary Care Physician: Nelson, Yvonne Sue, MD Referring Physician:Dr. Konewaran   Tkeyah H Dinkel is a 83 y.o. female with a h/o, CKD, hypertension and seizure disorder who presents in transfer from [facility] for management of atrial fibrillation with RVR. Patient states that she has been experiencing generalized weakness, fatigue with minimal daily activity. She then progressed to have exertional dyspnea, which prompted evaluation in the emergency department. On presentation to [facility] on 11/27/2016, she was in atrial fibrillation with RVR. She was initially treated with IV Cardizem. She was also given Rocephin and azithromycin for possible pneumonia. D-dimer was elevated and CT chest revealed right lower and middle lobe pulmonary emboli with evidence for heart strain. Patient continued to have rapid rates intermittently in the hospital and was eventually transferred to [facility] for cardiology evaluation. Chadsvasc score of 6.  A Fib treated with amiodarone, cardizem, metoprolol. PE treated with lovenox/coumadin and INR now therapeutic. Hospitalization was complicated by encephalopathy, weakness, decreased oral intake, nausea, vomiting, decreased urine output. She was found to have an esophageal stricture and eventually had stretching of the esophagus.She was started on IV amiodarone but this was stopped after she developed n/v.Pt thinks n/v was more related to the esophageal stricture. She was diuresed 28 lbs in the hospital.  She has continued in symptomatic rate controlled afib. She recently c/o fatigue and Toprol was stopped. She c/o of a lot of swelling but diet is terrible for salty foods. Cardizem may be contributing. She wants to get back in SR so she will feel better. She feels good some days and others poorly. She may be having PAF.Weight is stable.  F/u in afib clinic 5/15, one week following amiodarone load  of 200 mg bid. No n/v. She thinks she feels  better. Afib still at 121 bpm, but feels improved. INR being checked weekly with addition of amiodarone. Weight is down 8 lbs and legs less edematous.   F/u in afib clinic 5/29, she has been loading on amiodarone since 5/4 and has had weekly therapeutic INR's. She is ready to be scheduled for cardioversion. She is due for PFT's after this appointment.  Today, she denies symptoms of palpitations, chest pain,   orthopnea, PND , dizziness, presyncope, syncope, or neurologic sequela. C/o dyspnea, fatigue, LEE. The patient is tolerating medications without difficulties and is otherwise without complaint today.   Past Medical History:  Diagnosis Date  . Anxiety   . Atrial fibrillation with RVR (HCC) 12/01/2016  . CKD (chronic kidney disease), stage III 12/12/2016  . Depression   . Dysphagia   . GERD (gastroesophageal reflux disease) 12/01/2016  . Heart murmur   . History of esophageal stricture 12/10/2016  . Hyperlipidemia   . Hypertension    x 4 months  . Presbyesophagus 12/11/2016  . Pulmonary emboli (HCC) 12/01/2016  . Seizures (HCC)   . Stroke (HCC)    Past Surgical History:  Procedure Laterality Date  . ABDOMINAL HYSTERECTOMY    . BREAST ENHANCEMENT SURGERY     40 yrs ago  . ESOPHAGEAL DILATION N/A 03/17/2017   Procedure: ESOPHAGEAL DILATION;  Surgeon: Najeeb U Rehman, MD;  [facility];  Service: Endoscopy;  Laterality: N/A;  . ESOPHAGOGASTRODUODENOSCOPY N/A 03/17/2017   Procedure: ESOPHAGOGASTRODUODENOSCOPY (EGD);  Surgeon: Najeeb U Rehman, MD;  Location: [facility];  Service: Endoscopy;  Laterality: N/A;  10:30  . PARTIAL HYSTERECTOMY    . Tumor removed from spinal cord     benign      Current Outpatient Prescriptions  Medication Sig Dispense Refill  . ALPRAZolam (XANAX) 0.25 MG tablet Take 1 tablet (0.25 mg total) by mouth at bedtime as needed for anxiety. May repeat dose if needed 30 tablet 0  . amiodarone (PACERONE) 200 MG tablet Take 1 tablet (200 mg total) by mouth 2  (two) times daily. 60 tablet 1  . atorvastatin (LIPITOR) 10 MG tablet Take 1 tablet (10 mg total) by mouth every evening. 30 tablet 6  . carbamazepine (TEGRETOL XR) 100 MG 12 hr tablet Take 1 tablet (100 mg total) by mouth 2 (two) times daily. 60 tablet 1  . diltiazem (CARDIZEM CD) 240 MG 24 hr capsule Take 2 capsules (480 mg total) by mouth daily. 60 capsule 3  . furosemide (LASIX) 40 MG tablet Take 1 tablet (40 mg total) by mouth 2 (two) times daily. 180 tablet 3  . pantoprazole (PROTONIX) 40 MG tablet Take 1 tablet (40 mg total) by mouth daily. 90 tablet 3  . potassium chloride (K-DUR,KLOR-CON) 10 MEQ tablet Take 1 tablet (10 mEq total) by mouth 2 (two) times daily. 60 tablet 2  . warfarin (COUMADIN) 2.5 MG tablet Take as directed by the anticoagulation clinic (Patient taking differently: Take 2.5-5 mg by mouth See admin instructions. Take 2 tablets (5 mg) on all days with the exception of Saturday & Tuesday ONLY take 1 tablet (2.5 mg)) 90 tablet 3   No current facility-administered medications for this encounter.     Allergies  Allergen Reactions  . Codeine Nausea Only    Social History   Social History  . Marital status: Married    Spouse name: george  . Number of children: 4  . Years of education: 12   Occupational History  . retired     hiomemaker   Social History Main Topics  . Smoking status: Never Smoker  . Smokeless tobacco: Never Used  . Alcohol use No  . Drug use: No  . Sexual activity: Not Currently   Other Topics Concern  . Not on file   Social History Narrative   Lives with [name]   many pets    Family History  Problem Relation Age of Onset  . Pulmonary embolism Mother   . Cancer Mother        mass in colon  . Stroke Father   . Alcohol abuse Father     ROS- All systems are reviewed and negative except as per the HPI above  Physical Exam: Vitals:   04/28/17 0934  BP: 126/74  Pulse: (!) 109  Weight: 169 lb 9.6 oz (76.9 kg)  Height: 5' 5"  (1.651 m)   Wt Readings from Last 3 Encounters:  04/28/17 169 lb 9.6 oz (76.9 kg)  04/13/17 166 lb (75.3 kg)  04/06/17 174 lb (78.9 kg)    Labs: Lab Results  Component Value Date   NA 139 04/02/2017   K 3.6 04/02/2017   CL 104 04/02/2017   CO2 25 04/02/2017   GLUCOSE 116 (H) 04/02/2017   BUN 21 (H) 04/02/2017   CREATININE 1.40 (H) 04/02/2017   CALCIUM 8.9 04/02/2017   MG 2.1 12/02/2016   Lab Results  Component Value Date   INR 2.00 04/28/2017   No results found for: CHOL, HDL, LDLCALC, TRIG   GEN- The patient is well appearing, alert and oriented x 3 today.   Head- normocephalic, atraumatic Eyes-  Sclera clear, conjunctiva pink Ears- hearing intact Oropharynx- clear Neck- supple, no JVP Lymph- no cervical lymphadenopathy Lungs-   Clear to ausculation bilaterally, normal work of breathing Heart- irregular rate and rhythm, no murmurs, rubs or gallops, PMI not laterally displaced GI- soft, NT, ND, + BS Extremities- no clubbing, cyanosis, or  Trace rt LLE, 1+ on rt, rubor present MS- no significant deformity or atrophy Skin- no rash or lesion Psych- euthymic mood, full affect Neuro- strength and sensation are intact  EKG- afib at 109 bpm, qrs int 74 ms, qtc 468 ms Epic records reviewe    Assessment and Plan: 1. Persistent afib Antiarrythmic options with pt per previous appointment Qtc is over 440 ms and she may not be able to use tikosyn or sotalol because of this Crcl cal is 38.56 so she would not qualify for sotalol and would only qualify for lowest dose of tikosyn if qtc would be acceptable With her having issues with swelling, I prefer not to use multaq and I think cost would be prohibitive Flecainide is not best option again for renal function, with 35 being the cut off. Therefore,pt was placed on amiodarone at 200 mg bid and has been loading over the last month Plan on cardioversion 6/4, with INR am of, risk vrs benefit discussed INR today(2.0) with cbc and  Bmet TSH and liver at baseline normal Last 4 INR's have been in range, 5/4 3.0, 5/7 3.6, 5/15 2.2, 5/22 2.6, 5/30 2.0 Pft's today Instructed re avoiding diet high in salt   f/u in 2 weeks  Selita Staiger C. Athelene Hursey, ANP-C Af[facility] [address] [place], [place] [address] [phone]  

## 2017-05-03 NOTE — Anesthesia Preprocedure Evaluation (Addendum)
Anesthesia Evaluation  Patient identified by MRN, date of birth, ID band Patient awake    Reviewed: Allergy & Precautions, NPO status , Patient's Chart, lab work & pertinent test results  Airway Mallampati: II  TM Distance: >3 FB Neck ROM: Full    Dental no notable dental hx.    Pulmonary PE   Pulmonary exam normal breath sounds clear to auscultation       Cardiovascular hypertension, Pt. on medications Normal cardiovascular exam+ dysrhythmias Atrial Fibrillation  Rhythm:Regular Rate:Normal  ECG: ST, rate 109 ECHO: - Left ventricle: The cavity size was normal. Wall thickness was increased in a pattern of mild LVH. Systolic function was normal.The estimated ejection fraction was in the range of 55% to 60%.   The study is not technically sufficient to allow evaluation of LV diastolic function. - Aortic valve: Mildly calcified annulus. Trileaflet; mildly   thickened leaflets. There was mild regurgitation. Valve area   (VTI): 1.93 cm^2. Valve area (Vmax): 2.2 cm^2. - Mitral valve: There was mild regurgitation. - Left atrium: The atrium was severely dilated. - Right ventricle: The cavity size was mildly to moderately   dilated. Systolic function was mildly reduced. - Right atrium: The atrium was severely dilated. - Tricuspid valve: There was moderate regurgitation. - Pulmonary arteries: Systolic pressure was mildly increased. PA peak pressure: 37 mm Hg (S). - Technically difficult study.   Neuro/Psych Seizures -,  Anxiety Depression CVA    GI/Hepatic Neg liver ROS, GERD  Medicated and Controlled,  Endo/Other  negative endocrine ROS  Renal/GU CRFRenal disease  negative genitourinary   Musculoskeletal negative musculoskeletal ROS (+)   Abdominal   Peds negative pediatric ROS (+)  Hematology negative hematology ROS (+)   Anesthesia Other Findings Hyperlipidemia   Reproductive/Obstetrics negative OB ROS                             Anesthesia Physical Anesthesia Plan  ASA: III  Anesthesia Plan: General   Post-op Pain Management:    Induction: Intravenous  Airway Management Planned: Mask  Additional Equipment:   Intra-op Plan:   Post-operative Plan:   Informed Consent: I have reviewed the patients History and Physical, chart, labs and discussed the procedure including the risks, benefits and alternatives for the proposed anesthesia with the patient or authorized representative who has indicated his/her understanding and acceptance.   Dental advisory given  Plan Discussed with: CRNA  Anesthesia Plan Comments:         Anesthesia Quick Evaluation

## 2017-05-03 NOTE — Interval H&P Note (Signed)
History and Physical Interval Note:  05/03/2017 8:39 AM  Karen Richardson  has presented today for surgery, with the diagnosis of AFIB  The various methods of treatment have been discussed with the patient and family. After consideration of risks, benefits and other options for treatment, the patient has consented to  Procedure(s): CARDIOVERSION (N/A) as a surgical intervention .  The patient's history has been reviewed, patient examined, no change in status, stable for surgery.  I have reviewed the patient's chart and labs.  Questions were answered to the patient's satisfaction.     Kristeen Miss

## 2017-05-03 NOTE — Anesthesia Postprocedure Evaluation (Signed)
Anesthesia Post Note  Patient: Karen Richardson  Procedure(s) Performed: Procedure(s) (LRB): CARDIOVERSION (N/A)     Patient location during evaluation: PACU Anesthesia Type: General Level of consciousness: awake and alert Pain management: pain level controlled Vital Signs Assessment: post-procedure vital signs reviewed and stable Respiratory status: spontaneous breathing, nonlabored ventilation, respiratory function stable and patient connected to nasal cannula oxygen Cardiovascular status: blood pressure returned to baseline and stable Postop Assessment: no signs of nausea or vomiting Anesthetic complications: no    Last Vitals:  Vitals:   05/03/17 1040 05/03/17 1048  BP: 122/69 137/71  Pulse: 73 75  Resp: 12 15  Temp:      Last Pain:  Vitals:   05/03/17 1029  TempSrc: Oral                 Ryan P Ellender

## 2017-05-03 NOTE — Anesthesia Procedure Notes (Signed)
Procedure Name: MAC Date/Time: 05/03/2017 10:22 AM Performed by: Lavell Luster Pre-anesthesia Checklist: Patient identified, Emergency Drugs available, Suction available, Patient being monitored and Timeout performed Patient Re-evaluated:Patient Re-evaluated prior to inductionOxygen Delivery Method: Circle system utilized Preoxygenation: Pre-oxygenation with 100% oxygen Intubation Type: IV induction Ventilation: Mask ventilation without difficulty Placement Confirmation: breath sounds checked- equal and bilateral Dental Injury: Teeth and Oropharynx as per pre-operative assessment

## 2017-05-04 ENCOUNTER — Encounter (HOSPITAL_COMMUNITY): Payer: Self-pay | Admitting: Cardiovascular Disease

## 2017-05-06 ENCOUNTER — Ambulatory Visit (INDEPENDENT_AMBULATORY_CARE_PROVIDER_SITE_OTHER): Payer: Medicare Other | Admitting: *Deleted

## 2017-05-06 DIAGNOSIS — Z5181 Encounter for therapeutic drug level monitoring: Secondary | ICD-10-CM | POA: Diagnosis not present

## 2017-05-06 DIAGNOSIS — I4891 Unspecified atrial fibrillation: Secondary | ICD-10-CM

## 2017-05-06 DIAGNOSIS — Z7901 Long term (current) use of anticoagulants: Secondary | ICD-10-CM | POA: Diagnosis not present

## 2017-05-06 LAB — POCT INR: INR: 3.4

## 2017-05-11 ENCOUNTER — Ambulatory Visit (HOSPITAL_COMMUNITY)
Admission: RE | Admit: 2017-05-11 | Discharge: 2017-05-11 | Disposition: A | Discharge status: Home / Self Care | Payer: Medicare Other | Source: Ambulatory Visit | Attending: Nurse Practitioner | Admitting: Nurse Practitioner

## 2017-05-11 ENCOUNTER — Encounter (HOSPITAL_COMMUNITY): Payer: Self-pay | Admitting: Nurse Practitioner

## 2017-05-11 VITALS — BP 142/76 | HR 69 | Ht 65.0 in | Wt 167.0 lb

## 2017-05-11 DIAGNOSIS — N183 Chronic kidney disease, stage 3 (moderate): Secondary | ICD-10-CM | POA: Diagnosis not present

## 2017-05-11 DIAGNOSIS — Z8673 Personal history of transient ischemic attack (TIA), and cerebral infarction without residual deficits: Secondary | ICD-10-CM | POA: Insufficient documentation

## 2017-05-11 DIAGNOSIS — E785 Hyperlipidemia, unspecified: Secondary | ICD-10-CM | POA: Insufficient documentation

## 2017-05-11 DIAGNOSIS — Z79899 Other long term (current) drug therapy: Secondary | ICD-10-CM | POA: Insufficient documentation

## 2017-05-11 DIAGNOSIS — I4819 Other persistent atrial fibrillation: Secondary | ICD-10-CM

## 2017-05-11 DIAGNOSIS — Z86711 Personal history of pulmonary embolism: Secondary | ICD-10-CM | POA: Diagnosis not present

## 2017-05-11 DIAGNOSIS — I129 Hypertensive chronic kidney disease with stage 1 through stage 4 chronic kidney disease, or unspecified chronic kidney disease: Secondary | ICD-10-CM | POA: Insufficient documentation

## 2017-05-11 DIAGNOSIS — F419 Anxiety disorder, unspecified: Secondary | ICD-10-CM | POA: Insufficient documentation

## 2017-05-11 DIAGNOSIS — Z7901 Long term (current) use of anticoagulants: Secondary | ICD-10-CM | POA: Insufficient documentation

## 2017-05-11 DIAGNOSIS — F329 Major depressive disorder, single episode, unspecified: Secondary | ICD-10-CM | POA: Diagnosis not present

## 2017-05-11 DIAGNOSIS — Z9889 Other specified postprocedural states: Secondary | ICD-10-CM | POA: Insufficient documentation

## 2017-05-11 DIAGNOSIS — R942 Abnormal results of pulmonary function studies: Secondary | ICD-10-CM

## 2017-05-11 DIAGNOSIS — I481 Persistent atrial fibrillation: Secondary | ICD-10-CM | POA: Diagnosis not present

## 2017-05-11 DIAGNOSIS — Z90710 Acquired absence of both cervix and uterus: Secondary | ICD-10-CM | POA: Diagnosis not present

## 2017-05-11 DIAGNOSIS — Z87898 Personal history of other specified conditions: Secondary | ICD-10-CM | POA: Insufficient documentation

## 2017-05-11 DIAGNOSIS — K219 Gastro-esophageal reflux disease without esophagitis: Secondary | ICD-10-CM | POA: Insufficient documentation

## 2017-05-11 MED ORDER — AMIODARONE HCL 200 MG PO TABS: 200.0000 mg | ORAL_TABLET | Freq: Every day | ORAL | 6 refills | Status: DC

## 2017-05-11 MED ORDER — DILTIAZEM HCL ER COATED BEADS 240 MG PO CP24: 240.0000 mg | ORAL_CAPSULE | Freq: Every day | ORAL | 6 refills | Status: DC

## 2017-05-11 NOTE — Progress Notes (Signed)
Primary Care Physician: Eustace Moore, MD Referring Physician:Dr. Aliecia Rodrick is a 83 y.o. female with a h/o, CKD, hypertension and seizure disorder who presents in transfer from [facility] for management of atrial fibrillation with RVR. Patient states that she has been experiencing generalized weakness, fatigue with minimal daily activity. She then progressed to have exertional dyspnea, which prompted evaluation in the emergency department. On presentation to [facility] on 11/27/2016, she was in atrial fibrillation with RVR. She was initially treated with IV Cardizem. She was also given Rocephin and azithromycin for possible pneumonia. D-dimer was elevated and CT chest revealed right lower and middle lobe pulmonary emboli with evidence for heart strain. Patient continued to have rapid rates intermittently in the hospital and was eventually transferred to [facility] for cardiology evaluation. Chadsvasc score of 6.  A Fib treated with amiodarone, cardizem, metoprolol. PE treated with lovenox/coumadin and INR now therapeutic. Hospitalization was complicated by encephalopathy, weakness, decreased oral intake, nausea, vomiting, decreased urine output. She was found to have an esophageal stricture and eventually had stretching of the esophagus.She was started on IV amiodarone but this was stopped after she developed n/v.Pt thinks n/v was more related to the esophageal stricture. She was diuresed 28 lbs in the hospital.  She has continued in symptomatic rate controlled afib. She recently c/o fatigue and Toprol was stopped. She c/o of a lot of swelling but diet is terrible for salty foods. Cardizem may be contributing. She wants to get back in SR so she will feel better. She feels good some days and others poorly. She may be having PAF.Weight is stable.  F/u in afib clinic 5/15, one week following amiodarone load  of 200 mg bid. No n/v. She thinks she feels  better. Afib still at 121 bpm, but feels improved. INR being checked weekly with addition of amiodarone. Weight is down 8 lbs and legs less edematous.   F/u in afib clinic 5/29, she has been loading on amiodarone since 5/4 and has had weekly therapeutic INR's. She is ready to be scheduled for cardioversion. She is due for PFT's after this appointment.  F/u in afib clinic 6/12 after successful cardioversion. She feels much better but has mild weeping of her left lower leg. May be from high dose Cardizem. Will reduce dose since she is back in rhythm.  Was told that  PFT's are abnormal and she will have to see a pulmonologist to see if she could remain on amiodarone.  Today, she denies symptoms of palpitations, chest pain,   orthopnea, PND , dizziness, presyncope, syncope, or neurologic sequela. C/o dyspnea, fatigue, LEE. The patient is tolerating medications without difficulties and is otherwise without complaint today.   Past Medical History:  Diagnosis Date  . Anxiety   . Atrial fibrillation with RVR (HCC) 12/01/2016  . CKD (chronic kidney disease), stage III 12/12/2016  . Depression   . Dysphagia   . GERD (gastroesophageal reflux disease) 12/01/2016  . Heart murmur   . History of esophageal stricture 12/10/2016  . Hyperlipidemia   . Hypertension    x 4 months  . Presbyesophagus 12/11/2016  . Pulmonary emboli (HCC) 12/01/2016  . Seizures (HCC)   . Stroke [facility])    Past Surgical History:  Procedure Laterality Date  . ABDOMINAL HYSTERECTOMY    . BREAST ENHANCEMENT SURGERY     40 yrs ago  . CARDIOVERSION N/A 05/03/2017   Procedure: CARDIOVERSION;  Surgeon: Elease Hashimoto Deloris Ping, MD;  Location: [facility];  Service: Cardiovascular;  Laterality: N/A;  . ESOPHAGEAL DILATION N/A 03/17/2017   Procedure: ESOPHAGEAL DILATION;  Surgeon: Malissa Hippo, MD;  [facility];  Service: Endoscopy;  Laterality: N/A;  . ESOPHAGOGASTRODUODENOSCOPY N/A 03/17/2017   Procedure: ESOPHAGOGASTRODUODENOSCOPY  (EGD);  Surgeon: Malissa Hippo, MD;  [facility];  Service: Endoscopy;  Laterality: N/A;  10:30  . PARTIAL HYSTERECTOMY    . Tumor removed from spinal cord     benign    Current Outpatient Prescriptions  Medication Sig Dispense Refill  . ALPRAZolam (XANAX) 0.25 MG tablet Take 1 tablet (0.25 mg total) by mouth at bedtime as needed for anxiety. May repeat dose if needed 30 tablet 0  . amiodarone (PACERONE) 200 MG tablet Take 1 tablet (200 mg total) by mouth daily. 30 tablet 6  . atorvastatin (LIPITOR) 10 MG tablet Take 1 tablet (10 mg total) by mouth every evening. 30 tablet 6  . carbamazepine (TEGRETOL XR) 100 MG 12 hr tablet Take 1 tablet (100 mg total) by mouth 2 (two) times daily. 60 tablet 1  . diltiazem (CARDIZEM CD) 240 MG 24 hr capsule Take 1 capsule (240 mg total) by mouth daily. 30 capsule 6  . furosemide (LASIX) 40 MG tablet Take 1 tablet (40 mg total) by mouth 2 (two) times daily. 180 tablet 3  . pantoprazole (PROTONIX) 40 MG tablet Take 1 tablet (40 mg total) by mouth daily. 90 tablet 3  . potassium chloride (K-DUR,KLOR-CON) 10 MEQ tablet Take 1 tablet (10 mEq total) by mouth 2 (two) times daily. 60 tablet 2  . warfarin (COUMADIN) 2.5 MG tablet Take as directed by the anticoagulation clinic (Patient taking differently: Take 2.5-5 mg by mouth See admin instructions. Take 2 tablets (5 mg) on all days with the exception of Saturday & Tuesday ONLY take 1 tablet (2.5 mg)) 90 tablet 3   No current facility-administered medications for this encounter.     Allergies  Allergen Reactions  . Codeine Nausea Only    Social History   Social History  . Marital status: Married    Spouse name: george  . Number of children: 4  . Years of education: 12   Occupational History  . retired     Neurosurgeon   Social History Main Topics  . Smoking status: Never Smoker  . Smokeless tobacco: Never Used  . Alcohol use No  . Drug use: No  . Sexual activity: Not Currently    Other Topics Concern  . Not on file   Social History Narrative   Lives with Greggory Stallion   many pets    Family History  Problem Relation Age of Onset  . Pulmonary embolism Mother   . Cancer Mother        mass in colon  . Stroke Father   . Alcohol abuse Father     ROS- All systems are reviewed and negative except as per the HPI above  Physical Exam: Vitals:   05/11/17 1113  BP: (!) 142/76  Pulse: 69  Weight: 167 lb (75.8 kg)  Height: 5\' 5"  (1.651 m)   Wt Readings from Last 3 Encounters:  05/11/17 167 lb (75.8 kg)  04/28/17 169 lb 9.6 oz (76.9 kg)  04/13/17 166 lb (75.3 kg)    Labs: Lab Results  Component Value Date   NA 139 04/28/2017   K 4.1 04/28/2017   CL 105 04/28/2017   CO2 27 04/28/2017   GLUCOSE 115 (H) 04/28/2017  BUN 24 (H) 04/28/2017   CREATININE 1.78 (H) 04/28/2017   CALCIUM 8.9 04/28/2017   MG 2.1 12/02/2016   Lab Results  Component Value Date   INR 3.4 05/06/2017   No results found for: CHOL, HDL, LDLCALC, TRIG   GEN- The patient is well appearing, alert and oriented x 3 today.   Head- normocephalic, atraumatic Eyes-  Sclera clear, conjunctiva pink Ears- hearing intact Oropharynx- clear Neck- supple, no JVP Lymph- no cervical lymphadenopathy Lungs- Clear to ausculation bilaterally, normal work of breathing Heart- irregular rate and rhythm, no murmurs, rubs or gallops, PMI not laterally displaced GI- soft, NT, ND, + BS Extremities- no clubbing, cyanosis, or  Trace rt LLE, 1+ on left, with mild weeping,  rubor present MS- no significant deformity or atrophy Skin- no rash or lesion Psych- euthymic mood, full affect Neuro- strength and sensation are intact  EKG- SR at 69 bpm, pr int 174 ms, qrs int 74 ms, qtc 522 ms Epic records reviewed PFT's Conclusions: The reduced lung volumes, increased FEV1/FVC ratio and diffusion defect suggest an interstitial process such as fibrosis or interstitial inflammation. In view of the severity of the  diffusion defect, studies with exercise would be helpful to evaluate the presence of hypoxemia. Pulmonary Function Diagnosis: Minimal Restriction -Interstitial Moderately severe Diffusion Defect   Assessment and Plan: 1. Persistent afib Loading of amiodarone with successful cardioversion 6/4 Feels much improved However, baseline PFT's abnormal and she has been given an appointment with pulmonology in [place] to see if amiodarone can be continued Reduce amiodarone to 200 mg daily Reduce Cardizem to 240 mg daily from 480 mg daily, now in SR, to see if LLE can be improved Continue warfarin, with chadsvasc score of at least 4, with close f/u with coumadin clinic in [place] since dose of amiodarone is being reduced, next appointment 6/14  F/u with Dr. Purvis Sheffield as scheduled 6/26 afib clinic in 6 months   Lupita Leash C. Matthew Folks [facility] [address] [place], [place] [address] [phone]

## 2017-05-11 NOTE — Patient Instructions (Signed)
Pulmonary will be in touch with you regarding consultation appointment.  Your physician has recommended you make the following change in your medication:   1)Decrease Amiodarone to 200mg  ONCE A DAY  2)Decrease Cardizem (Diltiazem) to 240mg  ONCE A DAY  Keep legs elevated when sitting until swelling improves.

## 2017-05-13 ENCOUNTER — Ambulatory Visit (INDEPENDENT_AMBULATORY_CARE_PROVIDER_SITE_OTHER): Payer: Medicare Other | Admitting: *Deleted

## 2017-05-13 DIAGNOSIS — Z5181 Encounter for therapeutic drug level monitoring: Secondary | ICD-10-CM

## 2017-05-13 DIAGNOSIS — I4891 Unspecified atrial fibrillation: Secondary | ICD-10-CM

## 2017-05-13 DIAGNOSIS — Z7901 Long term (current) use of anticoagulants: Secondary | ICD-10-CM

## 2017-05-13 LAB — POCT INR: INR: 3.1

## 2017-05-18 ENCOUNTER — Other Ambulatory Visit: Payer: Self-pay | Admitting: Cardiovascular Disease

## 2017-05-18 ENCOUNTER — Other Ambulatory Visit: Payer: Self-pay

## 2017-05-18 MED ORDER — CARBAMAZEPINE ER 100 MG PO TB12: 100.0000 mg | ORAL_TABLET | Freq: Two times a day (BID) | ORAL | 1 refills | Status: DC

## 2017-05-20 ENCOUNTER — Ambulatory Visit (INDEPENDENT_AMBULATORY_CARE_PROVIDER_SITE_OTHER): Payer: Medicare Other | Admitting: *Deleted

## 2017-05-20 DIAGNOSIS — Z7901 Long term (current) use of anticoagulants: Secondary | ICD-10-CM

## 2017-05-20 DIAGNOSIS — I4891 Unspecified atrial fibrillation: Secondary | ICD-10-CM

## 2017-05-20 DIAGNOSIS — Z5181 Encounter for therapeutic drug level monitoring: Secondary | ICD-10-CM

## 2017-05-20 LAB — POCT INR: INR: 3.1

## 2017-05-25 ENCOUNTER — Ambulatory Visit: Payer: Medicare Other | Admitting: Cardiovascular Disease

## 2017-06-01 ENCOUNTER — Ambulatory Visit (INDEPENDENT_AMBULATORY_CARE_PROVIDER_SITE_OTHER): Payer: Medicare Other | Admitting: *Deleted

## 2017-06-01 DIAGNOSIS — Z5181 Encounter for therapeutic drug level monitoring: Secondary | ICD-10-CM

## 2017-06-01 DIAGNOSIS — Z7901 Long term (current) use of anticoagulants: Secondary | ICD-10-CM

## 2017-06-01 DIAGNOSIS — I4891 Unspecified atrial fibrillation: Secondary | ICD-10-CM

## 2017-06-01 LAB — POCT INR: INR: 2.5

## 2017-06-17 ENCOUNTER — Ambulatory Visit (INDEPENDENT_AMBULATORY_CARE_PROVIDER_SITE_OTHER): Payer: Medicare Other | Admitting: Family Medicine

## 2017-06-17 ENCOUNTER — Other Ambulatory Visit (HOSPITAL_COMMUNITY)
Admission: RE | Admit: 2017-06-17 | Discharge: 2017-06-17 | Disposition: A | Discharge status: Home / Self Care | Payer: Medicare Other | Source: Ambulatory Visit | Attending: Family Medicine | Admitting: Family Medicine

## 2017-06-17 ENCOUNTER — Encounter: Payer: Self-pay | Admitting: Family Medicine

## 2017-06-17 VITALS — BP 118/74 | HR 72 | Temp 97.6°F | Resp 16 | Ht 65.0 in | Wt 170.1 lb

## 2017-06-17 DIAGNOSIS — M25562 Pain in left knee: Secondary | ICD-10-CM

## 2017-06-17 DIAGNOSIS — E559 Vitamin D deficiency, unspecified: Secondary | ICD-10-CM | POA: Insufficient documentation

## 2017-06-17 DIAGNOSIS — I4891 Unspecified atrial fibrillation: Secondary | ICD-10-CM | POA: Insufficient documentation

## 2017-06-17 DIAGNOSIS — M25561 Pain in right knee: Secondary | ICD-10-CM | POA: Diagnosis not present

## 2017-06-17 DIAGNOSIS — I10 Essential (primary) hypertension: Secondary | ICD-10-CM | POA: Diagnosis not present

## 2017-06-17 DIAGNOSIS — K219 Gastro-esophageal reflux disease without esophagitis: Secondary | ICD-10-CM

## 2017-06-17 LAB — BASIC METABOLIC PANEL
Anion gap: 9 (ref 5–15)
BUN: 26 mg/dL — ABNORMAL HIGH (ref 6–20)
CALCIUM: 8.9 mg/dL (ref 8.9–10.3)
CO2: 27 mmol/L (ref 22–32)
CREATININE: 1.79 mg/dL — AB (ref 0.44–1.00)
Chloride: 102 mmol/L (ref 101–111)
GFR calc Af Amer: 29 mL/min — ABNORMAL LOW (ref >60)
GFR calc non Af Amer: 25 mL/min — ABNORMAL LOW (ref >60)
GLUCOSE: 106 mg/dL — AB (ref 65–99)
Potassium: 4.1 mmol/L (ref 3.5–5.1)
Sodium: 138 mmol/L (ref 135–145)

## 2017-06-17 MED ORDER — FUROSEMIDE 40 MG PO TABS: 40.0000 mg | ORAL_TABLET | Freq: Every day | ORAL | 3 refills | Status: DC

## 2017-06-17 NOTE — Progress Notes (Signed)
Chief Complaint  Patient presents with  . Follow-up    3 month   Feels well Medicines slowly being reduced Very little swelling ankles SOB improved Is here with daughter Karen Richardson Wants to know why she is going to pulmonary med.  Described abnormal PFT No palpitations Energy is better Appetite too good Weight is stable   Patient Active Problem List   Diagnosis Date Noted  . Persistent atrial fibrillation (HCC)   . Esophageal dysphagia 02/03/2017  . Dizziness 01/07/2017  . Monitoring for long-term anticoagulant use 12/18/2016  . CKD (chronic kidney disease), stage III 12/12/2016  . Seizure disorder (HCC) 12/12/2016  . Presbyesophagus 12/11/2016  . History of esophageal stricture 12/10/2016  . Dysphagia   . AKI (acute kidney injury) (HCC)   . Atrial fibrillation with RVR (HCC) 12/01/2016  . Pulmonary emboli (HCC) 12/01/2016  . GERD (gastroesophageal reflux disease) 12/01/2016  . Arthralgia of both knees 01/11/2016  . Hypertension 09/22/2011    Outpatient Encounter Prescriptions as of 06/17/2017  Medication Sig  . amiodarone (PACERONE) 200 MG tablet Take 1 tablet (200 mg total) by mouth daily.  Marland Kitchen atorvastatin (LIPITOR) 10 MG tablet Take 1 tablet (10 mg total) by mouth every evening.  . carbamazepine (TEGRETOL XR) 100 MG 12 hr tablet Take 1 tablet (100 mg total) by mouth 2 (two) times daily.  Marland Kitchen diltiazem (CARDIZEM CD) 240 MG 24 hr capsule Take 1 capsule (240 mg total) by mouth daily.  . furosemide (LASIX) 40 MG tablet Take 1 tablet (40 mg total) by mouth daily. Take second if needed (Patient taking differently: Take 40 mg by mouth. Take second if needed)  . pantoprazole (PROTONIX) 40 MG tablet Take 1 tablet (40 mg total) by mouth daily.  . potassium chloride (K-DUR,KLOR-CON) 10 MEQ tablet TAKE ONE TABLET BY MOUTH TWICE DAILY. (MORNING ,BEDTIME)  . warfarin (COUMADIN) 2.5 MG tablet Take as directed by the anticoagulation clinic (Patient taking differently: Take 2.5-5 mg by  mouth See admin instructions. Take 2 tablets (5 mg) on all days with the exception of Saturday & Tuesday ONLY take 1 tablet (2.5 mg))   No facility-administered encounter medications on file as of 06/17/2017.     Allergies  Allergen Reactions  . Codeine Nausea Only    Review of Systems  Constitutional: Negative for activity change, appetite change, fatigue and unexpected weight change.  HENT: Negative for congestion, dental problem, postnasal drip, rhinorrhea and trouble swallowing.   Eyes: Negative for photophobia, redness and visual disturbance.  Respiratory: Negative for cough and shortness of breath.   Cardiovascular: Positive for leg swelling. Negative for chest pain and palpitations.  Gastrointestinal: Negative for abdominal distention, abdominal pain, constipation and diarrhea.  Genitourinary: Positive for frequency. Negative for difficulty urinating and vaginal bleeding.  Musculoskeletal: Negative for arthralgias and back pain.  Neurological: Negative for dizziness and headaches.  Psychiatric/Behavioral: Negative for dysphoric mood and sleep disturbance. The patient is not nervous/anxious.     BP 118/74 (BP Location: Right Arm, Patient Position: Sitting, Cuff Size: Normal)   Pulse 72   Temp 97.6 F (36.4 C) (Temporal)   Resp 16   Ht 5\' 5"  (1.651 m)   Wt 170 lb 1.3 oz (77.1 kg)   SpO2 96%   BMI 28.30 kg/m   Physical Exam  Constitutional: She is oriented to person, place, and time. She appears well-developed and well-nourished.  HENT:  Head: Normocephalic and atraumatic.  Mouth/Throat: Oropharynx is clear and moist.  Eyes: Pupils are equal, round, and  reactive to light. Conjunctivae are normal.  Neck: Normal range of motion. Neck supple. No thyromegaly present.  Cardiovascular: Normal rate, regular rhythm and normal heart sounds.   Heart reg with occas ectopy  Pulmonary/Chest: Effort normal and breath sounds normal. No respiratory distress. She has no rales.  clear    Abdominal: Soft. Bowel sounds are normal.  Musculoskeletal: Normal range of motion. She exhibits edema.  trace  Lymphadenopathy:    She has no cervical adenopathy.  Neurological: She is alert and oriented to person, place, and time.  Gait normal  Skin: Skin is warm and dry.  Skin dry, ankles hyperpigmented  Psychiatric: She has a normal mood and affect. Her behavior is normal. Thought content normal.  Nursing note and vitals reviewed.   ASSESSMENT/PLAN:  1. Atrial fibrillation with RVR (HCC) - Basic Metabolic Panel (BMET)  2. Gastroesophageal reflux disease, esophagitis presence not specified  3. Essential hypertension  4. Arthralgia of both knees  5. Vitamin D deficiency Complains of poor balance - VITAMIN D 25 Hydroxy (Vit-D Deficiency, Fractures)   Patient Instructions  Stay as active as you can manage Try to drink more water I do recommend moisturizing the legs/skin  No change in medicines  Come back in 3 months for shot updates Call sooner for problems  Take the lasix (furosemide) once a day in the morning Take a second lasix ( fluid pill ) if you do not have extra fluid Take the potassium only with the lasix  Labs today I will notify you of your test results    Eustace Moore, MD

## 2017-06-17 NOTE — Patient Instructions (Addendum)
Stay as active as you can manage Try to drink more water I do recommend moisturizing the legs/skin  No change in medicines  Come back in 3 months for shot updates Call sooner for problems  Take the lasix (furosemide) once a day in the morning Take a second lasix ( fluid pill ) if you do not have extra fluid Take the potassium only with the lasix  Labs today I will notify you of your test results

## 2017-06-18 ENCOUNTER — Other Ambulatory Visit: Payer: Self-pay | Admitting: Family Medicine

## 2017-06-18 LAB — VITAMIN D 25 HYDROXY (VIT D DEFICIENCY, FRACTURES): VIT D 25 HYDROXY: 16.7 ng/mL — AB (ref 30.0–100.0)

## 2017-06-18 MED ORDER — VITAMIN D (ERGOCALCIFEROL) 1.25 MG (50000 UNIT) PO CAPS: 50000.0000 [IU] | ORAL_CAPSULE | ORAL | 0 refills | Status: DC

## 2017-06-29 ENCOUNTER — Ambulatory Visit (INDEPENDENT_AMBULATORY_CARE_PROVIDER_SITE_OTHER): Payer: Medicare Other | Admitting: *Deleted

## 2017-06-29 DIAGNOSIS — Z5181 Encounter for therapeutic drug level monitoring: Secondary | ICD-10-CM | POA: Diagnosis not present

## 2017-06-29 DIAGNOSIS — Z7901 Long term (current) use of anticoagulants: Secondary | ICD-10-CM | POA: Diagnosis not present

## 2017-06-29 DIAGNOSIS — I4891 Unspecified atrial fibrillation: Secondary | ICD-10-CM | POA: Diagnosis not present

## 2017-06-29 LAB — POCT INR: INR: 2.7

## 2017-07-02 ENCOUNTER — Ambulatory Visit (INDEPENDENT_AMBULATORY_CARE_PROVIDER_SITE_OTHER): Payer: Medicare Other | Admitting: Pulmonary Disease

## 2017-07-02 ENCOUNTER — Encounter: Payer: Self-pay | Admitting: Pulmonary Disease

## 2017-07-02 VITALS — BP 120/62 | HR 67 | Ht 65.5 in | Wt 160.6 lb

## 2017-07-02 DIAGNOSIS — R911 Solitary pulmonary nodule: Secondary | ICD-10-CM | POA: Diagnosis not present

## 2017-07-02 DIAGNOSIS — I2699 Other pulmonary embolism without acute cor pulmonale: Secondary | ICD-10-CM

## 2017-07-02 DIAGNOSIS — IMO0001 Reserved for inherently not codable concepts without codable children: Secondary | ICD-10-CM

## 2017-07-02 DIAGNOSIS — I2729 Other secondary pulmonary hypertension: Secondary | ICD-10-CM | POA: Diagnosis not present

## 2017-07-02 DIAGNOSIS — J984 Other disorders of lung: Secondary | ICD-10-CM

## 2017-07-02 HISTORY — DX: Other secondary pulmonary hypertension: I27.29

## 2017-07-02 NOTE — Progress Notes (Signed)
Subjective:    Patient ID: Karen Richardson, female    DOB: 04-Mar-1934, 83 y.o.   MRN: [id number]  HPI The patient reports she was hospitalized in [place] with Pulmonary Emboli. These were in her right lower and middle lobe arteries with right heart strain. She was started on Coumadin with a Lovenox bridge. She was discharged on 12/14/16 per the EMR. She was also hospitalized with atrial fibrillation with rapid ventricular response. She underwent cardioversion on 6/4. She has been on Amiodarone for an unclear amount of time and per the EMR was intolerant of IV Amiodarone with nausea & emesis. She has been on it at least since May. She reports her dyspnea is improving. She denies any palpitations. No chest tightness or pain. No fever, chills, or sweats. No headaches. No recent nausea or emesis. She reports she underwent an EGD and dilation for dysphagia. She denies any coughing or wheezing. Denies any periods of immobility, trauma, or long trips preceding her pulmonary emboli. She denies any prior clots, PEs or DVTs.   Review of Systems No bruising but does have increased erythema and bruising over her lower extremities. No abdominal pain or nausea. A pertinent 14 point review of systems is negative except as per the history of presenting illness.  Allergies  Allergen Reactions  . Codeine Nausea Only    Current Outpatient Prescriptions on File Prior to Visit  Medication Sig Dispense Refill  . amiodarone (PACERONE) 200 MG tablet Take 1 tablet (200 mg total) by mouth daily. 30 tablet 6  . atorvastatin (LIPITOR) 10 MG tablet Take 1 tablet (10 mg total) by mouth every evening. 30 tablet 6  . carbamazepine (TEGRETOL XR) 100 MG 12 hr tablet Take 1 tablet (100 mg total) by mouth 2 (two) times daily. 60 tablet 1  . diltiazem (CARDIZEM CD) 240 MG 24 hr capsule Take 1 capsule (240 mg total) by mouth daily. 30 capsule 6  . furosemide (LASIX) 40 MG tablet Take 1 tablet (40 mg total) by mouth daily. Take second  if needed (Patient taking differently: Take 40 mg by mouth. Take second if needed) 180 tablet 3  . pantoprazole (PROTONIX) 40 MG tablet Take 1 tablet (40 mg total) by mouth daily. 90 tablet 3  . potassium chloride (K-DUR,KLOR-CON) 10 MEQ tablet TAKE ONE TABLET BY MOUTH TWICE DAILY. (MORNING ,BEDTIME) 60 tablet 3  . Vitamin D, Ergocalciferol, (DRISDOL) 50000 units CAPS capsule Take 1 capsule (50,000 Units total) by mouth every 7 (seven) days. 12 capsule 0  . warfarin (COUMADIN) 2.5 MG tablet Take as directed by the anticoagulation clinic (Patient taking differently: Take 2.5-5 mg by mouth See admin instructions. Take 2 tablets (5 mg) on all days with the exception of Saturday & Tuesday ONLY take 1 tablet (2.5 mg)) 90 tablet 3   No current facility-administered medications on file prior to visit.     Past Medical History:  Diagnosis Date  . Anxiety   . Atrial fibrillation with RVR (HCC) 12/01/2016  . CKD (chronic kidney disease), stage III 12/12/2016  . Depression   . Dysphagia   . GERD (gastroesophageal reflux disease) 12/01/2016  . Heart murmur   . History of esophageal stricture 12/10/2016  . Hyperlipidemia   . Hypertension    x 4 months  . Presbyesophagus 12/11/2016  . Pulmonary emboli (HCC) 12/01/2016  . Pulmonary embolism (HCC)   . Pulmonary nodule   . Seizures (HCC)   . Stroke [facility])     Past Surgical History:  Procedure Laterality Date  . ABDOMINAL HYSTERECTOMY    . BREAST ENHANCEMENT SURGERY     40 yrs ago  . CARDIOVERSION N/A 05/03/2017   Procedure: CARDIOVERSION;  Surgeon: Elease Hashimoto Deloris Ping, MD;  Location: [facility] ENDOSCOPY;  Service: Cardiovascular;  Laterality: N/A;  . ESOPHAGEAL DILATION N/A 03/17/2017   Procedure: ESOPHAGEAL DILATION;  Surgeon: Malissa Hippo, MD;  Location: [facility] ENDO SUITE;  Service: Endoscopy;  Laterality: N/A;  . ESOPHAGOGASTRODUODENOSCOPY N/A 03/17/2017   Procedure: ESOPHAGOGASTRODUODENOSCOPY (EGD);  Surgeon: Malissa Hippo, MD;  Location: [facility] ENDO SUITE;   Service: Endoscopy;  Laterality: N/A;  10:30  . PARTIAL HYSTERECTOMY    . SPINE SURGERY     Tumor removal  . Tumor removed from spinal cord     benign    Family History  Problem Relation Age of Onset  . Pulmonary embolism Mother   . Cancer Mother        mass in colon  . Stroke Father   . Alcohol abuse Father   . Diabetes Sister   . Heart failure Sister   . Brain cancer Sister   . Schizophrenia Brother   . Lung disease Neg Hx     Social History   Social History  . Marital status: Married    Spouse name: george  . Number of children: 4  . Years of education: 12   Occupational History  . retired     Neurosurgeon   Social History Main Topics  . Smoking status: Passive Smoke Exposure - Never Smoker    Types: Cigarettes  . Smokeless tobacco: Never Used     Comment: Father as a child   . Alcohol use No  . Drug use: No  . Sexual activity: Not Currently   Other Topics Concern  . None   Social History Narrative   Lives with Greggory Stallion   many pets      [facility] (07/02/17):   Originally from [facility]. She has lived in [place], [place], [place]. Previously has been a homemaker primarily. Currently has 5 dogs & 2 cats. No bird or mold exposure.       Objective:   Physical Exam BP 120/62 (BP Location: Right Arm, Patient Position: Sitting, Cuff Size: Normal)   Pulse 67   Ht 5' 5.5" (1.664 m)   Wt 160 lb 9.6 oz (72.8 kg)   SpO2 95%   BMI 26.32 kg/m  General:  Awake. Alert. No acute distress. Accompanied by husband today. Integument:  Warm & dry. No rash on exposed skin.  Extremities:  No cyanosis or clubbing.  Lymphatics:  No appreciated cervical or supraclavicular lymphadenoapthy. HEENT:  Moist mucus membranes. No oral ulcers. Minimal nasal turbinate swelling. No scleral icterus. Cardiovascular:  Regular rate. No edema. Varicosities noted in lower extremities.  Pulmonary:  Good aeration & clear to auscultation bilaterally. Symmetric chest wall expansion. No accessory muscle  use on room air. Abdomen: Soft. Normal bowel sounds. Nondistended. Grossly nontender. Musculoskeletal:  Normal bulk and tone. Hand grip strength 5/5 bilaterally. No joint effusion appreciated. Synovial thickening bilateral PIP & DIP joints. Neurological:  CN 2-12 grossly in tact. No meningismus. Moving all 4 extremities equally. Symmetric brachioradialis deep tendon reflexes. Psychiatric:  Mood and affect congruent. Speech normal rhythm, rate & tone.   PFT 04/28/17: FVC 2.30 L (85%) FEV1 1.81 L (90%) FEV1/FVC 0.79 FEF 25-75 1.73 L (122%) negative bronchodilator response TLC 3.1 L (73%) RV 60% ERV 60% DLCO uncorrected 49%  IMAGING CXR PA/LAT 02/19/17 (personally reviewed  by me):  Approximately 2 cm nodule present on lateral view. Cardiomegaly without pleural effusion noted. Some flattening of the diaphragms suggestive of hyperinflation. Mediastinum normal in contour.  CARDIAC TTE (02/22/17):  LV normal in size with mild LVH. EF 55-60%. Images inadequate to assess diastolic function or wall motion. LA & RA severely dilated. RV mild to moderately dilated with mildly reduced systolic function. Mild aortic regurgitation without stenosis. Aortic root normal in size. Mild mitral regurgitation without stenosis. No significant pulmonic regurgitation or stenosis with poorly visualized. Moderate tricuspid regurgitation. No pericardial effusion.  LABS 12/06/16 ABG on 2 L/m:  7.362 / 45.2 / 76.4 / 93.3    Assessment & Plan:  83 y.o. female previously diagnosed with pulmonary embolism earlier this year. This appears to be unprovoked pulmonary emboli. Reviewing the patient's previous imaging does show a 2 cm nodule on lateral view from her March chest x-ray which was not present on previous CT imaging some years ago. I do not have access to the CT angiogram that previously diagnosed her pulmonary emboli. Reviewing her pulmonary function testing does show a moderate reduction in her carbon monoxide capacity as well  as mild restriction. This can certainly be due to her previous pulmonary embolism as well as cardiomegaly and congestive heart failure. She does not have other symptoms that would suggest amiodarone lung toxicity at this time but certainly chest imaging is necessary for multiple reasons. I instructed the patient contact my office if she had any new breathing problems or questions before her next appointment.  1. Restrictive lung disease:  Repeat spirometry with DLCO and 6 minute walk test on room air at next appointment. Suspect secondary to cardiomegaly. Checking CT chest without contrast.  2. Pulmonary hypertension: Suspect secondary to congestive heart failure. No signs of volume overload. 3. Lung nodule:  Checking CT chest without contrast. 4. Pulmonary emboli:  Referring patient to hematology. Continuing systemic anticoagulation. 5. Follow-up: Return to clinic in 4 weeks or sooner if needed.  Donna Christen Jamison Neighbor, M.D. [facility] Pager:  [phone] After 3pm or if no response, call [phone] 12:39 PM 07/02/17

## 2017-07-02 NOTE — Patient Instructions (Signed)
   Call me if you have any new breathing problems before your next appointment.  We will do a breathing & walking test at your next appointment.  TESTS ORDERED: 1. CT CHEST W/O 2. Spirometry with DLCO at next appointment 3. on room air at next appointment

## 2017-07-07 ENCOUNTER — Ambulatory Visit (HOSPITAL_COMMUNITY)
Admission: RE | Admit: 2017-07-07 | Discharge: 2017-07-07 | Disposition: A | Discharge status: Home / Self Care | Payer: Medicare Other | Source: Ambulatory Visit | Attending: Pulmonary Disease | Admitting: Pulmonary Disease

## 2017-07-07 DIAGNOSIS — J984 Other disorders of lung: Secondary | ICD-10-CM | POA: Insufficient documentation

## 2017-07-07 DIAGNOSIS — I517 Cardiomegaly: Secondary | ICD-10-CM | POA: Insufficient documentation

## 2017-07-07 DIAGNOSIS — J439 Emphysema, unspecified: Secondary | ICD-10-CM | POA: Insufficient documentation

## 2017-07-07 DIAGNOSIS — I712 Thoracic aortic aneurysm, without rupture: Secondary | ICD-10-CM | POA: Insufficient documentation

## 2017-07-07 DIAGNOSIS — I700 Atherosclerosis of aorta: Secondary | ICD-10-CM | POA: Insufficient documentation

## 2017-07-07 DIAGNOSIS — R911 Solitary pulmonary nodule: Secondary | ICD-10-CM | POA: Insufficient documentation

## 2017-07-07 DIAGNOSIS — IMO0001 Reserved for inherently not codable concepts without codable children: Secondary | ICD-10-CM

## 2017-07-15 ENCOUNTER — Telehealth: Payer: Self-pay | Admitting: Pulmonary Disease

## 2017-07-15 NOTE — Telephone Encounter (Signed)
Attempted to contact pt's daughter, Sedalia Muta. No answer, no option to leave a message. Will try back.

## 2017-07-16 NOTE — Telephone Encounter (Signed)
Called and spoke with pts daughter Sedalia Muta and she is aware of JN recs.  She will relay this back to her mother.

## 2017-07-16 NOTE — Telephone Encounter (Signed)
1.  The patient has an appointment with the Cancer Center because I referred her to "Hematology & Oncology" for treatment recommendations regarding her PE.   2.  Regarding the CT scan there is some mild emphysema likely from her prior secondhand smoke exposure. She does have some mild scarring in her lungs likely from previous infections. However, there is nothing that is alarming. We will review this further at her follow-up.  Thank you.

## 2017-07-16 NOTE — Telephone Encounter (Signed)
Spoke with patient's daughter Karen Richardson. She was concerned about the pt's CT report in MyChart. She would like some clarifications on the impression. She is also wondering why her mother has an appt with cancer center at [facility]. She's currently scheduled for 9/13 at 1pm there and it appears our office scheduled it.   JN, please advise on the CT and reasoning for the cancer center appt. Thanks!

## 2017-07-16 NOTE — Telephone Encounter (Signed)
lmomtcb x2 for Karen Richardson to call back about her mother.

## 2017-07-20 ENCOUNTER — Other Ambulatory Visit: Payer: Self-pay | Admitting: Family Medicine

## 2017-07-27 ENCOUNTER — Ambulatory Visit (INDEPENDENT_AMBULATORY_CARE_PROVIDER_SITE_OTHER): Payer: Medicare Other | Admitting: *Deleted

## 2017-07-27 DIAGNOSIS — I4891 Unspecified atrial fibrillation: Secondary | ICD-10-CM

## 2017-07-27 DIAGNOSIS — Z5181 Encounter for therapeutic drug level monitoring: Secondary | ICD-10-CM

## 2017-07-27 DIAGNOSIS — Z7901 Long term (current) use of anticoagulants: Secondary | ICD-10-CM

## 2017-07-27 LAB — POCT INR: INR: 4

## 2017-08-05 ENCOUNTER — Ambulatory Visit (INDEPENDENT_AMBULATORY_CARE_PROVIDER_SITE_OTHER): Payer: Medicare Other | Admitting: Pulmonary Disease

## 2017-08-05 DIAGNOSIS — R911 Solitary pulmonary nodule: Secondary | ICD-10-CM

## 2017-08-05 DIAGNOSIS — J984 Other disorders of lung: Secondary | ICD-10-CM | POA: Diagnosis not present

## 2017-08-05 DIAGNOSIS — IMO0001 Reserved for inherently not codable concepts without codable children: Secondary | ICD-10-CM

## 2017-08-05 DIAGNOSIS — I2729 Other secondary pulmonary hypertension: Secondary | ICD-10-CM

## 2017-08-05 LAB — PULMONARY FUNCTION TEST
DL/VA % PRED: 61 %
DL/VA: 3.01 ml/min/mmHg/L
DLCO COR % PRED: 48 %
DLCO cor: 12.31 ml/min/mmHg
DLCO unc % pred: 48 %
DLCO unc: 12.49 ml/min/mmHg
FEF 25-75 Pre: 1.68 L/sec
FEF2575-%Pred-Pre: 120 %
FEV1-%PRED-PRE: 94 %
FEV1-PRE: 1.88 L
FEV1FVC-%Pred-Pre: 104 %
FEV6-%Pred-Pre: 95 %
FEV6-Pre: 2.41 L
FEV6FVC-%PRED-PRE: 105 %
FVC-%Pred-Pre: 90 %
FVC-Pre: 2.42 L
Pre FEV1/FVC ratio: 78 %
Pre FEV6/FVC Ratio: 100 %

## 2017-08-05 NOTE — Progress Notes (Signed)
PFT done today. 

## 2017-08-12 ENCOUNTER — Encounter (HOSPITAL_COMMUNITY): Payer: Medicare Other | Attending: Oncology

## 2017-08-17 ENCOUNTER — Other Ambulatory Visit: Payer: Self-pay | Admitting: Family Medicine

## 2017-08-17 ENCOUNTER — Encounter: Payer: Self-pay | Admitting: Pulmonary Disease

## 2017-08-17 ENCOUNTER — Ambulatory Visit (INDEPENDENT_AMBULATORY_CARE_PROVIDER_SITE_OTHER): Payer: Medicare Other | Admitting: *Deleted

## 2017-08-17 DIAGNOSIS — I4891 Unspecified atrial fibrillation: Secondary | ICD-10-CM

## 2017-08-17 DIAGNOSIS — Z7901 Long term (current) use of anticoagulants: Secondary | ICD-10-CM

## 2017-08-17 DIAGNOSIS — Z5181 Encounter for therapeutic drug level monitoring: Secondary | ICD-10-CM | POA: Diagnosis not present

## 2017-08-17 LAB — POCT INR: INR: 3.5

## 2017-08-23 ENCOUNTER — Other Ambulatory Visit: Payer: Self-pay | Admitting: Cardiovascular Disease

## 2017-08-25 NOTE — Progress Notes (Deleted)
Subjective:    Patient ID: Karen Richardson, female    DOB: [birth year], 83 y.o.   MRN: [id number]  C.C.:  Follow-up for Restrictive Lung Disease, Lung Nodule, Pulmonary Embolism, & Pulmonary Hypertension.   HPI Restrictive lung disease: Likely secondary to cardiomegaly.  Lung nodule: First seen on x-ray imaging in March 2018. This was not visualized on CT imaging performed in August.  Pulmonary embolism: Diagnosed earlier this year at outside hospital. Previously on systemic anticoagulation. Referred to hematology at last appointment.  Pulmonary hypertension: Likely secondary to left ventricular dysfunction.  Review of Systems    Allergies  Allergen Reactions  . Codeine Nausea Only    Current Outpatient Prescriptions on File Prior to Visit  Medication Sig Dispense Refill  . amiodarone (PACERONE) 200 MG tablet Take 1 tablet (200 mg total) by mouth daily. 30 tablet 6  . atorvastatin (LIPITOR) 10 MG tablet Take 1 tablet (10 mg total) by mouth every evening. 30 tablet 6  . carbamazepine (TEGRETOL XR) 100 MG 12 hr tablet TAKE ONE TABLET BY MOUTH TWICE DAILY (MORNING ,BEDTIME) 60 tablet 3  . diltiazem (CARDIZEM CD) 240 MG 24 hr capsule Take 1 capsule (240 mg total) by mouth daily. 30 capsule 6  . furosemide (LASIX) 40 MG tablet Take 1 tablet (40 mg total) by mouth daily. Take second if needed (Patient taking differently: Take 40 mg by mouth. Take second if needed) 180 tablet 3  . pantoprazole (PROTONIX) 40 MG tablet Take 1 tablet (40 mg total) by mouth daily. 90 tablet 3  . potassium chloride (K-DUR,KLOR-CON) 10 MEQ tablet TAKE ONE TABLET BY MOUTH TWICE DAILY. (MORNING ,BEDTIME) 60 tablet 3  . Vitamin D, Ergocalciferol, (DRISDOL) 50000 units CAPS capsule TAKE ONE CAPSULE BY MOUTH EVERY 7 DAYS (FRIDAY MORNING) 12 capsule 3  . warfarin (COUMADIN) 2.5 MG tablet Take as directed by the anticoagulation clinic (Patient taking differently: Take 2.5-5 mg by mouth See admin instructions. Take 2  tablets (5 mg) on all days with the exception of Saturday & Tuesday ONLY take 1 tablet (2.5 mg)) 90 tablet 3   No current facility-administered medications on file prior to visit.     Past Medical History:  Diagnosis Date  . Anxiety   . Atrial fibrillation with RVR (HCC) 12/01/2016  . CKD (chronic kidney disease), stage III 12/12/2016  . Depression   . Dysphagia   . GERD (gastroesophageal reflux disease) 12/01/2016  . Heart murmur   . History of esophageal stricture 12/10/2016  . Hyperlipidemia   . Hypertension    x 4 months  . Presbyesophagus 12/11/2016  . Pulmonary emboli (HCC) 12/01/2016  . Pulmonary embolism (HCC)   . Pulmonary nodule   . Seizures (HCC)   . Stroke [facility])     Past Surgical History:  Procedure Laterality Date  . ABDOMINAL HYSTERECTOMY    . BREAST ENHANCEMENT SURGERY     40 yrs ago  . CARDIOVERSION N/A 05/03/2017   Procedure: CARDIOVERSION;  Surgeon: Elease Hashimoto Deloris Ping, MD;  Location: [facility] ENDOSCOPY;  Service: Cardiovascular;  Laterality: N/A;  . ESOPHAGEAL DILATION N/A 03/17/2017   Procedure: ESOPHAGEAL DILATION;  Surgeon: Malissa Hippo, MD;  Location: [facility] ENDO SUITE;  Service: Endoscopy;  Laterality: N/A;  . ESOPHAGOGASTRODUODENOSCOPY N/A 03/17/2017   Procedure: ESOPHAGOGASTRODUODENOSCOPY (EGD);  Surgeon: Malissa Hippo, MD;  Location: [facility] ENDO SUITE;  Service: Endoscopy;  Laterality: N/A;  10:30  . PARTIAL HYSTERECTOMY    . SPINE SURGERY     Tumor removal  .  Tumor removed from spinal cord     benign    Family History  Problem Relation Age of Onset  . Pulmonary embolism Mother   . Cancer Mother        mass in colon  . Stroke Father   . Alcohol abuse Father   . Diabetes Sister   . Heart failure Sister   . Brain cancer Sister   . Schizophrenia Brother   . Lung disease Neg Hx     Social History   Social History  . Marital status: Married    Spouse name: george  . Number of children: 4  . Years of education: 12   Occupational History  . retired      Neurosurgeon   Social History Main Topics  . Smoking status: Passive Smoke Exposure - Never Smoker    Types: Cigarettes  . Smokeless tobacco: Never Used     Comment: Father as a child   . Alcohol use No  . Drug use: No  . Sexual activity: Not Currently   Other Topics Concern  . Not on file   Social History Narrative   Lives with Greggory Stallion   many pets      [facility] (07/02/17):   Originally from [facility]. She has lived in [place], [place], [place]. Previously has been a homemaker primarily. Currently has 5 dogs & 2 cats. No bird or mold exposure.       Objective:   Physical Exam There were no vitals taken for this visit.  General:  Awake. Alert. No acute distress. Sitting watching TV. Family at bedside.  Integument:  Warm & dry. No rash on exposed skin. No bruising. Extremities:  No cyanosis or clubbing.  HEENT:  Moist mucus membranes. No oral ulcers. No scleral injection or icterus. Endotracheal tube in place. PERRL. Cardiovascular:  Regular rate. No edema. No appreciable JVD.  Pulmonary:  Good aeration & clear to auscultation bilaterally. Symmetric chest wall expansion. No accessory muscle use. Abdomen: Soft. Normal bowel sounds. Nondistended. Grossly nontender. Musculoskeletal:  Normal bulk and tone. Hand grip strength 5/5 bilaterally. No joint deformity or effusion appreciated.  *** General:  Awake. Alert. No acute distress. Accompanied by husband today. Integument:  Warm & dry. No rash on exposed skin.  Extremities:  No cyanosis or clubbing.  Lymphatics:  No appreciated cervical or supraclavicular lymphadenoapthy. HEENT:  Moist mucus membranes. No oral ulcers. Minimal nasal turbinate swelling. No scleral icterus. Cardiovascular:  Regular rate. No edema. Varicosities noted in lower extremities.  Pulmonary:  Good aeration & clear to auscultation bilaterally. Symmetric chest wall expansion. No accessory muscle use on room air. Abdomen: Soft. Normal bowel sounds. Nondistended. Grossly  nontender. Musculoskeletal:  Normal bulk and tone. Hand grip strength 5/5 bilaterally. No joint effusion appreciated. Synovial thickening bilateral PIP & DIP joints. Neurological:  CN 2-12 grossly in tact. No meningismus. Moving all 4 extremities equally. Symmetric brachioradialis deep tendon reflexes. Psychiatric:  Mood and affect congruent. Speech normal rhythm, rate & tone.   PFT 08/05/17: FVC 2.42 L (90%) FEV1 1.8 L (94%) FEV1/FVC 0.78 FEF 25-75 1.68 L (120%)  DLCO corrected 48% 04/28/17: FVC 2.30 L (85%) FEV1 1.81 L (90%) FEV1/FVC 0.79 FEF 25-75 1.73 L (122%) negative bronchodilator response TLC 3.1 L (73%) RV 60% ERV 60% DLCO uncorrected 49%  08/26/17: ***  IMAGING CT CHEST W/O 07/07/17 (personally reviewed by me):  Cardiomegaly appreciated as well as dilation of ascending aorta. Very minimal apical predominant emphysema. Minimal amount of pleural thickening primarily on the left. Bilateral breast prosthesis noted. No pathologic mediastinal or hilar adenopathy. No pleural effusion. No pericardial effusion. No parenchymal nodule or mass appreciated.  CXR PA/LAT 02/19/17 (previously reviewed by me):  Approximately 2 cm nodule present on lateral view. Cardiomegaly without pleural effusion noted. Some flattening of the diaphragms suggestive of hyperinflation. Mediastinum normal in contour.  CARDIAC TTE (02/22/17):  LV normal in size with mild LVH. EF 55-60%. Images inadequate to assess diastolic function or wall motion. LA & RA severely dilated. RV mild to moderately dilated with mildly reduced systolic function. Mild aortic regurgitation without stenosis. Aortic root normal in size. Mild mitral regurgitation without stenosis. No significant pulmonic regurgitation or stenosis with poorly visualized. Moderate tricuspid regurgitation. No pericardial effusion.  LABS 12/06/16 ABG on 2 L/m:   7.362 / 45.2 / 76.4 / 93.3    Assessment & Plan:  83 y.o.   female previously diagnosed with pulmonary embolism earlier this year. This appears to be unprovoked pulmonary emboli. Reviewing the patient's previous imaging does show a 2 cm nodule on lateral view from her March chest x-ray which was not present on previous CT imaging some years ago. I do not have access to the CT angiogram that previously diagnosed her pulmonary emboli. Reviewing her pulmonary function testing does show a moderate reduction in her carbon monoxide capacity as well as mild restriction. This can certainly be due to her previous pulmonary embolism as well as cardiomegaly and congestive heart failure. She does not have other symptoms that would suggest amiodarone lung toxicity at this time but certainly chest imaging is necessary for multiple reasons. I instructed the patient contact my office if she had any new breathing problems or questions before her next appointment.  1. Restrictive lung disease: 2. Pulmonary emboli: 3. Health maintenance: Status post pneumonia vaccine in August 2013. 4. Follow-up: Return to clinic in  5. Restrictive lung disease:  Repeat spirometry with DLCO and 6 minute walk test on room air at next appointment. Suspect secondary to cardiomegaly. Checking CT chest without contrast.  6. Pulmonary hypertension: Suspect secondary to congestive heart failure. No signs of volume overload. 7. Lung nodule:  Checking CT chest without contrast. 8. Pulmonary emboli:  Referring patient to hematology. Continuing systemic anticoagulation. 9. Follow-up: Return to clinic in 4 weeks or sooner if needed.  Donna Christen Jamison Neighbor, M.D. [facility] Pager:  [phone] After 3pm or if no response, call [phone] 5:59 PM 08/25/17

## 2017-08-26 ENCOUNTER — Ambulatory Visit: Payer: Medicare Other | Admitting: Pulmonary Disease

## 2017-08-26 ENCOUNTER — Ambulatory Visit: Payer: Medicare Other

## 2017-09-03 ENCOUNTER — Emergency Department (HOSPITAL_COMMUNITY): Payer: Medicare Other

## 2017-09-03 ENCOUNTER — Emergency Department (HOSPITAL_COMMUNITY)
Admission: EM | Admit: 2017-09-03 | Discharge: 2017-09-03 | Disposition: A | Discharge status: Home / Self Care | Payer: Medicare Other | Attending: Emergency Medicine | Admitting: Emergency Medicine

## 2017-09-03 ENCOUNTER — Encounter (HOSPITAL_COMMUNITY): Payer: Self-pay | Admitting: Emergency Medicine

## 2017-09-03 DIAGNOSIS — Y999 Unspecified external cause status: Secondary | ICD-10-CM | POA: Diagnosis not present

## 2017-09-03 DIAGNOSIS — S20211A Contusion of right front wall of thorax, initial encounter: Secondary | ICD-10-CM | POA: Insufficient documentation

## 2017-09-03 DIAGNOSIS — S60212A Contusion of left wrist, initial encounter: Secondary | ICD-10-CM | POA: Diagnosis not present

## 2017-09-03 DIAGNOSIS — Y92009 Unspecified place in unspecified non-institutional (private) residence as the place of occurrence of the external cause: Secondary | ICD-10-CM | POA: Insufficient documentation

## 2017-09-03 DIAGNOSIS — Z79899 Other long term (current) drug therapy: Secondary | ICD-10-CM | POA: Diagnosis not present

## 2017-09-03 DIAGNOSIS — I129 Hypertensive chronic kidney disease with stage 1 through stage 4 chronic kidney disease, or unspecified chronic kidney disease: Secondary | ICD-10-CM | POA: Insufficient documentation

## 2017-09-03 DIAGNOSIS — Z7722 Contact with and (suspected) exposure to environmental tobacco smoke (acute) (chronic): Secondary | ICD-10-CM | POA: Insufficient documentation

## 2017-09-03 DIAGNOSIS — Y9301 Activity, walking, marching and hiking: Secondary | ICD-10-CM | POA: Diagnosis not present

## 2017-09-03 DIAGNOSIS — Z7901 Long term (current) use of anticoagulants: Secondary | ICD-10-CM | POA: Diagnosis not present

## 2017-09-03 DIAGNOSIS — S0990XA Unspecified injury of head, initial encounter: Secondary | ICD-10-CM | POA: Diagnosis not present

## 2017-09-03 DIAGNOSIS — N183 Chronic kidney disease, stage 3 (moderate): Secondary | ICD-10-CM | POA: Diagnosis not present

## 2017-09-03 DIAGNOSIS — S40011A Contusion of right shoulder, initial encounter: Secondary | ICD-10-CM | POA: Insufficient documentation

## 2017-09-03 DIAGNOSIS — W108XXA Fall (on) (from) other stairs and steps, initial encounter: Secondary | ICD-10-CM | POA: Insufficient documentation

## 2017-09-03 NOTE — ED Triage Notes (Signed)
Patient reports falling down approx 6 steps hitting her head. Denies any LOC or vomiting. Patient c/o left knee pain, upper back pain, and hematoma to top of head. Denies headache or dizziness. Patient takes coumadin.

## 2017-09-03 NOTE — ED Notes (Signed)
Pt slipped on stairs at home today. Denies loc prior or after fall. Pt reports pain to left knee and right middle back. Pt also reports hitting head. Pt is on coumadin. nad noted.

## 2017-09-03 NOTE — Discharge Instructions (Signed)
Your x-ray showed no signs of injuries to your brain or your spine Your x-ray showed a possible fractured rib on the right, this is a possibility and not an obvious injury Your x-ray of the wrist was normal, no fractures  Because you're on a blood thinner and makes it difficult to use pain medications. Please use Tylenol and ice packs, seek medical attention if no improvement or if symptoms are worsening. Use the spirometer that we have given you and showed you how to use at home every hour. This will help prevent pneumonia  ER for increased pain, difficulty breathing, coughing, fevers or bleeding

## 2017-09-03 NOTE — ED Provider Notes (Signed)
[facility] DEPT Provider Note   CSN: [id number] Arrival date & time: 09/03/17  1608     History   Chief Complaint Chief Complaint  Patient presents with  . Fall    HPI Karen Richardson is a 83 y.o. female.  HPI  The patient is an 83 year old female with a known history of chronic kidney disease, atrial fibrillation, pulmonary embolism as well as a history of seizures and stroke. She was in her usual state of health walking down the steps at her house when she accidentally slipped on a tin can which they Knocked over as the cat ran down the steps as well. She fell down approximately 6 steps striking her right shoulder, right lower ribs, left knee, left wrist and ultimately her head on the ground when she hit. She is on Coumadin. She is concerned because of headache and ongoing pain in those areas. This occurred approximately 3 hours ago. She refused EMS transport and came by private vehicle. She declines pain medications at this time. No nausea vomiting seizures or loss of consciousness.  Past Medical History:  Diagnosis Date  . Anxiety   . Atrial fibrillation with RVR (HCC) 12/01/2016  . CKD (chronic kidney disease), stage III (HCC) 12/12/2016  . Depression   . Dysphagia   . GERD (gastroesophageal reflux disease) 12/01/2016  . Heart murmur   . History of esophageal stricture 12/10/2016  . Hyperlipidemia   . Hypertension    x 4 months  . Presbyesophagus 12/11/2016  . Pulmonary emboli (HCC) 12/01/2016  . Pulmonary embolism (HCC)   . Pulmonary nodule   . Seizures (HCC)   . Stroke [facility])     Patient Active Problem List   Diagnosis Date Noted  . Restrictive lung disease 07/02/2017  . Other secondary pulmonary hypertension (HCC) 07/02/2017  . Lung nodule < 6cm on CT 07/02/2017  . Persistent atrial fibrillation (HCC)   . Esophageal dysphagia 02/03/2017  . Dizziness 01/07/2017  . Monitoring for long-term anticoagulant use 12/18/2016  . CKD (chronic kidney disease), stage III (HCC)  12/12/2016  . Seizure disorder (HCC) 12/12/2016  . Presbyesophagus 12/11/2016  . History of esophageal stricture 12/10/2016  . Dysphagia   . Atrial fibrillation with RVR (HCC) 12/01/2016  . Pulmonary emboli (HCC) 12/01/2016  . GERD (gastroesophageal reflux disease) 12/01/2016  . Arthralgia of both knees 01/11/2016  . Hypertension 09/22/2011    Past Surgical History:  Procedure Laterality Date  . ABDOMINAL HYSTERECTOMY    . BREAST ENHANCEMENT SURGERY     40 yrs ago  . CARDIOVERSION N/A 05/03/2017   Procedure: CARDIOVERSION;  Surgeon: Elease Hashimoto Deloris Ping, MD;  Location[facility] ENDOSCOPY;  Service: Cardiovascular;  Laterality: N/A;  . ESOPHAGEAL DILATION N/A 03/17/2017   Procedure: ESOPHAGEAL DILATION;  Surgeon: Malissa Hippo, MD;  Location: [facility] ENDO SUITE;  Service: Endoscopy;  Laterality: N/A;  . ESOPHAGOGASTRODUODENOSCOPY N/A 03/17/2017   Procedure: ESOPHAGOGASTRODUODENOSCOPY (EGD);  Surgeon: Malissa Hippo, MD;  Location: [facility] ENDO SUITE;  Service: Endoscopy;  Laterality: N/A;  10:30  . PARTIAL HYSTERECTOMY    . SPINE SURGERY     Tumor removal  . Tumor removed from spinal cord     benign    OB History    Gravida Para Term Preterm AB Living   4 4 4     2    SAB TAB Ectopic Multiple Live Births                   Home Medications  Prior to Admission medications   Medication Sig Start Date End Date Taking? Authorizing Provider  amiodarone (PACERONE) 200 MG tablet Take 1 tablet (200 mg total) by mouth daily. 05/11/17   Newman Nip, NP  atorvastatin (LIPITOR) 10 MG tablet Take 1 tablet (10 mg total) by mouth every evening. 03/30/17   Laqueta Linden, MD  carbamazepine (TEGRETOL XR) 100 MG 12 hr tablet TAKE ONE TABLET BY MOUTH TWICE DAILY (MORNING ,BEDTIME) 08/18/17   Eustace Moore, MD  diltiazem (CARDIZEM CD) 240 MG 24 hr capsule Take 1 capsule (240 mg total) by mouth daily. 05/11/17 08/09/17  Newman Nip, NP  furosemide (LASIX) 40 MG tablet Take 1 tablet (40 mg total)  by mouth daily. Take second if needed Patient taking differently: Take 40 mg by mouth. Take second if needed 06/17/17 09/15/17  Eustace Moore, MD  pantoprazole (PROTONIX) 40 MG tablet Take 1 tablet (40 mg total) by mouth daily. 03/09/17   Eustace Moore, MD  potassium chloride (K-DUR,KLOR-CON) 10 MEQ tablet TAKE ONE TABLET BY MOUTH TWICE DAILY. (MORNING ,BEDTIME) 08/23/17   Laqueta Linden, MD  Vitamin D, Ergocalciferol, (DRISDOL) 50000 units CAPS capsule TAKE ONE CAPSULE BY MOUTH EVERY 7 DAYS (FRIDAY MORNING) 08/18/17   Eustace Moore, MD  warfarin (COUMADIN) 2.5 MG tablet Take as directed by the anticoagulation clinic Patient taking differently: Take 2.5-5 mg by mouth See admin instructions. Take 2 tablets (5 mg) on all days with the exception of Saturday & Tuesday ONLY take 1 tablet (2.5 mg) 03/11/17   Laqueta Linden, MD    Family History Family History  Problem Relation Age of Onset  . Pulmonary embolism Mother   . Cancer Mother        mass in colon  . Stroke Father   . Alcohol abuse Father   . Diabetes Sister   . Heart failure Sister   . Brain cancer Sister   . Schizophrenia Brother   . Lung disease Neg Hx     Social History Social History  Substance Use Topics  . Smoking status: Passive Smoke Exposure - Never Smoker    Types: Cigarettes  . Smokeless tobacco: Never Used     Comment: Father as a child   . Alcohol use No     Allergies   Codeine   Review of Systems Review of Systems  All other systems reviewed and are negative.    Physical Exam Updated Vital Signs BP (!) 150/75   Pulse 61   Temp 97.8 F (36.6 C)   Resp 18   Ht 5' 5.5" (1.664 m)   Wt 76.2 kg (168 lb)   SpO2 100%   BMI 27.53 kg/m   Physical Exam  Constitutional: She appears well-developed and well-nourished. No distress.  HENT:  Head: Normocephalic.  Mouth/Throat: Oropharynx is clear and moist. No oropharyngeal exudate.  Small hematoma to the scalp  Eyes: Pupils are  equal, round, and reactive to light. Conjunctivae and EOM are normal. Right eye exhibits no discharge. Left eye exhibits no discharge. No scleral icterus.  Neck: Normal range of motion. Neck supple. No JVD present. No thyromegaly present.  Cardiovascular: Normal rate, regular rhythm, normal heart sounds and intact distal pulses.  Exam reveals no gallop and no friction rub.   No murmur heard. Pulmonary/Chest: Effort normal and breath sounds normal. No respiratory distress. She has no wheezes. She has no rales.  Abdominal: Soft. Bowel sounds are normal. She exhibits no distension and no  mass. There is no tenderness.  Musculoskeletal: Normal range of motion. She exhibits tenderness ( Mild swelling of the left knee, range of motion to 90 with some pain, no overlying injury to the skin). She exhibits no edema.  Moves all 4 extremities with good range of motion except for the right shoulder with some pain, the left wrist with some pain in the left knee with some pain. There is tenderness with palpation around the right posterior shoulder as well as the right lower posterior ribs, no crepitance or subcutaneous emphysema palpated  Lymphadenopathy:    She has no cervical adenopathy.  Neurological: She is alert. Coordination normal.  Follows commands without difficulty, normal speech, normal memory, normal strength in all 4 extremities  Skin: Skin is warm and dry. No rash noted. No erythema.  Psychiatric: She has a normal mood and affect. Her behavior is normal.  Nursing note and vitals reviewed.    ED Treatments / Results  Labs (all labs ordered are listed, but only abnormal results are displayed) Labs Reviewed - No data to display   Radiology Dg Ribs Unilateral W/chest Right  Result Date: 09/03/2017 CLINICAL DATA:  Initial evaluation for acute right shoulder pain status post fall. EXAM: RIGHT RIBS AND CHEST - 3+ VIEW COMPARISON:  Prior CT from 07/07/2017. FINDINGS: Cardiomegaly. Mediastinal  silhouette normal. Aortic atherosclerosis. Lungs normally inflated. No focal infiltrates. No pulmonary edema or pleural effusion. No pneumothorax. Metallic BB marker overlies the lower right ribs at site of pain. There is subtle cortical angulation with possible lucency extending through the right posterolateral eighth rib, which may reflect an acute nondisplaced fracture. This is positioned immediately subjacent to the BB. No other acute fracture identified. Osteopenia noted. IMPRESSION: 1. Subtle cortical angulation with lucency extending through the right posterolateral 8th rib, suspicious for possible acute nondisplaced fracture. Correlation with site of pain recommended. 2. No other active cardiopulmonary disease. 3. Cardiomegaly. 4. Aortic atherosclerosis. Electronically Signed   By: Rise Mu M.D.   On: 09/03/2017 19:23   Dg Shoulder Right  Result Date: 09/03/2017 CLINICAL DATA:  Right shoulder pain after fall EXAM: RIGHT SHOULDER - 2+ VIEW COMPARISON:  None. FINDINGS: No fracture. No right glenohumeral joint dislocation. No evidence of right acromioclavicular joint separation. Mild right acromioclavicular joint osteoarthritis. Small to moderate inferior right humeral head osteophyte. No suspicious focal osseous lesions. No pathologic soft tissue calcifications. IMPRESSION: 1. No right shoulder fracture or malalignment. 2. Mild right acromioclavicular and inferior right glenohumeral joint osteoarthritis. Electronically Signed   By: Delbert Phenix M.D.   On: 09/03/2017 19:20   Dg Wrist Complete Left  Result Date: 09/03/2017 CLINICAL DATA:  Initial evaluation for acute trauma, fall. EXAM: LEFT WRIST - COMPLETE 3+ VIEW COMPARISON:  None. FINDINGS: No acute fracture or dislocation. Normal radiocarpal and distal radioulnar articulations intact. No acute soft tissue abnormality. Scattered degenerative osteoarthritic changes present with the visualized hand. Osteopenia. IMPRESSION: No acute osseous  abnormality about the left wrist. Electronically Signed   By: Rise Mu M.D.   On: 09/03/2017 19:26   Ct Head Wo Contrast  Result Date: 09/03/2017 CLINICAL DATA:  Head trauma after fall. EXAM: CT HEAD WITHOUT CONTRAST TECHNIQUE: Contiguous axial images were obtained from the base of the skull through the vertex without intravenous contrast. COMPARISON:  CT head dated March 25, 2017. FINDINGS: Brain: No evidence of acute infarction, hemorrhage, hydrocephalus, extra-axial collection or mass lesion/mass effect. Mmild age-related cerebral atrophy with compensatory dilatation of the ventricles. Scattered mild periventricular white matter  and corona radiata hypodensities favor chronic ischemic microvascular white matter disease. Vascular: No hyperdense vessel or unexpected calcification. Skull: Normal. Negative for fracture or focal lesion. Sinuses/Orbits: Small mucous retention cysts in the right sphenoid sinus. The remaining paranasal sinuses and mastoid air cells are clear. The orbits are unremarkable. Other: None. IMPRESSION: No acute intracranial abnormality. Electronically Signed   By: Obie Dredge M.D.   On: 09/03/2017 17:12   Dg Knee Complete 4 Views Left  Result Date: 09/03/2017 CLINICAL DATA:  Initial evaluation for acute generalized knee pain, fall. EXAM: LEFT KNEE - COMPLETE 4+ VIEW COMPARISON:  None. FINDINGS: No acute fracture dislocation. Small joint effusion noted within suprapatellar recess. Mild to moderate degenerative osteoarthritic changes noted about the knee. No acute soft tissue abnormality. Bones are somewhat osteopenic. IMPRESSION: 1. No acute fracture or dislocation. 2. Small joint effusion. 3. Mild to moderate degenerative osteoarthrosis. Electronically Signed   By: Rise Mu M.D.   On: 09/03/2017 17:35    Procedures Procedures (including critical care time)  Medications Ordered in ED Medications - No data to display   Initial Impression / Assessment  and Plan / ED Course  I have reviewed the triage vital signs and the nursing notes.  Pertinent labs & imaging results that were available during my care of the patient were reviewed by me and considered in my medical decision making (see chart for details).      Initial imaging with CT scan of the brain and left knee is unremarkable, we'll add left wrist, right shoulder and right lower ribs. Patient declines pain medication, otherwise stable with normal neurologic exam.  Possible cracked rib Pt informed No pain with breathing No other injuries after imaging of shoulder and wrist Stable for d/c Hesitant to place on NSAID given age and warfarin use Pt declines other pain med's Stable for d/c. Incentive  Spir given prior to d/c.  Final Clinical Impressions(s) / ED Diagnoses   Final diagnoses:  Contusion of right shoulder, initial encounter  Minor head injury, initial encounter  Contusion of rib on right side, initial encounter  Contusion of left wrist, initial encounter    New Prescriptions New Prescriptions   No medications on file     Eber Hong, MD 09/03/17 1950

## 2017-09-04 ENCOUNTER — Encounter: Payer: Self-pay | Admitting: Pulmonary Disease

## 2017-09-06 ENCOUNTER — Ambulatory Visit (INDEPENDENT_AMBULATORY_CARE_PROVIDER_SITE_OTHER): Payer: Medicare Other | Admitting: Pulmonary Disease

## 2017-09-06 ENCOUNTER — Ambulatory Visit (INDEPENDENT_AMBULATORY_CARE_PROVIDER_SITE_OTHER): Payer: Medicare Other | Admitting: *Deleted

## 2017-09-06 ENCOUNTER — Encounter: Payer: Self-pay | Admitting: Pulmonary Disease

## 2017-09-06 VITALS — BP 122/74 | HR 74 | Ht 65.5 in | Wt 171.1 lb

## 2017-09-06 DIAGNOSIS — I2699 Other pulmonary embolism without acute cor pulmonale: Secondary | ICD-10-CM | POA: Diagnosis not present

## 2017-09-06 DIAGNOSIS — J439 Emphysema, unspecified: Secondary | ICD-10-CM | POA: Diagnosis not present

## 2017-09-06 DIAGNOSIS — J984 Other disorders of lung: Secondary | ICD-10-CM

## 2017-09-06 DIAGNOSIS — I2729 Other secondary pulmonary hypertension: Secondary | ICD-10-CM | POA: Diagnosis not present

## 2017-09-06 HISTORY — DX: Emphysema, unspecified: J43.9

## 2017-09-06 NOTE — Progress Notes (Signed)
SIX MIN WALK 09/06/2017  Medications no meds taken this am  Supplimental Oxygen during Test? (L/min) No  Laps 5  Partial Lap (in Meters) 24  Baseline BP (sitting) 140/86  Baseline Heartrate 67  Baseline Dyspnea (Borg Scale) 0  Baseline Fatigue (Borg Scale) 2  Baseline SPO2 100  BP (sitting) 144/88  Heartrate 98  Dyspnea (Borg Scale) 5  Fatigue (Borg Scale) 2  SPO2 97  BP (sitting) 142/84  Heartrate 72  SPO2 100  Stopped or Paused before Six Minutes No  Distance Completed 264  Tech Comments: test performed with forehead. pt walked at slow pace with no desats. pt c/o leg pain due to recent fall.

## 2017-09-06 NOTE — Progress Notes (Signed)
Subjective:    Patient ID: Karen Richardson, female    DOB: [birth year], 83 y.o.   MRN: [id number]  C.C.:  Follow-up for Restrictive Lung Disease, Lung Nodule, Pulmonary Embolism, & Pulmonary Hypertension.   HPI Restrictive lung disease: Likely secondary to cardiomegaly.  No suggestion of interstitial lung disease on chest CT imaging. She denies any coughing or wheezing.   Lung nodule: First seen on x-ray imaging in March 2018. This was not visualized on CT imaging performed in August.  Pulmonary embolism: Diagnosed earlier this year at outside hospital. Previously on systemic anticoagulation. Referred to hematology at last appointment. She is on Coumadin chronically for her atrial fibrillation. She isn't sure she saw the hematologist and thinks she missed her appointment.   Pulmonary hypertension: Multifactorial & likely combination of group 2 & 3. Denies any lower extremity edema. No near syncope or syncope. She is now only taking Lasix once daily.   Review of Systems  She reports no chest pain or pressure with her recent fall. No fever or chills. No abdominal pain or nausea.   Allergies  Allergen Reactions  . Codeine Nausea Only    Current Outpatient Prescriptions on File Prior to Visit  Medication Sig Dispense Refill  . amiodarone (PACERONE) 200 MG tablet Take 1 tablet (200 mg total) by mouth daily. 30 tablet 6  . atorvastatin (LIPITOR) 10 MG tablet Take 1 tablet (10 mg total) by mouth every evening. 30 tablet 6  . carbamazepine (TEGRETOL XR) 100 MG 12 hr tablet TAKE ONE TABLET BY MOUTH TWICE DAILY (MORNING ,BEDTIME) 60 tablet 3  . diltiazem (CARDIZEM CD) 240 MG 24 hr capsule Take 1 capsule (240 mg total) by mouth daily. 30 capsule 6  . furosemide (LASIX) 40 MG tablet Take 1 tablet (40 mg total) by mouth daily. Take second if needed 180 tablet 3  . pantoprazole (PROTONIX) 40 MG tablet Take 1 tablet (40 mg total) by mouth daily. 90 tablet 3  . potassium chloride (K-DUR,KLOR-CON) 10 MEQ  tablet TAKE ONE TABLET BY MOUTH TWICE DAILY. (MORNING ,BEDTIME) 60 tablet 3  . Vitamin D, Ergocalciferol, (DRISDOL) 50000 units CAPS capsule TAKE ONE CAPSULE BY MOUTH EVERY 7 DAYS (FRIDAY MORNING) 12 capsule 3  . warfarin (COUMADIN) 2.5 MG tablet Take as directed by the anticoagulation clinic (Patient taking differently: Take 2.5-5 mg by mouth See admin instructions. Take 2 tablets (5 mg) on all days with the exception of Saturday & Tuesday ONLY take 1 tablet (2.5 mg)) 90 tablet 3   No current facility-administered medications on file prior to visit.     Past Medical History:  Diagnosis Date  . Anxiety   . Atrial fibrillation with RVR (HCC) 12/01/2016  . CKD (chronic kidney disease), stage III (HCC) 12/12/2016  . Depression   . Dysphagia   . GERD (gastroesophageal reflux disease) 12/01/2016  . Heart murmur   . History of esophageal stricture 12/10/2016  . Hyperlipidemia   . Hypertension    x 4 months  . Presbyesophagus 12/11/2016  . Pulmonary emboli (HCC) 12/01/2016  . Pulmonary embolism (HCC)   . Pulmonary emphysema (HCC) 09/06/2017  . Pulmonary nodule   . Seizures (HCC)   . Stroke [facility])     Past Surgical History:  Procedure Laterality Date  . ABDOMINAL HYSTERECTOMY    . BREAST ENHANCEMENT SURGERY     40 yrs ago  . CARDIOVERSION N/A 05/03/2017   Procedure: CARDIOVERSION;  Surgeon: Elease Hashimoto Deloris Ping, MD;  Location: [facility] ENDOSCOPY;  Service: Cardiovascular;  Laterality: N/A;  . ESOPHAGEAL DILATION N/A 03/17/2017   Procedure: ESOPHAGEAL DILATION;  Surgeon: Malissa Hippo, MD;  Location: [facility] ENDO SUITE;  Service: Endoscopy;  Laterality: N/A;  . ESOPHAGOGASTRODUODENOSCOPY N/A 03/17/2017   Procedure: ESOPHAGOGASTRODUODENOSCOPY (EGD);  Surgeon: Malissa Hippo, MD;  Location: [facility] ENDO SUITE;  Service: Endoscopy;  Laterality: N/A;  10:30  . PARTIAL HYSTERECTOMY    . SPINE SURGERY     Tumor removal  . Tumor removed from spinal cord     benign    Family History  Problem Relation Age of Onset  .  Pulmonary embolism Mother   . Cancer Mother        mass in colon  . Stroke Father   . Alcohol abuse Father   . Diabetes Sister   . Heart failure Sister   . Brain cancer Sister   . Schizophrenia Brother   . Lung disease Neg Hx     Social History   Social History  . Marital status: Married    Spouse name: george  . Number of children: 4  . Years of education: 12   Occupational History  . retired     Neurosurgeon   Social History Main Topics  . Smoking status: Passive Smoke Exposure - Never Smoker    Types: Cigarettes  . Smokeless tobacco: Never Used     Comment: Father as a child   . Alcohol use No  . Drug use: No  . Sexual activity: Not Currently   Other Topics Concern  . Not on file   Social History Narrative   Lives with Greggory Stallion   many pets      [facility] (07/02/17):   Originally from [facility]. She has lived in [place], [place], [place]. Previously has been a homemaker primarily. Currently has 5 dogs & 2 cats. No bird or mold exposure.       Objective:   Physical Exam BP 122/74   Pulse 74   Ht 5' 5.5" (1.664 m)   Wt 171 lb 2 oz (77.6 kg)   SpO2 95%   BMI 28.04 kg/m   General:  Awake. Alert. No distress.  Integument:  Warm & dry. No rash on exposed skin. Extremities:  No cyanosis or clubbing.  HEENT:  Moist mucus membranes. No oral ulcers. No scleral injection or icterus.  Cardiovascular:  Regular rate & rhythm. No edema. No appreciable JVD.  Pulmonary:  Good aeration & clear to auscultation bilaterally. Normal work of breathing on room air. Abdomen: Soft. Normal bowel sounds. Nondistended.  Musculoskeletal:  Normal bulk and tone. No joint deformity or effusion appreciated.  PFT 08/05/17: FVC 2.42 L (90%) FEV1 1.88 L (94%) FEV1/FVC 0.78 FEF 25-75 1.68 L (120%)                                                                                                                       DLCO corrected 48% 04/28/17: FVC 2.30 L (85%) FEV1 1.81 L (90%) FEV1/FVC 0.79 FEF 25-75  1.73 L (122%) negative bronchodilator response TLC 3.1 L (73%) RV 60% ERV 60% DLCO uncorrected 49%  09/06/17:  Walked 264 meters / Baseline Sat 100% on RA / Nadir Sat 97% on RA  IMAGING CT CHEST W/O 07/07/17 (personally reviewed by me):  Cardiomegaly appreciated as well as dilation of ascending aorta. Very minimal apical predominant emphysema. Minimal amount of pleural thickening primarily on the left. Bilateral breast prosthesis noted. No pathologic mediastinal or hilar adenopathy. No pleural effusion. No pericardial effusion. No parenchymal nodule or mass appreciated.  CXR PA/LAT 02/19/17 (previously reviewed by me):  Approximately 2 cm nodule present on lateral view. Cardiomegaly without pleural effusion noted. Some flattening of the diaphragms suggestive of hyperinflation. Mediastinum normal in contour.  CARDIAC TTE (02/22/17):  LV normal in size with mild LVH. EF 55-60%. Images inadequate to assess diastolic function or wall motion. LA & RA severely dilated. RV mild to moderately dilated with mildly reduced systolic function. Mild aortic regurgitation without stenosis. Aortic root normal in size. Mild mitral regurgitation without stenosis. No significant pulmonic regurgitation or stenosis with poorly visualized. Moderate tricuspid regurgitation. No pericardial effusion.  LABS 12/06/16 ABG on 2 L/m:  7.362 / 45.2 / 76.4 / 93.3    Assessment & Plan:  83 y.o. female with multiple medical problems including restrictive lung disease and history of pulmonary emboli. Pulmonary hypertension suggested on echocardiogram is likely multifactorial. Her walk test today shows no significant desaturation or oxygen requirement. Additionally, her previous spirometry shows no worsening in her FVC or evidence of airway obstruction suggestive of COPD. I suspect the moderate reduction in her carbon monoxide diffusion capacity is likely due to her underlying congestive heart failure. She is continuing on systemic  anticoagulation for her atrial fibrillation which will also treat her previous pulmonary embolism. She was not on anticoagulation at the time of her pulmonary embolism per her report. I instructed the patient to contact my office if she developed any new breathing problems or had questions as we would be happy to see her back.  1. Restrictive lung disease: Likely secondary to Congestive heart failure. No further testing at this time.  2. Pulmonary emboli:  Continuing on systemic anticoagulation with Coumadin for treatment of underlying atrial fibrillation. Recommended rescheduling hematology consultation appointment. 3. Pulmonary hypertension:  Likely  WHO Group 2 & 3. No indication for pulmonary arterial vasodilator therapy. 4. Pulmonary emphysema: Likely secondary to age and previous secondhand smoke exposure. Deferring further testing. 5. Lung nodule: Not present on CT imaging. No further imaging at this time.  6. Health maintenance: Status post pneumonia vaccine in August 2013. Deferring further immunizations to PCP. 7. Follow-up: Return to clinic as needed.   Donna Christen Jamison Neighbor, M.D. [facility] Pager:  [phone] After 3pm or if no response, call [phone] 9:48 AM 09/06/17

## 2017-09-06 NOTE — Patient Instructions (Addendum)
   Call our office if you have any new breathing problems or questions.   Strongly consider re-scheduling your appointment with Hematology to evaluate the blood clot in your lungs.  We will see you back as needed if you develop any new breathing problems.

## 2017-09-16 ENCOUNTER — Ambulatory Visit (INDEPENDENT_AMBULATORY_CARE_PROVIDER_SITE_OTHER): Payer: Medicare Other | Admitting: *Deleted

## 2017-09-16 DIAGNOSIS — Z7901 Long term (current) use of anticoagulants: Secondary | ICD-10-CM | POA: Diagnosis not present

## 2017-09-16 DIAGNOSIS — I4891 Unspecified atrial fibrillation: Secondary | ICD-10-CM

## 2017-09-16 DIAGNOSIS — Z5181 Encounter for therapeutic drug level monitoring: Secondary | ICD-10-CM | POA: Diagnosis not present

## 2017-09-16 LAB — POCT INR: INR: 5

## 2017-09-17 ENCOUNTER — Ambulatory Visit (INDEPENDENT_AMBULATORY_CARE_PROVIDER_SITE_OTHER): Payer: Medicare Other | Admitting: Family Medicine

## 2017-09-17 ENCOUNTER — Encounter: Payer: Self-pay | Admitting: Family Medicine

## 2017-09-17 VITALS — BP 138/70 | HR 76 | Temp 97.6°F | Resp 18 | Ht 66.0 in | Wt 170.0 lb

## 2017-09-17 DIAGNOSIS — I4891 Unspecified atrial fibrillation: Secondary | ICD-10-CM | POA: Diagnosis not present

## 2017-09-17 DIAGNOSIS — S81812D Laceration without foreign body, left lower leg, subsequent encounter: Secondary | ICD-10-CM | POA: Diagnosis not present

## 2017-09-17 DIAGNOSIS — E559 Vitamin D deficiency, unspecified: Secondary | ICD-10-CM | POA: Diagnosis not present

## 2017-09-17 DIAGNOSIS — I10 Essential (primary) hypertension: Secondary | ICD-10-CM

## 2017-09-17 NOTE — Progress Notes (Signed)
Chief Complaint  Patient presents with  . Follow-up    3 month  Patient is here for follow-up.  She has had a fall since her last visit.  She tripped going downstairs.  She has multiple aches and pains.  She went to the emergency room.  Nothing is broken.  She feels she is slowly improving.  She is not having any problems with bleeding or bruising. When getting out of her car 10 days ago, the car door caught her leg.  She went to the emergency room for sutures.  She needs to have these removed today. She complains of back pain.  This is a chronic complaint worse since she fell.  No radiation.  No numbness or weakness.  It is well controlled with Tylenol. She states she is not having any chest pain or shortness of breath.  She is tolerating her medications well.  Blood pressure is well controlled. She has a known vitamin D deficiency.  She took 50,000 units a week for 12 weeks, and wonders what to take now.  I told her she can transition to an over-the-counter 2000 units a day.  We will check her vitamin D with her next labs.  Patient Active Problem List   Diagnosis Date Noted  . Pulmonary emphysema (HCC) 09/06/2017  . Restrictive lung disease 07/02/2017  . Other secondary pulmonary hypertension (HCC) 07/02/2017  . Persistent atrial fibrillation (HCC)   . Esophageal dysphagia 02/03/2017  . Dizziness 01/07/2017  . Monitoring for long-term anticoagulant use 12/18/2016  . CKD (chronic kidney disease), stage III (HCC) 12/12/2016  . Seizure disorder (HCC) 12/12/2016  . Presbyesophagus 12/11/2016  . History of esophageal stricture 12/10/2016  . Dysphagia   . Atrial fibrillation with RVR (HCC) 12/01/2016  . Pulmonary emboli (HCC) 12/01/2016  . GERD (gastroesophageal reflux disease) 12/01/2016  . Arthralgia of both knees 01/11/2016  . Hypertension 09/22/2011    Outpatient Encounter Prescriptions as of 09/17/2017  Medication Sig  . amiodarone (PACERONE) 200 MG tablet Take 1 tablet (200  mg total) by mouth daily.  Marland Kitchen. atorvastatin (LIPITOR) 10 MG tablet Take 1 tablet (10 mg total) by mouth every evening.  . carbamazepine (TEGRETOL XR) 100 MG 12 hr tablet TAKE ONE TABLET BY MOUTH TWICE DAILY (MORNING ,BEDTIME)  . diltiazem (CARDIZEM CD) 240 MG 24 hr capsule Take 1 capsule (240 mg total) by mouth daily.  . furosemide (LASIX) 40 MG tablet Take 1 tablet (40 mg total) by mouth daily. Take second if needed  . pantoprazole (PROTONIX) 40 MG tablet Take 1 tablet (40 mg total) by mouth daily.  . potassium chloride (K-DUR,KLOR-CON) 10 MEQ tablet TAKE ONE TABLET BY MOUTH TWICE DAILY. (MORNING ,BEDTIME)  . Vitamin D, Ergocalciferol, (DRISDOL) 50000 units CAPS capsule TAKE ONE CAPSULE BY MOUTH EVERY 7 DAYS (FRIDAY MORNING)  . warfarin (COUMADIN) 2.5 MG tablet Take as directed by the anticoagulation clinic (Patient taking differently: Take 2.5-5 mg by mouth See admin instructions. Take 2 tablets (5 mg) on all days with the exception of Saturday & Tuesday ONLY take 1 tablet (2.5 mg))   No facility-administered encounter medications on file as of 09/17/2017.     Allergies  Allergen Reactions  . Codeine Nausea Only    Review of Systems  Constitutional: Negative for activity change, appetite change, fatigue and unexpected weight change.  HENT: Negative for congestion, dental problem, postnasal drip, rhinorrhea and trouble swallowing.   Eyes: Negative for photophobia, redness and visual disturbance.  Respiratory: Negative for cough  and shortness of breath.   Cardiovascular: Negative for chest pain, palpitations and leg swelling.  Gastrointestinal: Negative for abdominal distention, abdominal pain, constipation and diarrhea.  Genitourinary: Positive for frequency. Negative for difficulty urinating and vaginal bleeding.  Musculoskeletal: Positive for back pain. Negative for arthralgias.  Skin: Positive for wound.  Neurological: Negative for dizziness and headaches.  Psychiatric/Behavioral:  Negative for dysphoric mood and sleep disturbance. The patient is not nervous/anxious.     BP 138/70 (BP Location: Right Arm, Patient Position: Sitting, Cuff Size: Normal)   Pulse 76   Temp 97.6 F (36.4 C) (Temporal)   Resp 18   Ht 5\' 6"  (1.676 m)   Wt 170 lb 0.6 oz (77.1 kg)   SpO2 96%   BMI 27.45 kg/m   Physical Exam  Constitutional: She is oriented to person, place, and time. She appears well-developed and well-nourished.  HENT:  Head: Normocephalic and atraumatic.  Mouth/Throat: Oropharynx is clear and moist.  Eyes: Pupils are equal, round, and reactive to light. Conjunctivae are normal.  Neck: Normal range of motion. Neck supple. No thyromegaly present.  Cardiovascular: Normal rate, regular rhythm and normal heart sounds.   Heart reg with occas ectopy  Pulmonary/Chest: Effort normal and breath sounds normal. No respiratory distress. She has no rales.  clear  Abdominal: Soft. Bowel sounds are normal.  Musculoskeletal: Normal range of motion. She exhibits edema.  trace  Lymphadenopathy:    She has no cervical adenopathy.  Neurological: She is alert and oriented to person, place, and time.  Gait normal  Skin: Skin is warm and dry.  Skin dry, ankles hyperpigmented.  Posterior left calf has a healing irregular V-shaped laceration.  6 interrupted sutures are removed.  Steri-Strips are placed.  Wound care is discussed  Psychiatric: She has a normal mood and affect. Her behavior is normal. Thought content normal.  Nursing note and vitals reviewed.   ASSESSMENT/PLAN:  1. Laceration of left lower leg, subsequent encounter Discussed wound care  2. Essential hypertension Well-controlled - CBC - BASIC METABOLIC PANEL WITH GFR  3. Atrial fibrillation with RVR (HCC) Asymptomatic  4. Vitamin D deficiency Discussed replacement   Patient Instructions  Keep area of leg clean and dry let tapes fall off in a few days Call for problems or infection  No change in  medicines Make sure you are taking your vitamin D  (prevent falls!)  See me in 3 months Labs next time     Eustace Moore, MD

## 2017-09-17 NOTE — Patient Instructions (Addendum)
Keep area of leg clean and dry let tapes fall off in a few days Call for problems or infection  No change in medicines Make sure you are taking your vitamin D  (prevent falls!)  See me in 3 months Labs next time

## 2017-09-20 ENCOUNTER — Other Ambulatory Visit: Payer: Self-pay | Admitting: Cardiovascular Disease

## 2017-09-28 ENCOUNTER — Ambulatory Visit (INDEPENDENT_AMBULATORY_CARE_PROVIDER_SITE_OTHER): Payer: Medicare Other | Admitting: *Deleted

## 2017-09-28 DIAGNOSIS — Z7901 Long term (current) use of anticoagulants: Secondary | ICD-10-CM | POA: Diagnosis not present

## 2017-09-28 DIAGNOSIS — I4891 Unspecified atrial fibrillation: Secondary | ICD-10-CM

## 2017-09-28 DIAGNOSIS — Z5181 Encounter for therapeutic drug level monitoring: Secondary | ICD-10-CM

## 2017-09-28 LAB — POCT INR: INR: 1.9

## 2017-10-12 ENCOUNTER — Ambulatory Visit (INDEPENDENT_AMBULATORY_CARE_PROVIDER_SITE_OTHER): Payer: Medicare Other | Admitting: *Deleted

## 2017-10-12 DIAGNOSIS — I4891 Unspecified atrial fibrillation: Secondary | ICD-10-CM

## 2017-10-12 DIAGNOSIS — Z5181 Encounter for therapeutic drug level monitoring: Secondary | ICD-10-CM

## 2017-10-12 DIAGNOSIS — Z7901 Long term (current) use of anticoagulants: Secondary | ICD-10-CM | POA: Diagnosis not present

## 2017-10-12 LAB — POCT INR: INR: 2.5

## 2017-10-14 ENCOUNTER — Other Ambulatory Visit (HOSPITAL_COMMUNITY): Payer: Self-pay | Admitting: Nurse Practitioner

## 2017-10-15 ENCOUNTER — Encounter (HOSPITAL_COMMUNITY): Payer: Self-pay | Admitting: Oncology

## 2017-10-15 ENCOUNTER — Encounter (HOSPITAL_COMMUNITY): Payer: Medicare Other

## 2017-10-15 ENCOUNTER — Other Ambulatory Visit: Payer: Self-pay

## 2017-10-15 ENCOUNTER — Encounter (HOSPITAL_COMMUNITY): Payer: Medicare Other | Attending: Oncology | Admitting: Oncology

## 2017-10-15 VITALS — BP 161/66 | HR 60 | Temp 97.6°F | Resp 16 | Ht 66.0 in | Wt 171.0 lb

## 2017-10-15 DIAGNOSIS — Z79899 Other long term (current) drug therapy: Secondary | ICD-10-CM | POA: Diagnosis not present

## 2017-10-15 DIAGNOSIS — Z809 Family history of malignant neoplasm, unspecified: Secondary | ICD-10-CM | POA: Diagnosis not present

## 2017-10-15 DIAGNOSIS — Z811 Family history of alcohol abuse and dependence: Secondary | ICD-10-CM | POA: Diagnosis not present

## 2017-10-15 DIAGNOSIS — Z833 Family history of diabetes mellitus: Secondary | ICD-10-CM | POA: Insufficient documentation

## 2017-10-15 DIAGNOSIS — I4891 Unspecified atrial fibrillation: Secondary | ICD-10-CM | POA: Diagnosis not present

## 2017-10-15 DIAGNOSIS — Z8249 Family history of ischemic heart disease and other diseases of the circulatory system: Secondary | ICD-10-CM | POA: Insufficient documentation

## 2017-10-15 DIAGNOSIS — J439 Emphysema, unspecified: Secondary | ICD-10-CM | POA: Insufficient documentation

## 2017-10-15 DIAGNOSIS — I2699 Other pulmonary embolism without acute cor pulmonale: Secondary | ICD-10-CM

## 2017-10-15 DIAGNOSIS — Z823 Family history of stroke: Secondary | ICD-10-CM | POA: Diagnosis not present

## 2017-10-15 DIAGNOSIS — Z885 Allergy status to narcotic agent status: Secondary | ICD-10-CM | POA: Insufficient documentation

## 2017-10-15 DIAGNOSIS — Z7901 Long term (current) use of anticoagulants: Secondary | ICD-10-CM | POA: Insufficient documentation

## 2017-10-15 DIAGNOSIS — N183 Chronic kidney disease, stage 3 (moderate): Secondary | ICD-10-CM | POA: Diagnosis not present

## 2017-10-15 DIAGNOSIS — F419 Anxiety disorder, unspecified: Secondary | ICD-10-CM | POA: Diagnosis not present

## 2017-10-15 DIAGNOSIS — Z8673 Personal history of transient ischemic attack (TIA), and cerebral infarction without residual deficits: Secondary | ICD-10-CM | POA: Diagnosis not present

## 2017-10-15 DIAGNOSIS — K219 Gastro-esophageal reflux disease without esophagitis: Secondary | ICD-10-CM | POA: Diagnosis not present

## 2017-10-15 DIAGNOSIS — E785 Hyperlipidemia, unspecified: Secondary | ICD-10-CM | POA: Insufficient documentation

## 2017-10-15 DIAGNOSIS — Z7722 Contact with and (suspected) exposure to environmental tobacco smoke (acute) (chronic): Secondary | ICD-10-CM | POA: Insufficient documentation

## 2017-10-15 DIAGNOSIS — Z9889 Other specified postprocedural states: Secondary | ICD-10-CM | POA: Diagnosis not present

## 2017-10-15 DIAGNOSIS — I129 Hypertensive chronic kidney disease with stage 1 through stage 4 chronic kidney disease, or unspecified chronic kidney disease: Secondary | ICD-10-CM | POA: Diagnosis not present

## 2017-10-15 DIAGNOSIS — F329 Major depressive disorder, single episode, unspecified: Secondary | ICD-10-CM | POA: Insufficient documentation

## 2017-10-15 DIAGNOSIS — Z86711 Personal history of pulmonary embolism: Secondary | ICD-10-CM | POA: Diagnosis not present

## 2017-10-15 DIAGNOSIS — Z90710 Acquired absence of both cervix and uterus: Secondary | ICD-10-CM | POA: Diagnosis not present

## 2017-10-15 LAB — COMPREHENSIVE METABOLIC PANEL
ALBUMIN: 4.3 g/dL (ref 3.5–5.0)
ALK PHOS: 103 U/L (ref 38–126)
ALT: 17 U/L (ref 14–54)
AST: 23 U/L (ref 15–41)
Anion gap: 7 (ref 5–15)
BUN: 16 mg/dL (ref 6–20)
CALCIUM: 9 mg/dL (ref 8.9–10.3)
CO2: 22 mmol/L (ref 22–32)
CREATININE: 1.16 mg/dL — AB (ref 0.44–1.00)
Chloride: 110 mmol/L (ref 101–111)
GFR calc non Af Amer: 43 mL/min — ABNORMAL LOW (ref >60)
GFR, EST AFRICAN AMERICAN: 50 mL/min — AB (ref >60)
GLUCOSE: 93 mg/dL (ref 65–99)
Potassium: 4.3 mmol/L (ref 3.5–5.1)
SODIUM: 139 mmol/L (ref 135–145)
Total Bilirubin: 0.5 mg/dL (ref 0.3–1.2)
Total Protein: 7.6 g/dL (ref 6.5–8.1)

## 2017-10-15 LAB — CBC WITH DIFFERENTIAL/PLATELET
Basophils Absolute: 0 10*3/uL (ref 0.0–0.1)
Basophils Relative: 1 %
EOS ABS: 0.1 10*3/uL (ref 0.0–0.7)
Eosinophils Relative: 1 %
HCT: 40.4 % (ref 36.0–46.0)
HEMOGLOBIN: 13.2 g/dL (ref 12.0–15.0)
LYMPHS ABS: 1.4 10*3/uL (ref 0.7–4.0)
Lymphocytes Relative: 28 %
MCH: 31.2 pg (ref 26.0–34.0)
MCHC: 32.7 g/dL (ref 30.0–36.0)
MCV: 95.5 fL (ref 78.0–100.0)
Monocytes Absolute: 0.7 10*3/uL (ref 0.1–1.0)
Monocytes Relative: 13 %
NEUTROS ABS: 2.9 10*3/uL (ref 1.7–7.7)
NEUTROS PCT: 57 %
Platelets: 212 10*3/uL (ref 150–400)
RBC: 4.23 MIL/uL (ref 3.87–5.11)
RDW: 13.7 % (ref 11.5–15.5)
WBC: 5.1 10*3/uL (ref 4.0–10.5)

## 2017-10-15 LAB — D-DIMER, QUANTITATIVE: D-Dimer, Quant: 0.5 ug/mL-FEU (ref 0.00–0.50)

## 2017-10-15 NOTE — Progress Notes (Signed)
[facility] Initial Visit:  Patient Care Team: Raylene Everts, MD as PCP - General (Family Medicine)  CHIEF COMPLAINTS/PURPOSE OF CONSULTATION:  PE  HISTORY OF PRESENTING ILLNESS: Karen Richardson 83 y.o. female presents today for evaluation of pulmonary embolism.  Patient was hospitalized from 11/27/16 through 12/01/16 at [facility] for worsening cough and congestion.  At that time she was found to be in A. fib with RVR.  She was also noted to have elevated d-dimer and had a CTA scan performed which demonstrated right lower and right middle lobe pulmonary embolism with small effusion.  Patient states that prior to her admission to the hospital she has been immobilized due to generalized malaise at 4 weeks.  At that time patient was discharged on Tegretol and diltiazem for her A. fib and was placed on Lovenox with Coumadin for her PE.  Patient has been on Coumadin since then for her PE as well as for stroke prophylaxis for A. fib.  Her PCP has been monitoring her INR and she is therapeutic within the 2-3 range.  She denies any pleuritic chest pains today.  She states overall she feels well.  She denies any headache, palpitations, shortness of breath, abdominal pain, nausea, vomiting, diarrhea, focal weakness.  Review of Systems - Oncology ROS as per HPI otherwise 12 point ROS is negative.  MEDICAL HISTORY: Past Medical History:  Diagnosis Date  . Anxiety   . Atrial fibrillation with RVR ([place]) 12/01/2016  . CKD (chronic kidney disease), stage III ([place]) 12/12/2016  . Depression   . Dysphagia   . GERD (gastroesophageal reflux disease) 12/01/2016  . Heart murmur   . History of esophageal stricture 12/10/2016  . Hyperlipidemia   . Hypertension    x 4 months  . Presbyesophagus 12/11/2016  . Pulmonary emboli ([place]) 12/01/2016  . Pulmonary embolism ([place])   . Pulmonary emphysema ([place]) 09/06/2017  . Pulmonary nodule   . Seizures ([place])   . Stroke [facility])     SURGICAL  HISTORY: Past Surgical History:  Procedure Laterality Date  . ABDOMINAL HYSTERECTOMY    . BREAST ENHANCEMENT SURGERY     40 yrs ago  . CARDIOVERSION N/A 05/03/2017   Performed by Acie Fredrickson Wonda Cheng, MD at [facility] ENDOSCOPY  . ESOPHAGEAL DILATION N/A 03/17/2017   Performed by Rogene Houston, MD at [place]  . ESOPHAGOGASTRODUODENOSCOPY (EGD) N/A 03/17/2017   Performed by Rogene Houston, MD at [place]  . PARTIAL HYSTERECTOMY    . SPINE SURGERY     Tumor removal  . Tumor removed from spinal cord     benign    SOCIAL HISTORY: Social History   Socioeconomic History  . Marital status: Married    Spouse name: george  . Number of children: 4  . Years of education: 59  . Highest education level: Not on file  Social Needs  . Financial resource strain: Not on file  . Food insecurity - worry: Not on file  . Food insecurity - inability: Not on file  . Transportation needs - medical: Not on file  . Transportation needs - non-medical: Not on file  Occupational History  . Occupation: retired    Comment: Nurse, children's  Tobacco Use  . Smoking status: Passive Smoke Exposure - Never Smoker  . Smokeless tobacco: Never Used  . Tobacco comment: Father as a child   Substance and Sexual Activity  . Alcohol use: No  . Drug use: No  .  Sexual activity: Not Currently  Other Topics Concern  . Not on file  Social History Narrative   Lives with Iona Beard   many pets      [facility] (07/02/17):   Originally from [facility]. She has lived in [place], [place], [place]. Previously has been a homemaker primarily. Currently has 5 dogs & 2 cats. No bird or mold exposure.     FAMILY HISTORY Family History  Problem Relation Age of Onset  . Pulmonary embolism Mother   . Cancer Mother        mass in colon  . Stroke Father   . Alcohol abuse Father   . Diabetes Sister   . Heart failure Sister   . Brain cancer Sister   . Schizophrenia Brother   . Lung disease Neg Hx     ALLERGIES:  is allergic to  codeine.  MEDICATIONS:  Current Outpatient Medications  Medication Sig Dispense Refill  . amiodarone (PACERONE) 200 MG tablet TAKE ONE TABLET BY MOUTH DAILY. (MORNING) 30 tablet 0  . atorvastatin (LIPITOR) 10 MG tablet TAKE ONE TABLET BY MOUTH EVERY EVENING. 30 tablet 3  . carbamazepine (TEGRETOL XR) 100 MG 12 hr tablet TAKE ONE TABLET BY MOUTH TWICE DAILY (MORNING ,BEDTIME) 60 tablet 3  . diltiazem (TIAZAC) 240 MG 24 hr capsule TAKE ONE CAPSULE BY MOUTH DAILY. (MORNING) 30 capsule 0  . pantoprazole (PROTONIX) 40 MG tablet Take 1 tablet (40 mg total) by mouth daily. 90 tablet 3  . potassium chloride (K-DUR,KLOR-CON) 10 MEQ tablet TAKE ONE TABLET BY MOUTH TWICE DAILY. (MORNING ,BEDTIME) 60 tablet 3  . Vitamin D, Ergocalciferol, (DRISDOL) 50000 units CAPS capsule TAKE ONE CAPSULE BY MOUTH EVERY 7 DAYS (FRIDAY MORNING) 12 capsule 3  . warfarin (COUMADIN) 2.5 MG tablet Take as directed by the anticoagulation clinic (Patient taking differently: Take 2.5-5 mg by mouth See admin instructions. Take 2 tablets (5 mg) on all days with the exception of Saturday & Tuesday ONLY take 1 tablet (2.5 mg)) 90 tablet 3   No current facility-administered medications for this visit.     PHYSICAL EXAMINATION:  ECOG PERFORMANCE STATUS: 0 - Asymptomatic   Vitals:   10/15/17 1340  BP: (!) 161/66  Pulse: 60  Resp: 16  Temp: 97.6 F (36.4 C)  SpO2: 97%    Filed Weights   10/15/17 1340  Weight: 171 lb (77.6 kg)     Physical Exam Constitutional: Well-developed, well-nourished, and in no distress.   HENT:  Head: Normocephalic and atraumatic.  Mouth/Throat: No oropharyngeal exudate. Mucosa moist. Eyes: Pupils are equal, round, and reactive to light. Conjunctivae are normal. No scleral icterus.  Neck: Normal range of motion. Neck supple. No JVD present.  Cardiovascular: Normal rate, regular rhythm and normal heart sounds.  Exam reveals no gallop and no friction rub.   No murmur heard. Pulmonary/Chest:  Effort normal and breath sounds normal. No respiratory distress. No wheezes.No rales.  Abdominal: Soft. Bowel sounds are normal. No distension. There is no tenderness. There is no guarding.  Musculoskeletal: No edema or tenderness.  Lymphadenopathy:    No cervical or supraclavicular adenopathy.  Neurological: Alert and oriented to person, place, and time. No cranial nerve deficit.  Skin: Skin is warm and dry. No rash noted. No erythema. No pallor.  Psychiatric: Affect and judgment normal.     LABORATORY DATA: I have personally reviewed the data as listed:  Appointment on 10/15/2017  Component Date Value Ref Range Status  . WBC 10/15/2017 5.1  4.0 -  10.5 K/uL Final  . RBC 10/15/2017 4.23  3.87 - 5.11 MIL/uL Final  . Hemoglobin 10/15/2017 13.2  12.0 - 15.0 g/dL Final  . HCT 10/15/2017 40.4  36.0 - 46.0 % Final  . MCV 10/15/2017 95.5  78.0 - 100.0 fL Final  . MCH 10/15/2017 31.2  26.0 - 34.0 pg Final  . MCHC 10/15/2017 32.7  30.0 - 36.0 g/dL Final  . RDW 10/15/2017 13.7  11.5 - 15.5 % Final  . Platelets 10/15/2017 212  150 - 400 K/uL Final  . Neutrophils Relative % 10/15/2017 57  % Final  . Neutro Abs 10/15/2017 2.9  1.7 - 7.7 K/uL Final  . Lymphocytes Relative 10/15/2017 28  % Final  . Lymphs Abs 10/15/2017 1.4  0.7 - 4.0 K/uL Final  . Monocytes Relative 10/15/2017 13  % Final  . Monocytes Absolute 10/15/2017 0.7  0.1 - 1.0 K/uL Final  . Eosinophils Relative 10/15/2017 1  % Final  . Eosinophils Absolute 10/15/2017 0.1  0.0 - 0.7 K/uL Final  . Basophils Relative 10/15/2017 1  % Final  . Basophils Absolute 10/15/2017 0.0  0.0 - 0.1 K/uL Final  . Sodium 10/15/2017 139  135 - 145 mmol/L Final  . Potassium 10/15/2017 4.3  3.5 - 5.1 mmol/L Final  . Chloride 10/15/2017 110  101 - 111 mmol/L Final  . CO2 10/15/2017 22  22 - 32 mmol/L Final  . Glucose, Bld 10/15/2017 93  65 - 99 mg/dL Final  . BUN 10/15/2017 16  6 - 20 mg/dL Final  . Creatinine, Ser 10/15/2017 1.16* 0.44 - 1.00 mg/dL  Final  . Calcium 10/15/2017 9.0  8.9 - 10.3 mg/dL Final  . Total Protein 10/15/2017 7.6  6.5 - 8.1 g/dL Final  . Albumin 10/15/2017 4.3  3.5 - 5.0 g/dL Final  . AST 10/15/2017 23  15 - 41 U/L Final  . ALT 10/15/2017 17  14 - 54 U/L Final  . Alkaline Phosphatase 10/15/2017 103  38 - 126 U/L Final  . Total Bilirubin 10/15/2017 0.5  0.3 - 1.2 mg/dL Final  . GFR calc non Af Amer 10/15/2017 43* >60 mL/min Final  . GFR calc Af Amer 10/15/2017 50* >60 mL/min Final   Comment: (NOTE) The eGFR has been calculated using the CKD EPI equation. This calculation has not been validated in all clinical situations. eGFR's persistently <60 mL/min signify possible Chronic Kidney Disease.   . Anion gap 10/15/2017 7  5 - 15 Final  . D-Dimer, Quant 10/15/2017 0.50  0.00 - 0.50 ug/mL-FEU Final   Comment: (NOTE) At the manufacturer cut-off of 0.50 ug/mL FEU, this assay has been documented to exclude PE with a sensitivity and negative predictive value of 97 to 99%.  At this time, this assay has not been approved by the FDA to exclude DVT/VTE. Results should be correlated with clinical presentation.   Anti-coag visit on 10/12/2017  Component Date Value Ref Range Status  . INR 10/12/2017 2.5   Final  Anti-coag visit on 09/28/2017  Component Date Value Ref Range Status  . INR 09/28/2017 1.9   Final  Anti-coag visit on 09/16/2017  Component Date Value Ref Range Status  . INR 09/16/2017 5.0   Final    RADIOGRAPHIC STUDIES: I have personally reviewed the radiological images as listed and agree with the findings in the report  No results found.  ASSESSMENT/PLAN:  1. PE of the right lower and right middle lobe with right heart strain diagnosed on 11/28/16 -Her PE was  likely provoked due to immobilization due to her poor health leading up to her hospitalization. -Patient has been treated for almost 1 year now.  -Will repeat her CTA chest to assess for resolution of her PE. Will also check a d-dimer as  well.  -Will call her with the results of CTA and d-dimer.  -I see no added value in performing a full hypercoagulability workup since she will need to be on lifelong anticoagulation for her A.Fib, so it will not change her overall management regarding anticoagulation. -RTC as needed. We will call her with the CTA chest and lab results.   Orders Placed This Encounter  Procedures  . CT ANGIO CHEST PE W OR WO CONTRAST    Standing Status:   Future    Standing Expiration Date:   01/15/2019    Order Specific Question:   If indicated for the ordered procedure, I authorize the administration of contrast media per Radiology protocol    Answer:   Yes    Order Specific Question:   Preferred imaging location?    Answer:   [facility]    Order Specific Question:   Radiology Contrast Protocol - do NOT remove file path    Answer:   [email or web address]  . CBC with Differential    Standing Status:   Future    Number of Occurrences:   1    Standing Expiration Date:   10/15/2018  . Comprehensive metabolic panel    Standing Status:   Future    Number of Occurrences:   1    Standing Expiration Date:   10/15/2018  . D-dimer, quantitative    Standing Status:   Future    Number of Occurrences:   1    Standing Expiration Date:   10/15/2018    All questions were answered. The patient knows to call the clinic with any problems, questions or concerns.  This note was electronically signed.    Twana First, MD  10/15/2017 2:59 PM

## 2017-10-20 ENCOUNTER — Encounter (HOSPITAL_COMMUNITY): Payer: Self-pay

## 2017-10-20 ENCOUNTER — Ambulatory Visit (HOSPITAL_COMMUNITY)
Admission: RE | Admit: 2017-10-20 | Discharge: 2017-10-20 | Disposition: A | Discharge status: Home / Self Care | Payer: Medicare Other | Source: Ambulatory Visit | Attending: Oncology | Admitting: Oncology

## 2017-10-20 DIAGNOSIS — Z86711 Personal history of pulmonary embolism: Secondary | ICD-10-CM | POA: Insufficient documentation

## 2017-10-20 DIAGNOSIS — I700 Atherosclerosis of aorta: Secondary | ICD-10-CM | POA: Diagnosis not present

## 2017-10-20 DIAGNOSIS — Z7901 Long term (current) use of anticoagulants: Secondary | ICD-10-CM | POA: Diagnosis not present

## 2017-10-20 DIAGNOSIS — I2699 Other pulmonary embolism without acute cor pulmonale: Secondary | ICD-10-CM

## 2017-10-20 DIAGNOSIS — I4891 Unspecified atrial fibrillation: Secondary | ICD-10-CM | POA: Insufficient documentation

## 2017-10-20 DIAGNOSIS — I2510 Atherosclerotic heart disease of native coronary artery without angina pectoris: Secondary | ICD-10-CM | POA: Insufficient documentation

## 2017-10-20 DIAGNOSIS — Z9882 Breast implant status: Secondary | ICD-10-CM | POA: Insufficient documentation

## 2017-10-20 DIAGNOSIS — J432 Centrilobular emphysema: Secondary | ICD-10-CM | POA: Insufficient documentation

## 2017-10-20 MED ORDER — IOPAMIDOL (ISOVUE-370) INJECTION 76%
80.0000 mL | Freq: Once | INTRAVENOUS | Status: AC | PRN
  Administered 2017-10-20: 80 mL via INTRAVENOUS

## 2017-10-26 ENCOUNTER — Encounter: Payer: Self-pay | Admitting: Family Medicine

## 2017-10-27 ENCOUNTER — Telehealth: Payer: Self-pay | Admitting: *Deleted

## 2017-10-27 NOTE — Telephone Encounter (Signed)
[name] called for patient stating she has sent a mychart message regarding ct scan and patient having back problems. [name] states patient needs a referral for her left leg going numb. Please advise [phone]

## 2017-10-28 NOTE — Telephone Encounter (Signed)
She did  an e mail and it asked me to review her record but requested no specific action.  You can see her e mail to me.  Lanaja can come in if she is having questions or new problems

## 2017-10-28 NOTE — Telephone Encounter (Signed)
Called and spoke to Karen Richardson, aware dr Delton See is out of the office until next week.

## 2017-11-02 ENCOUNTER — Encounter: Payer: Self-pay | Admitting: Family Medicine

## 2017-11-02 ENCOUNTER — Ambulatory Visit (INDEPENDENT_AMBULATORY_CARE_PROVIDER_SITE_OTHER): Payer: Medicare Other | Admitting: *Deleted

## 2017-11-02 ENCOUNTER — Other Ambulatory Visit: Payer: Self-pay

## 2017-11-02 ENCOUNTER — Ambulatory Visit: Payer: Medicare Other | Admitting: Family Medicine

## 2017-11-02 VITALS — BP 138/82 | HR 64 | Temp 97.7°F | Resp 18 | Ht 66.0 in | Wt 169.1 lb

## 2017-11-02 DIAGNOSIS — R2681 Unsteadiness on feet: Secondary | ICD-10-CM | POA: Diagnosis not present

## 2017-11-02 DIAGNOSIS — I4891 Unspecified atrial fibrillation: Secondary | ICD-10-CM

## 2017-11-02 DIAGNOSIS — N3281 Overactive bladder: Secondary | ICD-10-CM

## 2017-11-02 DIAGNOSIS — Z7901 Long term (current) use of anticoagulants: Secondary | ICD-10-CM | POA: Diagnosis not present

## 2017-11-02 DIAGNOSIS — Z5181 Encounter for therapeutic drug level monitoring: Secondary | ICD-10-CM

## 2017-11-02 DIAGNOSIS — M8000XA Age-related osteoporosis with current pathological fracture, unspecified site, initial encounter for fracture: Secondary | ICD-10-CM

## 2017-11-02 DIAGNOSIS — R296 Repeated falls: Secondary | ICD-10-CM

## 2017-11-02 LAB — POCT INR: INR: 2.3

## 2017-11-02 MED ORDER — OXYBUTYNIN CHLORIDE 5 MG PO TABS: ORAL_TABLET | ORAL | 11 refills | Status: DC

## 2017-11-02 NOTE — Progress Notes (Signed)
Chief Complaint  Patient presents with  . Results  Patient is here at the request of her daughter.  Her daughter, who lives out of town, reviews her my chart and noticed abnormalities on her recent CAT scan.  The CAT scan was done by Dr. Janyth ContesZhou in oncology.  She was evaluating the patient's past history of pulmonary embolus.  She did have a CAT scan of her chest on several months ago.  This was done by her pulmonary doctor.  There is a definite change between the last CAT scan and this CAT scan.  She has 2 thoracic compression fractures.  She has had a couple falls in the interim.  We discussed that this does indicate she has osteoporosis and will benefit from osteoporosis treatment.  We discussed calcium, vitamin D, weightbearing exercise, and medication. Patient does not feel able to do exercise because of general weakness and balance problems.  With her 2 falls, I do feel it is prudent to enroll her in physical therapy for strengthening and gait stability.  The patient is motivated to exercise and get better. She asks about her kidney disease.  I told her that this is been stable over the years, is actually improved on her last lab test.  We discussed avoiding nonsteroidal anti-inflammatory drugs, she knows this because of her anticoagulation.  She should drink plenty of water. Her only new complaint is that she gets up 6 times a night to urinate.  This is been going on for many months to year.  No burning, or urinary infection symptoms.  She has known atrophy from her early hysterectomy.  She is willing to try medication.  She is also advised to limit her water intake after 6 PM. Patient Active Problem List   Diagnosis Date Noted  . Pulmonary emphysema ([facility]) 09/06/2017  . Restrictive lung disease 07/02/2017  . Other secondary pulmonary hypertension ([facility]) 07/02/2017  . Persistent atrial fibrillation ([facility])   . Esophageal dysphagia 02/03/2017  . Dizziness 01/07/2017  . Monitoring for long-term  anticoagulant use 12/18/2016  . CKD (chronic kidney disease), stage III ([facility]) 12/12/2016  . Seizure disorder ([facility]) 12/12/2016  . Presbyesophagus 12/11/2016  . History of esophageal stricture 12/10/2016  . Dysphagia   . Atrial fibrillation with RVR ([facility]) 12/01/2016  . Pulmonary emboli ([facility]) 12/01/2016  . GERD (gastroesophageal reflux disease) 12/01/2016  . Arthralgia of both knees 01/11/2016  . Hypertension 09/22/2011    Outpatient Encounter Medications as of 11/02/2017  Medication Sig  . amiodarone (PACERONE) 200 MG tablet TAKE ONE TABLET BY MOUTH DAILY. (MORNING)  . atorvastatin (LIPITOR) 10 MG tablet TAKE ONE TABLET BY MOUTH EVERY EVENING.  . carbamazepine (TEGRETOL XR) 100 MG 12 hr tablet TAKE ONE TABLET BY MOUTH TWICE DAILY (MORNING ,BEDTIME)  . diltiazem (TIAZAC) 240 MG 24 hr capsule TAKE ONE CAPSULE BY MOUTH DAILY. (MORNING)  . pantoprazole (PROTONIX) 40 MG tablet Take 1 tablet (40 mg total) by mouth daily.  . potassium chloride (K-DUR,KLOR-CON) 10 MEQ tablet TAKE ONE TABLET BY MOUTH TWICE DAILY. (MORNING ,BEDTIME)  . Vitamin D, Ergocalciferol, (DRISDOL) 50000 units CAPS capsule TAKE ONE CAPSULE BY MOUTH EVERY 7 DAYS (FRIDAY MORNING)  . warfarin (COUMADIN) 2.5 MG tablet Take as directed by the anticoagulation clinic (Patient taking differently: Take 2.5-5 mg by mouth See admin instructions. Take 2 tablets (5 mg) on all days with the exception of Saturday & Tuesday ONLY take 1 tablet (2.5 mg))  . oxybutynin (DITROPAN) 5 MG tablet One or two  pills at bedtime   No facility-administered encounter medications on file as of 11/02/2017.     Allergies  Allergen Reactions  . Codeine Nausea Only    Review of Systems  Constitutional: Positive for fatigue. Negative for activity change, appetite change and unexpected weight change.  HENT: Negative for congestion, dental problem, postnasal drip, rhinorrhea and trouble swallowing.   Eyes: Negative for photophobia, redness and visual  disturbance.  Respiratory: Negative for cough and shortness of breath.   Cardiovascular: Negative for chest pain, palpitations and leg swelling.  Gastrointestinal: Negative for abdominal distention, abdominal pain, constipation and diarrhea.  Genitourinary: Positive for frequency. Negative for difficulty urinating and vaginal bleeding.       Nocturia x6  Musculoskeletal: Positive for back pain and gait problem. Negative for arthralgias.  Skin: Negative for wound.  Neurological: Positive for weakness. Negative for dizziness and headaches.       General weakness, poor balance  Psychiatric/Behavioral: Negative for dysphoric mood and sleep disturbance. The patient is not nervous/anxious.     BP 138/82   Pulse 64   Temp 97.7 F (36.5 C) (Temporal)   Resp 18   Ht 5\' 6"  (1.676 m)   Wt 169 lb 1.9 oz (76.7 kg)   SpO2 95%   BMI 27.30 kg/m   Physical Exam  Constitutional: She is oriented to person, place, and time. She appears well-developed and well-nourished.  HENT:  Head: Normocephalic and atraumatic.  Mouth/Throat: Oropharynx is clear and moist.  Eyes: Conjunctivae are normal. Pupils are equal, round, and reactive to light.  Neck: Normal range of motion. Neck supple. No thyromegaly present.  Cardiovascular: Normal rate, regular rhythm and normal heart sounds.  Heart reg with occas ectopy  Pulmonary/Chest: Effort normal and breath sounds normal. No respiratory distress. She has no rales.  clear  Abdominal: Soft. Bowel sounds are normal.  Musculoskeletal: Normal range of motion. She exhibits no edema.  Lymphadenopathy:    She has no cervical adenopathy.  Neurological: She is alert and oriented to person, place, and time.  Cannot stand up out of a chair without assistance.  Fails "get up and go" test.  No specific focal weakness.  Skin: Skin is warm and dry.  Skin dry, ankles hyperpigmented.  Posterior left calf has a healed laceration.  Mild hypertrophic scar.  No open areas    Psychiatric: She has a normal mood and affect. Her behavior is normal. Thought content normal.  Nursing note and vitals reviewed.   ASSESSMENT/PLAN:  1. OAB (overactive bladder) And Ditropan 5-10 mg at night  2. Gait instability Refer to physical therapy for strengthening and exercise - Ambulatory referral to Physical Therapy  3. Falls frequently As above - Ambulatory referral to Physical Therapy  4. Age-related osteoporosis with current pathological fracture, initial encounter Thoracic fractures.  New since prior CAT scan, likely from 1 of her falls.  Needs osteoporosis screening and treatment.  Interestingly, patient has no thoracic pain complaint   Patient Instructions  Need treatment for bone loss / osteoporosis Limit heavy lifting and bending I have ordered physical therapy to help with strengthening and to prevent falls  Take the ditropan at bedtime for the overactive bladder  I will contact your daughter with an update   Eustace Moore, MD

## 2017-11-02 NOTE — Patient Instructions (Signed)
Need treatment for bone loss / osteoporosis Limit heavy lifting and bending I have ordered physical therapy to help with strengthening and to prevent falls  Take the ditropan at bedtime for the overactive bladder  I will contact your daughter with an update

## 2017-11-10 ENCOUNTER — Telehealth (HOSPITAL_COMMUNITY): Payer: Self-pay | Admitting: Oncology

## 2017-11-10 NOTE — Telephone Encounter (Signed)
Emailed [employer]

## 2017-11-15 ENCOUNTER — Other Ambulatory Visit: Payer: Self-pay | Admitting: Family Medicine

## 2017-11-16 ENCOUNTER — Ambulatory Visit (HOSPITAL_COMMUNITY): Payer: Medicare Other | Admitting: Nurse Practitioner

## 2017-11-16 NOTE — Telephone Encounter (Signed)
Seen 12 4 18 

## 2017-11-18 ENCOUNTER — Telehealth: Payer: Self-pay | Admitting: Family Medicine

## 2017-11-18 NOTE — Telephone Encounter (Signed)
Called liz, they are going to refax the form.

## 2017-11-18 NOTE — Telephone Encounter (Signed)
Karen Richardson, [facility], let message on nurse line regarding patient. She states that she faxed a signed initial evaluation on 11/12/17. She is needing it signed and faxed back. Please call if she needs to resend it.  Callback# [phone] Fax# [phone]

## 2017-11-25 DIAGNOSIS — M25562 Pain in left knee: Secondary | ICD-10-CM | POA: Diagnosis not present

## 2017-11-25 DIAGNOSIS — M25662 Stiffness of left knee, not elsewhere classified: Secondary | ICD-10-CM | POA: Diagnosis not present

## 2017-11-25 DIAGNOSIS — Z9181 History of falling: Secondary | ICD-10-CM | POA: Diagnosis not present

## 2017-11-25 DIAGNOSIS — R2681 Unsteadiness on feet: Secondary | ICD-10-CM | POA: Diagnosis not present

## 2017-11-25 DIAGNOSIS — M6281 Muscle weakness (generalized): Secondary | ICD-10-CM | POA: Diagnosis not present

## 2017-11-25 DIAGNOSIS — R262 Difficulty in walking, not elsewhere classified: Secondary | ICD-10-CM | POA: Diagnosis not present

## 2017-11-30 HISTORY — PX: CATARACT EXTRACTION W/ INTRAOCULAR LENS IMPLANT: SHX1309

## 2017-12-02 ENCOUNTER — Ambulatory Visit (INDEPENDENT_AMBULATORY_CARE_PROVIDER_SITE_OTHER): Payer: Medicare Other | Admitting: *Deleted

## 2017-12-02 DIAGNOSIS — I4891 Unspecified atrial fibrillation: Secondary | ICD-10-CM

## 2017-12-02 DIAGNOSIS — Z7901 Long term (current) use of anticoagulants: Secondary | ICD-10-CM | POA: Diagnosis not present

## 2017-12-02 DIAGNOSIS — Z5181 Encounter for therapeutic drug level monitoring: Secondary | ICD-10-CM | POA: Diagnosis not present

## 2017-12-02 LAB — POCT INR: INR: 2.5

## 2017-12-02 NOTE — Patient Instructions (Signed)
Continue coumadin 1 tablet daily except 1 1/2 tablets on Tuesdays Do 2 - 3 serving of greens a week  Recheck in 4 weeks

## 2017-12-07 ENCOUNTER — Other Ambulatory Visit (HOSPITAL_COMMUNITY): Payer: Self-pay | Admitting: Nurse Practitioner

## 2017-12-10 ENCOUNTER — Other Ambulatory Visit: Payer: Self-pay | Admitting: Cardiovascular Disease

## 2017-12-17 ENCOUNTER — Ambulatory Visit (INDEPENDENT_AMBULATORY_CARE_PROVIDER_SITE_OTHER): Payer: Medicare Other | Admitting: Family Medicine

## 2017-12-17 ENCOUNTER — Encounter: Payer: Self-pay | Admitting: Family Medicine

## 2017-12-17 ENCOUNTER — Other Ambulatory Visit: Payer: Self-pay

## 2017-12-17 VITALS — BP 132/80 | HR 68 | Temp 98.0°F | Resp 18 | Ht 66.0 in | Wt 160.0 lb

## 2017-12-17 DIAGNOSIS — I4891 Unspecified atrial fibrillation: Secondary | ICD-10-CM

## 2017-12-17 DIAGNOSIS — I10 Essential (primary) hypertension: Secondary | ICD-10-CM

## 2017-12-17 DIAGNOSIS — Z659 Problem related to unspecified psychosocial circumstances: Secondary | ICD-10-CM

## 2017-12-17 NOTE — Patient Instructions (Signed)
See me every 3 months You don't need to go back to the A fib clinic See Dr Purvis Sheffield for routine follow up  Need INR tests every month
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
## 2017-12-24 ENCOUNTER — Telehealth: Payer: Self-pay | Admitting: Family Medicine

## 2017-12-24 NOTE — Telephone Encounter (Signed)
Dr. Rikki Spearing with [facility] called in to speak to a nurse. He states that he saw patient in the home as part of her insurance coverage. He states that he is just calling with a couple concerns. He wants to stress that his job is not to take over patient care or to order and manage labs, but to support the patient in their care.  He states that his first concern is the patient's complaint in change of skin color. He feels this is a side effect of her amiodarone. He states he is not sure if Dr. Delton See is the prescribing doctor, but as the patients PCP, he feels he should inform her.   His next concern is that of her gait. He states she is unsteady and has recurrent falls.  He is also concerned about the neuropathy she is exhibiting. He does not see where she is diabetic, also believes this to be caused by amiodarone.   He has treated her for a fungal infection on her legs.  He asked that I relay this message to Dr. Delton See as well as provide her with his cell phone number so that she can communicate with him as needed.  He will be forwarding his note from his visit to this office, but he states he feels best if he calls in with a report prior to faxing his notes.   Callback# [phone]

## 2017-12-28 ENCOUNTER — Other Ambulatory Visit (HOSPITAL_COMMUNITY): Payer: Self-pay | Admitting: Nurse Practitioner

## 2017-12-29 NOTE — Telephone Encounter (Signed)
I am forwarding this note to Rudi Coco, NP, in cardiology.  They prescribe the amiodarone

## 2017-12-29 NOTE — Telephone Encounter (Signed)
Noted  

## 2017-12-30 ENCOUNTER — Ambulatory Visit (INDEPENDENT_AMBULATORY_CARE_PROVIDER_SITE_OTHER): Payer: Medicare Other | Admitting: *Deleted

## 2017-12-30 DIAGNOSIS — Z7901 Long term (current) use of anticoagulants: Secondary | ICD-10-CM | POA: Diagnosis not present

## 2017-12-30 DIAGNOSIS — I4891 Unspecified atrial fibrillation: Secondary | ICD-10-CM

## 2017-12-30 DIAGNOSIS — Z5181 Encounter for therapeutic drug level monitoring: Secondary | ICD-10-CM

## 2017-12-30 DIAGNOSIS — I2782 Chronic pulmonary embolism: Secondary | ICD-10-CM

## 2017-12-30 LAB — POCT INR: INR: 3.3

## 2017-12-30 NOTE — Telephone Encounter (Signed)
Pt has appt with Randall An, PA-C on 2/14 @ 3:30 pm. Pt notified of appt and voiced understanding

## 2017-12-30 NOTE — Telephone Encounter (Signed)
Pt has appt with Dr. Collins Scotland on 3/5 - will forward to triage to see if either appt with him can be moved up or if she can see APP in [place] within the next week or so.

## 2017-12-30 NOTE — Patient Instructions (Signed)
Hold coumadin tonight then resume 1 tablet daily except 1 1/2 tablets on Tuesdays Do 2 - 3 serving of greens a week  Recheck in 4 weeks

## 2018-01-11 ENCOUNTER — Other Ambulatory Visit: Payer: Self-pay | Admitting: Cardiovascular Disease

## 2018-01-11 ENCOUNTER — Other Ambulatory Visit: Payer: Self-pay | Admitting: Family Medicine

## 2018-01-11 ENCOUNTER — Encounter: Payer: Self-pay | Admitting: Student

## 2018-01-11 ENCOUNTER — Encounter: Payer: Self-pay | Admitting: Family Medicine

## 2018-01-11 ENCOUNTER — Telehealth: Payer: Self-pay | Admitting: Family Medicine

## 2018-01-11 NOTE — Telephone Encounter (Signed)
I got a letter from the home doctor.  He indicated there might be medical problems related to her amiodarone use.  This is not a medication that I prescribed or manage for her.  I forwarded his concerns to her cardiologist for attention.

## 2018-01-11 NOTE — Telephone Encounter (Signed)
Dr from [employer] came to see her at home, he advised that the medication could be causing her legs to look rusty, and he did a pin test on her legs and she couldn't feel anything.--She is questioning who the Dr sent the information to. And what needs to be done, she is concerned about the medicine, and her Mothers legs. [phone] [name].

## 2018-01-11 NOTE — Telephone Encounter (Signed)
Seen 12/17/17

## 2018-01-12 ENCOUNTER — Other Ambulatory Visit: Payer: Self-pay | Admitting: Family Medicine

## 2018-01-12 DIAGNOSIS — M25561 Pain in right knee: Secondary | ICD-10-CM

## 2018-01-12 DIAGNOSIS — M25562 Pain in left knee: Principal | ICD-10-CM

## 2018-01-12 NOTE — Telephone Encounter (Signed)
Left message for Karen Richardson to call office,
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
## 2018-01-13 ENCOUNTER — Ambulatory Visit: Payer: Medicare Other | Admitting: Student

## 2018-01-13 ENCOUNTER — Encounter: Payer: Self-pay | Admitting: Student

## 2018-01-13 ENCOUNTER — Ambulatory Visit (INDEPENDENT_AMBULATORY_CARE_PROVIDER_SITE_OTHER): Payer: Medicare Other | Admitting: Student

## 2018-01-13 VITALS — BP 150/88 | HR 94 | Ht 66.0 in | Wt 165.4 lb

## 2018-01-13 DIAGNOSIS — Z7901 Long term (current) use of anticoagulants: Secondary | ICD-10-CM | POA: Diagnosis not present

## 2018-01-13 DIAGNOSIS — I10 Essential (primary) hypertension: Secondary | ICD-10-CM

## 2018-01-13 DIAGNOSIS — N183 Chronic kidney disease, stage 3 unspecified: Secondary | ICD-10-CM

## 2018-01-13 DIAGNOSIS — I4819 Other persistent atrial fibrillation: Secondary | ICD-10-CM

## 2018-01-13 DIAGNOSIS — Z86711 Personal history of pulmonary embolism: Secondary | ICD-10-CM

## 2018-01-13 DIAGNOSIS — I481 Persistent atrial fibrillation: Secondary | ICD-10-CM

## 2018-01-13 NOTE — Patient Instructions (Addendum)
Medication Instructions:  Your physician has recommended you make the following change in your medication:  STOP Taking Amiodarone    Labwork: NONE   Testing/Procedures: NONE   Follow-Up: Your physician wants you to follow-up in: 6 Months with Dr. Purvis Sheffield.  You will receive a reminder letter in the mail two months in advance. If you don't receive a letter, please call our office to schedule the follow-up appointment.   Any Other Special Instructions Will Be Listed Below (If Applicable).     If you need a refill on your cardiac medications before your next appointment, please call your pharmacy. Thank you for choosing [facility]!

## 2018-01-26 ENCOUNTER — Ambulatory Visit (INDEPENDENT_AMBULATORY_CARE_PROVIDER_SITE_OTHER): Payer: Medicare Other

## 2018-01-26 ENCOUNTER — Encounter (INDEPENDENT_AMBULATORY_CARE_PROVIDER_SITE_OTHER): Payer: Self-pay | Admitting: Orthopaedic Surgery

## 2018-01-26 ENCOUNTER — Ambulatory Visit (INDEPENDENT_AMBULATORY_CARE_PROVIDER_SITE_OTHER): Payer: Medicare Other | Admitting: Orthopaedic Surgery

## 2018-01-26 VITALS — BP 152/81 | HR 64 | Resp 16 | Ht 65.0 in | Wt 162.0 lb

## 2018-01-26 DIAGNOSIS — M545 Low back pain: Secondary | ICD-10-CM

## 2018-01-26 DIAGNOSIS — M25552 Pain in left hip: Secondary | ICD-10-CM | POA: Diagnosis not present

## 2018-01-26 NOTE — Progress Notes (Signed)
Office Visit Note   Patient: Karen Richardson           Date of Birth: June 16, 1934           MRN: [id number] Visit Date: 01/26/2018              Requested by: Eustace Moore, MD [phone] [place], [place] [address] PCP: Eustace Moore, MD   Assessment & Plan: Visit Diagnoses:  1. Acute low back pain, unspecified back pain laterality, with sciatica presence unspecified   2. Pain of left hip joint     Plan: Long discussion regarding potential diagnosis. There is a suggestion that she might have a fracture at the inferior pubic rami left hip. She actually is doing well just using a 4 point cane that she's been using on a chronic basis because of an issue she has with her balance. There is some arthritic change in the left hip when she may have aggravated. Activity to tolerance continue with a 4. cane I would like to. see her again in 2 or 3 weeks if she still having a problem and repeat the filmsOf her pelvis. I did not think films of her knees, left shoulder or lumbar spine were necessary today total time spent proximal 45 minutes  Follow-Up Instructions: Return in about 3 weeks (around 02/16/2018).   Orders:  Orders Placed This Encounter  Procedures  . XR HIP UNILAT W OR W/O PELVIS 1V LEFT  . XR Lumbar Spine 2-3 Views   No orders of the defined types were placed in this encounter.     Procedures: No procedures performed   Clinical Data: No additional findings.   Subjective: Chief Complaint  Patient presents with  . Left Knee - Pain    Karen Richardson is 84 yo who fell on left side 8 days ago and is having left hip, left knee pain.   Karen Richardson relates that she's had a chronic problem with balance and oftentimes falls. She does use a 4. cane. 8 days ago she fell and is complaining of some pain in the area of her left hip. She also was initially having some trouble with her left shoulder which has resolved in both of her knees. Presently her knees are not a problem.  She's not having a difficulty raising her left arm overhead and never noticed any skin changes. She does have a little bit discomfort in the area of her left groin when she bears weight. She does have history of A. fib and history of balance issues and does use a 4. cane. No numbness or tingling.  HPI  Review of Systems  Constitutional: Positive for chills and fatigue. Negative for fever.  HENT: Positive for ear pain and tinnitus.   Eyes: Negative.  Negative for itching.  Respiratory: Negative.  Negative for chest tightness and shortness of breath.   Cardiovascular: Negative.  Negative for chest pain, palpitations and leg swelling.  Gastrointestinal: Negative.  Negative for blood in stool, constipation and diarrhea.  Musculoskeletal: Positive for back pain and joint swelling. Negative for neck pain and neck stiffness.  Neurological: Positive for dizziness, weakness and numbness. Negative for headaches.  Hematological: Bruises/bleeds easily.  Psychiatric/Behavioral: Negative for sleep disturbance. The patient is not nervous/anxious.      Objective: Vital Signs: BP (!) 152/81   Pulse 64   Resp 16   Ht 5\' 5"  (1.651 m)   Wt 162 lb (73.5 kg)   BMI 26.96  kg/m   Physical Exam  Ortho ExamAwake alert and oriented 3. Comfortable sitting. No percussible tenderness of the lumbar spine. No pain around the sacrum or the buttock. No specific knee pain effusion or medial lateral joint discomfort. No knee instability. No distal edema. Some pain with internal/external rotation of her left hip more medial than lateral. But is able to flex and extend actively without much trouble. Straight leg raise is negative. Able to place her left arm quickly overhead with negative impingement. No skin changes.  Specialty Comments:  No specialty comments available.  Imaging: Xr Hip Unilat W Or W/o Pelvis 1v Left  Result Date: 01/26/2018 AP pelvis and lateral left hip were obtained. There is no evidence of an  obvious acute injury to the hip. There might be a nondisplaced crack through the inferior pubic rami near its junction with the pubis. Surely not displaced. There is a small femoral head spur consistent with some mild arthritis. The joint space is well-maintained    PMFS History: Patient Active Problem List   Diagnosis Date Noted  . Other social stressor 12/17/2017  . Pulmonary emphysema (HCC) 09/06/2017  . Restrictive lung disease 07/02/2017  . Other secondary pulmonary hypertension (HCC) 07/02/2017  . Persistent atrial fibrillation (HCC)   . Esophageal dysphagia 02/03/2017  . Dizziness 01/07/2017  . Monitoring for long-term anticoagulant use 12/18/2016  . CKD (chronic kidney disease), stage III (HCC) 12/12/2016  . Seizure disorder (HCC) 12/12/2016  . Presbyesophagus 12/11/2016  . History of esophageal stricture 12/10/2016  . Dysphagia   . Atrial fibrillation with RVR (HCC) 12/01/2016  . Pulmonary emboli (HCC) 12/01/2016  . GERD (gastroesophageal reflux disease) 12/01/2016  . Arthralgia of both knees 01/11/2016  . Hypertension 09/22/2011   Past Medical History:  Diagnosis Date  . Anxiety   . Atrial fibrillation with RVR (HCC) 12/01/2016  . CKD (chronic kidney disease), stage III (HCC) 12/12/2016  . Depression   . Dysphagia   . GERD (gastroesophageal reflux disease) 12/01/2016  . Heart murmur   . History of esophageal stricture 12/10/2016  . Hyperlipidemia   . Hypertension    x 4 months  . Presbyesophagus 12/11/2016  . Pulmonary emboli (HCC) 12/01/2016  . Pulmonary embolism (HCC)   . Pulmonary emphysema (HCC) 09/06/2017  . Pulmonary nodule   . Seizures (HCC)   . Stroke [facility])     Family History  Problem Relation Age of Onset  . Pulmonary embolism Mother   . Cancer Mother        mass in colon  . Stroke Father   . Alcohol abuse Father   . Diabetes Sister   . Heart failure Sister   . Brain cancer Sister   . Schizophrenia Brother   . Lung disease Neg Hx     Past Surgical  History:  Procedure Laterality Date  . ABDOMINAL HYSTERECTOMY    . BREAST ENHANCEMENT SURGERY     40 yrs ago  . CARDIOVERSION N/A 05/03/2017   Procedure: CARDIOVERSION;  Surgeon: Elease Hashimoto Deloris Ping, MD;  Location[facility] ENDOSCOPY;  Service: Cardiovascular;  Laterality: N/A;  . ESOPHAGEAL DILATION N/A 03/17/2017   Procedure: ESOPHAGEAL DILATION;  Surgeon: Malissa Hippo, MD;  Location: [facility] ENDO SUITE;  Service: Endoscopy;  Laterality: N/A;  . ESOPHAGOGASTRODUODENOSCOPY N/A 03/17/2017   Procedure: ESOPHAGOGASTRODUODENOSCOPY (EGD);  Surgeon: Malissa Hippo, MD;  Location: [facility] ENDO SUITE;  Service: Endoscopy;  Laterality: N/A;  10:30  . PARTIAL HYSTERECTOMY    . SPINE SURGERY     Tumor  removal  . Tumor removed from spinal cord     benign   Social History   Occupational History  . Occupation: retired    Comment: Neurosurgeon  Tobacco Use  . Smoking status: Passive Smoke Exposure - Never Smoker  . Smokeless tobacco: Never Used  . Tobacco comment: Father as a child   Substance and Sexual Activity  . Alcohol use: No  . Drug use: No  . Sexual activity: Not Currently

## 2018-01-27 ENCOUNTER — Ambulatory Visit (INDEPENDENT_AMBULATORY_CARE_PROVIDER_SITE_OTHER): Payer: Medicare Other | Admitting: *Deleted

## 2018-01-27 DIAGNOSIS — Z7901 Long term (current) use of anticoagulants: Secondary | ICD-10-CM | POA: Diagnosis not present

## 2018-01-27 DIAGNOSIS — I4891 Unspecified atrial fibrillation: Secondary | ICD-10-CM | POA: Diagnosis not present

## 2018-01-27 DIAGNOSIS — Z5181 Encounter for therapeutic drug level monitoring: Secondary | ICD-10-CM | POA: Diagnosis not present

## 2018-01-27 LAB — POCT INR: INR: 3

## 2018-01-27 NOTE — Patient Instructions (Signed)
Continue coumadin 1 tablet daily except 1 1/2 tablets on Tuesdays Do 2 - 3 serving of greens a week  Amiodarone 200mg  was stopped on 01/13/18 Recheck in 4 weeks

## 2018-02-01 ENCOUNTER — Other Ambulatory Visit (HOSPITAL_COMMUNITY): Payer: Self-pay | Admitting: Nurse Practitioner

## 2018-02-01 ENCOUNTER — Ambulatory Visit: Payer: Medicare Other | Admitting: Cardiovascular Disease

## 2018-02-15 ENCOUNTER — Other Ambulatory Visit: Payer: Self-pay | Admitting: Cardiovascular Disease

## 2018-02-24 ENCOUNTER — Ambulatory Visit (INDEPENDENT_AMBULATORY_CARE_PROVIDER_SITE_OTHER): Payer: Medicare Other | Admitting: *Deleted

## 2018-02-24 DIAGNOSIS — Z7901 Long term (current) use of anticoagulants: Secondary | ICD-10-CM | POA: Diagnosis not present

## 2018-02-24 DIAGNOSIS — Z5181 Encounter for therapeutic drug level monitoring: Secondary | ICD-10-CM

## 2018-02-24 DIAGNOSIS — I4891 Unspecified atrial fibrillation: Secondary | ICD-10-CM | POA: Diagnosis not present

## 2018-02-24 LAB — POCT INR: INR: 1.9

## 2018-02-24 NOTE — Patient Instructions (Signed)
Take coumadin 1 1/2 tablets tonight then resume 1 tablet daily except 1 1/2 tablets on Tuesdays Do 2 - 3 serving of greens a week  Amiodarone 200mg  was stopped on 01/13/18 Recheck in 3 weeks

## 2018-02-28 DIAGNOSIS — H2511 Age-related nuclear cataract, right eye: Secondary | ICD-10-CM | POA: Diagnosis not present

## 2018-02-28 DIAGNOSIS — H401132 Primary open-angle glaucoma, bilateral, moderate stage: Secondary | ICD-10-CM | POA: Diagnosis not present

## 2018-02-28 DIAGNOSIS — H2513 Age-related nuclear cataract, bilateral: Secondary | ICD-10-CM | POA: Diagnosis not present

## 2018-03-04 ENCOUNTER — Other Ambulatory Visit: Payer: Self-pay | Admitting: *Deleted

## 2018-03-04 ENCOUNTER — Telehealth: Payer: Self-pay | Admitting: Family Medicine

## 2018-03-04 MED ORDER — DILTIAZEM HCL ER BEADS 240 MG PO CP24: ORAL_CAPSULE | ORAL | 1 refills | Status: DC

## 2018-03-09 ENCOUNTER — Other Ambulatory Visit: Payer: Self-pay | Admitting: Family Medicine

## 2018-03-16 ENCOUNTER — Ambulatory Visit (INDEPENDENT_AMBULATORY_CARE_PROVIDER_SITE_OTHER): Payer: Medicare HMO | Admitting: Family Medicine

## 2018-03-16 ENCOUNTER — Encounter: Payer: Self-pay | Admitting: Family Medicine

## 2018-03-16 ENCOUNTER — Other Ambulatory Visit: Payer: Self-pay

## 2018-03-16 VITALS — BP 134/70 | HR 72 | Temp 97.4°F | Resp 16 | Ht 65.0 in | Wt 167.1 lb

## 2018-03-16 DIAGNOSIS — K219 Gastro-esophageal reflux disease without esophagitis: Secondary | ICD-10-CM

## 2018-03-16 DIAGNOSIS — I10 Essential (primary) hypertension: Secondary | ICD-10-CM

## 2018-03-16 DIAGNOSIS — J439 Emphysema, unspecified: Secondary | ICD-10-CM | POA: Diagnosis not present

## 2018-03-16 DIAGNOSIS — I481 Persistent atrial fibrillation: Secondary | ICD-10-CM

## 2018-03-16 DIAGNOSIS — I4819 Other persistent atrial fibrillation: Secondary | ICD-10-CM

## 2018-03-16 NOTE — Patient Instructions (Signed)
Need follow up in six months No change in medicines or therapy See Dr Tracie Harrier next visit

## 2018-03-16 NOTE — Progress Notes (Signed)
Chief Complaint  Patient presents with  . essential hypertension    follow up  . Over Active Bladder   Patient is here for routine follow-up. At this visit she is feeling well. No complaint with her lungs or breathing.  She is compliant with her medications.  She is been doing a lot of yard work outside.  She is not having significant allergy symptoms. Patient states her appetite is good.  Diet is well-balanced.  No trouble swallowing, no GERD while takes her medications. She is physically active.  She states her energy level is much better since going off the amiodarone. I noticed that she has a suntan.  I cautioned her to use sunscreens.  She advises me that she never does. She has a history of hypertension.  Blood pressure today is well controlled.  No chest pain or irregular heartbeat.  She does have atrial fibrillation.  She is not aware of any rapid heartbeats. We discussed her social situation, and that her husband is difficult.  She states that she feels better in the spring and summer when she can get away from him by doing yard work  Patient Active Problem List   Diagnosis Date Noted  . Other social stressor 12/17/2017  . Pulmonary emphysema (HCC) 09/06/2017  . Restrictive lung disease 07/02/2017  . Other secondary pulmonary hypertension (HCC) 07/02/2017  . Persistent atrial fibrillation (HCC)   . Esophageal dysphagia 02/03/2017  . Dizziness 01/07/2017  . Monitoring for long-term anticoagulant use 12/18/2016  . CKD (chronic kidney disease), stage III (HCC) 12/12/2016  . Seizure disorder (HCC) 12/12/2016  . Presbyesophagus 12/11/2016  . History of esophageal stricture 12/10/2016  . Atrial fibrillation with RVR (HCC) 12/01/2016  . Pulmonary emboli (HCC) 12/01/2016  . GERD (gastroesophageal reflux disease) 12/01/2016  . Arthralgia of both knees 01/11/2016  . Hypertension 09/22/2011    Outpatient Encounter Medications as of 03/16/2018  Medication Sig  . atorvastatin  (LIPITOR) 10 MG tablet TAKE ONE TABLET BY MOUTH EVERY EVENING.  . carbamazepine (TEGRETOL XR) 100 MG 12 hr tablet TAKE ONE TABLET BY MOUTH TWICE DAILY (MORNING ,BEDTIME)  . diltiazem (TIAZAC) 240 MG 24 hr capsule TAKE ONE CAPSULE BY MOUTH DAILY. (MORNING)  . oxybutynin (DITROPAN) 5 MG tablet One or two pills at bedtime  . pantoprazole (PROTONIX) 40 MG tablet TAKE ONE TABLET BY MOUTH DAILY (MORNING)  . potassium chloride (K-DUR,KLOR-CON) 10 MEQ tablet TAKE ONE TABLET BY MOUTH TWICE DAILY. (MORNING ,BEDTIME)  . Vitamin D, Ergocalciferol, (DRISDOL) 50000 units CAPS capsule TAKE ONE CAPSULE BY MOUTH EVERY 7 DAYS (FRIDAY MORNING)  . warfarin (COUMADIN) 2.5 MG tablet TAKE AS DIRECTED BY THE ANTICOAGULATION CLINIC   No facility-administered encounter medications on file as of 03/16/2018.     Allergies  Allergen Reactions  . Codeine Nausea Only    Review of Systems  Constitutional: Negative for activity change, appetite change, fatigue and unexpected weight change.  HENT: Negative for congestion, dental problem, postnasal drip, rhinorrhea and trouble swallowing.   Eyes: Negative for photophobia, redness and visual disturbance.  Respiratory: Negative for cough and shortness of breath.   Cardiovascular: Negative for chest pain, palpitations and leg swelling.  Gastrointestinal: Negative for abdominal distention, abdominal pain, constipation and diarrhea.  Genitourinary: Negative for difficulty urinating, frequency and vaginal bleeding.  Musculoskeletal: Positive for back pain. Negative for arthralgias.       Intermittent  Skin: Positive for wound.       Small wound left leg , healing  Neurological:  Negative for dizziness and headaches.  Psychiatric/Behavioral: Negative for dysphoric mood and sleep disturbance. The patient is not nervous/anxious.     Physical Exam  Constitutional: She is oriented to person, place, and time. She appears well-developed and well-nourished.  HENT:  Head:  Normocephalic and atraumatic.  Mouth/Throat: Oropharynx is clear and moist.  Eyes: Pupils are equal, round, and reactive to light. Conjunctivae are normal.  Neck: Normal range of motion. Neck supple. No thyromegaly present.  Cardiovascular: Normal rate, regular rhythm and normal heart sounds.  Heart reg with occas ectopy  Pulmonary/Chest: Effort normal and breath sounds normal. No respiratory distress. She has no rales.  clear  Abdominal: Soft. Bowel sounds are normal.  Musculoskeletal: Normal range of motion. She exhibits no edema.  Lymphadenopathy:    She has no cervical adenopathy.  Neurological: She is alert and oriented to person, place, and time.  Skin: Skin is warm and dry.  Skin dry, ankles hyperpigmented.  Superficial eschar left medial ankle, 2 x 3 cm.  No redness or signs of infection  Psychiatric: Her speech is normal and behavior is normal. Judgment and thought content normal. Her mood appears anxious. Her affect is angry. Cognition and memory are normal.  Nursing note and vitals reviewed.   BP 134/70   Pulse 72   Temp (!) 97.4 F (36.3 C) (Oral)   Resp 16   Ht 5\' 5"  (1.651 m)   Wt 167 lb 1.9 oz (75.8 kg)   SpO2 95%   BMI 27.81 kg/m    ASSESSMENT/PLAN:  1. Essential hypertension Well-controlled  2. Persistent atrial fibrillation (HCC) Under care of cardiology.  On Coumadin.  Compliant with monthly visits  3. Gastroesophageal reflux disease, esophagitis presence not specified Taking PPI.  Under care of GI.  No symptoms  4. Pulmonary emphysema History of pulmonary emphysema, pulmonary emboli, no need for ongoing inhalers or complaint of shortness of breath.  Patient Instructions  Need follow up in six months No change in medicines or therapy See Dr Tracie Harrier next visit   Eustace Moore, MD

## 2018-03-17 ENCOUNTER — Ambulatory Visit: Payer: Medicare Other | Admitting: Family Medicine

## 2018-03-17 DIAGNOSIS — H401111 Primary open-angle glaucoma, right eye, mild stage: Secondary | ICD-10-CM | POA: Diagnosis not present

## 2018-03-17 DIAGNOSIS — H2512 Age-related nuclear cataract, left eye: Secondary | ICD-10-CM | POA: Diagnosis not present

## 2018-03-17 DIAGNOSIS — H2511 Age-related nuclear cataract, right eye: Secondary | ICD-10-CM | POA: Diagnosis not present

## 2018-03-29 ENCOUNTER — Ambulatory Visit (INDEPENDENT_AMBULATORY_CARE_PROVIDER_SITE_OTHER): Payer: Medicare HMO | Admitting: *Deleted

## 2018-03-29 DIAGNOSIS — Z5181 Encounter for therapeutic drug level monitoring: Secondary | ICD-10-CM

## 2018-03-29 DIAGNOSIS — Z7901 Long term (current) use of anticoagulants: Secondary | ICD-10-CM | POA: Diagnosis not present

## 2018-03-29 DIAGNOSIS — I4891 Unspecified atrial fibrillation: Secondary | ICD-10-CM

## 2018-03-29 LAB — POCT INR: INR: 2.8

## 2018-03-29 NOTE — Patient Instructions (Signed)
Continue coumadin 1 tablet daily except 1 1/2 tablets on Tuesdays Do 2 - 3 serving of greens a week  Amiodarone 200mg was stopped on 01/13/18 Recheck in 4 weeks 

## 2018-04-01 DIAGNOSIS — H2512 Age-related nuclear cataract, left eye: Secondary | ICD-10-CM | POA: Diagnosis not present

## 2018-04-01 DIAGNOSIS — H34812 Central retinal vein occlusion, left eye, with macular edema: Secondary | ICD-10-CM | POA: Diagnosis not present

## 2018-04-01 DIAGNOSIS — H35361 Drusen (degenerative) of macula, right eye: Secondary | ICD-10-CM | POA: Diagnosis not present

## 2018-04-05 ENCOUNTER — Telehealth: Payer: Self-pay | Admitting: Family Medicine

## 2018-04-05 ENCOUNTER — Other Ambulatory Visit: Payer: Self-pay | Admitting: Cardiovascular Disease

## 2018-04-05 NOTE — Telephone Encounter (Signed)
Myra with [facility] is calling-regarding RX [phone]

## 2018-04-06 ENCOUNTER — Other Ambulatory Visit: Payer: Self-pay

## 2018-04-06 ENCOUNTER — Telehealth: Payer: Self-pay | Admitting: Family Medicine

## 2018-04-06 MED ORDER — CARBAMAZEPINE ER 100 MG PO TB12: ORAL_TABLET | ORAL | 0 refills | Status: DC

## 2018-04-06 NOTE — Telephone Encounter (Signed)
You may refill this for one month and she needs to schedule OV. Janine Limbo. Tracie Harrier, MD

## 2018-04-06 NOTE — Telephone Encounter (Signed)
Patient needs refill of carbamazepine  She is anxious about running out & pharmacy is telling her we will not reply.  [facility] is her pharmacy Cb#: [phone]/ [phone]   They filled her medication for this week only.  Please call patient to discuss/ and let her know when this is done.

## 2018-04-06 NOTE — Telephone Encounter (Signed)
Spoke with patient and let her know 1 month of medication had been called into pharmacy however she needed to be seen sooner than scheduled appt. She verbalized understanding. Will send message to front staff to call patient tomorrow to schedule sooner appt.

## 2018-04-08 NOTE — Telephone Encounter (Signed)
Karen Richardson calling regarding prescription refill. Refill has already been sent in and patient notified.

## 2018-04-14 ENCOUNTER — Emergency Department (HOSPITAL_COMMUNITY)
Admission: EM | Admit: 2018-04-14 | Discharge: 2018-04-14 | Disposition: A | Discharge status: Home / Self Care | Payer: Medicare HMO | Attending: Emergency Medicine | Admitting: Emergency Medicine

## 2018-04-14 ENCOUNTER — Other Ambulatory Visit: Payer: Self-pay

## 2018-04-14 ENCOUNTER — Encounter (HOSPITAL_COMMUNITY): Payer: Self-pay

## 2018-04-14 ENCOUNTER — Ambulatory Visit: Payer: Medicare HMO | Admitting: Cardiovascular Disease

## 2018-04-14 ENCOUNTER — Emergency Department (HOSPITAL_COMMUNITY): Payer: Medicare HMO

## 2018-04-14 DIAGNOSIS — R5383 Other fatigue: Secondary | ICD-10-CM | POA: Diagnosis present

## 2018-04-14 DIAGNOSIS — Z7901 Long term (current) use of anticoagulants: Secondary | ICD-10-CM | POA: Diagnosis not present

## 2018-04-14 DIAGNOSIS — Z7722 Contact with and (suspected) exposure to environmental tobacco smoke (acute) (chronic): Secondary | ICD-10-CM | POA: Insufficient documentation

## 2018-04-14 DIAGNOSIS — Z79899 Other long term (current) drug therapy: Secondary | ICD-10-CM | POA: Diagnosis not present

## 2018-04-14 DIAGNOSIS — I4891 Unspecified atrial fibrillation: Secondary | ICD-10-CM | POA: Insufficient documentation

## 2018-04-14 DIAGNOSIS — N183 Chronic kidney disease, stage 3 (moderate): Secondary | ICD-10-CM | POA: Diagnosis not present

## 2018-04-14 DIAGNOSIS — I129 Hypertensive chronic kidney disease with stage 1 through stage 4 chronic kidney disease, or unspecified chronic kidney disease: Secondary | ICD-10-CM | POA: Diagnosis not present

## 2018-04-14 DIAGNOSIS — R000 Tachycardia, unspecified: Secondary | ICD-10-CM | POA: Diagnosis not present

## 2018-04-14 LAB — URINALYSIS, ROUTINE W REFLEX MICROSCOPIC
Bacteria, UA: NONE SEEN
Bilirubin Urine: NEGATIVE
Glucose, UA: NEGATIVE mg/dL
Hgb urine dipstick: NEGATIVE
KETONES UR: NEGATIVE mg/dL
Nitrite: NEGATIVE
PROTEIN: NEGATIVE mg/dL
Specific Gravity, Urine: 1.02 (ref 1.005–1.030)
pH: 5 (ref 5.0–8.0)

## 2018-04-14 LAB — CBC
HCT: 37.8 % (ref 36.0–46.0)
HEMOGLOBIN: 12 g/dL (ref 12.0–15.0)
MCH: 30.8 pg (ref 26.0–34.0)
MCHC: 31.7 g/dL (ref 30.0–36.0)
MCV: 97.2 fL (ref 78.0–100.0)
PLATELETS: 186 10*3/uL (ref 150–400)
RBC: 3.89 MIL/uL (ref 3.87–5.11)
RDW: 14 % (ref 11.5–15.5)
WBC: 4.7 10*3/uL (ref 4.0–10.5)

## 2018-04-14 LAB — MAGNESIUM: Magnesium: 2 mg/dL (ref 1.7–2.4)

## 2018-04-14 LAB — BASIC METABOLIC PANEL
Anion gap: 7 (ref 5–15)
BUN: 19 mg/dL (ref 6–20)
CALCIUM: 8.7 mg/dL — AB (ref 8.9–10.3)
CHLORIDE: 107 mmol/L (ref 101–111)
CO2: 27 mmol/L (ref 22–32)
CREATININE: 1.25 mg/dL — AB (ref 0.44–1.00)
GFR calc Af Amer: 45 mL/min — ABNORMAL LOW (ref >60)
GFR calc non Af Amer: 39 mL/min — ABNORMAL LOW (ref >60)
GLUCOSE: 115 mg/dL — AB (ref 65–99)
Potassium: 4 mmol/L (ref 3.5–5.1)
Sodium: 141 mmol/L (ref 135–145)

## 2018-04-14 LAB — TROPONIN I: Troponin I: <0.03 ng/mL (ref <0.03)

## 2018-04-14 MED ORDER — DILTIAZEM HCL ER BEADS 240 MG PO CP24: ORAL_CAPSULE | ORAL | 0 refills | Status: DC

## 2018-04-14 MED ORDER — DILTIAZEM HCL-DEXTROSE 100-5 MG/100ML-% IV SOLN (PREMIX)
5.0000 mg/h | Freq: Once | INTRAVENOUS | Status: AC
  Administered 2018-04-14: 5 mg/h via INTRAVENOUS
  Filled 2018-04-14: qty 100

## 2018-04-14 MED ORDER — DILTIAZEM HCL ER BEADS 240 MG PO CP24
240.0000 mg | ORAL_CAPSULE | Freq: Once | ORAL | Status: AC
  Administered 2018-04-14: 240 mg via ORAL
  Filled 2018-04-14: qty 1

## 2018-04-14 NOTE — Discharge Instructions (Addendum)
Take Cardizem daily.  Make an appointment and follow-up with your cardiologist.

## 2018-04-14 NOTE — Progress Notes (Signed)
Patient discussed with Dr. Ranae Palms via phone.  She was sent from the surgical day center where she was supposed to have cataract surgery, anesthesiology found her to have a heart rate in the 130s.  She is known to have atrial fibrillation.  She was started on a Cardizem drip her heart rate has been in the 70s to 90s for the past 2-1/2 hours.  She is on Cardizem 240 mg daily.  She does not have signs of pulmonary edema on chest x-ray.  Does not feel short of breath or have any oxygen requirement.  2D echo in March 2018 with preserved ejection fraction.  Discussed with Dr. Ranae Palms to start her Cardizem 240 mg daily and wean off Cardizem drip with goal to send her home.  If any issues he knows to call us back.  Peggye Pitt, MD [facility] Pager: [phone]

## 2018-04-14 NOTE — ED Triage Notes (Signed)
Pt reports was at the surgical center in [place] today to have cataract surgery and was told she couldn't because her hr was irregular and fast.  Pt has history of afib and is on coumadin.  Denies any cp but reports fatigue recently.

## 2018-04-14 NOTE — ED Provider Notes (Addendum)
[facility] EMERGENCY DEPARTMENT Provider Note   CSN: [id number] Arrival date & time: 04/14/18  4098     History   Chief Complaint Chief Complaint  Patient presents with  . Tachycardia    HPI Karen Richardson is a 84 y.o. female.  HPI Patient was scheduled to have cataract surgery on her left eye when she was discovered to be in atrial fibrillation with RVR.  Referred to the emergency department.  Patient has a history of atrial fibrillation.  States several nights ago she did have some palpitations but denies any currently.  States she has been fatigued but denies any pain.  No fever or chills.  States she is been compliant with her medication. Past Medical History:  Diagnosis Date  . Anxiety   . Atrial fibrillation with RVR (HCC) 12/01/2016  . CKD (chronic kidney disease), stage III (HCC) 12/12/2016  . Depression   . Dysphagia   . GERD (gastroesophageal reflux disease) 12/01/2016  . Heart murmur   . History of esophageal stricture 12/10/2016  . Hyperlipidemia   . Hypertension    x 4 months  . Presbyesophagus 12/11/2016  . Pulmonary emboli (HCC) 12/01/2016  . Pulmonary embolism (HCC)   . Pulmonary emphysema (HCC) 09/06/2017  . Pulmonary nodule   . Seizures (HCC)   . Stroke [facility])     Patient Active Problem List   Diagnosis Date Noted  . Other social stressor 12/17/2017  . Pulmonary emphysema (HCC) 09/06/2017  . Restrictive lung disease 07/02/2017  . Other secondary pulmonary hypertension (HCC) 07/02/2017  . Persistent atrial fibrillation (HCC)   . Esophageal dysphagia 02/03/2017  . Dizziness 01/07/2017  . Monitoring for long-term anticoagulant use 12/18/2016  . CKD (chronic kidney disease), stage III (HCC) 12/12/2016  . Seizure disorder (HCC) 12/12/2016  . Presbyesophagus 12/11/2016  . History of esophageal stricture 12/10/2016  . Atrial fibrillation with RVR (HCC) 12/01/2016  . Pulmonary emboli (HCC) 12/01/2016  . GERD (gastroesophageal reflux disease) 12/01/2016  .  Arthralgia of both knees 01/11/2016  . Hypertension 09/22/2011    Past Surgical History:  Procedure Laterality Date  . ABDOMINAL HYSTERECTOMY    . BREAST ENHANCEMENT SURGERY     40 yrs ago  . CARDIOVERSION N/A 05/03/2017   Procedure: CARDIOVERSION;  Surgeon: Elease Hashimoto Deloris Ping, MD;  Location: [facility] ENDOSCOPY;  Service: Cardiovascular;  Laterality: N/A;  . ESOPHAGEAL DILATION N/A 03/17/2017   Procedure: ESOPHAGEAL DILATION;  Surgeon: Malissa Hippo, MD;  Location: [facility] ENDO SUITE;  Service: Endoscopy;  Laterality: N/A;  . ESOPHAGOGASTRODUODENOSCOPY N/A 03/17/2017   Procedure: ESOPHAGOGASTRODUODENOSCOPY (EGD);  Surgeon: Malissa Hippo, MD;  Location: [facility] ENDO SUITE;  Service: Endoscopy;  Laterality: N/A;  10:30  . PARTIAL HYSTERECTOMY    . SPINE SURGERY     Tumor removal  . Tumor removed from spinal cord     benign     OB History    Gravida  4   Para  4   Term  4   Preterm      AB      Living  2     SAB      TAB      Ectopic      Multiple      Live Births               Home Medications    Prior to Admission medications   Medication Sig Start Date End Date Taking? Authorizing Provider  atorvastatin (LIPITOR) 10 MG tablet TAKE ONE TABLET  BY MOUTH EVERY EVENING. 01/11/18  Yes Laqueta Linden, MD  carbamazepine (TEGRETOL XR) 100 MG 12 hr tablet TAKE ONE TABLET BY MOUTH TWICE DAILY (MORNING ,BEDTIME) 04/06/18  Yes Hagler, Fleet Contras, MD  DUREZOL 0.05 % EMUL Place 1 drop into the left eye 3 (three) times daily. 03/21/18  Yes [provider]  ketorolac (ACULAR) 0.5 % ophthalmic solution Place 1 drop into the left eye 4 (four) times daily. 03/21/18  Yes [provider]  moxifloxacin (VIGAMOX) 0.5 % ophthalmic solution Place 1 drop into the left eye 4 (four) times daily. 03/21/18  Yes [provider]  oxybutynin (DITROPAN) 5 MG tablet One or two pills at bedtime 11/02/17  Yes Eustace Moore, MD  potassium chloride (K-DUR,KLOR-CON) 10 MEQ tablet TAKE  ONE TABLET BY MOUTH TWICE DAILY. (MORNING ,BEDTIME) 04/05/18  Yes Laqueta Linden, MD  RHOPRESSA 0.02 % SOLN Place 1 drop into the left eye at bedtime. 03/21/18  Yes [provider]  vitamin B-12 (CYANOCOBALAMIN) 500 MCG tablet Take 500 mcg by mouth daily.   Yes [provider]  Vitamin D, Ergocalciferol, (DRISDOL) 50000 units CAPS capsule TAKE ONE CAPSULE BY MOUTH EVERY 7 DAYS (FRIDAY MORNING) 08/18/17  Yes Eustace Moore, MD  warfarin (COUMADIN) 2.5 MG tablet TAKE AS DIRECTED BY THE ANTICOAGULATION CLINIC 02/15/18  Yes Laqueta Linden, MD  diltiazem (TIAZAC) 240 MG 24 hr capsule TAKE ONE CAPSULE BY MOUTH DAILY. (MORNING) 03/04/18   Laqueta Linden, MD  pantoprazole (PROTONIX) 40 MG tablet TAKE ONE TABLET BY MOUTH DAILY (MORNING) Patient not taking: Reported on 04/14/2018 01/11/18   Eustace Moore, MD    Family History Family History  Problem Relation Age of Onset  . Pulmonary embolism Mother   . Cancer Mother        mass in colon  . Stroke Father   . Alcohol abuse Father   . Diabetes Sister   . Heart failure Sister   . Brain cancer Sister   . Schizophrenia Brother   . Lung disease Neg Hx     Social History Social History   Tobacco Use  . Smoking status: Passive Smoke Exposure - Never Smoker  . Smokeless tobacco: Never Used  . Tobacco comment: Father as a child   Substance Use Topics  . Alcohol use: No  . Drug use: No     Allergies   Codeine   Review of Systems Review of Systems  Constitutional: Positive for fatigue. Negative for chills and fever.  HENT: Negative for trouble swallowing.   Eyes: Negative for visual disturbance.  Respiratory: Negative for cough, shortness of breath and wheezing.   Cardiovascular: Positive for palpitations. Negative for chest pain and leg swelling.  Gastrointestinal: Negative for abdominal pain, diarrhea, nausea and vomiting.  Genitourinary: Negative for dysuria, flank pain, frequency and hematuria.    Musculoskeletal: Negative for back pain, myalgias, neck pain and neck stiffness.  Skin: Negative for rash and wound.  Neurological: Negative for dizziness, weakness, light-headedness, numbness and headaches.  All other systems reviewed and are negative.    Physical Exam Updated Vital Signs BP (!) 111/51   Pulse 81   Temp 97.7 F (36.5 C) (Oral)   Resp 18   Ht 5\' 5"  (1.651 m)   Wt 73.9 kg (163 lb)   SpO2 96%   BMI 27.12 kg/m   Physical Exam  Constitutional: She is oriented to person, place, and time. She appears well-developed and well-nourished. No distress.  HENT:  Head:  Normocephalic and atraumatic.  Mouth/Throat: Oropharynx is clear and moist. No oropharyngeal exudate.  Eyes: EOM are normal.  Neck: Normal range of motion. Neck supple. No JVD present.  Cardiovascular:  Tachycardia.  Irregularly irregular.  Pulmonary/Chest: Effort normal and breath sounds normal. No stridor. No respiratory distress. She has no wheezes. She has no rales. She exhibits no tenderness.  Abdominal: Soft. Bowel sounds are normal. There is no tenderness. There is no rebound and no guarding.  Musculoskeletal: Normal range of motion. She exhibits no edema or tenderness.  No lower extremity swelling, asymmetry or to tenderness.  Distal pulses intact.  Lymphadenopathy:    She has no cervical adenopathy.  Neurological: She is alert and oriented to person, place, and time.  Moves all extremities without focal deficit.  Sensation fully intact.  Skin: Skin is warm and dry. Capillary refill takes less than 2 seconds. No rash noted. She is not diaphoretic. No erythema.  Psychiatric: She has a normal mood and affect. Her behavior is normal.  Nursing note and vitals reviewed.    ED Treatments / Results  Labs (all labs ordered are listed, but only abnormal results are displayed) Labs Reviewed  BASIC METABOLIC PANEL - Abnormal; Notable for the following components:      Result Value   Glucose, Bld 115  (*)    Creatinine, Ser 1.25 (*)    Calcium 8.7 (*)    GFR calc non Af Amer 39 (*)    GFR calc Af Amer 45 (*)    All other components within normal limits  URINALYSIS, ROUTINE W REFLEX MICROSCOPIC - Abnormal; Notable for the following components:   Leukocytes, UA TRACE (*)    All other components within normal limits  TROPONIN I  CBC  MAGNESIUM    EKG EKG Interpretation  Date/Time:  Thursday Apr 14 2018 09:51:03 EDT Ventricular Rate:  123 PR Interval:    QRS Duration: 90 QT Interval:  343 QTC Calculation: 491 R Axis:   67 Text Interpretation:  Atrial fibrillation Borderline repolarization abnormality Borderline prolonged QT interval Confirmed by Loren Racer ([id number]) on 04/14/2018 11:30:43 AM   Radiology Dg Chest 2 View  Result Date: 04/14/2018 CLINICAL DATA:  Tachycardia, fatigue EXAM: CHEST - 2 VIEW COMPARISON:  09/03/2017 FINDINGS: Cardiac enlargement without heart failure. Lungs clear without infiltrate effusion or mass. Mild compressions fracture approximately T6 and T12 not present on prior CT head 07/07/2017 IMPRESSION: Cardiac enlargement without acute cardiopulmonary abnormality. Electronically Signed   By: Marlan Palau M.D.   On: 04/14/2018 11:26    Procedures Procedures (including critical care time)  Medications Ordered in ED Medications  diltiazem (CARDIZEM) 100 mg in dextrose 5% (1 mg/mL) infusion (0 mg/hr Intravenous Stopped 04/14/18 1443)  diltiazem (TIAZAC) 24 hr capsule 240 mg (240 mg Oral Given 04/14/18 1346)   CRITICAL CARE Performed by: Loren Racer Total critical care time: 30 minutes Critical care time was exclusive of separately billable procedures and treating other patients. Critical care was necessary to treat or prevent imminent or life-threatening deterioration. Critical care was time spent personally by me on the following activities: development of treatment plan with patient and/or surrogate as well as nursing, discussions with  consultants, evaluation of patient's response to treatment, examination of patient, obtaining history from patient or surrogate, ordering and performing treatments and interventions, ordering and review of laboratory studies, ordering and review of radiographic studies, pulse oximetry and re-evaluation of patient's condition.   Initial Impression / Assessment and Plan / ED Course  I have reviewed the triage vital signs and the nursing notes.  Pertinent labs & imaging results that were available during my care of the patient were reviewed by me and considered in my medical decision making (see chart for details).     Heart rate controlled on Cardizem drip.  Will discuss with hospitalist regarding admission.  Discussed with hospitalist, Dr. Ardyth Harps.  Recommend weaning patient off diltiazem gym drip and giving oral diltiazem.  She will evaluate in the emergency department.  Patient has been weaned off Cardizem drip.  Given dose of p.o. Cardizem.  Maintaining heart rate in the 70s and 80s.  No shortness of breath or chest pain.  Advised to follow-up closely with her cardiologist.  Return precautions given.  Final Clinical Impressions(s) / ED Diagnoses   Final diagnoses:  Atrial fibrillation with RVR [facility])    ED Discharge Orders    None       Loren Racer, MD 04/14/18 1526    Loren Racer, MD 05/27/18 [phone]

## 2018-04-15 ENCOUNTER — Telehealth: Payer: Self-pay | Admitting: Family Medicine

## 2018-04-15 ENCOUNTER — Telehealth: Payer: Self-pay | Admitting: Cardiovascular Disease

## 2018-04-15 NOTE — Telephone Encounter (Signed)
Patient discharged from [facility] yesterday.   Has questions about her medication and also stated that she was told to see Dr Purvis Sheffield today or first of next week.   I scheduled her with B. Strader Apr 29, 2018 in the [place] office, which was first available in either office.

## 2018-04-15 NOTE — Telephone Encounter (Signed)
Returned call to patient - stated she figured out the medication - was given script for medication that she was already taking (Diltiazem CD 240mg  daily).    She does question when she should see provider back for follow up.  ED note does not state a specific time - only states follow closely with cardiology.  OV was scheduled with Tonna Corner, PA for 04/29/2018 in [place].  Please review ED note to make sure this is okay.

## 2018-04-15 NOTE — Telephone Encounter (Signed)
Pt called and wanted her appt moved up.  I have put her on the work in list if we have a cancellation.

## 2018-04-15 NOTE — Telephone Encounter (Signed)
That is fine. Monitor HR and BP over weekend, call her back Monday to see how she's doing. FU appt on 5/31 is fine for now.

## 2018-04-16 ENCOUNTER — Encounter: Payer: Self-pay | Admitting: Student

## 2018-04-18 ENCOUNTER — Telehealth: Payer: Self-pay | Admitting: *Deleted

## 2018-04-18 NOTE — Telephone Encounter (Signed)
Please see telephone encounter for details r/e response to this message.

## 2018-04-18 NOTE — Telephone Encounter (Signed)
Spoke with daughter r/e mychart message sent with concerns of patient's recent ED visit and given rx for diltiazem in which she was currently taking. Advised daughter that patient responded well to ED tx with diltiazem and this was the tx of choice by her provider. Also daughter expressed concerns about patient being seen sooner than 04/29/18. Offered sooner appointment in the A-fib clinic but daughter requested that she speak with her mother first before making an appointment in the A-fib clinic. Daughter will contact office if patient decides to be seen in the a-fib clinic.

## 2018-04-18 NOTE — Telephone Encounter (Signed)
Left message to return call 

## 2018-04-20 ENCOUNTER — Other Ambulatory Visit: Payer: Self-pay | Admitting: Family Medicine

## 2018-04-22 NOTE — Telephone Encounter (Signed)
Left message to return call 

## 2018-04-26 ENCOUNTER — Other Ambulatory Visit: Payer: Self-pay | Admitting: Family Medicine

## 2018-04-26 ENCOUNTER — Ambulatory Visit (INDEPENDENT_AMBULATORY_CARE_PROVIDER_SITE_OTHER): Payer: Medicare HMO | Admitting: *Deleted

## 2018-04-26 ENCOUNTER — Ambulatory Visit: Payer: Medicare HMO | Admitting: Cardiovascular Disease

## 2018-04-26 DIAGNOSIS — I4891 Unspecified atrial fibrillation: Secondary | ICD-10-CM | POA: Diagnosis not present

## 2018-04-26 DIAGNOSIS — Z7901 Long term (current) use of anticoagulants: Secondary | ICD-10-CM | POA: Diagnosis not present

## 2018-04-26 DIAGNOSIS — Z5181 Encounter for therapeutic drug level monitoring: Secondary | ICD-10-CM | POA: Diagnosis not present

## 2018-04-26 LAB — POCT INR: INR: 1.6 — AB (ref 2.0–3.0)

## 2018-04-26 NOTE — Telephone Encounter (Signed)
Received call from daughter Barnabas Harries) this morning.  Stated that her mom had been having phone issues.  Now c/o ankles & legs swelling.  Heart racing.  Amiodarone was stopped in February.  Daughter was not sure if she had weight gain or not.  Was scheduled with Randall An on 04/29/2018, but looks like that was cancelled for some reason.  Daughter is questioning is she has the option to go back to the AFib clinic to be seen.

## 2018-04-26 NOTE — Patient Instructions (Signed)
Take coumadin 2 tablets tonight, 1 1/2 tablets tomorrow night then resume 1 tablet daily except 1 1/2 tablets on Tuesdays Do 2 - 3 serving of greens a week  Recheck in 2 weeks

## 2018-04-27 ENCOUNTER — Encounter: Payer: Self-pay | Admitting: *Deleted

## 2018-04-27 ENCOUNTER — Ambulatory Visit: Payer: Medicare HMO | Admitting: Cardiovascular Disease

## 2018-04-27 NOTE — Telephone Encounter (Signed)
If availability in a fib clinic today, would have her evaluated there.

## 2018-04-27 NOTE — Telephone Encounter (Signed)
Looks like she has actually been scheduled with you this evening at 4:20 for post hospital.

## 2018-04-27 NOTE — Telephone Encounter (Signed)
Patient notified & agrees to seeing the AFib clinic.  OV scheduled for Friday morning at 8:30 am.  Parking code for [place] is 1200.  OV this evening with Dr. Purvis Sheffield will be cancelled.  Patient verbalized understanding.

## 2018-04-29 ENCOUNTER — Ambulatory Visit (HOSPITAL_COMMUNITY)
Admission: RE | Admit: 2018-04-29 | Discharge: 2018-04-29 | Disposition: A | Discharge status: Home / Self Care | Payer: Medicare HMO | Source: Ambulatory Visit | Attending: Nurse Practitioner | Admitting: Nurse Practitioner

## 2018-04-29 ENCOUNTER — Telehealth: Payer: Self-pay | Admitting: Pharmacist

## 2018-04-29 ENCOUNTER — Encounter (HOSPITAL_COMMUNITY): Payer: Self-pay | Admitting: Nurse Practitioner

## 2018-04-29 ENCOUNTER — Telehealth (HOSPITAL_COMMUNITY): Payer: Self-pay | Admitting: *Deleted

## 2018-04-29 ENCOUNTER — Encounter

## 2018-04-29 ENCOUNTER — Ambulatory Visit (INDEPENDENT_AMBULATORY_CARE_PROVIDER_SITE_OTHER): Payer: Medicare HMO | Admitting: *Deleted

## 2018-04-29 ENCOUNTER — Ambulatory Visit: Payer: Medicare HMO | Admitting: Student

## 2018-04-29 VITALS — BP 148/84 | HR 130 | Ht 65.0 in | Wt 170.0 lb

## 2018-04-29 DIAGNOSIS — I129 Hypertensive chronic kidney disease with stage 1 through stage 4 chronic kidney disease, or unspecified chronic kidney disease: Secondary | ICD-10-CM | POA: Diagnosis not present

## 2018-04-29 DIAGNOSIS — Z8673 Personal history of transient ischemic attack (TIA), and cerebral infarction without residual deficits: Secondary | ICD-10-CM | POA: Diagnosis not present

## 2018-04-29 DIAGNOSIS — I4891 Unspecified atrial fibrillation: Secondary | ICD-10-CM

## 2018-04-29 DIAGNOSIS — Z7722 Contact with and (suspected) exposure to environmental tobacco smoke (acute) (chronic): Secondary | ICD-10-CM | POA: Diagnosis not present

## 2018-04-29 DIAGNOSIS — Z7901 Long term (current) use of anticoagulants: Secondary | ICD-10-CM | POA: Diagnosis not present

## 2018-04-29 DIAGNOSIS — Z79899 Other long term (current) drug therapy: Secondary | ICD-10-CM | POA: Diagnosis not present

## 2018-04-29 DIAGNOSIS — Z86711 Personal history of pulmonary embolism: Secondary | ICD-10-CM | POA: Diagnosis not present

## 2018-04-29 DIAGNOSIS — K219 Gastro-esophageal reflux disease without esophagitis: Secondary | ICD-10-CM | POA: Diagnosis not present

## 2018-04-29 DIAGNOSIS — J439 Emphysema, unspecified: Secondary | ICD-10-CM | POA: Diagnosis not present

## 2018-04-29 DIAGNOSIS — Z5181 Encounter for therapeutic drug level monitoring: Secondary | ICD-10-CM | POA: Diagnosis not present

## 2018-04-29 DIAGNOSIS — E785 Hyperlipidemia, unspecified: Secondary | ICD-10-CM | POA: Insufficient documentation

## 2018-04-29 DIAGNOSIS — Z823 Family history of stroke: Secondary | ICD-10-CM | POA: Diagnosis not present

## 2018-04-29 DIAGNOSIS — I481 Persistent atrial fibrillation: Secondary | ICD-10-CM | POA: Diagnosis not present

## 2018-04-29 DIAGNOSIS — F329 Major depressive disorder, single episode, unspecified: Secondary | ICD-10-CM | POA: Insufficient documentation

## 2018-04-29 DIAGNOSIS — N183 Chronic kidney disease, stage 3 (moderate): Secondary | ICD-10-CM | POA: Insufficient documentation

## 2018-04-29 DIAGNOSIS — F419 Anxiety disorder, unspecified: Secondary | ICD-10-CM | POA: Diagnosis not present

## 2018-04-29 LAB — BASIC METABOLIC PANEL
Anion gap: 6 (ref 5–15)
BUN: 20 mg/dL (ref 6–20)
CALCIUM: 8.9 mg/dL (ref 8.9–10.3)
CO2: 23 mmol/L (ref 22–32)
CREATININE: 1.42 mg/dL — AB (ref 0.44–1.00)
Chloride: 113 mmol/L — ABNORMAL HIGH (ref 101–111)
GFR calc non Af Amer: 33 mL/min — ABNORMAL LOW (ref >60)
GFR, EST AFRICAN AMERICAN: 39 mL/min — AB (ref >60)
Glucose, Bld: 90 mg/dL (ref 65–99)
Potassium: 4.5 mmol/L (ref 3.5–5.1)
Sodium: 142 mmol/L (ref 135–145)

## 2018-04-29 LAB — PROTIME-INR
INR: 1.88
PROTHROMBIN TIME: 21.5 s — AB (ref 11.4–15.2)

## 2018-04-29 LAB — MAGNESIUM: MAGNESIUM: 2 mg/dL (ref 1.7–2.4)

## 2018-04-29 MED ORDER — METOPROLOL TARTRATE 25 MG PO TABS: 25.0000 mg | ORAL_TABLET | Freq: Two times a day (BID) | ORAL | 3 refills | Status: DC

## 2018-04-29 MED ORDER — FUROSEMIDE 20 MG PO TABS: ORAL_TABLET | ORAL | 0 refills | Status: DC

## 2018-04-29 NOTE — Telephone Encounter (Signed)
Spoke with pt's daughter.  INR appt made for 05/04/18 at 10:30am.  Daughter verbalized understanding.  Pt in pending Tikosyn initiation.  Needs 4 weeks of therapeutic INR's.  INR today was 1.88.  Coumadin dose increase and instructions given to daughter as well.

## 2018-04-29 NOTE — Telephone Encounter (Signed)
Pt has cld and checked the price on Tikosyn and was told it would be $95 dollars for 30 days.  Pt stated that she was comfortable with this and that she is ready to proceed. She stated she just wants to feel better.  Pharmacy reviewed her med list and her recent INRs and gave their suggestions.  Forwarding to Ventura Sellers, Charity fundraiser for [employer].  Pt needs four weekly therapeutic INRs.  Pt not eligible to be scheduled for 1 month

## 2018-04-29 NOTE — Patient Instructions (Signed)
START Metoprolol 25 mg, take one tablet twice a day  START Lasix 20 mg, take one tablet one a day for 3 days, then discontinue  Follow up with EKG at Dr. Junius Argyle office Wednesday 05/04/2018, this order has been entered.  They should call you to schedule.

## 2018-04-29 NOTE — Telephone Encounter (Signed)
Medication list reviewed in anticipation of upcoming Tikosyn initiation. Patient is not taking any contraindicated or QTc prolonging medications.   Patient is anticoagulated on warfarin and will require 4 weekly therapeutic INR readings. Last INR on 5/28 was subtherapeutic at 1.6, earliest admit date for Tikosyn would be in a month. She will need INR scheduled the week of 6/2. Currently waiting for EKG to be scheduled on 6/5 at Dr Junius Argyle office. Will route to Coumadin clinic in [place] so that INR check can be arranged the same day once EKG is scheduled.  Patient will need to be counseled to avoid use of Benadryl while on Tikosyn and in the 2-3 days prior to Tikosyn initiation.

## 2018-04-29 NOTE — Patient Instructions (Signed)
Increase coumadin to 1 tablet daily except 1 1/2 tablets on Mondays, Wednesdays and Fridays Do 2 - 3 serving of greens a week  Recheck in 1 week
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
## 2018-05-03 LAB — AMIODARONE LEVEL
AMIODARONE LVL: 0.3 ug/mL — AB (ref 1.0–2.5)
N-Desethyl-Amiodarone: NOT DETECTED ug/mL (ref 1.0–2.5)

## 2018-05-04 ENCOUNTER — Other Ambulatory Visit: Payer: Self-pay

## 2018-05-04 ENCOUNTER — Encounter (INDEPENDENT_AMBULATORY_CARE_PROVIDER_SITE_OTHER): Payer: Self-pay

## 2018-05-04 ENCOUNTER — Encounter: Payer: Self-pay | Admitting: Family Medicine

## 2018-05-04 ENCOUNTER — Ambulatory Visit (INDEPENDENT_AMBULATORY_CARE_PROVIDER_SITE_OTHER): Payer: Medicare HMO | Admitting: *Deleted

## 2018-05-04 ENCOUNTER — Ambulatory Visit (INDEPENDENT_AMBULATORY_CARE_PROVIDER_SITE_OTHER): Payer: Medicare HMO

## 2018-05-04 ENCOUNTER — Telehealth: Payer: Self-pay | Admitting: Family Medicine

## 2018-05-04 ENCOUNTER — Ambulatory Visit (INDEPENDENT_AMBULATORY_CARE_PROVIDER_SITE_OTHER): Payer: Medicare HMO | Admitting: Family Medicine

## 2018-05-04 ENCOUNTER — Other Ambulatory Visit (HOSPITAL_COMMUNITY): Payer: Self-pay | Admitting: *Deleted

## 2018-05-04 ENCOUNTER — Telehealth: Payer: Self-pay | Admitting: Cardiovascular Disease

## 2018-05-04 VITALS — BP 126/80 | HR 71 | Temp 98.0°F | Resp 18 | Ht 65.5 in | Wt 169.0 lb

## 2018-05-04 VITALS — BP 130/74 | HR 67 | Ht 65.5 in

## 2018-05-04 DIAGNOSIS — I481 Persistent atrial fibrillation: Secondary | ICD-10-CM | POA: Diagnosis not present

## 2018-05-04 DIAGNOSIS — I4891 Unspecified atrial fibrillation: Secondary | ICD-10-CM

## 2018-05-04 DIAGNOSIS — G40909 Epilepsy, unspecified, not intractable, without status epilepticus: Secondary | ICD-10-CM | POA: Diagnosis not present

## 2018-05-04 DIAGNOSIS — Z5181 Encounter for therapeutic drug level monitoring: Secondary | ICD-10-CM | POA: Diagnosis not present

## 2018-05-04 DIAGNOSIS — I482 Chronic atrial fibrillation, unspecified: Secondary | ICD-10-CM

## 2018-05-04 DIAGNOSIS — R5383 Other fatigue: Secondary | ICD-10-CM | POA: Diagnosis not present

## 2018-05-04 DIAGNOSIS — R739 Hyperglycemia, unspecified: Secondary | ICD-10-CM | POA: Diagnosis not present

## 2018-05-04 DIAGNOSIS — Z79899 Other long term (current) drug therapy: Secondary | ICD-10-CM

## 2018-05-04 DIAGNOSIS — Z7901 Long term (current) use of anticoagulants: Secondary | ICD-10-CM | POA: Diagnosis not present

## 2018-05-04 DIAGNOSIS — I4819 Other persistent atrial fibrillation: Secondary | ICD-10-CM

## 2018-05-04 LAB — POCT INR: INR: 2.8 (ref 2.0–3.0)

## 2018-05-04 MED ORDER — CARBAMAZEPINE ER 100 MG PO TB12: ORAL_TABLET | ORAL | 3 refills | Status: DC
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
## 2018-05-04 NOTE — Telephone Encounter (Signed)
Pt's daughter Dot Been would like to speak with someone about how Mrs. Hyneman apt went.

## 2018-05-04 NOTE — Patient Instructions (Signed)
Continue coumadin 1 tablet daily except 1 1/2 tablets on Mondays, Wednesdays and Fridays Do 2 - 3 serving of greens a week  Recheck in 1 week

## 2018-05-04 NOTE — Telephone Encounter (Signed)
[name] (daughter) of the Pt is calling to see what is going on with her Mom, please call at [phone]
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
## 2018-05-04 NOTE — Progress Notes (Signed)
Pt came in today for an EKG. She states that she is still SOB and very tired. She also stated that her legs were still swelling a little. She complained that she was hearing some noises from things that were not there.(ex- water running when there was not) She is overall wore out form her atrial fibrillation. Will send note to a-fib clinic. Pt will await call if any changes need to be made.

## 2018-05-05 ENCOUNTER — Telehealth (HOSPITAL_COMMUNITY): Payer: Self-pay | Admitting: *Deleted

## 2018-05-05 ENCOUNTER — Encounter: Payer: Self-pay | Admitting: Family Medicine

## 2018-05-05 ENCOUNTER — Emergency Department (HOSPITAL_COMMUNITY): Payer: Medicare HMO

## 2018-05-05 ENCOUNTER — Telehealth: Payer: Self-pay | Admitting: Family Medicine

## 2018-05-05 ENCOUNTER — Encounter (HOSPITAL_COMMUNITY): Payer: Self-pay | Admitting: Emergency Medicine

## 2018-05-05 ENCOUNTER — Inpatient Hospital Stay (HOSPITAL_COMMUNITY)
Admission: EM | Admit: 2018-05-05 | Discharge: 2018-05-11 | DRG: 242 | Disposition: A | Discharge status: Home Health | Payer: Medicare HMO | Attending: Cardiovascular Disease | Admitting: Cardiovascular Disease

## 2018-05-05 ENCOUNTER — Other Ambulatory Visit: Payer: Self-pay

## 2018-05-05 ENCOUNTER — Encounter (HOSPITAL_COMMUNITY): Payer: Self-pay | Admitting: Nurse Practitioner

## 2018-05-05 DIAGNOSIS — I340 Nonrheumatic mitral (valve) insufficiency: Secondary | ICD-10-CM | POA: Diagnosis not present

## 2018-05-05 DIAGNOSIS — T462X5A Adverse effect of other antidysrhythmic drugs, initial encounter: Secondary | ICD-10-CM | POA: Diagnosis not present

## 2018-05-05 DIAGNOSIS — R0602 Shortness of breath: Secondary | ICD-10-CM | POA: Diagnosis not present

## 2018-05-05 DIAGNOSIS — R946 Abnormal results of thyroid function studies: Secondary | ICD-10-CM | POA: Diagnosis present

## 2018-05-05 DIAGNOSIS — R402413 Glasgow coma scale score 13-15, at hospital admission: Secondary | ICD-10-CM | POA: Diagnosis present

## 2018-05-05 DIAGNOSIS — I130 Hypertensive heart and chronic kidney disease with heart failure and stage 1 through stage 4 chronic kidney disease, or unspecified chronic kidney disease: Secondary | ICD-10-CM | POA: Diagnosis present

## 2018-05-05 DIAGNOSIS — I480 Paroxysmal atrial fibrillation: Secondary | ICD-10-CM | POA: Diagnosis not present

## 2018-05-05 DIAGNOSIS — Y92239 Unspecified place in hospital as the place of occurrence of the external cause: Secondary | ICD-10-CM | POA: Diagnosis not present

## 2018-05-05 DIAGNOSIS — I4581 Long QT syndrome: Secondary | ICD-10-CM | POA: Diagnosis not present

## 2018-05-05 DIAGNOSIS — E785 Hyperlipidemia, unspecified: Secondary | ICD-10-CM | POA: Diagnosis present

## 2018-05-05 DIAGNOSIS — Z7901 Long term (current) use of anticoagulants: Secondary | ICD-10-CM

## 2018-05-05 DIAGNOSIS — I351 Nonrheumatic aortic (valve) insufficiency: Secondary | ICD-10-CM | POA: Diagnosis not present

## 2018-05-05 DIAGNOSIS — Z8673 Personal history of transient ischemic attack (TIA), and cerebral infarction without residual deficits: Secondary | ICD-10-CM

## 2018-05-05 DIAGNOSIS — Z86711 Personal history of pulmonary embolism: Secondary | ICD-10-CM | POA: Diagnosis not present

## 2018-05-05 DIAGNOSIS — K228 Other specified diseases of esophagus: Secondary | ICD-10-CM | POA: Diagnosis present

## 2018-05-05 DIAGNOSIS — G40909 Epilepsy, unspecified, not intractable, without status epilepticus: Secondary | ICD-10-CM | POA: Diagnosis present

## 2018-05-05 DIAGNOSIS — Z959 Presence of cardiac and vascular implant and graft, unspecified: Secondary | ICD-10-CM

## 2018-05-05 DIAGNOSIS — R739 Hyperglycemia, unspecified: Secondary | ICD-10-CM | POA: Diagnosis present

## 2018-05-05 DIAGNOSIS — Z79899 Other long term (current) drug therapy: Secondary | ICD-10-CM

## 2018-05-05 DIAGNOSIS — I071 Rheumatic tricuspid insufficiency: Secondary | ICD-10-CM | POA: Diagnosis present

## 2018-05-05 DIAGNOSIS — R911 Solitary pulmonary nodule: Secondary | ICD-10-CM | POA: Diagnosis present

## 2018-05-05 DIAGNOSIS — N179 Acute kidney failure, unspecified: Secondary | ICD-10-CM | POA: Diagnosis not present

## 2018-05-05 DIAGNOSIS — N183 Chronic kidney disease, stage 3 (moderate): Secondary | ICD-10-CM | POA: Diagnosis present

## 2018-05-05 DIAGNOSIS — I481 Persistent atrial fibrillation: Principal | ICD-10-CM | POA: Diagnosis present

## 2018-05-05 DIAGNOSIS — I5033 Acute on chronic diastolic (congestive) heart failure: Secondary | ICD-10-CM | POA: Insufficient documentation

## 2018-05-05 DIAGNOSIS — I472 Ventricular tachycardia: Secondary | ICD-10-CM | POA: Diagnosis not present

## 2018-05-05 DIAGNOSIS — Z885 Allergy status to narcotic agent status: Secondary | ICD-10-CM

## 2018-05-05 DIAGNOSIS — Z7722 Contact with and (suspected) exposure to environmental tobacco smoke (acute) (chronic): Secondary | ICD-10-CM | POA: Diagnosis present

## 2018-05-05 DIAGNOSIS — G2581 Restless legs syndrome: Secondary | ICD-10-CM | POA: Diagnosis present

## 2018-05-05 DIAGNOSIS — I4891 Unspecified atrial fibrillation: Secondary | ICD-10-CM | POA: Diagnosis not present

## 2018-05-05 DIAGNOSIS — I4819 Other persistent atrial fibrillation: Secondary | ICD-10-CM

## 2018-05-05 DIAGNOSIS — J439 Emphysema, unspecified: Secondary | ICD-10-CM | POA: Diagnosis present

## 2018-05-05 DIAGNOSIS — M7989 Other specified soft tissue disorders: Secondary | ICD-10-CM | POA: Diagnosis not present

## 2018-05-05 DIAGNOSIS — J9811 Atelectasis: Secondary | ICD-10-CM | POA: Diagnosis not present

## 2018-05-05 HISTORY — DX: Acute on chronic diastolic (congestive) heart failure: I50.33

## 2018-05-05 LAB — HEMOGLOBIN A1C
HEMOGLOBIN A1C: 5.4 %{Hb} (ref <5.7)
Mean Plasma Glucose: 108 (calc)
eAG (mmol/L): 6 (calc)

## 2018-05-05 LAB — CBC
HEMATOCRIT: 38.4 % (ref 36.0–46.0)
HEMOGLOBIN: 12.1 g/dL (ref 12.0–15.0)
MCH: 30.7 pg (ref 26.0–34.0)
MCHC: 31.5 g/dL (ref 30.0–36.0)
MCV: 97.5 fL (ref 78.0–100.0)
Platelets: 199 10*3/uL (ref 150–400)
RBC: 3.94 MIL/uL (ref 3.87–5.11)
RDW: 14.6 % (ref 11.5–15.5)
WBC: 5.4 10*3/uL (ref 4.0–10.5)

## 2018-05-05 LAB — BASIC METABOLIC PANEL
Anion gap: 8 (ref 5–15)
BUN: 16 mg/dL (ref 6–20)
CO2: 23 mmol/L (ref 22–32)
Calcium: 8.8 mg/dL — ABNORMAL LOW (ref 8.9–10.3)
Chloride: 111 mmol/L (ref 101–111)
Creatinine, Ser: 1.31 mg/dL — ABNORMAL HIGH (ref 0.44–1.00)
GFR, EST AFRICAN AMERICAN: 43 mL/min — AB (ref >60)
GFR, EST NON AFRICAN AMERICAN: 37 mL/min — AB (ref >60)
GLUCOSE: 112 mg/dL — AB (ref 65–99)
POTASSIUM: 4.3 mmol/L (ref 3.5–5.1)
Sodium: 142 mmol/L (ref 135–145)

## 2018-05-05 LAB — COMPLETE METABOLIC PANEL WITH GFR
AG RATIO: 1.7 (calc) (ref 1.0–2.5)
ALKALINE PHOSPHATASE (APISO): 111 U/L (ref 33–130)
ALT: 74 U/L — AB (ref 6–29)
AST: 57 U/L — ABNORMAL HIGH (ref 10–35)
Albumin: 4 g/dL (ref 3.6–5.1)
BILIRUBIN TOTAL: 0.5 mg/dL (ref 0.2–1.2)
BUN / CREAT RATIO: 17 (calc) (ref 6–22)
BUN: 22 mg/dL (ref 7–25)
CO2: 24 mmol/L (ref 20–32)
Calcium: 9.2 mg/dL (ref 8.6–10.4)
Chloride: 109 mmol/L (ref 98–110)
Creat: 1.32 mg/dL — ABNORMAL HIGH (ref 0.60–0.88)
GFR, EST AFRICAN AMERICAN: 43 mL/min/{1.73_m2} — AB (ref >=60)
GFR, Est Non African American: 37 mL/min/{1.73_m2} — ABNORMAL LOW (ref >=60)
GLUCOSE: 92 mg/dL (ref 65–139)
Globulin: 2.4 g/dL (calc) (ref 1.9–3.7)
POTASSIUM: 4.9 mmol/L (ref 3.5–5.3)
Sodium: 142 mmol/L (ref 135–146)
Total Protein: 6.4 g/dL (ref 6.1–8.1)

## 2018-05-05 LAB — T4, FREE: Free T4: 0.95 ng/dL (ref 0.82–1.77)

## 2018-05-05 LAB — TSH: TSH: 5.4 m[IU]/L — AB (ref 0.40–4.50)

## 2018-05-05 MED ORDER — ONDANSETRON HCL 4 MG/2ML IJ SOLN: 4.0000 mg | Freq: Four times a day (QID) | INTRAMUSCULAR | Status: DC | PRN

## 2018-05-05 MED ORDER — POTASSIUM CHLORIDE CRYS ER 10 MEQ PO TBCR
10.0000 meq | EXTENDED_RELEASE_TABLET | Freq: Every day | ORAL | Status: DC
  Administered 2018-05-06 – 2018-05-11 (×6): 10 meq via ORAL
  Filled 2018-05-05 (×6): qty 1

## 2018-05-05 MED ORDER — SODIUM CHLORIDE 0.9% FLUSH: 3.0000 mL | INTRAVENOUS | Status: DC | PRN

## 2018-05-05 MED ORDER — CARBAMAZEPINE ER 100 MG PO TB12
100.0000 mg | ORAL_TABLET | Freq: Two times a day (BID) | ORAL | Status: DC
  Administered 2018-05-05 – 2018-05-11 (×12): 100 mg via ORAL
  Filled 2018-05-05 (×12): qty 1

## 2018-05-05 MED ORDER — ATORVASTATIN CALCIUM 10 MG PO TABS
10.0000 mg | ORAL_TABLET | Freq: Every evening | ORAL | Status: DC
  Administered 2018-05-05 – 2018-05-10 (×6): 10 mg via ORAL
  Filled 2018-05-05 (×6): qty 1

## 2018-05-05 MED ORDER — SODIUM CHLORIDE 0.9 % IV SOLN
INTRAVENOUS | Status: DC
  Administered 2018-05-06: 06:00:00 via INTRAVENOUS

## 2018-05-05 MED ORDER — METOPROLOL TARTRATE 25 MG PO TABS
25.0000 mg | ORAL_TABLET | Freq: Two times a day (BID) | ORAL | Status: DC
  Administered 2018-05-05 – 2018-05-09 (×8): 25 mg via ORAL
  Filled 2018-05-05 (×8): qty 1

## 2018-05-05 MED ORDER — SODIUM CHLORIDE 0.9% FLUSH
3.0000 mL | Freq: Two times a day (BID) | INTRAVENOUS | Status: DC
  Administered 2018-05-05 – 2018-05-10 (×7): 3 mL via INTRAVENOUS

## 2018-05-05 MED ORDER — ACETAMINOPHEN 325 MG PO TABS: 650.0000 mg | ORAL_TABLET | ORAL | Status: DC | PRN

## 2018-05-05 MED ORDER — FUROSEMIDE 10 MG/ML IJ SOLN
20.0000 mg | Freq: Once | INTRAMUSCULAR | Status: AC
  Administered 2018-05-05: 20 mg via INTRAVENOUS
  Filled 2018-05-05: qty 2

## 2018-05-05 MED ORDER — DIFLUPREDNATE 0.05 % OP EMUL
1.0000 [drp] | Freq: Three times a day (TID) | OPHTHALMIC | Status: DC
Start: 2018-05-05 — End: 2018-05-08
  Filled 2018-05-05: qty 5

## 2018-05-05 MED ORDER — KETOROLAC TROMETHAMINE 0.5 % OP SOLN
1.0000 [drp] | Freq: Four times a day (QID) | OPHTHALMIC | Status: DC
  Filled 2018-05-05: qty 5

## 2018-05-05 MED ORDER — SODIUM CHLORIDE 0.9 % IV SOLN
INTRAVENOUS | Status: DC
  Administered 2018-05-06: 10:00:00 via INTRAVENOUS

## 2018-05-05 MED ORDER — ROPINIROLE HCL 0.25 MG PO TABS
0.2500 mg | ORAL_TABLET | Freq: Every day | ORAL | Status: DC
  Administered 2018-05-05 – 2018-05-10 (×6): 0.25 mg via ORAL
  Filled 2018-05-05 (×6): qty 1

## 2018-05-05 MED ORDER — SODIUM CHLORIDE 0.9 % IV SOLN
250.0000 mL | INTRAVENOUS | Status: DC | PRN
Start: 2018-05-05 — End: 2018-05-11

## 2018-05-05 MED ORDER — GATIFLOXACIN 0.5 % OP SOLN
1.0000 [drp] | Freq: Four times a day (QID) | OPHTHALMIC | Status: DC
  Filled 2018-05-05: qty 2.5

## 2018-05-05 MED ORDER — NETARSUDIL DIMESYLATE 0.02 % OP SOLN
1.0000 [drp] | Freq: Every day | OPHTHALMIC | Status: DC
  Administered 2018-05-07 – 2018-05-10 (×4): 1 [drp] via OPHTHALMIC

## 2018-05-05 MED ORDER — DILTIAZEM HCL ER COATED BEADS 240 MG PO CP24
240.0000 mg | ORAL_CAPSULE | Freq: Every day | ORAL | Status: DC
  Administered 2018-05-06 – 2018-05-09 (×4): 240 mg via ORAL
  Filled 2018-05-05 (×5): qty 1

## 2018-05-05 NOTE — Telephone Encounter (Signed)
Spoke with daughter and advised her of Dr.Hagler's message below with verbal understanding. She also stated her mom and dad are on the way to [facility] ED in [place]. They feel this is a better ED to go to since it is closer to the AFIb clinic

## 2018-05-05 NOTE — ED Triage Notes (Signed)
Pt presents to ED for assessment of shortness of breath, exhaustion and abdominal/bilateral lower limb swelling.  Hx of a-fib with RVR, shocked once, currently being seen by a-fib clinic and supposed to have more procedures soon.  Was told to come to ER Monday, waited until today due to have multiple animals at home needing her care.

## 2018-05-05 NOTE — Telephone Encounter (Signed)
Called patient back. No answer.

## 2018-05-05 NOTE — Telephone Encounter (Signed)
Spoke with patient and advised her of recent lab results with verbal understanding.   While on the phone with the patient, she was very SOB and had a very hard time talking (sounded more SOB than yesterday during her visit). Her husband took the phone and stated she was "in a bad way this morning. She is very short of breath and her stomach is swollen". I advised him that she needed to be taken to the nearest ED now. He should either call EMS or drive her himself. He stated that's a long drawn out process but they would do it.

## 2018-05-05 NOTE — Telephone Encounter (Signed)
Spoke with patient's daughter. Patient is on her way to [facility] ED

## 2018-05-05 NOTE — ED Provider Notes (Signed)
[facility] EMERGENCY DEPARTMENT Provider Note   CSN: [id number] Arrival date & time: 05/05/18  1146     History   Chief Complaint Chief Complaint  Patient presents with  . Atrial Fibrillation  . Shortness of Breath    HPI Karen Richardson is a 84 y.o. female with PMH/o Anxiety, Afib, CKD, PE, GERD who presents for evaluation of progressively worsening shortness of breath, generalized fatigue, swelling in bilateral feet.  Patient has a history of A. fib and was recently on amiodarone until she was taken off in a few months ago.  Patient reports that ever since she was taken off of it, she is gone in and out of A. fib.  She was seen in the ED on 04/15/2018 after she had gone to outpatient surgery center for cataract surgery and it was found to be in A. fib with RVR.  At that time, was started on a diltiazem drip and admitted.  Eventually was discharged home with cardiac follow-up.  Saw cardiac NP at A. fib clinic on 04/29/2018.  At that time was to try Tikosyn.  Was noted to be therapeutic and INR before hospitalizations.  Had been started on metoprolol 25 mg twice daily for rate control.  Been started on Lasix to help with lower extremity edema.  A. fib clinic had mentioned getting a TEE for evaluation of starting earlier version and starting patient on Tikosyn.  Patient reports that she was supposed to do that next week when she saw the A. fib clinic.  She had seen her primary care doctor yesterday for valuation of persistent shortness of breath.  At that time, she was advised to go to the ED since her symptoms were progressively worsening.  Patient reports that she could not at that time given she had family issues to sort out before going to the ED.  She called PCP again today complaining of worsening shortness of breath.  Was advised to the emergency department.  Note from primary care doctor said that A. fib clinic had noted that they would see patient in the emergency department,  get a TEE and start her on Tikosyn.  She reports that since her symptoms have been worsening, she came to the emergency department.  Patient reports that she can barely walk across her house without getting severely fatigued and short of breath.  Patient reports that he always has some difficulty at baseline but states it is worsened in the last 2 weeks.  Patient denies any fevers, abdominal pain, nausea/vomiting, CP, numbness/weakness.  The history is provided by the patient.    Past Medical History:  Diagnosis Date  . Anxiety   . Atrial fibrillation with RVR (HCC) 12/01/2016  . CKD (chronic kidney disease), stage III (HCC) 12/12/2016  . Depression   . Dysphagia   . GERD (gastroesophageal reflux disease) 12/01/2016  . Heart murmur   . History of esophageal stricture 12/10/2016  . Hyperlipidemia   . Hypertension    x 4 months  . Presbyesophagus 12/11/2016  . Pulmonary emboli (HCC) 12/01/2016  . Pulmonary embolism (HCC)   . Pulmonary emphysema (HCC) 09/06/2017  . Pulmonary nodule   . Seizures (HCC)   . Stroke [facility])     Patient Active Problem List   Diagnosis Date Noted  . Other social stressor 12/17/2017  . Pulmonary emphysema (HCC) 09/06/2017  . Restrictive lung disease 07/02/2017  . Other secondary pulmonary hypertension (HCC) 07/02/2017  . Persistent atrial fibrillation (HCC)   .  Esophageal dysphagia 02/03/2017  . Dizziness 01/07/2017  . Monitoring for long-term anticoagulant use 12/18/2016  . CKD (chronic kidney disease), stage III (HCC) 12/12/2016  . Seizure disorder (HCC) 12/12/2016  . Presbyesophagus 12/11/2016  . History of esophageal stricture 12/10/2016  . Atrial fibrillation with RVR (HCC) 12/01/2016  . Pulmonary emboli (HCC) 12/01/2016  . GERD (gastroesophageal reflux disease) 12/01/2016  . Arthralgia of both knees 01/11/2016  . Hypertension 09/22/2011    Past Surgical History:  Procedure Laterality Date  . ABDOMINAL HYSTERECTOMY    . BREAST ENHANCEMENT SURGERY       40 yrs ago  . CARDIOVERSION N/A 05/03/2017   Procedure: CARDIOVERSION;  Surgeon: Elease Hashimoto Deloris Ping, MD;  Location: [facility] ENDOSCOPY;  Service: Cardiovascular;  Laterality: N/A;  . ESOPHAGEAL DILATION N/A 03/17/2017   Procedure: ESOPHAGEAL DILATION;  Surgeon: Malissa Hippo, MD;  Location: [facility] ENDO SUITE;  Service: Endoscopy;  Laterality: N/A;  . ESOPHAGOGASTRODUODENOSCOPY N/A 03/17/2017   Procedure: ESOPHAGOGASTRODUODENOSCOPY (EGD);  Surgeon: Malissa Hippo, MD;  Location: [facility] ENDO SUITE;  Service: Endoscopy;  Laterality: N/A;  10:30  . PARTIAL HYSTERECTOMY    . SPINE SURGERY     Tumor removal  . Tumor removed from spinal cord     benign     OB History    Gravida  4   Para  4   Term  4   Preterm      AB      Living  2     SAB      TAB      Ectopic      Multiple      Live Births               Home Medications    Prior to Admission medications   Medication Sig Start Date End Date Taking? Authorizing Provider  atorvastatin (LIPITOR) 10 MG tablet TAKE ONE TABLET BY MOUTH EVERY EVENING. 01/11/18  Yes Laqueta Linden, MD  carbamazepine (TEGRETOL XR) 100 MG 12 hr tablet Take 1 tablet by mouth twice daily (morning, bedtime) 05/04/18  Yes Hagler, Fleet Contras, MD  diltiazem (TIAZAC) 240 MG 24 hr capsule TAKE ONE CAPSULE BY MOUTH DAILY. (MORNING) 04/14/18  Yes Loren Racer, MD  DUREZOL 0.05 % EMUL Place 1 drop into the left eye 3 (three) times daily. 03/21/18  Yes [provider]  ketorolac (ACULAR) 0.5 % ophthalmic solution Place 1 drop into the left eye 4 (four) times daily. 03/21/18  Yes [provider]  metoprolol tartrate (LOPRESSOR) 25 MG tablet Take 1 tablet (25 mg total) by mouth 2 (two) times daily. 04/29/18 07/28/18 Yes Newman Nip, NP  moxifloxacin (VIGAMOX) 0.5 % ophthalmic solution Place 1 drop into the left eye 4 (four) times daily. 03/21/18  Yes [provider]  potassium chloride (K-DUR,KLOR-CON) 10 MEQ tablet TAKE ONE TABLET BY MOUTH  TWICE DAILY. (MORNING ,BEDTIME) 04/05/18  Yes Laqueta Linden, MD  RHOPRESSA 0.02 % SOLN Place 1 drop into the left eye at bedtime. 03/21/18  Yes [provider]  Vitamin D, Ergocalciferol, (DRISDOL) 50000 units CAPS capsule TAKE ONE CAPSULE BY MOUTH EVERY 7 DAYS (FRIDAY MORNING) 08/18/17  Yes Eustace Moore, MD  warfarin (COUMADIN) 2.5 MG tablet TAKE AS DIRECTED BY THE ANTICOAGULATION CLINIC Patient taking differently: On Sunday, Wednesday, and Friday take 3.75mg . take 2.5mg  on all other days 02/15/18  Yes Laqueta Linden, MD    Family History Family History  Problem Relation Age of Onset  . Pulmonary  embolism Mother   . Colon cancer Mother        mass in colon  . Stroke Father   . Alcohol abuse Father   . CVA Father   . COPD Father   . Diabetes Sister   . Heart failure Sister   . Brain cancer Sister   . Pancreatic cancer Sister   . Epilepsy Sister   . Schizophrenia Brother   . Alcohol abuse Brother   . Lung disease Neg Hx     Social History Social History   Tobacco Use  . Smoking status: Passive Smoke Exposure - Never Smoker  . Smokeless tobacco: Never Used  . Tobacco comment: Father as a child   Substance Use Topics  . Alcohol use: No  . Drug use: No     Allergies   Codeine   Review of Systems Review of Systems  Constitutional: Negative for fever.  Respiratory: Positive for shortness of breath. Negative for cough.   Cardiovascular: Positive for leg swelling (bilateral). Negative for chest pain.  Gastrointestinal: Negative for abdominal pain, nausea and vomiting.  Genitourinary: Negative for dysuria and hematuria.  Neurological: Negative for weakness, numbness and headaches.  All other systems reviewed and are negative.    Physical Exam Updated Vital Signs BP (!) 124/109   Pulse 72   Temp 97.7 F (36.5 C) (Oral)   Resp 18   Ht 5' 5.5" (1.664 m)   Wt 76.7 kg (169 lb)   SpO2 95%   BMI 27.70 kg/m   Physical Exam  Constitutional:  She is oriented to person, place, and time. She appears well-developed and well-nourished.  HENT:  Head: Normocephalic and atraumatic.  Mouth/Throat: Oropharynx is clear and moist and mucous membranes are normal.  Eyes: Pupils are equal, round, and reactive to light. Conjunctivae, EOM and lids are normal.  Neck: Full passive range of motion without pain.  Cardiovascular: Normal rate and normal heart sounds. An irregularly irregular rhythm present. Exam reveals no gallop and no friction rub.  No murmur heard. Pulses:      Radial pulses are 2+ on the right side, and 2+ on the left side.       Dorsalis pedis pulses are 2+ on the right side, and 2+ on the left side.  Pulmonary/Chest: Effort normal and breath sounds normal.  Abdominal: Soft. Normal appearance. There is no tenderness. There is no rigidity and no guarding.  Musculoskeletal: Normal range of motion.  Bilateral lower extremities are symmetric in appearance.  Nonpitting edema noted to bilateral lower extremity's.  No overlying warmth, erythema.  Neurological: She is alert and oriented to person, place, and time.  Sensation intact along major nerve distributions of BLE  Skin: Skin is warm and dry. Capillary refill takes less than 2 seconds.  Good distal cap refill. BLE are not dusky in appearance or cool to touch.  Psychiatric: She has a normal mood and affect. Her speech is normal.  Nursing note and vitals reviewed.    ED Treatments / Results  Labs (all labs ordered are listed, but only abnormal results are displayed) Labs Reviewed  BASIC METABOLIC PANEL - Abnormal; Notable for the following components:      Result Value   Glucose, Bld 112 (*)    Creatinine, Ser 1.31 (*)    Calcium 8.8 (*)    GFR calc non Af Amer 37 (*)    GFR calc Af Amer 43 (*)    All other components within normal limits  CBC  EKG EKG Interpretation  Date/Time:  Thursday May 05 2018 11:54:08 EDT Ventricular Rate:  104 PR Interval:    QRS  Duration: 72 QT Interval:  356 QTC Calculation: 468 R Axis:   75 Text Interpretation:  Atrial fibrillation with rapid ventricular response Low voltage QRS Abnormal ECG No significant change since last tracing Confirmed by Lorre Nick ([id number]) on 05/05/2018 1:50:59 PM   Radiology Dg Chest 2 View  Result Date: 05/05/2018 CLINICAL DATA:  Atrial fibrillation, irregular heart rate. EXAM: CHEST - 2 VIEW COMPARISON:  04/14/2018. FINDINGS: Cardiomegaly. No consolidation or edema. No effusion or pneumothorax. Chronic lower thoracic compression deformity. IMPRESSION: No active cardiopulmonary disease.  Cardiomegaly. Electronically Signed   By: Elsie Stain M.D.   On: 05/05/2018 12:34    Procedures Procedures (including critical care time)  Medications Ordered in ED Medications - No data to display   Initial Impression / Assessment and Plan / ED Course  I have reviewed the triage vital signs and the nursing notes.  Pertinent labs & imaging results that were available during my care of the patient were reviewed by me and considered in my medical decision making (see chart for details).     84 y.o. F with PMH/o afib, anxiety, GERD who presents for evaluation of progressive worsening shortness of breath and fatigue.  History of A. Fib.  Had been on amiodarone previously but was taken off due to intolerance.  Since then has gone into A. fib with RVR on several occasions.  Had to be cardioverted.  Had been seen by A. fib clinic.  Plan had been to start patient on Tikosyn after TEE and cardioversion.  Patient had been having worsening symptoms and had been advised to go to the ED for evaluation but had declined due to other obligations.  Finally felt like symptoms were worsening and called her PCP.  Talked to the cardiology NP.  Recommended going to ED where A. fib clinic would evaluate patient for possible TEE.  Patient reports worsening shortness of breath.  Denies any fevers, chest pain, abdominal pain,  nausea/vomiting. Patient is afebrile, non-toxic appearing, sitting comfortably on examination table. Vital signs reviewed and stable.  On my evaluation, patient does appear to be irregularly irregular but no evidence of RVR.  Patient is neurovascularly intact.  Suspect that patient's symptoms are likely due to continued A. Fib.  Initial labs ordered at triage.  Will consult cardiology.  BMP shows creatinine at 1.31.  Glucose is slightly elevated.  CBC without any significant leukocytosis or anemia.  X-ray negative for any acute abnormality.  Discussed patient with Angie (cardiology). Will contact EP and cardiology will come see her in the ED.   Discussed with Angie (cardiology) after discussion. EP will consult tomorrow morning. General cardiology will come see patient and decide on cards vs medical admission.   Patient signed out to Rhea Bleacher, PA-C with cardiology consult pending. Anticipate admission with cards vs medicine.   Final Clinical Impressions(s) / ED Diagnoses   Final diagnoses:  Paroxysmal atrial fibrillation [facility])    ED Discharge Orders    None       Rosana Hoes 05/06/18 2250    Lorre Nick, MD 05/07/18 1144

## 2018-05-05 NOTE — Telephone Encounter (Signed)
Agree.  Please call back and see if they have gone to the emergency department.  See if they need for [place] to call 911.

## 2018-05-05 NOTE — Telephone Encounter (Signed)
Please call daughter and advised that I actually spoke with the nurse practitioner in the atrial fibrillation clinic yesterday.  They had offered to go ahead and evaluate her mother in the emergency department and then be admitted.  They were going to plan on doing a TEE and then load her with Tikosyn and cardiovert her.  Her mother decided to wait and follow back up with him early next week.  The  cardiology office was supposed to call her yesterday to get her in for an appointment early next week.

## 2018-05-05 NOTE — Telephone Encounter (Addendum)
Patient's daughter called to afib clinic to inform of pt being in ER for shortness of breath, abdominal swelling didn't feel she could make it to appointment on Tuesday. I did go ahead and let Gypsy Balsam NP with EP know of pt in ER. Planning to move TEE (needed prior to tikosyn admit) to 6/7 in the afternoon - plan to round on patient in the morning.

## 2018-05-05 NOTE — Telephone Encounter (Signed)
Spoke with daughter who was unsure of nature of recent visit in our office. Informed daughter daughter pt in office for INR check, EKG and that pt was not seen by provider. Daughter voiced understanding and stated that she would call the other office.

## 2018-05-05 NOTE — Progress Notes (Signed)
    [facility] has been requested to perform a transesophageal echocardiogram on 05/06/18 for tikosyn evaluation.  After careful review of history and examination, the risks and benefits of transesophageal echocardiogram have been explained including risks of esophageal damage, perforation (1:10,000 risk), bleeding, pharyngeal hematoma as well as other potential complications associated with conscious sedation including aspiration, arrhythmia, respiratory failure and death. Alternatives to treatment were discussed, questions were answered. Patient is willing to proceed.   TEE scheduled for 05/06/18 at 4pm with Dr. Leitha Schuller, PA-C 05/05/2018 6:12 PM

## 2018-05-05 NOTE — Telephone Encounter (Signed)
Spoke with daughter this morning (DPR) and she had since found out information regarding her mothers situation. She is upset because she can't seem to get in to see her cardiologist and "one hand doesn't know what the other is doing" I voiced my understanding. Reviewed recent lab work of her mother's with her and she is concerned with her cardiologist won't see it. I told her that it is all part of cone and it can all be seen by [place]. She said her mother can't get in to see her cardiologist. I told her I would forward a message to Dr.Hagler letting her know her concern.

## 2018-05-05 NOTE — Telephone Encounter (Signed)
Please call and advised that her kidney function and electrolytes are stable.  Advise her that her thyroid function is low.  She will need an increase of her medication, but I am somewhat leery about initiating thyroid medication with her being in atrial fibrillation at this time.  We will defer this until a follow-up visit or until after she has been started on Tikosyn.  Her liver function tests are elevated.  They are not elevated to the level that she has to stop her cholesterol medication, but they will need to be rechecked in a couple months.  Please advise patient that she will need to review her lab testing with cardiology.
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
## 2018-05-05 NOTE — ED Provider Notes (Signed)
4:21 PM Pt with afib -- plan for consult. Control of afib has been a problem for patient.   Plan: is for cardiology consult for appropriate disposition.   BP 125/88   Pulse 83   Temp 97.7 F (36.5 C) (Oral)   Resp 20   Ht 5' 5.5" (1.664 m)   Wt 76.7 kg (169 lb)   SpO2 96%   BMI 27.70 kg/m   Cardiology has seen. Pt admitted to cardiology service.    Renne Crigler, PA-C 05/05/18 2200    Terrilee Files, MD 05/07/18 1240

## 2018-05-06 ENCOUNTER — Encounter (HOSPITAL_COMMUNITY): Payer: Self-pay

## 2018-05-06 ENCOUNTER — Ambulatory Visit (HOSPITAL_COMMUNITY): Admit: 2018-05-06 | Payer: Medicare HMO | Admitting: Cardiovascular Disease

## 2018-05-06 ENCOUNTER — Encounter (HOSPITAL_COMMUNITY)
Admission: EM | Disposition: A | Discharge status: Home Health | Payer: Self-pay | Source: Home / Self Care | Attending: Cardiovascular Disease

## 2018-05-06 ENCOUNTER — Encounter: Payer: Self-pay | Admitting: Family Medicine

## 2018-05-06 ENCOUNTER — Inpatient Hospital Stay (HOSPITAL_COMMUNITY): Payer: Medicare HMO

## 2018-05-06 DIAGNOSIS — I340 Nonrheumatic mitral (valve) insufficiency: Secondary | ICD-10-CM

## 2018-05-06 DIAGNOSIS — I351 Nonrheumatic aortic (valve) insufficiency: Secondary | ICD-10-CM

## 2018-05-06 HISTORY — PX: TEE WITHOUT CARDIOVERSION: SHX5443

## 2018-05-06 LAB — BASIC METABOLIC PANEL
ANION GAP: 8 (ref 5–15)
Anion gap: 10 (ref 5–15)
BUN: 19 mg/dL (ref 6–20)
BUN: 20 mg/dL (ref 6–20)
CHLORIDE: 110 mmol/L (ref 101–111)
CO2: 22 mmol/L (ref 22–32)
CO2: 23 mmol/L (ref 22–32)
Calcium: 8.5 mg/dL — ABNORMAL LOW (ref 8.9–10.3)
Calcium: 8.4 mg/dL — ABNORMAL LOW (ref 8.9–10.3)
Chloride: 110 mmol/L (ref 101–111)
Creatinine, Ser: 1.44 mg/dL — ABNORMAL HIGH (ref 0.44–1.00)
Creatinine, Ser: 1.63 mg/dL — ABNORMAL HIGH (ref 0.44–1.00)
GFR calc Af Amer: 38 mL/min — ABNORMAL LOW (ref >60)
GFR calc non Af Amer: 28 mL/min — ABNORMAL LOW (ref >60)
GFR, EST AFRICAN AMERICAN: 33 mL/min — AB (ref >60)
GFR, EST NON AFRICAN AMERICAN: 33 mL/min — AB (ref >60)
Glucose, Bld: 107 mg/dL — ABNORMAL HIGH (ref 65–99)
Glucose, Bld: 126 mg/dL — ABNORMAL HIGH (ref 65–99)
POTASSIUM: 3.8 mmol/L (ref 3.5–5.1)
POTASSIUM: 4.1 mmol/L (ref 3.5–5.1)
SODIUM: 142 mmol/L (ref 135–145)
SODIUM: 141 mmol/L (ref 135–145)

## 2018-05-06 LAB — MAGNESIUM
MAGNESIUM: 2 mg/dL (ref 1.7–2.4)
Magnesium: 2 mg/dL (ref 1.7–2.4)

## 2018-05-06 LAB — PROTIME-INR
INR: 2.48
Prothrombin Time: 26.6 seconds — ABNORMAL HIGH (ref 11.4–15.2)

## 2018-05-06 SURGERY — ECHOCARDIOGRAM, TRANSESOPHAGEAL
Anesthesia: Moderate Sedation

## 2018-05-06 MED ORDER — SODIUM CHLORIDE 0.9% FLUSH: 3.0000 mL | INTRAVENOUS | Status: DC | PRN

## 2018-05-06 MED ORDER — BUTAMBEN-TETRACAINE-BENZOCAINE 2-2-14 % EX AERO
INHALATION_SPRAY | CUTANEOUS | Status: DC | PRN
  Administered 2018-05-06: 2 via TOPICAL

## 2018-05-06 MED ORDER — WARFARIN SODIUM 2 MG PO TABS
4.0000 mg | ORAL_TABLET | Freq: Once | ORAL | Status: AC
  Administered 2018-05-06: 4 mg via ORAL
  Filled 2018-05-06: qty 2

## 2018-05-06 MED ORDER — POTASSIUM CHLORIDE CRYS ER 20 MEQ PO TBCR
20.0000 meq | EXTENDED_RELEASE_TABLET | Freq: Once | ORAL | Status: AC
  Administered 2018-05-06: 20 meq via ORAL
  Filled 2018-05-06: qty 1

## 2018-05-06 MED ORDER — WARFARIN - PHARMACIST DOSING INPATIENT
Freq: Every day | Status: DC
  Administered 2018-05-09: 18:00:00

## 2018-05-06 MED ORDER — FENTANYL CITRATE (PF) 100 MCG/2ML IJ SOLN
INTRAMUSCULAR | Status: AC
  Filled 2018-05-06: qty 2

## 2018-05-06 MED ORDER — DOFETILIDE 250 MCG PO CAPS: 250.0000 ug | ORAL_CAPSULE | Freq: Two times a day (BID) | ORAL | Status: DC

## 2018-05-06 MED ORDER — MIDAZOLAM HCL 10 MG/2ML IJ SOLN
INTRAMUSCULAR | Status: DC | PRN
  Administered 2018-05-06 (×2): 2 mg via INTRAVENOUS

## 2018-05-06 MED ORDER — DIPHENHYDRAMINE HCL 50 MG/ML IJ SOLN
INTRAMUSCULAR | Status: AC
  Filled 2018-05-06: qty 1

## 2018-05-06 MED ORDER — SODIUM CHLORIDE 0.9% FLUSH
3.0000 mL | Freq: Two times a day (BID) | INTRAVENOUS | Status: DC
  Administered 2018-05-06 – 2018-05-10 (×6): 3 mL via INTRAVENOUS

## 2018-05-06 MED ORDER — FENTANYL CITRATE (PF) 100 MCG/2ML IJ SOLN
INTRAMUSCULAR | Status: DC | PRN
  Administered 2018-05-06 (×2): 25 ug via INTRAVENOUS

## 2018-05-06 MED ORDER — DOFETILIDE 125 MCG PO CAPS
125.0000 ug | ORAL_CAPSULE | Freq: Two times a day (BID) | ORAL | Status: DC
  Administered 2018-05-06: 125 ug via ORAL
  Filled 2018-05-06: qty 1

## 2018-05-06 MED ORDER — SODIUM CHLORIDE 0.9 % IV SOLN: 250.0000 mL | INTRAVENOUS | Status: DC | PRN

## 2018-05-06 MED ORDER — MIDAZOLAM HCL 5 MG/ML IJ SOLN
INTRAMUSCULAR | Status: AC
  Filled 2018-05-06: qty 2

## 2018-05-06 NOTE — Progress Notes (Signed)
ANTICOAGULATION CONSULT NOTE - Initial Consult  Pharmacy Consult:  Coumadin Indication: atrial fibrillation  Allergies  Allergen Reactions  . Codeine Nausea Only    Patient Measurements: Height: 5' 5.5" (166.4 cm) Weight: 167 lb 11.2 oz (76.1 kg) IBW/kg (Calculated) : 58.15  Vital Signs: Temp: 97.7 F (36.5 C) (06/07 1155) Temp Source: Oral (06/07 1155) BP: 109/86 (06/07 1234) Pulse Rate: 83 (06/07 1234)  Labs: Recent Labs    05/04/18 1005 05/04/18 1209 05/05/18 1208 05/06/18 0637  HGB  --   --  12.1  --   HCT  --   --  38.4  --   PLT  --   --  199  --   LABPROT  --   --   --  26.6*  INR 2.8  --   --  2.48  CREATININE  --  1.32* 1.31* 1.44*    Estimated Creatinine Clearance: 31.1 mL/min (A) (by C-G formula based on SCr of 1.44 mg/dL (H)).   Medical History: Past Medical History:  Diagnosis Date  . Acute on chronic diastolic (congestive) heart failure (HCC)   . Anxiety   . Atrial fibrillation with RVR (HCC) 12/01/2016  . CKD (chronic kidney disease), stage III (HCC) 12/12/2016  . Depression   . Dysphagia   . GERD (gastroesophageal reflux disease) 12/01/2016  . Heart murmur   . History of esophageal stricture 12/10/2016  . Hyperlipidemia   . Hypertension    x 4 months  . Presbyesophagus 12/11/2016  . Pulmonary emboli (HCC) 12/01/2016  . Pulmonary embolism (HCC)   . Pulmonary emphysema (HCC) 09/06/2017  . Pulmonary nodule   . Seizures (HCC)   . Stroke [facility])      Assessment: 85 YOF presented with SOB.  Pharmacy consulted to continue Coumadin from PTA for history of Afib.  INR therapeutic on admit; no bleeding reported.  Patient missed Coumadin dose on 05/05/18.  Home Coumadin regimen:  2.5mg  daily except 3.75mg  on WFSu   Goal of Therapy:  INR 2-3 Monitor platelets by anticoagulation protocol: Yes    Plan:  Coumadin 4mg  PO today Daily PT / INR   Haely Leyland D. Laney Potash, PharmD, BCPS, BCCCP Pager:  [phone] 05/06/2018, 12:44 PM

## 2018-05-06 NOTE — Progress Notes (Addendum)
Benefit check in progress for Karen Richardson [phone]  RE: Benefit check  Received: Today  Message Contents  Memory Argue CMA        # 7.  S/W ELIZABETH @ [facility] [employer]  # [id number]    1. TIKOSYN 125 MCG BID  COVER- NOT COVER  PRIOR APPROVAL- YES # [phone] FOR EXCEPTION    2. DOFETILIDE 125 MCG BID  COVER -YES  CO-PAY- $ 95.00  TIER- 4 DRUG  PRIOR APPROVAL- NO    DEDUCTIBLE : NOT MET   PREFERRED PHARMACY : YES  [facility]/

## 2018-05-06 NOTE — Progress Notes (Signed)
  Echocardiogram Echocardiogram Transesophageal has been performed.  Karen Richardson 05/06/2018, 12:08 PM

## 2018-05-06 NOTE — Progress Notes (Signed)
This RN and charge RN checked labs for patient to receive Tikosyn this evening. K+ was below 4. Labs were reordered.    K+ is now 4.1 and magnesium is 2.0- will give med

## 2018-05-06 NOTE — Discharge Instructions (Addendum)
Supplemental Discharge Instructions for  Pacemaker/Defibrillator Patients  Activity No heavy lifting or vigorous activity with your left/right arm for 6 to 8 weeks.  Do not raise your left/right arm above your head for one week.  Gradually raise your affected arm as drawn below.              05/13/18                     05/14/18                   05/15/18                   05/16/18     __  NO DRIVING for  1 week   ; you may begin driving on   07/10/90  .  WOUND CARE - Keep the wound area clean and dry.  Do not get this area wet for one week. No showers for24 hours; you may shower on  05/10/18 evening  . - The tape/steri-strips on your wound will fall off; do not pull them off.  No bandage is needed on the site.  DO  NOT apply any creams, oils, or ointments to the wound area. - If you notice any drainage or discharge from the wound, any swelling or bruising at the site, or you develop a fever > 101? F after you are discharged home, call the office at once.  Special Instructions - You are still able to use cellular telephones; use the ear opposite the side where you have your pacemaker/defibrillator.  Avoid carrying your cellular phone near your device. - When traveling through airports, show security personnel your identification card to avoid being screened in the metal detectors.  Ask the security personnel to use the hand wand. - Avoid arc welding equipment, MRI testing (magnetic resonance imaging), TENS units (transcutaneous nerve stimulators).  Call the office for questions about other devices. - Avoid electrical appliances that are in poor condition or are not properly grounded. - Microwave ovens are safe to be near or to operate.  Additional information for defibrillator patients should your device go off: - If your device goes off ONCE and you feel fine afterward, notify the device clinic nurses. - If your device goes off ONCE and you do not feel well afterward, call 911. - If your  device goes off TWICE, call 911. - If your device goes off THREE times in one day, call 911.  DO NOT DRIVE YOURSELF OR A FAMILY MEMBER WITH A DEFIBRILLATOR TO THE HOSPITAL--CALL 911.    Information on my medicine - Coumadin   (Warfarin)  This medication education was reviewed with me or my healthcare representative as part of my discharge preparation.  The pharmacist that spoke with me during my hospital stay was:  Lennon Alstrom, [facility]  Why was Coumadin prescribed for you? Coumadin was prescribed for you because you have a blood clot or a medical condition that can cause an increased risk of forming blood clots. Blood clots can cause serious health problems by blocking the flow of blood to the heart, lung, or brain. Coumadin can prevent harmful blood clots from forming. As a reminder your indication for Coumadin is:   Stroke Prevention Because Of Atrial Fibrillation  What test will check on my response to Coumadin? While on Coumadin (warfarin) you will need to have an INR test regularly to ensure that your dose is keeping you in the  desired range. The INR (international normalized ratio) number is calculated from the result of the laboratory test called prothrombin time (PT).  If an INR APPOINTMENT HAS NOT ALREADY BEEN MADE FOR YOU please schedule an appointment to have this lab work done by your health care provider within 7 days. Your INR goal is usually a number between:  2 to 3 or your provider may give you a more narrow range like 2-2.5.  Ask your health care provider during an office visit what your goal INR is.  What  do you need to  know  About  COUMADIN? Take Coumadin (warfarin) exactly as prescribed by your healthcare provider about the same time each day.  DO NOT stop taking without talking to the doctor who prescribed the medication.  Stopping without other blood clot prevention medication to take the place of Coumadin may increase your risk of developing a new clot or stroke.   Get refills before you run out.  What do you do if you miss a dose? If you miss a dose, take it as soon as you remember on the same day then continue your regularly scheduled regimen the next day.  Do not take two doses of Coumadin at the same time.  Important Safety Information A possible side effect of Coumadin (Warfarin) is an increased risk of bleeding. You should call your healthcare provider right away if you experience any of the following: ? Bleeding from an injury or your nose that does not stop. ? Unusual colored urine (red or dark brown) or unusual colored stools (red or black). ? Unusual bruising for unknown reasons. ? A serious fall or if you hit your head (even if there is no bleeding).  Some foods or medicines interact with Coumadin (warfarin) and might alter your response to warfarin. To help avoid this: ? Eat a balanced diet, maintaining a consistent amount of Vitamin K. ? Notify your provider about major diet changes you plan to make. ? Avoid alcohol or limit your intake to 1 drink for women and 2 drinks for men per day. (1 drink is 5 oz. wine, 12 oz. beer, or 1.5 oz. liquor.)  Make sure that ANY health care provider who prescribes medication for you knows that you are taking Coumadin (warfarin).  Also make sure the healthcare provider who is monitoring your Coumadin knows when you have started a new medication including herbals and non-prescription products.  Coumadin (Warfarin)  Major Drug Interactions  Increased Warfarin Effect Decreased Warfarin Effect  Alcohol (large quantities) Antibiotics (esp. Septra/Bactrim, Flagyl, Cipro) Amiodarone (Cordarone) Aspirin (ASA) Cimetidine (Tagamet) Megestrol (Megace) NSAIDs (ibuprofen, naproxen, etc.) Piroxicam (Feldene) Propafenone (Rythmol SR) Propranolol (Inderal) Isoniazid (INH) Posaconazole (Noxafil) Barbiturates (Phenobarbital) Carbamazepine (Tegretol) Chlordiazepoxide (Librium) Cholestyramine  (Questran) Griseofulvin Oral Contraceptives Rifampin Sucralfate (Carafate) Vitamin K   Coumadin (Warfarin) Major Herbal Interactions  Increased Warfarin Effect Decreased Warfarin Effect  Garlic Ginseng Ginkgo biloba Coenzyme Q10 Green tea St. Johns wort    Coumadin (Warfarin) FOOD Interactions  Eat a consistent number of servings per week of foods HIGH in Vitamin K (1 serving =  cup)  Collards (cooked, or boiled & drained) Kale (cooked, or boiled & drained) Mustard greens (cooked, or boiled & drained) Parsley *serving size only =  cup Spinach (cooked, or boiled & drained) Swiss chard (cooked, or boiled & drained) Turnip greens (cooked, or boiled & drained)  Eat a consistent number of servings per week of foods MEDIUM-HIGH in Vitamin K (1 serving = 1 cup)  Asparagus (  cooked, or boiled & drained) Broccoli (cooked, boiled & drained, or raw & chopped) Brussel sprouts (cooked, or boiled & drained) *serving size only =  cup Lettuce, raw (green leaf, endive, romaine) Spinach, raw Turnip greens, raw & chopped   These websites have more information on Coumadin (warfarin):  http://www.king-russell.com/; https://www.hines.net/;

## 2018-05-06 NOTE — CV Procedure (Signed)
Transesophageal Echo  Pt sedated with Versed and Fentanyl  TEE probe advanced to mid esophagus without difficulty  LA, LAA, RA without masses AV mildly thickened, calcified   Trace AI MV normal   mIld MR TV normal   Moderate to severe TR RA is dilated  PV is normal   Mild PI DIfficult to assess LVE with angle of heart Mild fixed plaquing of thoracic aorta NO PFO as tested by color doppler only.  Procedure without complication    Dietrich Pates

## 2018-05-06 NOTE — Progress Notes (Signed)
Case manager aware of tikosyn loading

## 2018-05-06 NOTE — Progress Notes (Signed)
Amber PA for EP studies aware of completed EKG in chart, and case manager informed for tikosyn loading. New verbal order placed for cardiac diet

## 2018-05-06 NOTE — Progress Notes (Signed)
Called pharmacist to make sue that all night meds could be given along with Tikosyn tonight. Approved to give.

## 2018-05-06 NOTE — Progress Notes (Signed)
Spoke to [employer], stated to give all morning medication Informed pt she will be transported shortly Pt called husband to inform  Consent signed in chart
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
## 2018-05-06 NOTE — Interval H&P Note (Signed)
History and Physical Interval Note:  05/06/2018 9:02 AM  Karen Richardson  has presented today for surgery, with the diagnosis of A-FIB  The various methods of treatment have been discussed with the patient and family. After consideration of risks, benefits and other options for treatment, the patient has consented to  Procedure(s): TRANSESOPHAGEAL ECHOCARDIOGRAM (TEE) (N/A) as a surgical intervention .  The patient's history has been reviewed, patient examined, no change in status, stable for surgery.  I have reviewed the patient's chart and labs.  Questions were answered to the patient's satisfaction.     Dietrich Pates

## 2018-05-06 NOTE — Progress Notes (Signed)
Paged cardiology for pt diet order  Pt now alert and oriented, speech is clear  EKG completed and placed in chart

## 2018-05-06 NOTE — Progress Notes (Signed)
Pharmacy Review for Dofetilide (Tikosyn) Initiation  Admit Complaint: 84 y.o. female admitted 05/05/2018 with atrial fibrillation to be initiated on dofetilide.   Assessment:  Patient Exclusion Criteria: If any screening criteria checked as "Yes", then  patient  should NOT receive dofetilide until criteria item is corrected. If "Yes" please indicate correction plan.  YES  NO Patient  Exclusion Criteria Correction Plan  [x]  []  Baseline QTc interval is greater than or equal to 440 msec. IF above YES box checked dofetilide contraindicated unless patient has ICD; then may proceed if QTc 500-550 msec or with known ventricular conduction abnormalities may proceed with QTc 550-600 msec. QTc = 468 ms but in AFib >> ok per [employer]   []  [x]  Magnesium level is less than 1.8 mEq/l : Last magnesium:  Lab Results  Component Value Date   MG 2.0 05/06/2018         [x]  []  Potassium level is less than 4 mEq/l : Last potassium:  Lab Results  Component Value Date   K 3.8 05/06/2018        KCL PO x 1, BMET in AM  []  [x]  Patient is known or suspected to have a digoxin level greater than 2 ng/ml: No results found for: DIGOXIN    []  [x]  Creatinine clearance less than 20 ml/min (calculated using Cockcroft-Gault, actual body weight and serum creatinine): Estimated Creatinine Clearance: 31.1 mL/min (A) (by C-G formula based on SCr of 1.44 mg/dL (H)).   CrCL 35 ml/min  []  [x]  Patient has received drugs known to prolong the QT intervals within the last 48 hours (phenothiazines, tricyclics or tetracyclic antidepressants, erythromycin, H-1 antihistamines, cisapride, fluoroquinolones, azithromycin). Drugs not listed above may have an, as yet, undetected potential to prolong the QT interval, updated information on QT prolonging agents is available at this website:QT prolonging agents   []  [x]  Patient received a dose of hydrochlorothiazide (Oretic) alone or in any combination including triamterene  (Dyazide, Maxzide) in the last 48 hours.   []  [x]  Patient received a medication known to increase dofetilide plasma concentrations prior to initial dofetilide dose:  . Trimethoprim (Primsol, Proloprim) in the last 36 hours . Verapamil (Calan, Verelan) in the last 36 hours or a sustained release dose in the last 72 hours . Megestrol (Megace) in the last 5 days  . Cimetidine (Tagamet) in the last 6 hours . Ketoconazole (Nizoral) in the last 24 hours . Itraconazole (Sporanox) in the last 48 hours  . Prochlorperazine (Compazine) in the last 36 hours    []  [x]  Patient is known to have a history of torsades de pointes; congenital or acquired long QT syndromes.   []  [x]  Patient has received a Class 1 antiarrhythmic with less than 2 half-lives since last dose. (Disopyramide, Quinidine, Procainamide, Lidocaine, Mexiletine, Flecainide, Propafenone)   []  [x]  Patient has received amiodarone therapy in the past 3 months or amiodarone level is greater than 0.3 ng/ml.    Patient has been appropriately anticoagulated with Coumadin.  Ordering provider was confirmed at [email or web address] if they are not listed on the [facility] Authorized Prescribers list.  Goal of Therapy: Follow renal function, electrolytes, potential drug interactions, and dose adjustment. Provide education and 1 week supply at discharge.  Plan:  [x]   Physician selected initial dose within range recommended for patients level of renal function - will monitor for response.  []   Physician selected initial dose outside of range recommended for patients level of renal function - will discuss if the dose should be  altered at this time.   Select One Calculated CrCl  Dose q12h  []  > 60 ml/min 500 mcg  []  40-60 ml/min 250 mcg  [x]  20-40 ml/min 125 mcg   2. Follow up QTc after the first 5 doses, renal function, electrolytes (K & Mg) daily x 3     days, dose adjustment, success of initiation and facilitate 1 week discharge supply as      clinically indicated.  3. Initiate Tikosyn education video (Call [phone] and ask for Tikosyn Video # 116).  4. Place Enrollment Form on the chart for discharge supply of dofetilide.   Jade Burkard D. Laney Potash, PharmD, BCPS, BCCCP Pager:  [phone] 05/06/2018, 12:54 PM

## 2018-05-07 DIAGNOSIS — R0602 Shortness of breath: Secondary | ICD-10-CM

## 2018-05-07 LAB — BASIC METABOLIC PANEL
Anion gap: 7 (ref 5–15)
BUN: 21 mg/dL — AB (ref 6–20)
CHLORIDE: 110 mmol/L (ref 101–111)
CO2: 24 mmol/L (ref 22–32)
CREATININE: 1.4 mg/dL — AB (ref 0.44–1.00)
Calcium: 8.4 mg/dL — ABNORMAL LOW (ref 8.9–10.3)
GFR calc Af Amer: 39 mL/min — ABNORMAL LOW (ref >60)
GFR calc non Af Amer: 34 mL/min — ABNORMAL LOW (ref >60)
GLUCOSE: 100 mg/dL — AB (ref 65–99)
Potassium: 4.3 mmol/L (ref 3.5–5.1)
Sodium: 141 mmol/L (ref 135–145)

## 2018-05-07 LAB — MAGNESIUM: Magnesium: 2.3 mg/dL (ref 1.7–2.4)

## 2018-05-07 LAB — PROTIME-INR
INR: 2.42
Prothrombin Time: 26.1 seconds — ABNORMAL HIGH (ref 11.4–15.2)

## 2018-05-07 MED ORDER — WARFARIN SODIUM 2.5 MG PO TABS
2.5000 mg | ORAL_TABLET | Freq: Once | ORAL | Status: AC
  Administered 2018-05-07: 2.5 mg via ORAL
  Filled 2018-05-07: qty 1

## 2018-05-07 NOTE — Progress Notes (Signed)
ANTICOAGULATION CONSULT NOTE - follow up  Pharmacy Consult:  warfarin Indication: atrial fibrillation  Allergies  Allergen Reactions  . Codeine Nausea Only    Patient Measurements: Height: 5' 5.5" (166.4 cm) Weight: 168 lb 14.4 oz (76.6 kg)(scale b) IBW/kg (Calculated) : 58.15  Vital Signs: Temp: 97.6 F (36.4 C) (06/08 0354) Temp Source: Oral (06/08 0354) BP: 122/89 (06/08 0354) Pulse Rate: 47 (06/08 0354)  Labs: Recent Labs    05/04/18 1005  05/05/18 1208 05/06/18 0637 05/06/18 2051 05/07/18 0710  HGB  --   --  12.1  --   --   --   HCT  --   --  38.4  --   --   --   PLT  --   --  199  --   --   --   LABPROT  --   --   --  26.6*  --  26.1*  INR 2.8  --   --  2.48  --  2.42  CREATININE  --    < > 1.31* 1.44* 1.63* 1.40*   < > = values in this interval not displayed.    Estimated Creatinine Clearance: 32.1 mL/min (A) (by C-G formula based on SCr of 1.4 mg/dL (H)).   Assessment: 68 YOF presented with SOB.  Pharmacy consulted to continue warfarin PTA medication for history of Afib.  Patient reported missing warfarin dose on 6/6. INR is therapeutic today at 2.42. Hemoglobin and platelets are stable. No noted concerns for bleeding at this time.   PTA warfarin dosing:  2.5mg  daily except 3.75mg  on WFSu  Goal of Therapy:  INR 2-3 Monitor platelets by anticoagulation protocol: Yes   Plan:  Warfarin 2.5mg  PO x 1  Daily CBC/ INR  Monitor for [place] of bleeding   Blake Divine, Pharm.D. PGY1 Pharmacy Resident 05/07/2018 9:49 AM Main Pharmacy: [phone]

## 2018-05-07 NOTE — Progress Notes (Signed)
Progress Note  Patient Name: Karen Richardson Date of Encounter: 05/07/2018  Primary Cardiologist: SKo Primary Electrophysiologist: GT   Patient Profile     84 y.o. female recurrent atrial fibrillation with rapid rate.  Previously treated with amiodarone was discontinued because of neuropathy and skin discoloration.  Admitted TEE guided Tikosyn initiation. Unfortunately, QT intervals are prolonged markedly on her low dose of dofetilide from his discontinuation. echocardiogram demonstrates normal LV function severe biatrial enlargement (left atrial volume 43) No known coronary artery disease  Subjective   Without complaints of chest pain shortness of breath.  Pointed with the dofetilide issue  Inpatient Medications    Scheduled Meds: . atorvastatin  10 mg Oral QPM  . carbamazepine  100 mg Oral BID  . Difluprednate  1 drop Left Eye TID  . diltiazem  240 mg Oral Daily  . gatifloxacin  1 drop Left Eye QID  . ketorolac  1 drop Left Eye QID  . metoprolol tartrate  25 mg Oral BID  . Netarsudil Dimesylate  1 drop Left Eye QHS  . potassium chloride  10 mEq Oral Daily  . rOPINIRole  0.25 mg Oral QHS  . sodium chloride flush  3 mL Intravenous Q12H  . sodium chloride flush  3 mL Intravenous Q12H  . warfarin  2.5 mg Oral ONCE-1800  . Warfarin - Pharmacist Dosing Inpatient   Does not apply q1800   Continuous Infusions: . sodium chloride    . sodium chloride     PRN Meds: sodium chloride, sodium chloride, acetaminophen, ondansetron (ZOFRAN) IV, sodium chloride flush, sodium chloride flush   Vital Signs    Vitals:   05/07/18 0010 05/07/18 0354 05/07/18 1100 05/07/18 1140  BP: 100/85 122/89 133/81 123/88  Pulse: 84 (!) 47 73 90  Resp: 13 14  20   Temp: 97.9 F (36.6 C) 97.6 F (36.4 C)  (!) 97.5 F (36.4 C)  TempSrc: Oral Oral  Oral  SpO2: 93% 97%  91%  Weight:  168 lb 14.4 oz (76.6 kg)    Height:        Intake/Output Summary (Last 24 hours) at 05/07/2018 1348 Last data filed  at 05/07/2018 1100 Gross per 24 hour  Intake 315 ml  Output 1500 ml  Net -1185 ml   Filed Weights   05/05/18 2217 05/06/18 0549 05/07/18 0354  Weight: 169 lb 3.2 oz (76.7 kg) 167 lb 11.2 oz (76.1 kg) 168 lb 14.4 oz (76.6 kg)    Telemetry    afib RVR  - Personally Reviewed  ECG    QTc > 500  - Personally Reviewed  Physical Exam   GEN: No acute distress.   Neck: JVD 8 cm Cardiac: Irregular rate and rhythm with a 2/6 systolic murmurs, rubs, or gallops.  Respiratory: Clear to auscultation bilaterally. GI: Soft, nontender, non-distended  MS:   edema; No deformity. Neuro:  Nonfocal  Psych: Normal affect  Skin Warm and dry   Labs    Chemistry Recent Labs  Lab 05/04/18 1209  05/06/18 0637 05/06/18 2051 05/07/18 0710  NA 142   < > 142 141 141  K 4.9   < > 3.8 4.1 4.3  CL 109   < > 110 110 110  CO2 24   < > 22 23 24   GLUCOSE 92   < > 107* 126* 100*  BUN 22   < > 19 20 21*  CREATININE 1.32*   < > 1.44* 1.63* 1.40*  CALCIUM 9.2   < >  8.5* 8.4* 8.4*  PROT 6.4  --   --   --   --   AST 57*  --   --   --   --   ALT 74*  --   --   --   --   BILITOT 0.5  --   --   --   --   GFRNONAA 37*   < > 33* 28* 34*  GFRAA 43*   < > 38* 33* 39*  ANIONGAP  --    < > 10 8 7    < > = values in this interval not displayed.     Hematology Recent Labs  Lab 05/05/18 1208  WBC 5.4  RBC 3.94  HGB 12.1  HCT 38.4  MCV 97.5  MCH 30.7  MCHC 31.5  RDW 14.6  PLT 199    Cardiac EnzymesNo results for input(s): TROPONINI in the last 168 hours. No results for input(s): TROPIPOC in the last 168 hours.   BNPNo results for input(s): BNP, PROBNP in the last 168 hours.   DDimer No results for input(s): DDIMER in the last 168 hours.   Radiology    No results found.  Cardiac Studies   TEE demonstrated no atrial clot Device Interrogation      Assessment & Plan    Atrial fibrillation-persistent with rapid ventricular response  Intolerance of amiodarone and dofetilide  LV  function-normal  Biatrial enlargement  Renal insufficiency grade 3 acute on chronic   The patient's atrial fibrillation.  She is a candidate for dofetilide (or sotalol) secondary to QT prolongation.  She is intolerant of them.  Have not sanguine through the use of dronaderone.  2 alternatives present themselves.  The first is is a class Ic drug.  The likelihood that is affected is relatively low given the significance of her biatrial enlargement.  Alternative would be to consider pacing and AV junction ablation  I will review these options with her in more detail.  Renal insufficiency is somewhat improved.  Signed, Sherryl Manges, MD  05/07/2018, 1:48 PM

## 2018-05-07 NOTE — Progress Notes (Signed)
Page sent to Cardiology NP; K 4.1 QTC 505 up from 477.

## 2018-05-07 NOTE — Progress Notes (Signed)
Patient's daughter, Sedalia Muta, updated. Pt on phone with husband, informed that daughter was on the phone trying to call her.

## 2018-05-07 NOTE — Progress Notes (Signed)
Recheck EKG for patient: QTc is 516. MD on call notified. Awaiting any further orders. Charge RN aware

## 2018-05-07 NOTE — Progress Notes (Signed)
Patient resting comfortably during shift report. Denies complaints.  

## 2018-05-08 ENCOUNTER — Encounter (HOSPITAL_COMMUNITY): Payer: Self-pay | Admitting: Internal Medicine

## 2018-05-08 LAB — BASIC METABOLIC PANEL
ANION GAP: 8 (ref 5–15)
BUN: 20 mg/dL (ref 6–20)
CALCIUM: 8.4 mg/dL — AB (ref 8.9–10.3)
CO2: 20 mmol/L — AB (ref 22–32)
CREATININE: 1.24 mg/dL — AB (ref 0.44–1.00)
Chloride: 110 mmol/L (ref 101–111)
GFR, EST AFRICAN AMERICAN: 46 mL/min — AB (ref >60)
GFR, EST NON AFRICAN AMERICAN: 39 mL/min — AB (ref >60)
Glucose, Bld: 91 mg/dL (ref 65–99)
Potassium: 4.2 mmol/L (ref 3.5–5.1)
SODIUM: 138 mmol/L (ref 135–145)

## 2018-05-08 LAB — PROTIME-INR
INR: 2.3
PROTHROMBIN TIME: 25.1 s — AB (ref 11.4–15.2)

## 2018-05-08 LAB — SURGICAL PCR SCREEN
MRSA, PCR: POSITIVE — AB
Staphylococcus aureus: POSITIVE — AB

## 2018-05-08 LAB — MAGNESIUM: MAGNESIUM: 2.2 mg/dL (ref 1.7–2.4)

## 2018-05-08 MED ORDER — CHLORHEXIDINE GLUCONATE 4 % EX LIQD
60.0000 mL | Freq: Once | CUTANEOUS | Status: AC
  Administered 2018-05-08: 4 via TOPICAL
  Filled 2018-05-08: qty 15

## 2018-05-08 MED ORDER — CHLORHEXIDINE GLUCONATE 4 % EX LIQD
60.0000 mL | Freq: Once | CUTANEOUS | Status: AC
  Administered 2018-05-09: 4 via TOPICAL
  Filled 2018-05-08: qty 60

## 2018-05-08 MED ORDER — SODIUM CHLORIDE 0.9 % IV SOLN
INTRAVENOUS | Status: DC
  Administered 2018-05-09: 07:00:00 via INTRAVENOUS

## 2018-05-08 MED ORDER — CEFAZOLIN SODIUM-DEXTROSE 2-4 GM/100ML-% IV SOLN
2.0000 g | INTRAVENOUS | Status: AC
  Administered 2018-05-09: 2 g via INTRAVENOUS
  Filled 2018-05-08: qty 100

## 2018-05-08 MED ORDER — KETOROLAC TROMETHAMINE 0.5 % OP SOLN: 1.0000 [drp] | Freq: Four times a day (QID) | OPHTHALMIC | Status: DC | PRN

## 2018-05-08 MED ORDER — SODIUM CHLORIDE 0.9 % IV SOLN
80.0000 mg | INTRAVENOUS | Status: AC
  Administered 2018-05-09: 80 mg
  Filled 2018-05-08: qty 2

## 2018-05-08 MED ORDER — SODIUM CHLORIDE 0.9 % IV SOLN: INTRAVENOUS | Status: DC

## 2018-05-08 MED ORDER — WARFARIN SODIUM 7.5 MG PO TABS
3.7500 mg | ORAL_TABLET | Freq: Once | ORAL | Status: AC
  Administered 2018-05-08: 3.75 mg via ORAL
  Filled 2018-05-08: qty 0.5

## 2018-05-08 MED ORDER — GATIFLOXACIN 0.5 % OP SOLN: 1.0000 [drp] | Freq: Four times a day (QID) | OPHTHALMIC | Status: DC | PRN

## 2018-05-08 MED ORDER — CHLORHEXIDINE GLUCONATE 4 % EX LIQD: 60.0000 mL | Freq: Once | CUTANEOUS | Status: DC

## 2018-05-08 MED ORDER — CEFAZOLIN SODIUM-DEXTROSE 2-4 GM/100ML-% IV SOLN: 2.0000 g | INTRAVENOUS | Status: DC

## 2018-05-08 MED ORDER — SODIUM CHLORIDE 0.9 % IV SOLN: 80.0000 mg | INTRAVENOUS | Status: DC

## 2018-05-08 MED ORDER — DIFLUPREDNATE 0.05 % OP EMUL: 1.0000 [drp] | Freq: Three times a day (TID) | OPHTHALMIC | Status: DC | PRN

## 2018-05-08 NOTE — Progress Notes (Signed)
Patient freely expresses her opinions on race using racially insensitive terms, pt was asked to discontinue use of the derogatory terms she was using.  Pt seems shocked that she was asked to do this, and asks RN "oh no don't tell me you're married to one" referring to an opposite race.   Patient informed that nurses marital status and race of s/o is not relevant to her treatment but that we have blended family all around [place] and would appreciate her discontinuing use of racially insensitive speech.  Pt denied other needs upon exiting the room.

## 2018-05-08 NOTE — Progress Notes (Addendum)
ANTICOAGULATION CONSULT NOTE - follow up  Pharmacy Consult:  warfarin Indication: atrial fibrillation  Allergies  Allergen Reactions  . Codeine Nausea Only    Patient Measurements: Height: 5' 5.5" (166.4 cm) Weight: 169 lb 6.4 oz (76.8 kg)(scale b) IBW/kg (Calculated) : 58.15  Vital Signs: Temp: 97.9 F (36.6 C) (06/09 0600) Temp Source: Oral (06/09 0600) BP: 108/68 (06/09 0953) Pulse Rate: 79 (06/09 0953)  Labs: Recent Labs    05/05/18 1208 05/06/18 0637 05/06/18 2051 05/07/18 0710  HGB 12.1  --   --   --   HCT 38.4  --   --   --   PLT 199  --   --   --   LABPROT  --  26.6*  --  26.1*  INR  --  2.48  --  2.42  CREATININE 1.31* 1.44* 1.63* 1.40*    Estimated Creatinine Clearance: 32.1 mL/min (A) (by C-G formula based on SCr of 1.4 mg/dL (H)).   Assessment: 30 YOF presented with SOB.  Pharmacy consulted to continue warfarin PTA medication for history of Afib.  Patient reported missing warfarin dose on 6/6. INR is therapeutic today at 2.42. Hemoglobin and platelets are stable. No noted concerns for bleeding at this time.   PTA warfarin dosing:  2.5mg  daily except 3.75mg  on WFSu  Goal of Therapy:  INR 2-3 Monitor platelets by anticoagulation protocol: Yes   Plan:  Warfarin 3.75mg  x 1 dose tonight  Daily CBC/ INR  Monitor for S/[place] of bleeding   Blake Divine, Pharm.D. PGY1 Pharmacy Resident 05/08/2018 10:02 AM Phone: [phone]

## 2018-05-08 NOTE — Progress Notes (Addendum)
Page to 5p-6a MD to request clarification of coumadin dosing due to work-in/likely pacer placement tomorrow. INR 2.3.  Currently no cardiology note from today to refer to.     Patient also mentions recollection of eliquis not being an option for her due to her seizure medication.

## 2018-05-08 NOTE — Progress Notes (Signed)
Patient resting comfortably during shift report. Denies complaints.  

## 2018-05-09 ENCOUNTER — Encounter (HOSPITAL_COMMUNITY): Payer: Self-pay | Admitting: Certified Registered"

## 2018-05-09 ENCOUNTER — Inpatient Hospital Stay (HOSPITAL_COMMUNITY)
Admission: EM | Disposition: A | Discharge status: Home Health | Payer: Self-pay | Source: Home / Self Care | Attending: Cardiovascular Disease

## 2018-05-09 DIAGNOSIS — I4891 Unspecified atrial fibrillation: Secondary | ICD-10-CM

## 2018-05-09 HISTORY — PX: PACEMAKER IMPLANT: EP1218

## 2018-05-09 LAB — CBC
HCT: 37.8 % (ref 36.0–46.0)
Hemoglobin: 12 g/dL (ref 12.0–15.0)
MCH: 31.3 pg (ref 26.0–34.0)
MCHC: 31.7 g/dL (ref 30.0–36.0)
MCV: 98.7 fL (ref 78.0–100.0)
Platelets: 178 10*3/uL (ref 150–400)
RBC: 3.83 MIL/uL — ABNORMAL LOW (ref 3.87–5.11)
RDW: 14.6 % (ref 11.5–15.5)
WBC: 6.2 10*3/uL (ref 4.0–10.5)

## 2018-05-09 LAB — BASIC METABOLIC PANEL
Anion gap: 8 (ref 5–15)
BUN: 20 mg/dL (ref 6–20)
CALCIUM: 8.5 mg/dL — AB (ref 8.9–10.3)
CHLORIDE: 111 mmol/L (ref 101–111)
CO2: 24 mmol/L (ref 22–32)
CREATININE: 1.31 mg/dL — AB (ref 0.44–1.00)
GFR calc Af Amer: 43 mL/min — ABNORMAL LOW (ref >60)
GFR calc non Af Amer: 37 mL/min — ABNORMAL LOW (ref >60)
GLUCOSE: 104 mg/dL — AB (ref 65–99)
Potassium: 4.6 mmol/L (ref 3.5–5.1)
Sodium: 143 mmol/L (ref 135–145)

## 2018-05-09 LAB — PROTIME-INR
INR: 2.12
Prothrombin Time: 23.6 seconds — ABNORMAL HIGH (ref 11.4–15.2)

## 2018-05-09 LAB — MAGNESIUM: MAGNESIUM: 2.2 mg/dL (ref 1.7–2.4)

## 2018-05-09 SURGERY — PACEMAKER IMPLANT
Anesthesia: Choice

## 2018-05-09 MED ORDER — MIDAZOLAM HCL 5 MG/5ML IJ SOLN
INTRAMUSCULAR | Status: DC | PRN
  Administered 2018-05-09 (×3): 1 mg via INTRAVENOUS

## 2018-05-09 MED ORDER — HEPARIN (PORCINE) IN NACL 2-0.9 UNITS/ML
INTRAMUSCULAR | Status: AC | PRN
  Administered 2018-05-09: 500 mL

## 2018-05-09 MED ORDER — MUPIROCIN 2 % EX OINT
1.0000 "application " | TOPICAL_OINTMENT | Freq: Two times a day (BID) | CUTANEOUS | Status: AC
  Administered 2018-05-09 – 2018-05-10 (×5): 1 via NASAL
  Filled 2018-05-09 (×2): qty 22

## 2018-05-09 MED ORDER — ACETAMINOPHEN 325 MG PO TABS
325.0000 mg | ORAL_TABLET | ORAL | Status: DC | PRN
  Filled 2018-05-09: qty 2

## 2018-05-09 MED ORDER — DILTIAZEM HCL ER COATED BEADS 180 MG PO CP24
180.0000 mg | ORAL_CAPSULE | Freq: Two times a day (BID) | ORAL | Status: DC
  Administered 2018-05-09 – 2018-05-11 (×4): 180 mg via ORAL
  Filled 2018-05-09 (×4): qty 1

## 2018-05-09 MED ORDER — FENTANYL CITRATE (PF) 100 MCG/2ML IJ SOLN
INTRAMUSCULAR | Status: DC | PRN
  Administered 2018-05-09 (×3): 12.5 ug via INTRAVENOUS

## 2018-05-09 MED ORDER — HEPARIN (PORCINE) IN NACL 1000-0.9 UT/500ML-% IV SOLN
INTRAVENOUS | Status: AC
  Filled 2018-05-09: qty 500

## 2018-05-09 MED ORDER — CEFAZOLIN SODIUM-DEXTROSE 1-4 GM/50ML-% IV SOLN
1.0000 g | Freq: Three times a day (TID) | INTRAVENOUS | Status: AC
  Administered 2018-05-09 – 2018-05-10 (×2): 1 g via INTRAVENOUS
  Filled 2018-05-09 (×2): qty 50

## 2018-05-09 MED ORDER — ONDANSETRON HCL 4 MG/2ML IJ SOLN: 4.0000 mg | Freq: Four times a day (QID) | INTRAMUSCULAR | Status: DC | PRN

## 2018-05-09 MED ORDER — SODIUM CHLORIDE 0.9 % IV SOLN: INTRAVENOUS | Status: AC

## 2018-05-09 MED ORDER — METOPROLOL TARTRATE 50 MG PO TABS
50.0000 mg | ORAL_TABLET | Freq: Two times a day (BID) | ORAL | Status: DC
  Administered 2018-05-09 – 2018-05-11 (×4): 50 mg via ORAL
  Filled 2018-05-09 (×4): qty 1

## 2018-05-09 MED ORDER — LIDOCAINE HCL 1 % IJ SOLN
INTRAMUSCULAR | Status: AC
  Filled 2018-05-09: qty 60

## 2018-05-09 MED ORDER — FUROSEMIDE 10 MG/ML IJ SOLN
40.0000 mg | Freq: Two times a day (BID) | INTRAMUSCULAR | Status: DC
  Administered 2018-05-09 – 2018-05-10 (×3): 40 mg via INTRAVENOUS
  Filled 2018-05-09 (×4): qty 4

## 2018-05-09 MED ORDER — SODIUM CHLORIDE 0.9 % IV SOLN
INTRAVENOUS | Status: AC
  Filled 2018-05-09: qty 2

## 2018-05-09 MED ORDER — MIDAZOLAM HCL 5 MG/5ML IJ SOLN
INTRAMUSCULAR | Status: AC
  Filled 2018-05-09: qty 5

## 2018-05-09 MED ORDER — CEFAZOLIN SODIUM-DEXTROSE 2-4 GM/100ML-% IV SOLN
INTRAVENOUS | Status: AC
  Filled 2018-05-09: qty 100

## 2018-05-09 MED ORDER — FENTANYL CITRATE (PF) 100 MCG/2ML IJ SOLN
INTRAMUSCULAR | Status: AC
  Filled 2018-05-09: qty 2

## 2018-05-09 MED ORDER — LIDOCAINE HCL (PF) 1 % IJ SOLN
INTRAMUSCULAR | Status: DC | PRN
  Administered 2018-05-09: 60 mL

## 2018-05-09 MED ORDER — WARFARIN SODIUM 3 MG PO TABS
3.0000 mg | ORAL_TABLET | Freq: Once | ORAL | Status: AC
  Administered 2018-05-09: 3 mg via ORAL
  Filled 2018-05-09: qty 1

## 2018-05-09 SURGICAL SUPPLY — 7 items
CABLE SURGICAL S-101-97-12 (CABLE) ×3 IMPLANT
HEMOSTAT SURGICEL 2X4 FIBR (HEMOSTASIS) ×6 IMPLANT
LEAD CAPSURE NOVUS 5076-58CM (Lead) ×3 IMPLANT
PACEMAKER ASSURITY SR-SF (Pacemaker) ×3 IMPLANT
PAD DEFIB LIFELINK (PAD) ×3 IMPLANT
SHEATH CLASSIC 7F (SHEATH) ×3 IMPLANT
TRAY PACEMAKER INSERTION (PACKS) ×3 IMPLANT

## 2018-05-09 NOTE — Progress Notes (Signed)
ANTICOAGULATION CONSULT NOTE - follow up  Pharmacy Consult:  warfarin Indication: atrial fibrillation  Allergies  Allergen Reactions  . Codeine Nausea Only    Patient Measurements: Height: 5' 5.5" (166.4 cm) Weight: 169 lb 12.8 oz (77 kg)(scale b) IBW/kg (Calculated) : 58.15  Vital Signs: Temp: 97.6 F (36.4 C) (06/10 0607) Temp Source: Oral (06/10 0607) BP: 135/91 (06/10 0834) Pulse Rate: 98 (06/10 0834)  Labs: Recent Labs    05/07/18 0710 05/08/18 0828 05/09/18 0542  HGB  --   --  12.0  HCT  --   --  37.8  PLT  --   --  178  LABPROT 26.1* 25.1* 23.6*  INR 2.42 2.30 2.12  CREATININE 1.40* 1.24* 1.31*   Estimated Creatinine Clearance: 34.3 mL/min (A) (by C-G formula based on SCr of 1.31 mg/dL (H)).  Assessment: 14 YOF presented with SOB.  Pharmacy consulted to continue warfarin PTA medication for history of Afib.  Patient reported missing warfarin dose on 6/6. INR is therapeutic today at 2.12. Hemoglobin and platelets are stable. No overt bleeding documented.   PTA warfarin dosing:  2.5mg  daily except 3.75mg  on WFSu  Goal of Therapy:  INR 2-3 Monitor platelets by anticoagulation protocol: Yes   Plan:  Warfarin 3mg  x 1 dose tonight  Daily CBC/ INR  Monitor for S/[place] of bleeding   Ruben Im, PharmD Clinical Pharmacist 05/09/2018 8:46 AM

## 2018-05-09 NOTE — Progress Notes (Signed)
Progress Note  Patient Name: Karen Richardson Date of Encounter: 05/09/2018  Primary Cardiologist: Prentice Docker, MD  Electrophysiology: new this admission, Dr. Ladona Ridgel  Subjective   No CP, palpitations or SOB  Inpatient Medications    Scheduled Meds: . atorvastatin  10 mg Oral QPM  . carbamazepine  100 mg Oral BID  . diltiazem  240 mg Oral Daily  . gentamicin irrigation  80 mg Irrigation To SS-Surg  . metoprolol tartrate  25 mg Oral BID  . mupirocin ointment  1 application Nasal BID  . Netarsudil Dimesylate  1 drop Left Eye QHS  . potassium chloride  10 mEq Oral Daily  . rOPINIRole  0.25 mg Oral QHS  . sodium chloride flush  3 mL Intravenous Q12H  . sodium chloride flush  3 mL Intravenous Q12H  . Warfarin - Pharmacist Dosing Inpatient   Does not apply q1800   Continuous Infusions: . sodium chloride    . sodium chloride    . sodium chloride 50 mL/hr at 05/09/18 0643  . sodium chloride 50 mL/hr at 05/09/18 0634  .  ceFAZolin (ANCEF) IV     PRN Meds: sodium chloride, sodium chloride, acetaminophen, ondansetron (ZOFRAN) IV, sodium chloride flush, sodium chloride flush   Vital Signs    Vitals:   05/08/18 1651 05/08/18 1750 05/08/18 1947 05/09/18 0607  BP: (!) 146/81 122/81 112/66 (!) 121/91  Pulse: 63 76 86 95  Resp: 20 20 18 18   Temp: 98.7 F (37.1 C) 97.8 F (36.6 C) 98 F (36.7 C) 97.6 F (36.4 C)  TempSrc: Oral Oral Oral Oral  SpO2: 98% 98% 95% 96%  Weight:    169 lb 12.8 oz (77 kg)  Height:        Intake/Output Summary (Last 24 hours) at 05/09/2018 0723 Last data filed at 05/09/2018 7034 Gross per 24 hour  Intake 600 ml  Output 1601 ml  Net -1001 ml   Filed Weights   05/07/18 0354 05/08/18 0600 05/09/18 0607  Weight: 168 lb 14.4 oz (76.6 kg) 169 lb 6.4 oz (76.8 kg) 169 lb 12.8 oz (77 kg)    Telemetry    AFib 80's-90;s, NSVT x3 - Personally Reviewed  ECG    AFib 79bpm - Personally Reviewed  Physical Exam   GEN: No acute distress.   Neck:  No JVD Cardiac: iRRR, no murmurs, rubs, or gallops.  Respiratory: CTA b/l. GI: Soft, nontender, non-distended  MS: No edema; No deformity. Neuro:  Nonfocal  Psych: Normal affect  L chest, implant site is stable, no hematoma  Labs    Chemistry Recent Labs  Lab 05/04/18 1209  05/07/18 0710 05/08/18 0828 05/09/18 0542  NA 142   < > 141 138 143  K 4.9   < > 4.3 4.2 4.6  CL 109   < > 110 110 111  CO2 24   < > 24 20* 24  GLUCOSE 92   < > 100* 91 104*  BUN 22   < > 21* 20 20  CREATININE 1.32*   < > 1.40* 1.24* 1.31*  CALCIUM 9.2   < > 8.4* 8.4* 8.5*  PROT 6.4  --   --   --   --   AST 57*  --   --   --   --   ALT 74*  --   --   --   --   BILITOT 0.5  --   --   --   --   [facility]  37*   < > 34* 39* 37*  GFRAA 43*   < > 39* 46* 43*  ANIONGAP  --    < > 7 8 8    < > = values in this interval not displayed.     Hematology Recent Labs  Lab 05/05/18 1208 05/09/18 0542  WBC 5.4 6.2  RBC 3.94 3.83*  HGB 12.1 12.0  HCT 38.4 37.8  MCV 97.5 98.7  MCH 30.7 31.3  MCHC 31.5 31.7  RDW 14.6 14.6  PLT 199 178    Cardiac EnzymesNo results for input(s): TROPONINI in the last 168 hours. No results for input(s): TROPIPOC in the last 168 hours.   BNPNo results for input(s): BNP, PROBNP in the last 168 hours.   DDimer No results for input(s): DDIMER in the last 168 hours.   Radiology    No results found.  Cardiac Studies   05/06/18: TEE LA, LAA, RA without masses AV mildly thickened, calcified   Trace AI MV normal   mIld MR TV normal   Moderate to severe TR RA is dilated  PV is normal   Mild PI DIfficult to assess LVE with angle of heart Mild fixed plaquing of thoracic aorta NO PFO as tested by color doppler only.  02/22/17: TTE Study Conclusions - Left ventricle: The cavity size was normal. Wall thickness was   increased in a pattern of mild LVH. Systolic function was normal.   The estimated ejection fraction was in the range of 55% to 60%.   The study is not technically  sufficient to allow evaluation of LV   diastolic function. - Aortic valve: Mildly calcified annulus. Trileaflet; mildly   thickened leaflets. There was mild regurgitation. Valve area   (VTI): 1.93 cm^2. Valve area (Vmax): 2.2 cm^2. - Mitral valve: There was mild regurgitation. - Left atrium: The atrium was severely dilated. 36mm - Right ventricle: The cavity size was mildly to moderately   dilated. Systolic function was mildly reduced. - Right atrium: The atrium was severely dilated. - Tricuspid valve: There was moderate regurgitation. - Pulmonary arteries: Systolic pressure was mildly increased. PA   peak pressure: 37 mm Hg (S). - Technically difficult study.  Patient Profile     84 y.o. female with PMHx of HTN, HLD, CHF (diastolic), CKD (III), persistent AFib, hx of recurrent PE, seizure d/o, CVA, admitted with CHF exacerbation and planned for Tikosyn initation.  Assessment & Plan    1. Persistent AFib, RVR/difficult to control rate     CHA2DS2Vasc is 6, on warfarin     Hx of failed amiodarone 2/2 tremor and skin discoloration     Failed Tikosyn here with QT prolongation     S/p PPM implant yesterday     Dr.Klein has seen and examined the patient, reviewed CXR     Stable lead position, no mention of ptx on read     Device check this AM with intact function     Wound care and activity restrictions were discussed with the patient     Rate is improved this AM, planned for AV node ablation out patient (Dr. Odessa Fleming RN will arrange with patient)         INR today is 2.54, given hx of CVA, recurrent PE, keep INR 2-3  Given NSVT episodes, will monitor today, anticipate discharge tomorrow  2. Acute/chronic CHF (disatolic)     cumulatively fluid neg -     Creat stable, 1.18 today, looks at or better then baseline  Down 3 lbs      K+ 3.7           3. HTN     Looks OK, no changes today             For questions or updates, please contact [facility] Please consult  [email or web address] for contact info under Cardiology/STEMI.      Signed, Sheilah Pigeon, PA-C  05/09/2018, 7:23 AM

## 2018-05-09 NOTE — Progress Notes (Signed)
Progress Note  Patient Name: Karen Richardson Date of Encounter: 05/09/2018  Primary Cardiologist: SKo Primary Electrophysiologist: GT   Patient Profile     84 y.o. female recurrent atrial fibrillation with rapid rate.  Previously treated with amiodarone was discontinued because of neuropathy and skin discoloration.  Admitted TEE guided Tikosyn initiation. Unfortunately, QT intervals are prolonged markedly on her low dose of dofetilide from his discontinuation. echocardiogram demonstrates normal LV function severe biatrial enlargement (left atrial volume 43) No known coronary artery disease  Subjective   Without sob or chest pain  Slept well  Inpatient Medications    Scheduled Meds: . atorvastatin  10 mg Oral QPM  . carbamazepine  100 mg Oral BID  . diltiazem  240 mg Oral Daily  . gentamicin irrigation  80 mg Irrigation To SS-Surg  . metoprolol tartrate  25 mg Oral BID  . mupirocin ointment  1 application Nasal BID  . Netarsudil Dimesylate  1 drop Left Eye QHS  . potassium chloride  10 mEq Oral Daily  . rOPINIRole  0.25 mg Oral QHS  . sodium chloride flush  3 mL Intravenous Q12H  . sodium chloride flush  3 mL Intravenous Q12H  . Warfarin - Pharmacist Dosing Inpatient   Does not apply q1800   Continuous Infusions: . sodium chloride    . sodium chloride    . sodium chloride 50 mL/hr at 05/09/18 0643  . sodium chloride 50 mL/hr at 05/09/18 0634  .  ceFAZolin (ANCEF) IV     PRN Meds: sodium chloride, sodium chloride, acetaminophen, ondansetron (ZOFRAN) IV, sodium chloride flush, sodium chloride flush   Vital Signs    Vitals:   05/08/18 1651 05/08/18 1750 05/08/18 1947 05/09/18 0607  BP: (!) 146/81 122/81 112/66 (!) 121/91  Pulse: 63 76 86 95  Resp: 20 20 18 18   Temp: 98.7 F (37.1 C) 97.8 F (36.6 C) 98 F (36.7 C) 97.6 F (36.4 C)  TempSrc: Oral Oral Oral Oral  SpO2: 98% 98% 95% 96%  Weight:    169 lb 12.8 oz (77 kg)  Height:        Intake/Output Summary  (Last 24 hours) at 05/09/2018 0834 Last data filed at 05/09/2018 1610 Gross per 24 hour  Intake 600 ml  Output 1601 ml  Net -1001 ml   Filed Weights   05/07/18 0354 05/08/18 0600 05/09/18 0607  Weight: 168 lb 14.4 oz (76.6 kg) 169 lb 6.4 oz (76.8 kg) 169 lb 12.8 oz (77 kg)    Telemetry    afib rvr - Personally Reviewed  ECG       Physical Exam   Well developed and nourished in no acute distress HENT normal Neck supple   Clear Irregularly irregular rate and rhythm with rapid ventricular response, no murmurs or gallops Abd-soft with active BS without hepatomegaly No Clubbing cyanosis edema Skin-warm and dry A & Oriented  Grossly normal sensory and motor function   Labs    Chemistry Recent Labs  Lab 05/04/18 1209  05/07/18 0710 05/08/18 0828 05/09/18 0542  NA 142   < > 141 138 143  K 4.9   < > 4.3 4.2 4.6  CL 109   < > 110 110 111  CO2 24   < > 24 20* 24  GLUCOSE 92   < > 100* 91 104*  BUN 22   < > 21* 20 20  CREATININE 1.32*   < > 1.40* 1.24* 1.31*  CALCIUM 9.2   < >  8.4* 8.4* 8.5*  PROT 6.4  --   --   --   --   AST 57*  --   --   --   --   ALT 74*  --   --   --   --   BILITOT 0.5  --   --   --   --   GFRNONAA 37*   < > 34* 39* 37*  GFRAA 43*   < > 39* 46* 43*  ANIONGAP  --    < > 7 8 8    < > = values in this interval not displayed.     Hematology Recent Labs  Lab 05/05/18 1208 05/09/18 0542  WBC 5.4 6.2  RBC 3.94 3.83*  HGB 12.1 12.0  HCT 38.4 37.8  MCV 97.5 98.7  MCH 30.7 31.3  MCHC 31.5 31.7  RDW 14.6 14.6  PLT 199 178    Cardiac EnzymesNo results for input(s): TROPONINI in the last 168 hours. No results for input(s): TROPIPOC in the last 168 hours.   BNPNo results for input(s): BNP, PROBNP in the last 168 hours.   DDimer No results for input(s): DDIMER in the last 168 hours.   Radiology    No results found.  Cardiac Studies   TEE demonstrated no atrial clot Device Interrogation      Assessment & Plan    Atrial  fibrillation-persistent with rapid ventricular response  Intolerance of amiodarone and dofetilide  LV function-normal  Biatrial enlargement  Renal insufficiency grade 3 acute on chronic   Proceed with pacing and subsequent AV ablation  Reviewed with pt  The benefits and risks were reviewed including but not limited to death,  perforation, infection, lead dislodgement and device malfunction.  The patient understands agrees and is willing to proceed.   Signed, Sherryl Manges, MD  05/09/2018, 8:34 AM

## 2018-05-09 NOTE — Progress Notes (Signed)
Pt left for surgery at 16:10pm, tele took off and informed to CCMD

## 2018-05-09 NOTE — Progress Notes (Signed)
Report provided to the night shift RN for continuation of care, vitals q. 15 min * 4  Times done, others in progress, pt is stable, denies pain, operation site CDI, daughter is in bed side and is updating  Lonia Farber, Charity fundraiser

## 2018-05-09 NOTE — Interval H&P Note (Signed)
History and Physical Interval Note:  05/09/2018 8:36 AM  Karen Richardson  has presented today for surgery, with the diagnosis of tachybrady syndrome  The various methods of treatment have been discussed with the patient and family. After consideration of risks, benefits and other options for treatment, the patient has consented to  Procedure(s): PACEMAKER IMPLANT (N/A) as a surgical intervention .  The patient's history has been reviewed, patient examined, no change in status, stable for surgery.  I have reviewed the patient's chart and labs.  Questions were answered to the patient's satisfaction.     Sherryl Manges

## 2018-05-09 NOTE — Progress Notes (Signed)
Pt received from OR at 5.50pm, operation site is CDI, pt has a sling in her left arm, vitals stable, denies pain, ordered her dinner, family members in bed side and is updating, will continue to monitor  Lonia Farber, RN

## 2018-05-09 NOTE — H&P (View-Only) (Signed)
 Progress Note  Patient Name: Karen Richardson Date of Encounter: 05/09/2018  Primary Cardiologist: SKo Primary Electrophysiologist: GT   Patient Profile     84 y.o. female recurrent atrial fibrillation with rapid rate.  Previously treated with amiodarone was discontinued because of neuropathy and skin discoloration.  Admitted TEE guided Tikosyn initiation. Unfortunately, QT intervals are prolonged markedly on her low dose of dofetilide from his discontinuation. echocardiogram demonstrates normal LV function severe biatrial enlargement (left atrial volume 43) No known coronary artery disease  Subjective   Without sob or chest pain  Slept well  Inpatient Medications    Scheduled Meds: . atorvastatin  10 mg Oral QPM  . carbamazepine  100 mg Oral BID  . diltiazem  240 mg Oral Daily  . gentamicin irrigation  80 mg Irrigation To SS-Surg  . metoprolol tartrate  25 mg Oral BID  . mupirocin ointment  1 application Nasal BID  . Netarsudil Dimesylate  1 drop Left Eye QHS  . potassium chloride  10 mEq Oral Daily  . rOPINIRole  0.25 mg Oral QHS  . sodium chloride flush  3 mL Intravenous Q12H  . sodium chloride flush  3 mL Intravenous Q12H  . Warfarin - Pharmacist Dosing Inpatient   Does not apply q1800   Continuous Infusions: . sodium chloride    . sodium chloride    . sodium chloride 50 mL/hr at 05/09/18 0643  . sodium chloride 50 mL/hr at 05/09/18 0634  .  ceFAZolin (ANCEF) IV     PRN Meds: sodium chloride, sodium chloride, acetaminophen, ondansetron (ZOFRAN) IV, sodium chloride flush, sodium chloride flush   Vital Signs    Vitals:   05/08/18 1651 05/08/18 1750 05/08/18 1947 05/09/18 0607  BP: (!) 146/81 122/81 112/66 (!) 121/91  Pulse: 63 76 86 95  Resp: 20 20 18 18  Temp: 98.7 F (37.1 C) 97.8 F (36.6 C) 98 F (36.7 C) 97.6 F (36.4 C)  TempSrc: Oral Oral Oral Oral  SpO2: 98% 98% 95% 96%  Weight:    169 lb 12.8 oz (77 kg)  Height:        Intake/Output Summary  (Last 24 hours) at 05/09/2018 0834 Last data filed at 05/09/2018 0609 Gross per 24 hour  Intake 600 ml  Output 1601 ml  Net -1001 ml   Filed Weights   05/07/18 0354 05/08/18 0600 05/09/18 0607  Weight: 168 lb 14.4 oz (76.6 kg) 169 lb 6.4 oz (76.8 kg) 169 lb 12.8 oz (77 kg)    Telemetry    afib rvr - Personally Reviewed  ECG       Physical Exam   Well developed and nourished in no acute distress HENT normal Neck supple   Clear Irregularly irregular rate and rhythm with rapid ventricular response, no murmurs or gallops Abd-soft with active BS without hepatomegaly No Clubbing cyanosis edema Skin-warm and dry A & Oriented  Grossly normal sensory and motor function   Labs    Chemistry Recent Labs  Lab 05/04/18 1209  05/07/18 0710 05/08/18 0828 05/09/18 0542  NA 142   < > 141 138 143  K 4.9   < > 4.3 4.2 4.6  CL 109   < > 110 110 111  CO2 24   < > 24 20* 24  GLUCOSE 92   < > 100* 91 104*  BUN 22   < > 21* 20 20  CREATININE 1.32*   < > 1.40* 1.24* 1.31*  CALCIUM 9.2   < >   8.4* 8.4* 8.5*  PROT 6.4  --   --   --   --   AST 57*  --   --   --   --   ALT 74*  --   --   --   --   BILITOT 0.5  --   --   --   --   GFRNONAA 37*   < > 34* 39* 37*  GFRAA 43*   < > 39* 46* 43*  ANIONGAP  --    < > 7 8 8   < > = values in this interval not displayed.     Hematology Recent Labs  Lab 05/05/18 1208 05/09/18 0542  WBC 5.4 6.2  RBC 3.94 3.83*  HGB 12.1 12.0  HCT 38.4 37.8  MCV 97.5 98.7  MCH 30.7 31.3  MCHC 31.5 31.7  RDW 14.6 14.6  PLT 199 178    Cardiac EnzymesNo results for input(s): TROPONINI in the last 168 hours. No results for input(s): TROPIPOC in the last 168 hours.   BNPNo results for input(s): BNP, PROBNP in the last 168 hours.   DDimer No results for input(s): DDIMER in the last 168 hours.   Radiology    No results found.  Cardiac Studies   TEE demonstrated no atrial clot Device Interrogation      Assessment & Plan    Atrial  fibrillation-persistent with rapid ventricular response  Intolerance of amiodarone and dofetilide  LV function-normal  Biatrial enlargement  Renal insufficiency grade 3 acute on chronic   Proceed with pacing and subsequent AV ablation  Reviewed with pt  The benefits and risks were reviewed including but not limited to death,  perforation, infection, lead dislodgement and device malfunction.  The patient understands agrees and is willing to proceed.   Signed, Steven Klein, MD  05/09/2018, 8:34 AM    

## 2018-05-10 ENCOUNTER — Encounter: Payer: Self-pay | Admitting: Cardiology

## 2018-05-10 ENCOUNTER — Encounter (HOSPITAL_COMMUNITY): Payer: Self-pay | Admitting: Internal Medicine

## 2018-05-10 ENCOUNTER — Inpatient Hospital Stay (HOSPITAL_COMMUNITY): Payer: Medicare HMO

## 2018-05-10 ENCOUNTER — Ambulatory Visit (HOSPITAL_COMMUNITY): Payer: Medicare HMO | Admitting: Nurse Practitioner

## 2018-05-10 LAB — CBC
HEMATOCRIT: 34.2 % — AB (ref 36.0–46.0)
HEMOGLOBIN: 11 g/dL — AB (ref 12.0–15.0)
MCH: 30.9 pg (ref 26.0–34.0)
MCHC: 32.2 g/dL (ref 30.0–36.0)
MCV: 96.1 fL (ref 78.0–100.0)
Platelets: 185 10*3/uL (ref 150–400)
RBC: 3.56 MIL/uL — AB (ref 3.87–5.11)
RDW: 14.4 % (ref 11.5–15.5)
WBC: 5.6 10*3/uL (ref 4.0–10.5)

## 2018-05-10 LAB — BASIC METABOLIC PANEL
Anion gap: 10 (ref 5–15)
BUN: 15 mg/dL (ref 6–20)
CHLORIDE: 108 mmol/L (ref 101–111)
CO2: 23 mmol/L (ref 22–32)
Calcium: 8.5 mg/dL — ABNORMAL LOW (ref 8.9–10.3)
Creatinine, Ser: 1.18 mg/dL — ABNORMAL HIGH (ref 0.44–1.00)
GFR calc non Af Amer: 42 mL/min — ABNORMAL LOW (ref >60)
GFR, EST AFRICAN AMERICAN: 48 mL/min — AB (ref >60)
Glucose, Bld: 111 mg/dL — ABNORMAL HIGH (ref 65–99)
Potassium: 3.7 mmol/L (ref 3.5–5.1)
Sodium: 141 mmol/L (ref 135–145)

## 2018-05-10 LAB — PROTIME-INR
INR: 2.54
Prothrombin Time: 27.2 seconds — ABNORMAL HIGH (ref 11.4–15.2)

## 2018-05-10 MED ORDER — WARFARIN SODIUM 2.5 MG PO TABS
2.5000 mg | ORAL_TABLET | Freq: Once | ORAL | Status: AC
  Administered 2018-05-10: 2.5 mg via ORAL
  Filled 2018-05-10: qty 1

## 2018-05-10 NOTE — Progress Notes (Signed)
Spoke with pt's daughter regarding AV node ablation and pre procedure instructions. Pt's daughter, Karen Richardson, will call with additional questions.

## 2018-05-10 NOTE — Progress Notes (Signed)
Per pt request, update provided to pt daughter via the phone  Informed of possible discharge tomorrow 6/12 per cardiology

## 2018-05-10 NOTE — Telephone Encounter (Signed)
Needing a refill on her Lasix sent to [facility]

## 2018-05-10 NOTE — Discharge Summary (Signed)
ELECTROPHYSIOLOGY PROCEDURE DISCHARGE SUMMARY    Patient ID: Karen Richardson,  MRN: [id number], DOB/AGE: July 05, 1934 84 y.o.  Admit date: 05/05/2018 Discharge date: 05/11/18  Primary Care Physician: Aliene Beams, MD  Primary Cardiologist: Dr. Purvis Sheffield Electrophysiologist: Dr. Ladona Ridgel  Primary Discharge Diagnosis:  1. Persistent AFib     CHA2DS2Vasc is 6, on Warfarin 2. Acute/Chronic CHF  Secondary Discharge Diagnosis:  1. CKD (III) 2. HTN  3. Seizure d/o 4. Hx of CVA  Allergies  Allergen Reactions  . Codeine Nausea Only     Procedures This Admission:  1. 05/06/18 TEE, Dr. Tenny Craw LA, LAA, RA without masses AV mildly thickened, calcified   Trace AI MV normal   mIld MR TV normal   Moderate to severe TR RA is dilated  PV is normal   Mild PI DIfficult to assess LVE with angle of heart Mild fixed plaquing of thoracic aorta NO PFO as tested by color doppler only.   2.  Implantation of a SJM single chamber PPM on 05/09/18 by Dr Graciela Husbands.  The patient received a St Jude pulse generator serial number [id number]., Medtronic MRI compatible 5076 ventricular lead serial [id number]  There were no immediate post procedure complications. 3.  CXR on 05/10/18 demonstrated no pneumothorax status post device implantation.   Brief HPI: Karen Richardson is a 84 y.o. female was admitted to [facility] with increased fatigue and SOB, persistent AFib who was planned to to be admitted electively for Tikosyn protocol though came with acute CHF exacerbation.  Hospital Course:  The patient was admitted diuresed,and underwent TEE followed by Tikosyn initiation.  Her QT became markedly prolonged after her 1st low dose given and was not felt to be a candidate for the drug.  She had failed historically amiodarone and low likelihood of a 1c agent holding SR given bi-atrial enlargement was recommended PPM implant with eventual AV node ablation.  She underwent implantation of a PPM with details as outlined above.  She  was monitored on telemetry overnight which demonstrated AFib with rates 90's, and 3 episodes of NSVT.  Given this was held an additional 24 hours to monitor her telemetry.  She had no recurrent NVST, her AFib rates much improved 80's-90's.  Left chest was without hematoma or ecchymosis.  The device was interrogated post implant day 1 and found to be functioning normally.  CXR was obtained and demonstrated no pneumothorax status post device implantation.  Wound care, arm mobility, and restrictions were reviewed with the patient.  The patient feels very well the day of discharge, no CP or SOB, was examined by Dr. Graciela Husbands and considered stable for discharge to home.  Her HR has been fairly well controlled in the last couple days, early follow up will be arranged to evaluate HR and need or not for AV node ablation.  In review, she was taking potassium replacement at home, she tells me she has been for years, will continue unchanged, she was not hyperkalemic on arrival.     Physical Exam: Vitals:   05/10/18 1605 05/10/18 2037 05/11/18 0546 05/11/18 1040  BP: 108/70 125/83 119/79 125/68  Pulse: 66 90 93 85  Resp: 17 17    Temp: 97.9 F (36.6 C) (!) 97.5 F (36.4 C) 98 F (36.7 C)   TempSrc: Oral Oral    SpO2: 97% 99% 95%   Weight:   165 lb 11.2 oz (75.2 kg)   Height:        GEN- The patient is well  appearing, alert and oriented x 3 today.   HEENT: normocephalic, atraumatic; sclera clear, conjunctiva pink; hearing intact; oropharynx clear; neck supple, no JVP Lungs- CTA b/l, normal work of breathing.  No wheezes, rales, rhonchi Heart- iRRR , no murmurs, rubs or gallops GI- soft, non-tender, non-distended Extremities- no clubbing, cyanosis, or edema MS- no significant deformity or atrophy Skin- warm and dry, no rash or lesion, left chest without hematoma/ecchymosis Psych- euthymic mood, full affect Neuro- no gross deficits   Labs:   Lab Results  Component Value Date   WBC 5.7 05/11/2018    HGB 11.4 (L) 05/11/2018   HCT 35.7 (L) 05/11/2018   MCV 97.0 05/11/2018   PLT 151 05/11/2018    Recent Labs  Lab 05/04/18 1209  05/11/18 0629  NA 142   < > 143  K 4.9   < > 3.7  CL 109   < > 109  CO2 24   < > 24  BUN 22   < > 16  CREATININE 1.32*   < > 1.27*  CALCIUM 9.2   < > 8.4*  PROT 6.4  --   --   BILITOT 0.5  --   --   ALT 74*  --   --   AST 57*  --   --   GLUCOSE 92   < > 100*   < > = values in this interval not displayed.    Discharge Medications:  Allergies as of 05/11/2018      Reactions   Codeine Nausea Only      Medication List    STOP taking these medications   diltiazem 240 MG 24 hr capsule Commonly known as:  TIAZAC Replaced by:  diltiazem 180 MG 24 hr capsule     TAKE these medications   atorvastatin 10 MG tablet Commonly known as:  LIPITOR TAKE ONE TABLET BY MOUTH EVERY EVENING.   carbamazepine 100 MG 12 hr tablet Commonly known as:  TEGRETOL XR Take 1 tablet by mouth twice daily (morning, bedtime)   diltiazem 180 MG 24 hr capsule Commonly known as:  CARDIZEM CD Take 1 capsule (180 mg total) by mouth 2 (two) times daily. Replaces:  diltiazem 240 MG 24 hr capsule   DUREZOL 0.05 % Emul Generic drug:  Difluprednate Place 1 drop into the left eye 3 (three) times daily.   ketorolac 0.5 % ophthalmic solution Commonly known as:  ACULAR Place 1 drop into the left eye 4 (four) times daily.   metoprolol tartrate 50 MG tablet Commonly known as:  LOPRESSOR Take 1 tablet (50 mg total) by mouth 2 (two) times daily. What changed:    medication strength  how much to take   moxifloxacin 0.5 % ophthalmic solution Commonly known as:  VIGAMOX Place 1 drop into the left eye 4 (four) times daily.   potassium chloride 10 MEQ tablet Commonly known as:  K-DUR,KLOR-CON TAKE ONE TABLET BY MOUTH TWICE DAILY. (MORNING ,BEDTIME)   RHOPRESSA 0.02 % Soln Generic drug:  Netarsudil Dimesylate Place 1 drop into the left eye at bedtime.   Vitamin D  (Ergocalciferol) 50000 units Caps capsule Commonly known as:  DRISDOL TAKE ONE CAPSULE BY MOUTH EVERY 7 DAYS (FRIDAY MORNING)   warfarin 2.5 MG tablet Commonly known as:  COUMADIN Take as directed. If you are unsure how to take this medication, talk to your nurse or doctor. Original instructions:  TAKE AS DIRECTED BY THE ANTICOAGULATION CLINIC What changed:  See the new instructions. Notes to patient:  Continue your current home regime unchanged            Durable Medical Equipment  (From admission, onward)        Start     Ordered   05/11/18 1019  For home use only DME Walker rolling  Once    Comments:  Rollater  Question:  Patient needs a walker to treat with the following condition  Answer:  CHF (congestive heart failure) (HCC)   05/11/18 1019      Disposition:  Discharge Instructions    Diet - low sodium heart healthy   Complete by:  As directed    Increase activity slowly   Complete by:  As directed      Follow-up Information    [facility] Follow up on 06/22/2018.   Specialty:  Cardiology Why:  2:30PM, wound check visit Contact information: [address] [place] [address] [phone]       Duke Salvia, MD Follow up on 06/01/2018.   Specialty:  Cardiology Why:  10:30AM Contact information: [address] [place] [address] [phone]        [facility] Follow up on 05/19/2018.   Specialty:  Cardiology Why:  9:00AM, coumadinc clinic/lab Contact information: [address] [place] [address] [phone]       [facility] - Dme Follow up.   Why:  They will do your home health care at your home Contact information: [address] [place] [address] [phone]           Duration of Discharge Encounter: Greater than 30 minutes including physician time.  Norma Fredrickson,  PA-C 05/11/2018 11:17 AM

## 2018-05-10 NOTE — Telephone Encounter (Signed)
ERROR: Message was sent to triage on wrong patient

## 2018-05-10 NOTE — Progress Notes (Signed)
ANTICOAGULATION CONSULT NOTE - follow up  Pharmacy Consult:  warfarin Indication: atrial fibrillation  Allergies  Allergen Reactions  . Codeine Nausea Only    Patient Measurements: Height: 5' 5.5" (166.4 cm) Weight: 167 lb 14.4 oz (76.2 kg) IBW/kg (Calculated) : 58.15  Vital Signs: Temp: 98.1 F (36.7 C) (06/11 0819) Temp Source: Oral (06/11 0819) BP: 128/91 (06/11 0819) Pulse Rate: 91 (06/11 0819)  Labs: Recent Labs    05/08/18 0828 05/09/18 0542 05/10/18 0656  HGB  --  12.0 11.0*  HCT  --  37.8 34.2*  PLT  --  178 185  LABPROT 25.1* 23.6* 27.2*  INR 2.30 2.12 2.54  CREATININE 1.24* 1.31* 1.18*   Estimated Creatinine Clearance: 37.9 mL/min (A) (by C-G formula based on SCr of 1.18 mg/dL (H)).  Assessment: 58 YOF presented with SOB.  Pharmacy consulted to continue warfarin PTA medication for history of Afib.  Patient reported missing warfarin dose on 6/6. INR is therapeutic today at 2.54. Hemoglobin 11.0 down slightly and platelets are stable. No overt bleeding documented.   PTA warfarin dosing:  2.5mg  daily except 3.75mg  on WFSu  Goal of Therapy:  INR 2-3 Monitor platelets by anticoagulation protocol: Yes   Plan:  Warfarin 2.5mg  x 1 dose tonight  Daily CBC/ INR  Monitor for [place] of bleeding   Ruben Im, PharmD Clinical Pharmacist 05/10/2018 8:43 AM

## 2018-05-10 NOTE — Telephone Encounter (Signed)
I don't see that pt is on Lasix, can you clarify

## 2018-05-10 NOTE — Plan of Care (Signed)
  Problem: Skin Integrity: Goal: Risk for impaired skin integrity will decrease Outcome: Progressing   Problem: Cardiac: Goal: Ability to achieve and maintain adequate cardiopulmonary perfusion will improve Outcome: Progressing   Problem: Activity: Goal: Capacity to carry out activities will improve Outcome: Progressing

## 2018-05-10 NOTE — Progress Notes (Signed)
Dr Ellie Lunch made aware of 12 beats of vtach. Pt asymptomatic. Continued to observe pt closely

## 2018-05-10 NOTE — Telephone Encounter (Signed)
   Lasix was not listed as a current medication during her office visit in 12/2017. However, the patient is currently admitted to [facility] by review of records. Would await medication reconciliation instructions at the time of discharge prior to sending additional refills.   Signed, Ellsworth Lennox, PA-C 05/10/2018, 10:54 AM Pager: [phone]

## 2018-05-10 NOTE — Progress Notes (Signed)
Pt daughter at bedside expressing concern for pt pacer. Pt daughter states she should be informed because her father forgets. Called st judes representative, spoke to [place]. She stated she can give explanation via the phone. Provided the phone to pt daughter at bedside.

## 2018-05-11 ENCOUNTER — Telehealth: Payer: Self-pay

## 2018-05-11 LAB — CBC
HEMATOCRIT: 35.7 % — AB (ref 36.0–46.0)
Hemoglobin: 11.4 g/dL — ABNORMAL LOW (ref 12.0–15.0)
MCH: 31 pg (ref 26.0–34.0)
MCHC: 31.9 g/dL (ref 30.0–36.0)
MCV: 97 fL (ref 78.0–100.0)
Platelets: 151 10*3/uL (ref 150–400)
RBC: 3.68 MIL/uL — ABNORMAL LOW (ref 3.87–5.11)
RDW: 14.6 % (ref 11.5–15.5)
WBC: 5.7 10*3/uL (ref 4.0–10.5)

## 2018-05-11 LAB — PROTIME-INR
INR: 2.54
Prothrombin Time: 27.1 seconds — ABNORMAL HIGH (ref 11.4–15.2)

## 2018-05-11 LAB — BASIC METABOLIC PANEL
ANION GAP: 10 (ref 5–15)
BUN: 16 mg/dL (ref 6–20)
CO2: 24 mmol/L (ref 22–32)
Calcium: 8.4 mg/dL — ABNORMAL LOW (ref 8.9–10.3)
Chloride: 109 mmol/L (ref 101–111)
Creatinine, Ser: 1.27 mg/dL — ABNORMAL HIGH (ref 0.44–1.00)
GFR calc Af Amer: 44 mL/min — ABNORMAL LOW (ref >60)
GFR, EST NON AFRICAN AMERICAN: 38 mL/min — AB (ref >60)
GLUCOSE: 100 mg/dL — AB (ref 65–99)
POTASSIUM: 3.7 mmol/L (ref 3.5–5.1)
Sodium: 143 mmol/L (ref 135–145)

## 2018-05-11 MED ORDER — DILTIAZEM HCL ER COATED BEADS 180 MG PO CP24: 180.0000 mg | ORAL_CAPSULE | Freq: Two times a day (BID) | ORAL | 6 refills | Status: DC

## 2018-05-11 MED ORDER — WARFARIN SODIUM 7.5 MG PO TABS
3.7500 mg | ORAL_TABLET | Freq: Once | ORAL | Status: DC
  Filled 2018-05-11: qty 0.5

## 2018-05-11 MED ORDER — METOPROLOL TARTRATE 50 MG PO TABS: 50.0000 mg | ORAL_TABLET | Freq: Two times a day (BID) | ORAL | 6 refills | Status: DC

## 2018-05-11 NOTE — Progress Notes (Signed)
Patient resting comfortably during shift report. Denies complaints.  

## 2018-05-11 NOTE — Progress Notes (Signed)
Call placed to CCMD to notify of telemetry monitoring d/c.   

## 2018-05-11 NOTE — Plan of Care (Signed)
  Problem: Education: Goal: Knowledge of General Education information will improve Outcome: Progressing   Problem: Health Behavior/Discharge Planning: Goal: Ability to manage health-related needs will improve Outcome: Progressing   Problem: Skin Integrity: Goal: Risk for impaired skin integrity will decrease Outcome: Progressing   

## 2018-05-11 NOTE — Telephone Encounter (Signed)
Spoke with [name], pt's daughter, regarding AVN ablation. Dr Graciela Husbands would like to cancel for now and follow up in clinic on 7/3 to monitor recent medication changes before proceeding. Per [name]'s request, MyChart message has been sent to [name] with upcoming appointment dates and times.

## 2018-05-11 NOTE — Progress Notes (Signed)
In speaking with night shift charge nurse it was reported that there was no impact to the patient's pacer site; that a telemetry lead was placed on it in low lighting, then upon recognizing the recent placement/site it was relocated on the patient's chest.   After relocation of the lead, pt c/o swelling. Per assigned nurse, Christen Bame, there was no further swelling than the beginning of his shift.   Ice was given to patient as an effort to calm her concerns, site looks great this morning, no drainage or swelling.   Cardiology has rounded on patient and agrees that site looks great.   Pt educated on the reasons why the left arm cannot be moved post-pacer placement, and how a small weight on the site is not the same.

## 2018-05-11 NOTE — Consult Note (Signed)
   [facility] CM Inpatient Consult   05/11/2018  Karen Richardson 11-22-1934 [id number]  Patient screened for potential [place] Management services. Patient is in the [place] of the [place] [facility] services under patient's [facility] plan.    Met with the patient at the bedside.  Patient very talkative about her independence and home life.  She denies any needs for community care management.  The inpatient RNCM assigned the patient to [facility].  Patient states her current primary care provider listed Caren Macadam MD is currently leaving and she is unsure of who she will have a follow up appointment with.  Spoke with patient regarding [place] [facility] in her network.  She denies any needs as she has good transportation states, "I even have a [place] [laughingly] a go- cart on my property if I need to get around there."  Patient denies any community follow up needs at this time.  Patient did accept a brochure and 24 hour nurse advise line with contact information. For questions contact:   Natividad Brood, RN BSN [facility] Liaison  [phone] business mobile phone Toll free office [phone]

## 2018-05-11 NOTE — Care Management Note (Signed)
Case Management Note  Patient Details  Name: Karen Richardson MRN: [id number] Date of Birth: 1934-09-11  Subjective/Objective:  Shortness of Breath                 Action/Plan: CM talked to patient via phone to discuss DCP; Patient lives at home with her spouse; PCP is Dr Tinnie Gens; has private insurance with [facility] Medicare with prescription drug coverage, pharmacy of choice is [facility]; pt reports no problem getting her medication; DME - cane at home and is using a neighbors rollater ( rolling walker with seat); patient is agreeable to having her own rollater at discharge. Patient could benefit from a HHC services, choice offered, pt chose [facility]; Dan with [facility] called for arrangements; CM will continue to follow for progression of care.  Expected Discharge Date:   possibly 05/11/2018               Expected Discharge Plan:  Home w Home Health Services  In-House Referral:   [facility]  Discharge planning Services  CM Consult    Choice offered to:  Patient  DME Arranged:  Walker rolling DME Agency:  [facility]  HH Arranged:  RN, PT, Nurse's Aide [facility] Agency:  [facility]  Status of Service:  In process, will continue to follow  Reola Mosher [phone] 05/11/2018, 10:10 AM

## 2018-05-11 NOTE — Progress Notes (Signed)
ANTICOAGULATION CONSULT NOTE - follow up  Pharmacy Consult:  warfarin Indication: atrial fibrillation  Allergies  Allergen Reactions  . Codeine Nausea Only    Patient Measurements: Height: 5' 5.5" (166.4 cm) Weight: 165 lb 11.2 oz (75.2 kg)(Scale B) IBW/kg (Calculated) : 58.15  Vital Signs: Temp: 98 F (36.7 C) (06/12 0546) Temp Source: Oral (06/11 2037) BP: 119/79 (06/12 0546) Pulse Rate: 93 (06/12 0546)  Labs: Recent Labs    05/09/18 0542 05/10/18 0656 05/11/18 0629  HGB 12.0 11.0* 11.4*  HCT 37.8 34.2* 35.7*  PLT 178 185 151  LABPROT 23.6* 27.2* 27.1*  INR 2.12 2.54 2.54  CREATININE 1.31* 1.18* 1.27*   Estimated Creatinine Clearance: 35 mL/min (A) (by C-G formula based on SCr of 1.27 mg/dL (H)).  Assessment: [name] presented with SOB.  Pharmacy consulted to continue warfarin PTA medication for history of Afib.  Patient reported missing warfarin dose on 6/6. INR is therapeutic today at 2.54. Hemoglobin and platelets are stable. No overt bleeding documented. Will plan to resume PTA regime.  PTA warfarin dosing:  2.5mg  daily except 3.75mg  on WFSu  Goal of Therapy:  INR 2-3 Monitor platelets by anticoagulation protocol: Yes   Plan:  Warfarin 3.75mg  x 1 dose tonight  Daily CBC/ INR  Monitor for S/Sx of bleeding   Ruben Im, PharmD Clinical Pharmacist 05/11/2018 8:26 AM

## 2018-05-12 ENCOUNTER — Telehealth: Payer: Self-pay | Admitting: Internal Medicine

## 2018-05-12 ENCOUNTER — Telehealth: Payer: Self-pay | Admitting: *Deleted

## 2018-05-12 DIAGNOSIS — G40909 Epilepsy, unspecified, not intractable, without status epilepticus: Secondary | ICD-10-CM | POA: Diagnosis not present

## 2018-05-12 DIAGNOSIS — F329 Major depressive disorder, single episode, unspecified: Secondary | ICD-10-CM | POA: Diagnosis not present

## 2018-05-12 DIAGNOSIS — Z48812 Encounter for surgical aftercare following surgery on the circulatory system: Secondary | ICD-10-CM | POA: Diagnosis not present

## 2018-05-12 DIAGNOSIS — I481 Persistent atrial fibrillation: Secondary | ICD-10-CM | POA: Diagnosis not present

## 2018-05-12 DIAGNOSIS — I130 Hypertensive heart and chronic kidney disease with heart failure and stage 1 through stage 4 chronic kidney disease, or unspecified chronic kidney disease: Secondary | ICD-10-CM | POA: Diagnosis not present

## 2018-05-12 DIAGNOSIS — I5033 Acute on chronic diastolic (congestive) heart failure: Secondary | ICD-10-CM | POA: Diagnosis not present

## 2018-05-12 DIAGNOSIS — N183 Chronic kidney disease, stage 3 (moderate): Secondary | ICD-10-CM | POA: Diagnosis not present

## 2018-05-12 DIAGNOSIS — J439 Emphysema, unspecified: Secondary | ICD-10-CM | POA: Diagnosis not present

## 2018-05-12 DIAGNOSIS — F419 Anxiety disorder, unspecified: Secondary | ICD-10-CM | POA: Diagnosis not present

## 2018-05-12 NOTE — Telephone Encounter (Signed)
Remote transmission reviewed. Presenting rhythm: Irregular Vs 67-114bpm. No episodes recorded. 3%Vp. 17%>100bpm since 05/10/18.  Called to inform patient's daughter about remote results. I told daughter that I would inform Dr.Klein about patient's sx's and call patient if anything is recommended.   Patient's most recent home BP readings: 119/77, 132/89, 132/85  Patient currently feels fine per daughter.

## 2018-05-12 NOTE — Telephone Encounter (Signed)
Called patient/daughter to inform her that her wound check appointment will be at the [facility] street office with the device clinic since her AVN ablation at the hospital was cancelled. Daughter verbalized understanding.  Daughter stated that her mother had a "bad" night last night. She said that her heart was "jumping around" and it really scared her. Daughter states that patient had taken all of her medications as Rx'd. Daughter is not sure how patient's BP has been running, but she plans to check it this am.   I asked patient to send in a manual transmission. Verbal instructions provided. Will call patient/daughter once it's received.

## 2018-05-12 NOTE — Telephone Encounter (Signed)
Spoke with pt and her daughter today. I told them Dr Graciela Husbands reviewed her PPM remote readings and was not concerned. He states her HR in the hospital was running higher and is pleased it looked lower today. He would like for pt to discuss this further with him when she comes in for her OV. He states he and the pt agreed to try and medically manage her AF HR with medication before moving to AF Ablation. Medication changes may be made at a later time.   Pt had secondary complaint of "stomach spasms" where her abdomen started jumping today. She states this has not occurred before she got her PPM. I am concerned this could be diaphragmatic stimulation. Will forward encounter to device clinic for further assessment.

## 2018-05-12 NOTE — Telephone Encounter (Signed)
Spoke with pt's home health RN. Dr Graciela Husbands does not usually order home health PT or RN visits. This should be deferred to pt's PCP.

## 2018-05-12 NOTE — Telephone Encounter (Signed)
New message    [facility] nurse is calling to ask if they can send home health orders to Dr. Graciela Husbands to sign.

## 2018-05-13 ENCOUNTER — Encounter: Payer: Self-pay | Admitting: Family Medicine

## 2018-05-13 ENCOUNTER — Telehealth: Payer: Self-pay | Admitting: Family Medicine

## 2018-05-13 MED FILL — Heparin Sod (Porcine)-NaCl IV Soln 1000 Unit/500ML-0.9%: INTRAVENOUS | Qty: 1000 | Status: AC

## 2018-05-13 NOTE — Telephone Encounter (Signed)
Spoke with pt she stated that she was having feeling that felt like contractions but that they ran through the her whole body and that she complained of being very fatigued. Asked pt if she felt a thumping in her chest pt stated no not specifically in her chest and that she was having these feeling before pacemaker implant. Pt seemed a more historian and was unable to hear d/t dogs barking in background. spoke with daughter who was concerned about appointments and pts TSH, level of fatigue advised her that I would send a message to both Dr. Adela Glimpse and Dr. Tera Helper about pts TSH advised her to call Dr. Adela Glimpse office regarding pts fatigue. Pts daughter voiced understanding.

## 2018-05-13 NOTE — Telephone Encounter (Signed)
Patients daughter called in requesting a call back to go over thyroid readings. She states patient was recently in the hospital and has some things she wants to discuss. Cb# [id number]/ [id number]

## 2018-05-13 NOTE — Telephone Encounter (Signed)
Kennyth Arnold- Physical Therapist with [facility] called to request verbal for PT. 1x this week, and 2x for 3 weeks after. Cb#: [phone]

## 2018-05-13 NOTE — Telephone Encounter (Signed)
Please call and see what she needs. Karen Richardson. Tracie Harrier, MD

## 2018-05-15 ENCOUNTER — Encounter: Payer: Self-pay | Admitting: Student

## 2018-05-16 ENCOUNTER — Telehealth (HOSPITAL_COMMUNITY): Payer: Self-pay | Admitting: *Deleted

## 2018-05-16 ENCOUNTER — Encounter (INDEPENDENT_AMBULATORY_CARE_PROVIDER_SITE_OTHER): Payer: Self-pay

## 2018-05-16 ENCOUNTER — Other Ambulatory Visit: Payer: Self-pay | Admitting: Family Medicine

## 2018-05-16 ENCOUNTER — Telehealth: Payer: Self-pay

## 2018-05-16 DIAGNOSIS — R7989 Other specified abnormal findings of blood chemistry: Secondary | ICD-10-CM

## 2018-05-16 MED ORDER — METOPROLOL TARTRATE 50 MG PO TABS: 25.0000 mg | ORAL_TABLET | Freq: Two times a day (BID) | ORAL | 6 refills | Status: DC

## 2018-05-16 NOTE — Telephone Encounter (Signed)
T4 = 0.95 ng/dL on 7/94/32. Will see if T3 can be added. Daughter states it is best maybe if you speak with Sebastian Ache at the AFIB clinic or EP specialist. She states her mother hasn't seen Dr.K in a while and she doesn't seem to get anywhere with him. I advised her I would pass along the information and what the tx plan was. I advised her you would speak with her cardiologist prior to starting a medication with verbal understanding.

## 2018-05-16 NOTE — Telephone Encounter (Signed)
Patient's daughter called in with multiple complaints from over the weekend. Patient is very fatigued and believes it is due to the metoprolol which she had issues with in the past. Pt wanted to stop the drug -- will try decreasing the dose to half tab and see if symptoms improve. Daughter also concerned her tsh is abnormal - instructed her to contact PCP for further workup and treatment as this can impact afib burden/treatment. Also complaining of stomach spasms - encouraged follow up with PCP as well with this complaint. Daughter/patient will call if heart rates are not controlled with decreasing metoprolol dosing.

## 2018-05-16 NOTE — Telephone Encounter (Signed)
US thyroid scheduled for 05/25/18 at 1:30pm with arrival time of 1:15pm. Called daughter and notified her with verbal understanding. Daughter concerned mother may be too weak to get out and about. I told her she has a little over a week to decide. She verbalized understanding. Encouraged her to call with any further questions or concerns.

## 2018-05-16 NOTE — Telephone Encounter (Signed)
Ps see if ffree T3 qnd free T4 can be added. The TSH is not very significantly abnormal but I do understand her concern. Please let her know that I will reach out directly to her cardilogist to get specific word from the cardiologist before I do a trial of a low dose of sythroid in dr Haggler's absence. She will also need an [place] of her thyroid gland. I have ordered the test

## 2018-05-16 NOTE — Telephone Encounter (Signed)
Returning daughters ([name]) call regarding patient. LVM. Will try again later.

## 2018-05-16 NOTE — Telephone Encounter (Signed)
Spoke with daughter. She voiced concern over her mothers fatigue. She stated she can barely raise a glass of water to drink. She also stated that Lupita Leash from the AFIB clinic stated her thyroid issues needed to be addressed and these would cause fatigue. Patient was recently hospitalized with AFIB w/ RVR. Failed Tikoskyn. Pacemaker placed. Have sent message to Dr.Simpson as Dr.Hagler is out of the office.

## 2018-05-16 NOTE — Telephone Encounter (Signed)
Left vm on daughters vm ([name]) returning her call. Requested call back.

## 2018-05-16 NOTE — Telephone Encounter (Signed)
Spoke with [name] (patients daughter). She stated that she had spoken with the AFib clinic and patients new thyroid issues need to be addressed because these could be the cause of her fatigue. Her last TSH 12 days ago was 5.40. Dr.Hagler did not start her on medication because she felt her AFIB issue needed to be dealt with first.   Recently hospitalized for AFIB with RVR. Failed Tikosyn. Pace maker placed. Daughter says mother is so fatigued she barely lift a glass of water, she is always cold when everyone else is hot. Her daughter feels patient needs to be started on thyroid treatment. She is very frustrated. I advised her Dr.Hagler is unavailable but I would see what I could do for her.

## 2018-05-16 NOTE — Telephone Encounter (Signed)
No need tp add another lab then as we discussed and I am awauting the Doc's response , pls proceed with the [place]

## 2018-05-16 NOTE — Telephone Encounter (Signed)
Yes and Dr Alphonzo Grieve will be back then also

## 2018-05-17 ENCOUNTER — Telehealth: Payer: Self-pay | Admitting: Family Medicine

## 2018-05-17 ENCOUNTER — Observation Stay (HOSPITAL_COMMUNITY)
Admission: EM | Admit: 2018-05-17 | Discharge: 2018-05-19 | Disposition: A | Discharge status: Home / Self Care | Payer: Medicare HMO | Attending: Internal Medicine | Admitting: Internal Medicine

## 2018-05-17 ENCOUNTER — Emergency Department (HOSPITAL_COMMUNITY): Payer: Medicare HMO

## 2018-05-17 ENCOUNTER — Telehealth (HOSPITAL_COMMUNITY): Payer: Self-pay | Admitting: *Deleted

## 2018-05-17 ENCOUNTER — Encounter (HOSPITAL_COMMUNITY): Payer: Self-pay | Admitting: Emergency Medicine

## 2018-05-17 ENCOUNTER — Other Ambulatory Visit: Payer: Self-pay

## 2018-05-17 DIAGNOSIS — J439 Emphysema, unspecified: Secondary | ICD-10-CM | POA: Diagnosis not present

## 2018-05-17 DIAGNOSIS — G40909 Epilepsy, unspecified, not intractable, without status epilepticus: Secondary | ICD-10-CM | POA: Diagnosis not present

## 2018-05-17 DIAGNOSIS — Z7901 Long term (current) use of anticoagulants: Secondary | ICD-10-CM | POA: Insufficient documentation

## 2018-05-17 DIAGNOSIS — Z885 Allergy status to narcotic agent status: Secondary | ICD-10-CM | POA: Insufficient documentation

## 2018-05-17 DIAGNOSIS — Z86711 Personal history of pulmonary embolism: Secondary | ICD-10-CM | POA: Diagnosis not present

## 2018-05-17 DIAGNOSIS — Z9889 Other specified postprocedural states: Secondary | ICD-10-CM | POA: Diagnosis not present

## 2018-05-17 DIAGNOSIS — E785 Hyperlipidemia, unspecified: Secondary | ICD-10-CM | POA: Diagnosis not present

## 2018-05-17 DIAGNOSIS — Z8673 Personal history of transient ischemic attack (TIA), and cerebral infarction without residual deficits: Secondary | ICD-10-CM | POA: Insufficient documentation

## 2018-05-17 DIAGNOSIS — Z90711 Acquired absence of uterus with remaining cervical stump: Secondary | ICD-10-CM | POA: Diagnosis not present

## 2018-05-17 DIAGNOSIS — Z955 Presence of coronary angioplasty implant and graft: Secondary | ICD-10-CM | POA: Diagnosis not present

## 2018-05-17 DIAGNOSIS — I482 Chronic atrial fibrillation: Secondary | ICD-10-CM | POA: Insufficient documentation

## 2018-05-17 DIAGNOSIS — I481 Persistent atrial fibrillation: Secondary | ICD-10-CM | POA: Diagnosis not present

## 2018-05-17 DIAGNOSIS — I509 Heart failure, unspecified: Secondary | ICD-10-CM

## 2018-05-17 DIAGNOSIS — F419 Anxiety disorder, unspecified: Secondary | ICD-10-CM | POA: Diagnosis not present

## 2018-05-17 DIAGNOSIS — N183 Chronic kidney disease, stage 3 unspecified: Secondary | ICD-10-CM | POA: Diagnosis present

## 2018-05-17 DIAGNOSIS — K219 Gastro-esophageal reflux disease without esophagitis: Secondary | ICD-10-CM | POA: Insufficient documentation

## 2018-05-17 DIAGNOSIS — Z48812 Encounter for surgical aftercare following surgery on the circulatory system: Secondary | ICD-10-CM | POA: Diagnosis not present

## 2018-05-17 DIAGNOSIS — J9811 Atelectasis: Secondary | ICD-10-CM | POA: Diagnosis not present

## 2018-05-17 DIAGNOSIS — I5033 Acute on chronic diastolic (congestive) heart failure: Secondary | ICD-10-CM

## 2018-05-17 DIAGNOSIS — R0789 Other chest pain: Secondary | ICD-10-CM | POA: Diagnosis not present

## 2018-05-17 DIAGNOSIS — I130 Hypertensive heart and chronic kidney disease with heart failure and stage 1 through stage 4 chronic kidney disease, or unspecified chronic kidney disease: Secondary | ICD-10-CM | POA: Diagnosis not present

## 2018-05-17 DIAGNOSIS — Z7722 Contact with and (suspected) exposure to environmental tobacco smoke (acute) (chronic): Secondary | ICD-10-CM | POA: Diagnosis not present

## 2018-05-17 DIAGNOSIS — I081 Rheumatic disorders of both mitral and tricuspid valves: Principal | ICD-10-CM | POA: Insufficient documentation

## 2018-05-17 DIAGNOSIS — Z79899 Other long term (current) drug therapy: Secondary | ICD-10-CM | POA: Diagnosis not present

## 2018-05-17 DIAGNOSIS — J90 Pleural effusion, not elsewhere classified: Secondary | ICD-10-CM | POA: Insufficient documentation

## 2018-05-17 DIAGNOSIS — I420 Dilated cardiomyopathy: Secondary | ICD-10-CM | POA: Insufficient documentation

## 2018-05-17 DIAGNOSIS — I2699 Other pulmonary embolism without acute cor pulmonale: Secondary | ICD-10-CM | POA: Diagnosis present

## 2018-05-17 DIAGNOSIS — Z950 Presence of cardiac pacemaker: Secondary | ICD-10-CM | POA: Diagnosis not present

## 2018-05-17 DIAGNOSIS — Z820 Family history of epilepsy and other diseases of the nervous system: Secondary | ICD-10-CM | POA: Insufficient documentation

## 2018-05-17 DIAGNOSIS — G629 Polyneuropathy, unspecified: Secondary | ICD-10-CM | POA: Insufficient documentation

## 2018-05-17 DIAGNOSIS — I4819 Other persistent atrial fibrillation: Secondary | ICD-10-CM | POA: Diagnosis present

## 2018-05-17 DIAGNOSIS — R079 Chest pain, unspecified: Secondary | ICD-10-CM | POA: Diagnosis not present

## 2018-05-17 DIAGNOSIS — R0602 Shortness of breath: Secondary | ICD-10-CM | POA: Diagnosis not present

## 2018-05-17 DIAGNOSIS — F329 Major depressive disorder, single episode, unspecified: Secondary | ICD-10-CM | POA: Diagnosis not present

## 2018-05-17 HISTORY — DX: Acute on chronic diastolic (congestive) heart failure: I50.33

## 2018-05-17 LAB — PROTIME-INR
INR: 2
Prothrombin Time: 22.5 seconds — ABNORMAL HIGH (ref 11.4–15.2)

## 2018-05-17 LAB — CBC WITH DIFFERENTIAL/PLATELET
ABS IMMATURE GRANULOCYTES: 0 10*3/uL (ref 0.0–0.1)
BASOS ABS: 0.1 10*3/uL (ref 0.0–0.1)
BASOS PCT: 1 %
EOS ABS: 0.2 10*3/uL (ref 0.0–0.7)
EOS PCT: 3 %
HCT: 39.5 % (ref 36.0–46.0)
Hemoglobin: 12.2 g/dL (ref 12.0–15.0)
Immature Granulocytes: 0 %
Lymphocytes Relative: 23 %
Lymphs Abs: 1.5 10*3/uL (ref 0.7–4.0)
MCH: 30.7 pg (ref 26.0–34.0)
MCHC: 30.9 g/dL (ref 30.0–36.0)
MCV: 99.2 fL (ref 78.0–100.0)
MONO ABS: 0.6 10*3/uL (ref 0.1–1.0)
MONOS PCT: 10 %
NEUTROS ABS: 3.9 10*3/uL (ref 1.7–7.7)
Neutrophils Relative %: 63 %
PLATELETS: 168 10*3/uL (ref 150–400)
RBC: 3.98 MIL/uL (ref 3.87–5.11)
RDW: 14.5 % (ref 11.5–15.5)
WBC: 6.3 10*3/uL (ref 4.0–10.5)

## 2018-05-17 LAB — COMPREHENSIVE METABOLIC PANEL
ALBUMIN: 3.5 g/dL (ref 3.5–5.0)
ALK PHOS: 137 U/L — AB (ref 38–126)
ALT: 28 U/L (ref 14–54)
ANION GAP: 7 (ref 5–15)
AST: 27 U/L (ref 15–41)
BILIRUBIN TOTAL: 0.6 mg/dL (ref 0.3–1.2)
BUN: 21 mg/dL — ABNORMAL HIGH (ref 6–20)
CALCIUM: 8.7 mg/dL — AB (ref 8.9–10.3)
CO2: 19 mmol/L — AB (ref 22–32)
Chloride: 114 mmol/L — ABNORMAL HIGH (ref 101–111)
Creatinine, Ser: 1.27 mg/dL — ABNORMAL HIGH (ref 0.44–1.00)
GFR calc Af Amer: 44 mL/min — ABNORMAL LOW (ref >60)
GFR calc non Af Amer: 38 mL/min — ABNORMAL LOW (ref >60)
GLUCOSE: 141 mg/dL — AB (ref 65–99)
Potassium: 4.9 mmol/L (ref 3.5–5.1)
SODIUM: 140 mmol/L (ref 135–145)
TOTAL PROTEIN: 6.7 g/dL (ref 6.5–8.1)

## 2018-05-17 LAB — D-DIMER, QUANTITATIVE (NOT AT ARMC): D DIMER QUANT: 1.45 ug{FEU}/mL — AB (ref 0.00–0.50)

## 2018-05-17 LAB — I-STAT TROPONIN, ED
Troponin i, poc: 0 ng/mL (ref 0.00–0.08)
Troponin i, poc: 0 ng/mL (ref 0.00–0.08)

## 2018-05-17 LAB — BRAIN NATRIURETIC PEPTIDE: B Natriuretic Peptide: 1218.2 pg/mL — ABNORMAL HIGH (ref 0.0–100.0)

## 2018-05-17 MED ORDER — DIFLUPREDNATE 0.05 % OP EMUL: 1.0000 [drp] | Freq: Three times a day (TID) | OPHTHALMIC | Status: DC

## 2018-05-17 MED ORDER — DILTIAZEM HCL ER COATED BEADS 180 MG PO CP24
180.0000 mg | ORAL_CAPSULE | Freq: Two times a day (BID) | ORAL | Status: DC
  Administered 2018-05-17 – 2018-05-19 (×4): 180 mg via ORAL
  Filled 2018-05-17 (×4): qty 1

## 2018-05-17 MED ORDER — WARFARIN SODIUM 2.5 MG PO TABS
2.5000 mg | ORAL_TABLET | Freq: Once | ORAL | Status: AC
  Administered 2018-05-17: 2.5 mg via ORAL
  Filled 2018-05-17: qty 1

## 2018-05-17 MED ORDER — IOPAMIDOL (ISOVUE-370) INJECTION 76%
INTRAVENOUS | Status: AC
  Filled 2018-05-17: qty 100

## 2018-05-17 MED ORDER — ACETAMINOPHEN 325 MG PO TABS: 650.0000 mg | ORAL_TABLET | ORAL | Status: DC | PRN

## 2018-05-17 MED ORDER — IOPAMIDOL (ISOVUE-370) INJECTION 76%
100.0000 mL | Freq: Once | INTRAVENOUS | Status: AC | PRN
  Administered 2018-05-17: 100 mL via INTRAVENOUS

## 2018-05-17 MED ORDER — FUROSEMIDE 10 MG/ML IJ SOLN
40.0000 mg | Freq: Two times a day (BID) | INTRAMUSCULAR | Status: DC
  Administered 2018-05-17 – 2018-05-18 (×3): 40 mg via INTRAVENOUS
  Filled 2018-05-17 (×3): qty 4

## 2018-05-17 MED ORDER — METOPROLOL TARTRATE 25 MG PO TABS
25.0000 mg | ORAL_TABLET | Freq: Two times a day (BID) | ORAL | Status: DC
  Administered 2018-05-17 – 2018-05-18 (×2): 25 mg via ORAL
  Filled 2018-05-17 (×2): qty 1

## 2018-05-17 MED ORDER — NETARSUDIL DIMESYLATE 0.02 % OP SOLN: 1.0000 [drp] | Freq: Every day | OPHTHALMIC | Status: DC

## 2018-05-17 MED ORDER — CARBAMAZEPINE ER 100 MG PO TB12
100.0000 mg | ORAL_TABLET | Freq: Two times a day (BID) | ORAL | Status: DC
  Administered 2018-05-17 – 2018-05-19 (×4): 100 mg via ORAL
  Filled 2018-05-17 (×4): qty 1

## 2018-05-17 MED ORDER — SODIUM CHLORIDE 0.9 % IV SOLN: 250.0000 mL | INTRAVENOUS | Status: DC | PRN

## 2018-05-17 MED ORDER — SODIUM CHLORIDE 0.9% FLUSH
3.0000 mL | Freq: Two times a day (BID) | INTRAVENOUS | Status: DC
  Administered 2018-05-17 – 2018-05-19 (×4): 3 mL via INTRAVENOUS

## 2018-05-17 MED ORDER — ONDANSETRON HCL 4 MG/2ML IJ SOLN: 4.0000 mg | Freq: Four times a day (QID) | INTRAMUSCULAR | Status: DC | PRN

## 2018-05-17 MED ORDER — ATORVASTATIN CALCIUM 10 MG PO TABS
10.0000 mg | ORAL_TABLET | Freq: Every evening | ORAL | Status: DC
  Administered 2018-05-17 – 2018-05-18 (×2): 10 mg via ORAL
  Filled 2018-05-17 (×2): qty 1

## 2018-05-17 MED ORDER — WARFARIN - PHARMACIST DOSING INPATIENT: Freq: Every day | Status: DC

## 2018-05-17 MED ORDER — KETOROLAC TROMETHAMINE 0.5 % OP SOLN: 1.0000 [drp] | Freq: Four times a day (QID) | OPHTHALMIC | Status: DC

## 2018-05-17 MED ORDER — SODIUM CHLORIDE 0.9% FLUSH: 3.0000 mL | INTRAVENOUS | Status: DC | PRN

## 2018-05-17 NOTE — ED Triage Notes (Signed)
Pt having belly swelling- pacemaker placed last week. Chest pain is radiating "like a baby jumping around trying to get out". Patient is short of breath. Denies edema in extremities.
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
## 2018-05-17 NOTE — ED Provider Notes (Signed)
[facility] EMERGENCY DEPARTMENT Provider Note   CSN: [id number] Arrival date & time: 05/17/18  1327     History   Chief Complaint Chief Complaint  Patient presents with  . Chest Pain    HPI SALSABEEL GORELICK is a 84 y.o. female.  HPI  Patient is an 84 year old female presenting with a strange feeling in her stomach.  She reports that she thinks gas or something was moving around her stomach she said "it looked like a baby was trying to get out". Is no longer doing so.  She has no pain.  No vomiting no diarrhea.  Patient is a very poor historian.  Patient's husband is in even worse historian.  Talked about patient's care with her daughter in the phone.  Her daughter just left yesterday after a long stay here with her parents.  She thinks her not doing very well living on their own.    She reports that her mother is having difficulty in the evenings, after the sun goes down she becomes full of anxiety, difficulty sleeping etc.  We discussed how this could be an early sign of dementia but patient's daughter says she "remembers everything".  However patient is unable to tell me any of the names or types of her medications or when she was last hospitalized.  Patient's husband reportedly has been leaving the gas on and had trouble filling small tasks.  Expressed the sympathy with the home situation and offered to increase to home services.   As far as the patient's physical health, she has normal vital signs.  It is very difficult to tell what symptoms she is been having.  However nursing triage ordered both a d-dimer which is elevated as well as an elevated BNP.  We will follow these up with CT angios.   Past Medical History:  Diagnosis Date  . Acute on chronic diastolic (congestive) heart failure (HCC)   . Anxiety   . Atrial fibrillation with RVR (HCC) 12/01/2016  . CKD (chronic kidney disease), stage III (HCC) 12/12/2016  . Depression   . Dysphagia   . GERD  (gastroesophageal reflux disease) 12/01/2016  . Heart murmur   . History of esophageal stricture 12/10/2016  . Hyperlipidemia   . Hypertension    x 4 months  . Presbyesophagus 12/11/2016  . Pulmonary emboli (HCC) 12/01/2016  . Pulmonary embolism (HCC)   . Pulmonary emphysema (HCC) 09/06/2017  . Pulmonary nodule   . Seizures (HCC)   . Stroke [facility])     Patient Active Problem List   Diagnosis Date Noted  . Shortness of breath 05/05/2018  . Acute on chronic diastolic (congestive) heart failure (HCC)   . Other social stressor 12/17/2017  . Pulmonary emphysema (HCC) 09/06/2017  . Restrictive lung disease 07/02/2017  . Other secondary pulmonary hypertension (HCC) 07/02/2017  . Persistent atrial fibrillation (HCC)   . Esophageal dysphagia 02/03/2017  . Dizziness 01/07/2017  . Monitoring for long-term anticoagulant use 12/18/2016  . CKD (chronic kidney disease), stage III (HCC) 12/12/2016  . Seizure disorder (HCC) 12/12/2016  . Presbyesophagus 12/11/2016  . History of esophageal stricture 12/10/2016  . Atrial fibrillation with RVR (HCC) 12/01/2016  . Pulmonary emboli (HCC) 12/01/2016  . GERD (gastroesophageal reflux disease) 12/01/2016  . Arthralgia of both knees 01/11/2016  . Hypertension 09/22/2011    Past Surgical History:  Procedure Laterality Date  . ABDOMINAL HYSTERECTOMY    . BREAST ENHANCEMENT SURGERY     40 yrs ago  . CARDIOVERSION  N/A 05/03/2017   Procedure: CARDIOVERSION;  Surgeon: Elease Hashimoto Deloris Ping, MD;  Location: [facility] ENDOSCOPY;  Service: Cardiovascular;  Laterality: N/A;  . ESOPHAGEAL DILATION N/A 03/17/2017   Procedure: ESOPHAGEAL DILATION;  Surgeon: Malissa Hippo, MD;  Location[facility] ENDO SUITE;  Service: Endoscopy;  Laterality: N/A;  . ESOPHAGOGASTRODUODENOSCOPY N/A 03/17/2017   Procedure: ESOPHAGOGASTRODUODENOSCOPY (EGD);  Surgeon: Malissa Hippo, MD;  Location: [facility] ENDO SUITE;  Service: Endoscopy;  Laterality: N/A;  10:30  . PACEMAKER IMPLANT N/A 05/09/2018   Procedure:  PACEMAKER IMPLANT;  Surgeon: Duke Salvia, MD;  Location: [facility] INVASIVE CV LAB;  Service: Cardiovascular;  Laterality: N/A;  . PARTIAL HYSTERECTOMY    . SPINE SURGERY     Tumor removal  . TEE WITHOUT CARDIOVERSION N/A 05/06/2018   Procedure: TRANSESOPHAGEAL ECHOCARDIOGRAM (TEE);  Surgeon: Pricilla Riffle, MD;  Location: [facility] ENDOSCOPY;  Service: Cardiovascular;  Laterality: N/A;  . Tumor removed from spinal cord     benign     OB History    Gravida  4   Para  4   Term  4   Preterm      AB      Living  2     SAB      TAB      Ectopic      Multiple      Live Births               Home Medications    Prior to Admission medications   Medication Sig Start Date End Date Taking? Authorizing Provider  atorvastatin (LIPITOR) 10 MG tablet TAKE ONE TABLET BY MOUTH EVERY EVENING. 01/11/18   Laqueta [place], MD  carbamazepine (TEGRETOL XR) 100 MG 12 hr tablet Take 1 tablet by mouth twice daily (morning, bedtime) 05/04/18   Aliene Beams, MD  diltiazem (CARDIZEM CD) 180 MG 24 hr capsule Take 1 capsule (180 mg total) by mouth 2 (two) times daily. 05/11/18   Sheilah Pigeon, PA-C  DUREZOL 0.05 % EMUL Place 1 drop into the left eye 3 (three) times daily. 03/21/18   [provider]  ketorolac (ACULAR) 0.5 % ophthalmic solution Place 1 drop into the left eye 4 (four) times daily. 03/21/18   [provider]  metoprolol tartrate (LOPRESSOR) 50 MG tablet Take 0.5 tablets (25 mg total) by mouth 2 (two) times daily. 05/16/18   Newman Nip, NP  moxifloxacin (VIGAMOX) 0.5 % ophthalmic solution Place 1 drop into the left eye 4 (four) times daily. 03/21/18   [provider]  potassium chloride (K-DUR,KLOR-CON) 10 MEQ tablet TAKE ONE TABLET BY MOUTH TWICE DAILY. (MORNING ,BEDTIME) 04/05/18   Laqueta [place], MD  RHOPRESSA 0.02 % SOLN Place 1 drop into the left eye at bedtime. 03/21/18   [provider]  Vitamin D, Ergocalciferol, (DRISDOL) 50000 units CAPS  capsule TAKE ONE CAPSULE BY MOUTH EVERY 7 DAYS (FRIDAY MORNING) 08/18/17   Eustace Moore, MD  warfarin (COUMADIN) 2.5 MG tablet TAKE AS DIRECTED BY THE ANTICOAGULATION CLINIC Patient taking differently: On Sunday, Wednesday, and Friday take 3.75mg . take 2.5mg  on all other days 02/15/18   Laqueta [place], MD    Family History Family History  Problem Relation Age of Onset  . Pulmonary embolism Mother   . Colon cancer Mother        mass in colon  . Stroke Father   . Alcohol abuse Father   . CVA Father   . COPD Father   .  Diabetes Sister   . Heart failure Sister   . Brain cancer Sister   . Pancreatic cancer Sister   . Epilepsy Sister   . Schizophrenia Brother   . Alcohol abuse Brother   . Lung disease Neg Hx     Social History Social History   Tobacco Use  . Smoking status: Passive Smoke Exposure - Never Smoker  . Smokeless tobacco: Never Used  . Tobacco comment: Father as a child   Substance Use Topics  . Alcohol use: No  . Drug use: No     Allergies   Codeine   Review of Systems Review of Systems  Unable to perform ROS: Dementia     Physical Exam Updated Vital Signs BP 105/73   Pulse 81   Temp 97.6 F (36.4 C) (Oral)   Resp (!) 24   SpO2 95%   Physical Exam  Constitutional: She is oriented to person, place, and time. She appears well-developed and well-nourished.  HENT:  Head: Normocephalic and atraumatic.  Eyes: Pupils are equal, round, and reactive to light. EOM are normal. Right eye exhibits no discharge.  Cardiovascular: Normal rate and normal heart sounds. An irregularly irregular rhythm present.  No murmur heard. Pulmonary/Chest: Effort normal and breath sounds normal. She has no wheezes. She has no rales.  Abdominal: Soft. She exhibits no distension. There is no tenderness.  Musculoskeletal:       Right lower leg: She exhibits no edema.       Left lower leg: She exhibits no edema.  Neurological: She is oriented to person, place, and  time.  Skin: Skin is warm and dry. She is not diaphoretic.  Psychiatric: She has a normal mood and affect.  Nursing note and vitals reviewed.    ED Treatments / Results  Labs (all labs ordered are listed, but only abnormal results are displayed) Labs Reviewed  COMPREHENSIVE METABOLIC PANEL - Abnormal; Notable for the following components:      Result Value   Chloride 114 (*)    CO2 19 (*)    Glucose, Bld 141 (*)    BUN 21 (*)    Creatinine, Ser 1.27 (*)    Calcium 8.7 (*)    Alkaline Phosphatase 137 (*)    GFR calc non Af Amer 38 (*)    GFR calc Af Amer 44 (*)    All other components within normal limits  D-DIMER, QUANTITATIVE (NOT AT [facility]) - Abnormal; Notable for the following components:   D-Dimer, Quant 1.45 (*)    All other components within normal limits  BRAIN NATRIURETIC PEPTIDE - Abnormal; Notable for the following components:   B Natriuretic Peptide 1,218.2 (*)    All other components within normal limits  CBC WITH DIFFERENTIAL/PLATELET  I-STAT TROPONIN, ED    EKG EKG Interpretation  Date/Time:  Tuesday May 17 2018 13:34:51 EDT Ventricular Rate:  83 PR Interval:    QRS Duration: 76 QT Interval:  360 QTC Calculation: 423 R Axis:   92 Text Interpretation:   afib rate controlled.  Confirmed by Bary Castilla ([id number]) on 05/17/2018 3:50:20 PM   Radiology Dg Chest 2 View  Result Date: 05/17/2018 CLINICAL DATA:  Abdominal swelling.  Pacemaker. EXAM: CHEST - 2 VIEW COMPARISON:  05/10/2018. FINDINGS: Cardiac pacer stable position. Stable cardiomegaly with normal pulmonary vascularity. Mild basilar atelectasis. No focal alveolar infiltrate. No pleural effusion or pneumothorax. IMPRESSION: 1.  Cardiac pacer stable position.  Stable cardiomegaly. 2.  Low lung volumes with bibasilar atelectasis. Electronically Signed  ByMaisie Fus  Register   On: 05/17/2018 15:10    Procedures Procedures (including critical care time)  Medications Ordered in ED Medications - No  data to display   Initial Impression / Assessment and Plan / ED Course  I have reviewed the triage vital signs and the nursing notes.  Pertinent labs & imaging results that were available during my care of the patient were reviewed by me and considered in my medical decision making (see chart for details).     Patient is an 84 year old female presenting with a strange feeling in her stomach.  She reports that she thinks gas or something was moving around her stomach she said "it looked like a baby was trying to get out". Is no longer doing so.  She has no pain.  No vomiting no diarrhea.  Patient is a very poor historian.  Patient's husband is in even worse historian.  Talked about patient's care with her daughter in the phone.  Her daughter just left yesterday after a long stay here with her parents.  She thinks her not doing very well living on their own.    She reports that her mother is having difficulty in the evenings, after the sun goes down she becomes full of anxiety, difficulty sleeping etc.  We discussed how this could be an early sign of dementia but patient's daughter says she "remembers everything".  However patient is unable to tell me any of the names or types of her medications or when she was last hospitalized.  Patient's husband reportedly has been leaving the gas on and had trouble filling small tasks.  Expressed the sympathy with the home situation and offered to increase to home services.   As far as the patient's physical health, she has normal vital signs.  It is very difficult to tell what symptoms she is been having.  However nursing triage ordered both a d-dimer which is elevated as well as an elevated BNP.  We will follow these up with CT angios.  4:32 PM  Given elevated BNP, and patient's subjective shortness of breath as well as the reflux of contrast showing right heart failure, will discuss with cardiology.  Patient had echo done within the last several weeks which  did not show such bad right ventricle function.     Final Clinical Impressions(s) / ED Diagnoses   Final diagnoses:  None    ED Discharge Orders    None       Kourtland Coopman, Cindee Salt, MD 05/17/18 2351

## 2018-05-17 NOTE — Telephone Encounter (Signed)
Please call pt daughter Sed[place] Muta to discuss medicine--[phone]

## 2018-05-17 NOTE — Patient Outreach (Signed)
Patient triggered Red on [facility]i Heart Failure Dashboard, notification sent to:  Davina Green, RN 

## 2018-05-17 NOTE — ED Notes (Signed)
Pt transported to CT ?

## 2018-05-17 NOTE — ED Notes (Signed)
Paged [phone] to [phone] 

## 2018-05-17 NOTE — Patient Outreach (Signed)
[facility]) Care Management  05/17/2018  VERIDIANA AZURE 03-Aug-1934 [id number]   EMMI: heart failure red alert Referral date: 05/17/18 Referral reason: new worsening symptoms: YES, new swelling: yes , new shortness of breath: YES Insurance: [employer]  Day # 4  Telephone call to patient regarding EMMI heart failure red alert. Contact answering phone states patient is on her way to [facility]. HIPAA compliant message left with call back phone number.    PLAN:  RNCM will attempt 2nd telephone call to patient within 4 business days.  RNCM will send outreach letter to attempt contact.   [name] Ina RN,BSN,CCM [facility] Telephonic  [phone]

## 2018-05-17 NOTE — ED Notes (Signed)
Per MD ok for Pt to have PO fluid & food. Pt given PO fluids & food

## 2018-05-17 NOTE — Progress Notes (Addendum)
ANTICOAGULATION CONSULT NOTE - Initial Consult  Pharmacy Consult for warfarin Indication: atrial fibrillation  Allergies  Allergen Reactions  . Codeine Nausea Only    Patient Measurements: Height: 5\' 5"  (165.1 cm) Weight: 173 lb 14.4 oz (78.9 kg) IBW/kg (Calculated) : 57  Vital Signs: Temp: 97.6 F (36.4 C) (06/18 1346) Temp Source: Oral (06/18 1346) BP: 118/93 (06/18 2012) Pulse Rate: 69 (06/18 2013)  Labs: Recent Labs    05/17/18 1335 05/17/18 2034  HGB 12.2  --   HCT 39.5  --   PLT 168  --   LABPROT  --  22.5*  INR  --  2.00  CREATININE 1.27*  --     Estimated Creatinine Clearance: 35.5 mL/min (A) (by C-G formula based on SCr of 1.27 mg/dL (H)).   Medical History: Past Medical History:  Diagnosis Date  . Acute on chronic diastolic (congestive) heart failure (HCC)   . Anxiety   . Atrial fibrillation with RVR (HCC) 12/01/2016  . CKD (chronic kidney disease), stage III (HCC) 12/12/2016  . Depression   . Dysphagia   . GERD (gastroesophageal reflux disease) 12/01/2016  . Heart murmur   . History of esophageal stricture 12/10/2016  . Hyperlipidemia   . Hypertension    x 4 months  . Presbyesophagus 12/11/2016  . Pulmonary emboli (HCC) 12/01/2016  . Pulmonary embolism (HCC)   . Pulmonary emphysema (HCC) 09/06/2017  . Pulmonary nodule   . Seizures (HCC)   . Stroke [facility])    Assessment: 84 year old female here for afib and HF exacerbation. Patient on chronic warfarin and her INR is at goal at 2.0 tonight.   No bleeding issues noted. Med history states she has taken her dose today, will confirm.  Goal of Therapy:  INR 2-3 Monitor platelets by anticoagulation protocol: Yes   Plan:  Follow INR in am - if at goal will resume home warfarin regimen  Sheppard Coil PharmD., BCPS Clinical Pharmacist 05/17/2018 9:21 PM   Patient has not had her warfarin today so will give dose now. She also stated that she does not need any eye drops at this time, will d/c orders.    05/17/2018 9:31 PM

## 2018-05-17 NOTE — Progress Notes (Signed)
CSW and RN CM met with pt and pt's husband at bedside. Pt and pt's husband report that pt has home health established through [facility]. Pt stated the nurse was out earlier today. Nurse from home health did not tell pt to come to the ED. Pt and pt's husband decided to have pt come in. Pt has physical therapy through [facility] and physical therapy started last week.   No more CSW needs, CSW signing off.   Wendelyn Breslow, [facility]  [phone]

## 2018-05-17 NOTE — Telephone Encounter (Signed)
Patient's daughter called in this morning stating her mother is having altered mental status, confusion as well as shortness of breath and abdominal pain/stomach feels very hard. Weight is stable, BP is controlled. Pt has multiple complaints and daughter is very concerned. Due to various issues and mental status changes recommended they proceed to ER. Pt's daughter verbalized agreement.

## 2018-05-17 NOTE — ED Notes (Signed)
RN attempted to give Report; RN unavailable  

## 2018-05-18 ENCOUNTER — Observation Stay (HOSPITAL_BASED_OUTPATIENT_CLINIC_OR_DEPARTMENT_OTHER): Payer: Medicare HMO

## 2018-05-18 ENCOUNTER — Encounter: Payer: Self-pay | Admitting: Family Medicine

## 2018-05-18 ENCOUNTER — Other Ambulatory Visit: Payer: Self-pay

## 2018-05-18 DIAGNOSIS — I361 Nonrheumatic tricuspid (valve) insufficiency: Secondary | ICD-10-CM

## 2018-05-18 DIAGNOSIS — Z9114 Patient's other noncompliance with medication regimen: Secondary | ICD-10-CM | POA: Diagnosis not present

## 2018-05-18 DIAGNOSIS — I5033 Acute on chronic diastolic (congestive) heart failure: Secondary | ICD-10-CM

## 2018-05-18 DIAGNOSIS — N183 Chronic kidney disease, stage 3 (moderate): Secondary | ICD-10-CM | POA: Diagnosis not present

## 2018-05-18 DIAGNOSIS — N170 Acute kidney failure with tubular necrosis: Secondary | ICD-10-CM | POA: Diagnosis not present

## 2018-05-18 DIAGNOSIS — Z950 Presence of cardiac pacemaker: Secondary | ICD-10-CM

## 2018-05-18 LAB — BASIC METABOLIC PANEL
Anion gap: 8 (ref 5–15)
BUN: 17 mg/dL (ref 6–20)
CO2: 24 mmol/L (ref 22–32)
CREATININE: 1.33 mg/dL — AB (ref 0.44–1.00)
Calcium: 8.3 mg/dL — ABNORMAL LOW (ref 8.9–10.3)
Chloride: 109 mmol/L (ref 101–111)
GFR calc Af Amer: 42 mL/min — ABNORMAL LOW (ref >60)
GFR calc non Af Amer: 36 mL/min — ABNORMAL LOW (ref >60)
GLUCOSE: 92 mg/dL (ref 65–99)
Potassium: 3.8 mmol/L (ref 3.5–5.1)
SODIUM: 141 mmol/L (ref 135–145)

## 2018-05-18 LAB — PROTIME-INR
INR: 2.18
Prothrombin Time: 24.1 seconds — ABNORMAL HIGH (ref 11.4–15.2)

## 2018-05-18 MED ORDER — WARFARIN SODIUM 7.5 MG PO TABS
3.7500 mg | ORAL_TABLET | Freq: Once | ORAL | Status: AC
  Administered 2018-05-18: 3.75 mg via ORAL
  Filled 2018-05-18: qty 0.5

## 2018-05-18 MED ORDER — METOPROLOL TARTRATE 50 MG PO TABS
50.0000 mg | ORAL_TABLET | Freq: Two times a day (BID) | ORAL | Status: DC
  Administered 2018-05-18 – 2018-05-19 (×2): 50 mg via ORAL
  Filled 2018-05-18 (×2): qty 1

## 2018-05-18 MED ORDER — PANTOPRAZOLE SODIUM 40 MG PO TBEC
40.0000 mg | DELAYED_RELEASE_TABLET | Freq: Every day | ORAL | Status: DC
  Administered 2018-05-18 – 2018-05-19 (×2): 40 mg via ORAL
  Filled 2018-05-18 (×2): qty 1

## 2018-05-18 NOTE — Clinical Social Work Note (Signed)
CSW acknowledges consult, "Patient with increased anxiety husband with difficulty remembering, ? need help with palcement, call daughter Sedalia Muta [phone]." Patient is fully alert and oriented. Please consult PT and OT if patient potentially needs rehab placement.  Lorrinda Mazer, CSW [phone]

## 2018-05-18 NOTE — Progress Notes (Signed)
Pt daughter Karen Richardson called concerned about "what to tell the pharmacy" in regards to pt warfarin  Informed pt family that PT/INR is drawn daily and when pt is to discharge it will be written on the AVS the dose the MD is recommending  Pt daughter understanding  Pt daughter stated pt uses pharmacy on [address], [facility] RN aware

## 2018-05-18 NOTE — Progress Notes (Signed)
  Echocardiogram 2D Echocardiogram has been performed.  Tye Savoy 05/18/2018, 9:01 AM

## 2018-05-18 NOTE — Progress Notes (Addendum)
Progress Note  Patient Name: Karen Richardson Date of Encounter: 05/18/2018  Primary Cardiologist: Prentice Docker, MD  Electrophysiologist: Dr. Ladona Ridgel   Subjective   Feels ok this morning. She notes at home she had marked fatigue, exertional dyspnea and orthopnea.   Inpatient Medications    Scheduled Meds: . atorvastatin  10 mg Oral QPM  . carbamazepine  100 mg Oral BID  . diltiazem  180 mg Oral BID  . furosemide  40 mg Intravenous Q12H  . metoprolol tartrate  25 mg Oral BID  . pantoprazole  40 mg Oral Daily  . sodium chloride flush  3 mL Intravenous Q12H  . warfarin  3.75 mg Oral ONCE-1800  . Warfarin - Pharmacist Dosing Inpatient   Does not apply q1800   Continuous Infusions: . sodium chloride     PRN Meds: sodium chloride, acetaminophen, ondansetron (ZOFRAN) IV, sodium chloride flush   Vital Signs    Vitals:   05/17/18 2122 05/18/18 0331 05/18/18 0644 05/18/18 0942  BP: 132/87  127/83 (!) 123/98  Pulse: 93 93 90 (!) 112  Resp: 19     Temp: 98.1 F (36.7 C)  98 F (36.7 C)   TempSrc: Oral  Oral   SpO2: 97%  99% 96%  Weight:      Height:        Intake/Output Summary (Last 24 hours) at 05/18/2018 1006 Last data filed at 05/18/2018 0947 Gross per 24 hour  Intake 243 ml  Output 2600 ml  Net -2357 ml   Filed Weights   05/17/18 2106  Weight: 173 lb 14.4 oz (78.9 kg)    Telemetry    Atrial fibrillation in the low 100s - Personally Reviewed  ECG    Atrial fibrillation 80 bpm - Personally Reviewed  Physical Exam   GEN: Elderly white female, well developed, well nourished in no acute distress.   Neck: No JVD Cardiac: irreguarlly irregular rhythm, regular rate no murmurs, rubs, or gallops.  Respiratory: Clear to auscultation bilaterally. GI: Soft, nontender, non-distended  MS: No edema; No deformity. Neuro:  Nonfocal  Psych: Normal affect   Labs    Chemistry Recent Labs  Lab 05/17/18 1335 05/18/18 0529  NA 140 141  K 4.9 3.8  CL 114* 109    CO2 19* 24  GLUCOSE 141* 92  BUN 21* 17  CREATININE 1.27* 1.33*  CALCIUM 8.7* 8.3*  PROT 6.7  --   ALBUMIN 3.5  --   AST 27  --   ALT 28  --   ALKPHOS 137*  --   BILITOT 0.6  --   GFRNONAA 38* 36*  GFRAA 44* 42*  ANIONGAP 7 8     Hematology Recent Labs  Lab 05/17/18 1335  WBC 6.3  RBC 3.98  HGB 12.2  HCT 39.5  MCV 99.2  MCH 30.7  MCHC 30.9  RDW 14.5  PLT 168    Cardiac EnzymesNo results for input(s): TROPONINI in the last 168 hours.  Recent Labs  Lab 05/17/18 1340 05/17/18 1931  TROPIPOC 0.00 0.00     BNP Recent Labs  Lab 05/17/18 1335  BNP 1,218.2*     DDimer  Recent Labs  Lab 05/17/18 1335  DDIMER 1.45*     Radiology    Dg Chest 2 View  Result Date: 05/17/2018 CLINICAL DATA:  Abdominal swelling.  Pacemaker. EXAM: CHEST - 2 VIEW COMPARISON:  05/10/2018. FINDINGS: Cardiac pacer stable position. Stable cardiomegaly with normal pulmonary vascularity. Mild basilar atelectasis. No focal alveolar  infiltrate. No pleural effusion or pneumothorax. IMPRESSION: 1.  Cardiac pacer stable position.  Stable cardiomegaly. 2.  Low lung volumes with bibasilar atelectasis. Electronically Signed   By: Maisie Fus  Register   On: 05/17/2018 15:10   Ct Angio Chest Pe W And/or Wo Contrast  Result Date: 05/17/2018 CLINICAL DATA:  84 year old female status post pacemaker placement last week. Radiating chest pain, shortness of breath, fatigue. EXAM: CT ANGIOGRAPHY CHEST WITH CONTRAST TECHNIQUE: Multidetector CT imaging of the chest was performed using the standard protocol during bolus administration of intravenous contrast. Multiplanar CT image reconstructions and MIPs were obtained to evaluate the vascular anatomy. CONTRAST:  ISOVUE-370 IOPAMIDOL (ISOVUE-370) INJECTION 76% COMPARISON:  Chest radiographs 1458 hours today. Chest CTA 10/20/2017. FINDINGS: Cardiovascular: Good contrast bolus timing in the pulmonary arterial tree. Lower lung respiratory motion. No focal filling  defect identified in the pulmonary arteries to suggest acute pulmonary embolism. Moderate to severe cardiomegaly. No pericardial effusion. No IV contrast in the aorta. Calcified aortic atherosclerosis. Right heart enlargement plus prominent contrast reflux into dilated hepatic IVC and hepatic veins. Left chest transvenous approach cardiac pacemaker with small volume postoperative gas in the anterior left chest wall (series 5, image 13). Mild if any postoperative seroma near the generator device. Superimposed chronic breast implants. Mediastinum/Nodes: Negative.  No lymphadenopathy. Lungs/Pleura: Small layering pleural effusions, greater on the right. Atelectatic changes to the major airways which are otherwise patent. Mild dependent atelectasis in both lungs. No pulmonary edema or confluent pulmonary opacity. Upper Abdomen: Negative visible liver aside from prominent reflux of IV contrast into the dilated IVC and hepatic veins. Motion artifact limits detail of the visible gallbladder. Negative visible noncontrast spleen, pancreas, adrenal glands, and bowel in the upper abdomen. Musculoskeletal: Chronic T6 and T12 compression fractures. Stable visualized osseous structures. Review of the MIP images confirms the above findings. IMPRESSION: 1.  Negative for acute pulmonary embolus. 2. Severe cardiomegaly with reflux of right atrium vascular contrast into the IVC and dilated hepatic veins suggesting a degree of right heart failure. 3. Small layering pleural effusions greater on the right. No other acute pulmonary process. 4. Mild postoperative changes about the left chest cardiac pacemaker. Electronically Signed   By: Odessa Fleming M.D.   On: 05/17/2018 18:19    Cardiac Studies   2D Echo pending- in process   Patient Profile     84 y.o. female w/ persistent atrial fibrillation, intolerant to and failed amiodarone and Tikosyn. She recently underwent PPM implantation 05/09/18 with plans for eventual AV node ablation.  Discharged home on 05/11/18 and has felt poorly since, with dyspnea, malaise and fatigue. Presented back to [facility] on 05/17/18 and noted to be in acute on chronic diastolic HF and readmitted under general cardiology service. Additional PMH includes submassive PE in 11/2016, HTN, HLD, h/o CVA, seizures and CKD, stage III.   Assessment & Plan   1. Acute on Chronic Diastolic CHF: BNP elevated on admit at 1,218. Chest CT yesterday demonstrated severe cardiomegaly w/ reflux in the right atrium, vascular contrast into the IVC and dilated hepatic veins, suggesting a degree of right heart failure, plus small layering pleural effusions, R>L. Pt notes exertional dyspnea and orthopnea since recent hospital discharge. Started on IV lasix, 40 mg BID on readmission. Good UOP thus far. 2.6L out since last night. Net I/os negative 3L. BP, K and SCr stable (c/w baseline). Continue to diurese with IV Lasix. Continue with strict I/Os and daily BMPs.   2. Persistent Atrial Fibrillation: Previously treated with  amiodarone but was discontinued because of neuropathy and skin discoloration. Tried Tikosyn loading, but unfortunately developed markedly prolonged QT intervals, thus Tikosyn discontinued. Recent PPM implantation with plans for eventual AV node ablation. For now, continue rate control with Cardizem and metoprolol. Resting HR in the 90s-low 100s.  On coumadin for a/c, with therapeutic INR at 2.18.   3. PPM: implanted 05/09/18, per above. SJM single chamber. Device pocket well healed and stable.   4. Chronic Anticoagulation: on chronic coumadin therapy for atrial fibrillation, prior CVA and submassive PE in 2018. INR is therapeutic at 2.18.   5. CKD: slight bump in SCr overnight with diuresis, from 1.27>>1.33. Baseline is 1.1-1.3. GFR 36 mL/min. Will monitor closely while diuresing.    6. H/o Submassive PE: Jan 2018. On coumadin w/ therapeutic INR. D-dimer elevated on admit, however chest CT negative for PE  7. Seizure  Disorder: home meds continued on admit. Carbamazepine 100 mg BID.   For questions or updates, please contact [facility] Please consult [email or web address] for contact info under Cardiology/STEMI.   Signed, Robbie Lis, PA-C  05/18/2018, 10:06 AM    The patient was seen, examined and discussed with Brittainy M. Sharol Harness, PA-C and I agree with the above.   84 yo female with h/o chronic diastolic heart failure, recent dual chamber pacemaker implantation on 05/09/2018, chronic atrial fibrillation with failure to be able to take amiodarone and Tikosyn, with plan of a AVN ablation later,  malaise and fatigue likely due to atrial fibrillation, chronic kidney disease stage III, long-term use of anticoagulation, history of submassive pulmonary emboli in 2018 on chronic coumadin admitted yesterday with acute on chronic CHF, weight  After diuresis 2.3 L overnight 172 lbs, baseline 167 lbs, still fluid overloaded, I would continue iv Lasix 40 mg iv BID, Crea is at baseline 1.3. She remain in a-fib with ventricular rates 80-110, this might be a reason for her CHF exacerbation. We will plkan for an OP follow up with Dr Graciela Husbands (back in town next week). We will increase metoprolol to 50 mg po BID. This is  Also possibly sec to medication non-compliance (daughter out of town).  Repeat echo is pending.  TEE: 05/06/18 TEE, Dr. Tenny Craw LA, LAA, RA without masses AV mildly thickened, calcified Trace AI MV normal mIld MR TV normal Moderate to severe TR RA is dilated  PV is normal Mild PI DIfficult to assess LVE with angle of heart Mild fixed plaquing of thoracic aorta NO PFO as tested by color doppler only.  Tobias Alexander, MD 05/18/2018

## 2018-05-18 NOTE — Progress Notes (Signed)
ANTICOAGULATION CONSULT NOTE - Follow Up Consult  Pharmacy Consult for warfarin Indication: atrial fibrillation  Allergies  Allergen Reactions  . Codeine Nausea Only    Patient Measurements: Height: 5\' 5"  (165.1 cm) Weight: 173 lb 14.4 oz (78.9 kg) IBW/kg (Calculated) : 57  Vital Signs: Temp: 98 F (36.7 C) (06/19 0644) Temp Source: Oral (06/19 0644) BP: 127/83 (06/19 0644) Pulse Rate: 90 (06/19 0644)  Labs: Recent Labs    05/17/18 1335 05/17/18 2034 05/18/18 0529  HGB 12.2  --   --   HCT 39.5  --   --   PLT 168  --   --   LABPROT  --  22.5* 24.1*  INR  --  2.00 2.18  CREATININE 1.27*  --  1.33*    Estimated Creatinine Clearance: 33.9 mL/min (A) (by C-G formula based on SCr of 1.33 mg/dL (H)).  Assessment: CC/HPI: 84 yo f presenting with acute HF, abdominal discomfort and afib  PMH: afib, ckd3, HLD, HTN, hx of pe, hx of stroke  Anticoag: warfarin pta for afib. Admit INR 2  INR today 2.18 PTA warfarin 3.75 mg SuWF, 2.5 mg AOD   Renal: SCr 1.33  Heme/Onc: H&H 12.2/39.5, Plt 168  Goal of Therapy:  INR 2-3 Monitor platelets by anticoagulation protocol: Yes   Plan:  Warfarin 3.75 mg x 1 Daily INR  Isaac Bliss, PharmD, BCPS, BCCCP Clinical Pharmacist Clinical phone for 05/18/2018 from 7a-3:30p: [phone] If after 3:30p, please call main pharmacy at: [phone] 05/18/2018 8:59 AM

## 2018-05-18 NOTE — Telephone Encounter (Signed)
Spoke with patients daughter Sedalia Muta). She wanted to know why Protonix was no longer listed on patient's medication list. It was filled on 5/28 and packed in patient's bubble package for her medications. Looking at the chart, it looks like the chart the Protonix was discontinued on 6/5 by Dr.Hagler. No reason listed. Daughter wanted to know why the pharmacy wasn't notified. I told her I would send Dr.Hagler a message regarding this. She said the patient is currently hospitalized and she had told them her mother was supposed to be taking it, so they are giving it to her. I again told her I would send a message, however, Dr.Hagler is unavailable this week.

## 2018-05-18 NOTE — Care Management Obs Status (Signed)
MEDICARE OBSERVATION STATUS NOTIFICATION   Patient Details  Name: Karen Richardson MRN: [id number] Date of Birth: June 09, 1934   Medicare Observation Status Notification Given:  Yes    Lawerance Sabal, RN 05/18/2018, 2:47 PM

## 2018-05-18 NOTE — Care Management Note (Signed)
Case Management Note  Patient Details  Name: Karen Richardson MRN: [id number] Date of Birth: 18-May-1934  Subjective/Objective:         Patient recently DC'd with [facility] services PT RN through [facility]. Patient is from home with spouse, has cane and walker at home. CM will continue to follow for DC needs.            Action/Plan:   Expected Discharge Date:                  Expected Discharge Plan:  Home w Home Health Services  In-House Referral:     Discharge planning Services  CM Consult  Post Acute Care Choice:    Choice offered to:     DME Arranged:    DME Agency:     HH Arranged:  RN, PT HH Agency:  [facility]  Status of Service:  In process, will continue to follow  If discussed at Long Length of Stay Meetings, dates discussed:    Additional Comments:  Lawerance Sabal, RN 05/18/2018, 11:45 AM

## 2018-05-18 NOTE — Patient Outreach (Signed)
[facility]) Care Management  05/18/2018  QUATISHA HALABY 1934-02-21 [id number]  Care coordination: Upon chart review patient has been admitted to hospital.  [facility] hospital liaison's notified of admission.   PLAN: No additional follow up needed by this RNCM.  RNCM will close patient due to hospital admission  [name] Ina [facility] Telephonic  [phone]

## 2018-05-19 ENCOUNTER — Other Ambulatory Visit: Payer: Self-pay | Admitting: Cardiology

## 2018-05-19 ENCOUNTER — Other Ambulatory Visit: Payer: Self-pay

## 2018-05-19 DIAGNOSIS — I4819 Other persistent atrial fibrillation: Secondary | ICD-10-CM

## 2018-05-19 DIAGNOSIS — N170 Acute kidney failure with tubular necrosis: Secondary | ICD-10-CM | POA: Diagnosis not present

## 2018-05-19 DIAGNOSIS — I5043 Acute on chronic combined systolic (congestive) and diastolic (congestive) heart failure: Secondary | ICD-10-CM

## 2018-05-19 DIAGNOSIS — Z9114 Patient's other noncompliance with medication regimen: Secondary | ICD-10-CM

## 2018-05-19 DIAGNOSIS — Z950 Presence of cardiac pacemaker: Secondary | ICD-10-CM | POA: Diagnosis not present

## 2018-05-19 DIAGNOSIS — I5033 Acute on chronic diastolic (congestive) heart failure: Secondary | ICD-10-CM | POA: Diagnosis not present

## 2018-05-19 DIAGNOSIS — N183 Chronic kidney disease, stage 3 (moderate): Secondary | ICD-10-CM

## 2018-05-19 DIAGNOSIS — Z79899 Other long term (current) drug therapy: Secondary | ICD-10-CM

## 2018-05-19 HISTORY — DX: Other persistent atrial fibrillation: I48.19

## 2018-05-19 LAB — BASIC METABOLIC PANEL
ANION GAP: 10 (ref 5–15)
BUN: 21 mg/dL — ABNORMAL HIGH (ref 6–20)
CO2: 25 mmol/L (ref 22–32)
Calcium: 8.4 mg/dL — ABNORMAL LOW (ref 8.9–10.3)
Chloride: 106 mmol/L (ref 101–111)
Creatinine, Ser: 1.41 mg/dL — ABNORMAL HIGH (ref 0.44–1.00)
GFR calc Af Amer: 39 mL/min — ABNORMAL LOW (ref >60)
GFR, EST NON AFRICAN AMERICAN: 34 mL/min — AB (ref >60)
Glucose, Bld: 85 mg/dL (ref 65–99)
POTASSIUM: 3.7 mmol/L (ref 3.5–5.1)
SODIUM: 141 mmol/L (ref 135–145)

## 2018-05-19 LAB — ECHOCARDIOGRAM COMPLETE
Height: 65 in
WEIGHTICAEL: 2782.4 [oz_av]

## 2018-05-19 LAB — PROTIME-INR
INR: 2.21
PROTHROMBIN TIME: 24.3 s — AB (ref 11.4–15.2)

## 2018-05-19 MED ORDER — POTASSIUM CHLORIDE CRYS ER 10 MEQ PO TBCR: 10.0000 meq | EXTENDED_RELEASE_TABLET | Freq: Every day | ORAL | Status: DC

## 2018-05-19 MED ORDER — FUROSEMIDE 40 MG PO TABS
40.0000 mg | ORAL_TABLET | Freq: Every day | ORAL | Status: DC
Start: 2018-05-20 — End: 2018-05-19

## 2018-05-19 MED ORDER — FUROSEMIDE 10 MG/ML IJ SOLN
40.0000 mg | Freq: Two times a day (BID) | INTRAMUSCULAR | Status: DC
  Administered 2018-05-19: 40 mg via INTRAVENOUS
  Filled 2018-05-19: qty 4

## 2018-05-19 MED ORDER — FUROSEMIDE 40 MG PO TABS: 40.0000 mg | ORAL_TABLET | Freq: Every day | ORAL | 6 refills | Status: DC

## 2018-05-19 MED ORDER — WARFARIN SODIUM 7.5 MG PO TABS: 3.7500 mg | ORAL_TABLET | ORAL | Status: DC

## 2018-05-19 MED ORDER — METOPROLOL TARTRATE 50 MG PO TABS: 50.0000 mg | ORAL_TABLET | Freq: Two times a day (BID) | ORAL | 6 refills | Status: DC

## 2018-05-19 MED ORDER — WARFARIN SODIUM 2.5 MG PO TABS: 2.5000 mg | ORAL_TABLET | ORAL | Status: DC

## 2018-05-19 MED ORDER — MUPIROCIN CALCIUM 2 % EX CREA: TOPICAL_CREAM | Freq: Two times a day (BID) | CUTANEOUS | Status: DC

## 2018-05-19 NOTE — Discharge Summary (Signed)
Discharge Summary    Patient ID: Karen Richardson,  MRN: [id number], DOB/AGE: 03/09/1934 84 y.o.  Admit date: 05/17/2018 Discharge date: 05/19/2018  Primary Care Provider: Aliene Beams Primary Cardiologist: Prentice Docker, MD  Discharge Diagnoses    Principal Problem:   Acute on chronic diastolic CHF (congestive heart failure) (HCC) Active Problems:   Pulmonary emboli (HCC)   CKD (chronic kidney disease), stage III (HCC)   Seizure disorder (HCC)   Persistent atrial fibrillation (HCC)   Allergies Allergies  Allergen Reactions  . Codeine Nausea Only  . Amiodarone Other (See Comments)    Neuropathy and skin discoloration     Diagnostic Studies/Procedures    Echo  05/18/18 Study Conclusions  - Left ventricle: The cavity size was normal. Wall thickness was   normal. Systolic function was moderately to severely reduced. The   estimated ejection fraction was in the range of 30% to 35%. Wall   motion was normal; there were no regional wall motion   abnormalities. - Mitral valve: Calcified annulus. There was moderate   regurgitation. - Left atrium: The atrium was severely dilated. - Right ventricle: The cavity size was moderately dilated. Wall   thickness was normal. - Right atrium: The atrium was severely dilated. - Tricuspid valve: There was severe regurgitation. - Pulmonary arteries: Systolic pressure was moderately increased.   PA peak pressure: 49 mm Hg (S).  _____________   History of Present Illness     84 yof with persistent a fib, intolerant and failed amiodarone and tikosyn, now with [facility] [employer] with plans for AV node ablation.  Has been dyspneic, and with fatigue since discharge from [facility]PM. Came back to [facility] 05/17/18 with acute on chronic diastolic HF>  Her BNP was 1218.  Other hx of chronic diastolic HF, HTN, HLD, hx CVA, hx seizures and CKD-3.  She was admitted for diuresis.        Hospital Course     Consultants: none  Since admit pt is feeling  better still with fatigue but she feels well enough she would like to return home.  She is neg 5127 on lasix 40 mg IV BID.  Her wt is down from 173 to 162 lbs.  Less than her dry wt of 167 lbs.   Her HR is controlled unless she is walking far.  Her echo is with decreased EF now 30-35% and both atrium severely dilated.  Dr. Graciela Husbands is aware.  Hope is with AV node ablation the EF will improve.  She has severe TR and PA pressure 49 mmHg.   She is on coumadin for hx PE and atrial fib and INR is 2.21.   She ambulated this AM and did well but tired.    She has been seen and evaluated by Dr. Delton See and found stable for discharge.  She has follow up next week with Dr. Graciela Husbands.  Will cancel device clinic as pt lives an hour or so away and cannot come into town back to back.   She will be discharged with lasix 40 mg po daily, previously no lasix.  We also increased her BB to lopressor 50 mg BID.  Her K+ is 3.7, will add low dose Kdur.  In past she has had problems with high dose of BB.   _____________  Discharge Vitals Blood pressure 126/80, pulse 62, temperature 98.1 F (36.7 C), temperature source Oral, resp. rate 18, height 5\' 5"  (1.651 m), weight 162 lb 6.4 oz (73.7 kg), SpO2 96 %.  Filed Weights   05/17/18 2106 05/18/18 1023 05/19/18 0604  Weight: 173 lb 14.4 oz (78.9 kg) 166 lb (75.3 kg) 162 lb 6.4 oz (73.7 kg)    Labs & Radiologic Studies    CBC Recent Labs    05/17/18 1335  WBC 6.3  NEUTROABS 3.9  HGB 12.2  HCT 39.5  MCV 99.2  PLT 168   Basic Metabolic Panel Recent Labs    16/10/96 0529 05/19/18 0445  NA 141 141  K 3.8 3.7  CL 109 106  CO2 24 25  GLUCOSE 92 85  BUN 17 21*  CREATININE 1.33* 1.41*  CALCIUM 8.3* 8.4*   Liver Function Tests Recent Labs    05/17/18 1335  AST 27  ALT 28  ALKPHOS 137*  BILITOT 0.6  PROT 6.7  ALBUMIN 3.5   No results for input(s): LIPASE, AMYLASE in the last 72 hours. Cardiac Enzymes No results for input(s): CKTOTAL, CKMB, CKMBINDEX, TROPONINI  in the last 72 hours. BNP Invalid input(s): POCBNP D-Dimer Recent Labs    05/17/18 1335  DDIMER 1.45*   Hemoglobin A1C No results for input(s): HGBA1C in the last 72 hours. Fasting Lipid Panel No results for input(s): CHOL, HDL, LDLCALC, TRIG, CHOLHDL, LDLDIRECT in the last 72 hours. Thyroid Function Tests No results for input(s): TSH, T4TOTAL, T3FREE, THYROIDAB in the last 72 hours.  Invalid input(s): FREET3 _____________  Dg Chest 2 View  Result Date: 05/17/2018 CLINICAL DATA:  Abdominal swelling.  Pacemaker. EXAM: CHEST - 2 VIEW COMPARISON:  05/10/2018. FINDINGS: Cardiac pacer stable position. Stable cardiomegaly with normal pulmonary vascularity. Mild basilar atelectasis. No focal alveolar infiltrate. No pleural effusion or pneumothorax. IMPRESSION: 1.  Cardiac pacer stable position.  Stable cardiomegaly. 2.  Low lung volumes with bibasilar atelectasis. Electronically Signed   By: Maisie Fus  Register   On: 05/17/2018 15:10   Dg Chest 2 View  Result Date: 05/10/2018 CLINICAL DATA:  Post pacemaker placement soreness. EXAM: CHEST - 2 VIEW COMPARISON:  05/05/2018. FINDINGS: Cardiac pacer with lead tip over the right ventricle. Multiple monitoring leads are noted over the chest. Cardiomegaly. Mild left base subsegmental atelectasis. No focal pulmonary abnormality otherwise noted. Small bilateral pleural effusions. Stable mild mid and lower thoracic vertebral body compression fractures. IMPRESSION: 1. Cardiac pacer with lead tip over the right ventricle. Stable cardiomegaly. No pulmonary venous congestion or pulmonary edema. Small left pleural effusion. 2.  Mild left base subsegmental atelectasis. Electronically Signed   By: Maisie Fus  Register   On: 05/10/2018 08:52   Dg Chest 2 View  Result Date: 05/05/2018 CLINICAL DATA:  Atrial fibrillation, irregular heart rate. EXAM: CHEST - 2 VIEW COMPARISON:  04/14/2018. FINDINGS: Cardiomegaly. No consolidation or edema. No effusion or pneumothorax.  Chronic lower thoracic compression deformity. IMPRESSION: No active cardiopulmonary disease.  Cardiomegaly. Electronically Signed   By: Elsie Stain M.D.   On: 05/05/2018 12:34   Ct Angio Chest Pe W And/or Wo Contrast  Result Date: 05/17/2018 CLINICAL DATA:  84 year old female status post pacemaker placement last week. Radiating chest pain, shortness of breath, fatigue. EXAM: CT ANGIOGRAPHY CHEST WITH CONTRAST TECHNIQUE: Multidetector CT imaging of the chest was performed using the Richardson protocol during bolus administration of intravenous contrast. Multiplanar CT image reconstructions and MIPs were obtained to evaluate the vascular anatomy. CONTRAST:  ISOVUE-370 IOPAMIDOL (ISOVUE-370) INJECTION 76% COMPARISON:  Chest radiographs 1458 hours today. Chest CTA 10/20/2017. FINDINGS: Cardiovascular: Good contrast bolus timing in the pulmonary arterial tree. Lower lung respiratory motion. No focal filling defect identified in  the pulmonary arteries to suggest acute pulmonary embolism. Moderate to severe cardiomegaly. No pericardial effusion. No IV contrast in the aorta. Calcified aortic atherosclerosis. Right heart enlargement plus prominent contrast reflux into dilated hepatic IVC and hepatic veins. Left chest transvenous approach cardiac pacemaker with small volume postoperative gas in the anterior left chest wall (series 5, image 13). Mild if any postoperative seroma near the generator device. Superimposed chronic breast implants. Mediastinum/Nodes: Negative.  No lymphadenopathy. Lungs/Pleura: Small layering pleural effusions, greater on the right. Atelectatic changes to the major airways which are otherwise patent. Mild dependent atelectasis in both lungs. No pulmonary edema or confluent pulmonary opacity. Upper Abdomen: Negative visible liver aside from prominent reflux of IV contrast into the dilated IVC and hepatic veins. Motion artifact limits detail of the visible gallbladder. Negative visible  noncontrast spleen, pancreas, adrenal glands, and bowel in the upper abdomen. Musculoskeletal: Chronic T6 and T12 compression fractures. Stable visualized osseous structures. Review of the MIP images confirms the above findings. IMPRESSION: 1.  Negative for acute pulmonary embolus. 2. Severe cardiomegaly with reflux of right atrium vascular contrast into the IVC and dilated hepatic veins suggesting a degree of right heart failure. 3. Small layering pleural effusions greater on the right. No other acute pulmonary process. 4. Mild postoperative changes about the left chest cardiac pacemaker. Electronically Signed   By: Odessa Fleming M.D.   On: 05/17/2018 18:19   Disposition   Pt is being discharged home today in good condition.  Follow-up Plans & Appointments   Weigh daily and call the office for weight gain of 3 lbs in a day or 5 lbs in a week. Low salt diet, salt holds onto fluid.    You will need blood work when you see Dr. Graciela Husbands to check your kidney function.   Follow-up Information    Duke Salvia, MD Follow up on 05/24/2018.   Specialty:  Cardiology Why:  at 4:15 pm  Contact information: [address] [place] [address] [phone]            Discharge Medications   Allergies as of 05/19/2018      Reactions   Codeine Nausea Only   Amiodarone Other (See Comments)   Neuropathy and skin discoloration      Medication List    TAKE these medications   atorvastatin 10 MG tablet Commonly known as:  LIPITOR TAKE ONE TABLET BY MOUTH EVERY EVENING.   carbamazepine 100 MG 12 hr tablet Commonly known as:  TEGRETOL XR Take 1 tablet by mouth twice daily (morning, bedtime)   diltiazem 180 MG 24 hr capsule Commonly known as:  CARDIZEM CD Take 1 capsule (180 mg total) by mouth 2 (two) times daily.   DUREZOL 0.05 % Emul Generic drug:  Difluprednate Place 1 drop into the left eye 3 (three) times daily.   furosemide 40 MG tablet Commonly known as:  LASIX Take  1 tablet (40 mg total) by mouth daily. Start taking on:  05/20/2018   ketorolac 0.5 % ophthalmic solution Commonly known as:  ACULAR Place 1 drop into the left eye 4 (four) times daily.   metoprolol tartrate 50 MG tablet Commonly known as:  LOPRESSOR Take 1 tablet (50 mg total) by mouth 2 (two) times daily. What changed:  how much to take   moxifloxacin 0.5 % ophthalmic solution Commonly known as:  VIGAMOX Place 1 drop into the left eye 4 (four) times daily.   pantoprazole 40 MG tablet Commonly known as:  PROTONIX Take 40 mg by mouth daily.   potassium chloride 10 MEQ tablet Commonly known as:  K-DUR,KLOR-CON TAKE ONE TABLET BY MOUTH TWICE DAILY. (MORNING ,BEDTIME)   RHOPRESSA 0.02 % Soln Generic drug:  Netarsudil Dimesylate Place 1 drop into the left eye at bedtime.   Vitamin D (Ergocalciferol) 50000 units Caps capsule Commonly known as:  DRISDOL TAKE ONE CAPSULE BY MOUTH EVERY 7 DAYS (FRIDAY MORNING)   warfarin 2.5 MG tablet Commonly known as:  COUMADIN Take as directed. If you are unsure how to take this medication, talk to your nurse or doctor. Original instructions:  TAKE AS DIRECTED BY THE ANTICOAGULATION CLINIC What changed:  See the new instructions.        Acute coronary syndrome (MI, NSTEMI, STEMI, etc) this admission?: No.    Outstanding Labs/Studies   Bmp on 05/24/18 will ask Dr. Graciela Husbands to order.   Duration of Discharge Encounter   Greater than 30 minutes including physician time.  Signed, Sharlotte Alamo, NP 05/19/2018, 11:47 AM

## 2018-05-19 NOTE — Clinical Social Work Note (Signed)
Patient has orders to discharge home today.  CSW signing off.  Idamae Calisa Luckenbaugh, CSW [phone]  

## 2018-05-19 NOTE — Progress Notes (Signed)
ANTICOAGULATION CONSULT NOTE - Follow Up Consult  Pharmacy Consult for Coumadin Indication: afib, h/o CVA, h/o PE 2018  Allergies  Allergen Reactions  . Codeine Nausea Only  . Amiodarone     Neuropathy and skin discoloration     Patient Measurements: Height: 5\' 5"  (165.1 cm) Weight: 162 lb 6.4 oz (73.7 kg)(scale a) IBW/kg (Calculated) : 57  Vital Signs: Temp: 98.1 F (36.7 C) (06/20 0604) Temp Source: Oral (06/20 0604) BP: 126/80 (06/20 0604) Pulse Rate: 62 (06/20 0604)  Labs: Recent Labs    05/17/18 1335 05/17/18 2034 05/18/18 0529 05/19/18 0445  HGB 12.2  --   --   --   HCT 39.5  --   --   --   PLT 168  --   --   --   LABPROT  --  22.5* 24.1* 24.3*  INR  --  2.00 2.18 2.21  CREATININE 1.27*  --  1.33* 1.41*    Estimated Creatinine Clearance: 30.9 mL/min (A) (by C-G formula based on SCr of 1.41 mg/dL (H)).   Assessment: Anticoag: warfarin pta for afib and CVA (and h/o PE 2018). INR 2.21 PTA warfarin 3.75 mg SuWF, 2.5 mg AOD. Admit INR 2  Goal of Therapy:  INR 2-3 Monitor platelets by anticoagulation protocol: Yes   Plan:  Resume home regimen Daily INR  Rayn Shorb S. Merilynn Finland, PharmD, BCPS Clinical Staff Pharmacist Pager [phone]  Misty Stanley Stillinger 05/19/2018,10:39 AM

## 2018-05-19 NOTE — Discharge Instructions (Addendum)
Information on my medicine - Coumadin®   (Warfarin) ° °Why was Coumadin prescribed for you? °Coumadin was prescribed for you because you have a blood clot or a medical condition that can cause an increased risk of forming blood clots. Blood clots can cause serious health problems by blocking the flow of blood to the heart, lung, or brain. Coumadin can prevent harmful blood clots from forming. °As a reminder your indication for Coumadin is:   Stroke Prevention Because Of Atrial Fibrillation ° °What test will check on my response to Coumadin? °While on Coumadin (warfarin) you will need to have an INR test regularly to ensure that your dose is keeping you in the desired range. The INR (international normalized ratio) number is calculated from the result of the laboratory test called prothrombin time (PT). ° °If an INR APPOINTMENT HAS NOT ALREADY BEEN MADE FOR YOU please schedule an appointment to have this lab work done by your health care provider within 7 days. °Your INR goal is usually a number between:  2 to 3 or your provider may give you a more narrow range like 2-2.5.  Ask your health care provider during an office visit what your goal INR is. ° °What  do you need to  know  About  COUMADIN? °Take Coumadin (warfarin) exactly as prescribed by your healthcare provider about the same time each day.  DO NOT stop taking without talking to the doctor who prescribed the medication.  Stopping without other blood clot prevention medication to take the place of Coumadin may increase your risk of developing a new clot or stroke.  Get refills before you run out. ° °What do you do if you miss a dose? °If you miss a dose, take it as soon as you remember on the same day then continue your regularly scheduled regimen the next day.  Do not take two doses of Coumadin at the same time. ° °Important Safety Information °A possible side effect of Coumadin (Warfarin) is an increased risk of bleeding. You should call your healthcare  provider right away if you experience any of the following: °? Bleeding from an injury or your nose that does not stop. °? Unusual colored urine (red or dark brown) or unusual colored stools (red or black). °? Unusual bruising for unknown reasons. °? A serious fall or if you hit your head (even if there is no bleeding). ° °Some foods or medicines interact with Coumadin® (warfarin) and might alter your response to warfarin. To help avoid this: °? Eat a balanced diet, maintaining a consistent amount of Vitamin K. °? Notify your provider about major diet changes you plan to make. °? Avoid alcohol or limit your intake to 1 drink for women and 2 drinks for men per day. °(1 drink is 5 oz. wine, 12 oz. beer, or 1.5 oz. liquor.) ° °Make sure that ANY health care provider who prescribes medication for you knows that you are taking Coumadin (warfarin).  Also make sure the healthcare provider who is monitoring your Coumadin knows when you have started a new medication including herbals and non-prescription products. ° °Coumadin® (Warfarin)  Major Drug Interactions  °Increased Warfarin Effect Decreased Warfarin Effect  °Alcohol (large quantities) °Antibiotics (esp. Septra/Bactrim, Flagyl, Cipro) °Amiodarone (Cordarone) °Aspirin (ASA) °Cimetidine (Tagamet) °Megestrol (Megace) °NSAIDs (ibuprofen, naproxen, etc.) °Piroxicam (Feldene) °Propafenone (Rythmol SR) °Propranolol (Inderal) °Isoniazid (INH) °Posaconazole (Noxafil) Barbiturates (Phenobarbital) °Carbamazepine (Tegretol) °Chlordiazepoxide (Librium) °Cholestyramine (Questran) °Griseofulvin °Oral Contraceptives °Rifampin °Sucralfate (Carafate) °Vitamin K  ° °Coumadin® (Warfarin) Major Herbal   Interactions  Increased Warfarin Effect Decreased Warfarin Effect  Garlic Ginseng Ginkgo biloba Coenzyme Q10 Green tea St. Johns wort    Coumadin (Warfarin) FOOD Interactions  Eat a consistent number of servings per week of foods HIGH in Vitamin K (1 serving =  cup)  Collards  (cooked, or boiled & drained) Kale (cooked, or boiled & drained) Mustard greens (cooked, or boiled & drained) Parsley *serving size only =  cup Spinach (cooked, or boiled & drained) Swiss chard (cooked, or boiled & drained) Turnip greens (cooked, or boiled & drained)  Eat a consistent number of servings per week of foods MEDIUM-HIGH in Vitamin K (1 serving = 1 cup)  Asparagus (cooked, or boiled & drained) Broccoli (cooked, boiled & drained, or raw & chopped) Brussel sprouts (cooked, or boiled & drained) *serving size only =  cup Lettuce, raw (green leaf, endive, romaine) Spinach, raw Turnip greens, raw & chopped   These websites have more information on Coumadin (warfarin):  http://www.king-russell.com/; https://www.hines.net/;   Weigh daily and call the office for weight gain of 3 lbs in a day or 5 lbs in a week. Low salt diet, salt holds onto fluid.    You will need blood work when you see Dr. Graciela Husbands to check your kidney function.

## 2018-05-19 NOTE — Progress Notes (Addendum)
Progress Note  Patient Name: Karen Richardson Date of Encounter: 05/19/2018  Primary Cardiologist: Prentice Docker, MD   Subjective   No chest pain and no SOB, though with talking mild dyspnea.    Inpatient Medications    Scheduled Meds: . atorvastatin  10 mg Oral QPM  . carbamazepine  100 mg Oral BID  . diltiazem  180 mg Oral BID  . furosemide  40 mg Intravenous BID  . metoprolol tartrate  50 mg Oral BID  . pantoprazole  40 mg Oral Daily  . sodium chloride flush  3 mL Intravenous Q12H  . Warfarin - Pharmacist Dosing Inpatient   Does not apply q1800   Continuous Infusions: . sodium chloride     PRN Meds: sodium chloride, acetaminophen, ondansetron (ZOFRAN) IV, sodium chloride flush   Vital Signs    Vitals:   05/18/18 1023 05/18/18 1200 05/18/18 1959 05/19/18 0604  BP:  118/76 122/73 126/80  Pulse:  75 61 62  Resp:  18 18 18   Temp:  97.9 F (36.6 C) 98 F (36.7 C) 98.1 F (36.7 C)  TempSrc:  Oral Oral Oral  SpO2:  96% 98% 96%  Weight: 166 lb (75.3 kg)   162 lb 6.4 oz (73.7 kg)  Height:        Intake/Output Summary (Last 24 hours) at 05/19/2018 1020 Last data filed at 05/19/2018 0921 Gross per 24 hour  Intake 480 ml  Output 3250 ml  Net -2770 ml   Filed Weights   05/17/18 2106 05/18/18 1023 05/19/18 0604  Weight: 173 lb 14.4 oz (78.9 kg) 166 lb (75.3 kg) 162 lb 6.4 oz (73.7 kg)    Telemetry    A fib rate mostly controlled up to 120 on occ an drare pced beat. - Personally Reviewed  ECG    No new - Personally Reviewed  Physical Exam   GEN: No acute distress.   Neck: + JVD Cardiac: irreg irreg, no murmurs, rubs, or gallops.  Respiratory: Clear to auscultation bilaterally. GI: Soft, nontender, non-distended  MS: No to tr edema; No deformity. Small ulcer on back of rt calf. No redness no drainage. Neuro:  Nonfocal  Psych: Normal affect  Would like to go home  Labs    Chemistry Recent Labs  Lab 05/17/18 1335 05/18/18 0529 05/19/18 0445  NA  140 141 141  K 4.9 3.8 3.7  CL 114* 109 106  CO2 19* 24 25  GLUCOSE 141* 92 85  BUN 21* 17 21*  CREATININE 1.27* 1.33* 1.41*  CALCIUM 8.7* 8.3* 8.4*  PROT 6.7  --   --   ALBUMIN 3.5  --   --   AST 27  --   --   ALT 28  --   --   ALKPHOS 137*  --   --   BILITOT 0.6  --   --   GFRNONAA 38* 36* 34*  GFRAA 44* 42* 39*  ANIONGAP 7 8 10      Hematology Recent Labs  Lab 05/17/18 1335  WBC 6.3  RBC 3.98  HGB 12.2  HCT 39.5  MCV 99.2  MCH 30.7  MCHC 30.9  RDW 14.5  PLT 168    Cardiac EnzymesNo results for input(s): TROPONINI in the last 168 hours.  Recent Labs  Lab 05/17/18 1340 05/17/18 1931  TROPIPOC 0.00 0.00     BNP Recent Labs  Lab 05/17/18 1335  BNP 1,218.2*     DDimer  Recent Labs  Lab 05/17/18 1335  DDIMER 1.45*     Radiology    Dg Chest 2 View  Result Date: 05/17/2018 CLINICAL DATA:  Abdominal swelling.  Pacemaker. EXAM: CHEST - 2 VIEW COMPARISON:  05/10/2018. FINDINGS: Cardiac pacer stable position. Stable cardiomegaly with normal pulmonary vascularity. Mild basilar atelectasis. No focal alveolar infiltrate. No pleural effusion or pneumothorax. IMPRESSION: 1.  Cardiac pacer stable position.  Stable cardiomegaly. 2.  Low lung volumes with bibasilar atelectasis. Electronically Signed   By: Maisie Fus  Register   On: 05/17/2018 15:10   Ct Angio Chest Pe W And/or Wo Contrast  Result Date: 05/17/2018 CLINICAL DATA:  84 year old female status post pacemaker placement last week. Radiating chest pain, shortness of breath, fatigue. EXAM: CT ANGIOGRAPHY CHEST WITH CONTRAST TECHNIQUE: Multidetector CT imaging of the chest was performed using the standard protocol during bolus administration of intravenous contrast. Multiplanar CT image reconstructions and MIPs were obtained to evaluate the vascular anatomy. CONTRAST:  ISOVUE-370 IOPAMIDOL (ISOVUE-370) INJECTION 76% COMPARISON:  Chest radiographs 1458 hours today. Chest CTA 10/20/2017. FINDINGS: Cardiovascular:  Good contrast bolus timing in the pulmonary arterial tree. Lower lung respiratory motion. No focal filling defect identified in the pulmonary arteries to suggest acute pulmonary embolism. Moderate to severe cardiomegaly. No pericardial effusion. No IV contrast in the aorta. Calcified aortic atherosclerosis. Right heart enlargement plus prominent contrast reflux into dilated hepatic IVC and hepatic veins. Left chest transvenous approach cardiac pacemaker with small volume postoperative gas in the anterior left chest wall (series 5, image 13). Mild if any postoperative seroma near the generator device. Superimposed chronic breast implants. Mediastinum/Nodes: Negative.  No lymphadenopathy. Lungs/Pleura: Small layering pleural effusions, greater on the right. Atelectatic changes to the major airways which are otherwise patent. Mild dependent atelectasis in both lungs. No pulmonary edema or confluent pulmonary opacity. Upper Abdomen: Negative visible liver aside from prominent reflux of IV contrast into the dilated IVC and hepatic veins. Motion artifact limits detail of the visible gallbladder. Negative visible noncontrast spleen, pancreas, adrenal glands, and bowel in the upper abdomen. Musculoskeletal: Chronic T6 and T12 compression fractures. Stable visualized osseous structures. Review of the MIP images confirms the above findings. IMPRESSION: 1.  Negative for acute pulmonary embolus. 2. Severe cardiomegaly with reflux of right atrium vascular contrast into the IVC and dilated hepatic veins suggesting a degree of right heart failure. 3. Small layering pleural effusions greater on the right. No other acute pulmonary process. 4. Mild postoperative changes about the left chest cardiac pacemaker. Electronically Signed   By: Odessa Fleming M.D.   On: 05/17/2018 18:19    Cardiac Studies   TEE: 05/06/18 TEE, Dr. Tenny Craw LA, LAA, RA without masses AV mildly thickened, calcified Trace AI MV normal mIld MR TV normal  Moderate to severe TR RA is dilated  PV is normal Mild PI DIfficult to assess LVE with angle of heart Mild fixed plaquing of thoracic aorta NO PFO as tested by color doppler only  TTE pending, was done yesterday   Patient Profile     84 y.o. female w/ persistent atrial fibrillation, intolerant to and failed amiodarone and Tikosyn. She recently underwent PPM implantation 05/09/18 with plans for eventual AV node ablation. Discharged home on 05/11/18 and has felt poorly since, with dyspnea, malaise and fatigue. Presented back to [facility] on 05/17/18 and noted to be in acute on chronic diastolic HF and readmitted under general cardiology service. Additional PMH includes submassive PE in 11/2016, HTN, HLD, h/o CVA, seizures and CKD, stage III.    Assessment & Plan  Acute on chronic diastolic HF  --BNP elevated on admit at 1,218. Chest CT yesterday demonstrated severe cardiomegaly w/ reflux in the right atrium, vascular contrast into the IVC and dilated hepatic veins, suggesting a degree of right heart failure, plus small layering pleural effusions, R>L. --SOB improved --neg 5127 since admit and wt down from 173 to 162 lbs. --lasix 40 mg BID --Cr up from 1.27 to 1.41 today --Echo pending  Persistent atrial fib  --failed amiodarone due to neuropathy and skin discoloration --tikosyn intolerant due to markedly prolonged Qtc,  --recent PPM with plans for eventual AV node ablation.  Continue dilt and BB increased this admit.  --on coumadin, INR today 2.21--managed per pharmacy  PPM SJM device   Hx submassive PE 11/2016, on coumadin and chest CTA neg for PE this admit.  Hx seizures on carbamazepine 100 mg BID continue.  Leg ulcer back of Rt calf will ask wound care to see. Appears to be healing  For questions or updates, please contact [facility] Please consult [email or web address] for contact info under Cardiology/STEMI.     Signed, Nada Boozer, NP  05/19/2018, 10:20 AM    The patient was  seen, examined and discussed with Nada Boozer, NP and I agree with the above.   84 yo female with h/o chronic diastolic heart failure, recent dual chamber pacemaker implantation on 05/09/2018, chronic atrial fibrillation with failure to be able to take amiodarone and Tikosyn, with plan of a AVN ablation later,  malaise and fatigue likely due to atrial fibrillation, chronic kidney disease stage III, long-term use of anticoagulation, history of submassive pulmonary emboli in 2018 on chronic coumadin admitted yesterday with acute on chronic CHF, weight  After diuresis 2.3 L overnight 172 lbs, baseline 167 lbs, still fluid overloaded, I would continue iv Lasix 40 mg iv BID, Crea is at baseline 1.3. Up today to 1.4.  Diuresed well overnight negative 2.7 L, todays weight 163 lbs bellow her baseline, clear lungs. We will discharge on Lasix 40 mg po daily. HR better controlled with increased BB. Follow up appointment with Dr Graciela Husbands is arranged already.  Plan for an AVN ablation in the near future.   Tobias Alexander, MD 05/19/2018

## 2018-05-19 NOTE — Consult Note (Signed)
   [facility] CM Inpatient Consult   05/19/2018  DARBI CHANDRAN 1934/08/31 [id number]   Met with patient and husband at the bedside.  Patient admitted with HF exacerbation. Explained [employer] services with her [facility] Medicare. She states, "I have [place] and that's all I need right now."  Asked regarding for needs with ongoing education and follow up needs.  Patient still insist on no needs.  States, "All I know is Dr. Aundra Dubin was out of town and I was getting short of breath and ended back up here. Right now I feel tired because they give you all of this stuff to make you pee all night and I am ready for some sleep."  Patient's husband asked about some Medicare documents left for her to sign and she doesn't want to sign them. Patient encouraged to speak with inpatient care management. Patient's husband accepted the brochure, 24 hour nurse advise line, with contact information.  Offered health coach both states they were good with the home health for now. PA entered room and spoke with patient regarding eating salt.  Patient was eating Lays Potato Chips.  Patient declines for now.  Natividad Brood, RN BSN [facility] Liaison  [phone] business mobile phone Toll free office [phone]

## 2018-05-19 NOTE — Progress Notes (Signed)
Reviewed AVS with patient and patient's husband.  Answered their questions. Pt is stable and ready for discharge.

## 2018-05-19 NOTE — Telephone Encounter (Signed)
Noted  

## 2018-05-19 NOTE — Progress Notes (Signed)
Talked to her daughter in [place].  She is concerned the higher dose of metoprolol will be an issue.  Her mother does not do well at higher dose.  A fib clinic had decreased, but HR elevated when she came in.  Explained we are discharging on diuretic.  I discussed low salt with pt, she was eating potato chips when I went in the room  Pt has HH to come out and we would like them to continue to help with meds. Diet.

## 2018-05-19 NOTE — Consult Note (Signed)
WOC Nurse wound consult note Reason for Consult: Consult requested for right leg.  Pt states her cat caused the partial thickness abrasions Wound type: 2 areas of partial thickness wounds to right posterior calf; .5X.5X.1cm and .8X.8X.1cm, dry yellow wound beds, no odor or drainage Dressing procedure/placement/frequency: Foam dressing to protect and promote healing.  Discussed plan of care with patient. Please re-consult if further assistance is needed.  Thank-you,  Cammie Mcgee MSN, RN, CWOCN, [place], CNS [phone]

## 2018-05-19 NOTE — Care Management (Signed)
Pt discharged prior to CM assessment before discharge.   Pt active with [facility] for PT RN - agency aware of discharge and will resume services.  Resumptions are not needed as pt was in observation. CM unable to reach pt via phone - CM spoke with daughter - No CM Needs identified.   [facility] CM did however touch base wit pt prior to discharge - NO CM needs determined

## 2018-05-19 NOTE — Patient Outreach (Addendum)
[facility]) Care Management  05/19/2018  Karen Richardson 1934-05-29 [id number]   EMMI: heart failure Referral date: 6/201/9 Referral reason: new/ worsening problems: yes Insurance: [employer] Day #6  EMMI heart failure red alert received.   Patient has been in hospital and is being discharged today.  Will request for heart failure EMMI calls to be restarted.  Per update from hospital liaison, Charlesetta Shanks on 05/18/18 patient was offered [facility] care management services and declined.   PLAN:  RNCM will close patient due to patient being discharged from hospital today and patient declining [facility] services. Marland Kitchen  RNCM will request EMMI heart failure calls be restarted post discharge.  [name] Ina RN,BSN,CCM [facility] Telephonic  [phone]

## 2018-05-20 ENCOUNTER — Ambulatory Visit: Payer: Self-pay

## 2018-05-20 ENCOUNTER — Other Ambulatory Visit: Payer: Self-pay | Admitting: Cardiovascular Disease

## 2018-05-20 ENCOUNTER — Telehealth: Payer: Self-pay | Admitting: Cardiovascular Disease

## 2018-05-20 ENCOUNTER — Telehealth: Payer: Self-pay

## 2018-05-20 DIAGNOSIS — I130 Hypertensive heart and chronic kidney disease with heart failure and stage 1 through stage 4 chronic kidney disease, or unspecified chronic kidney disease: Secondary | ICD-10-CM | POA: Diagnosis not present

## 2018-05-20 DIAGNOSIS — N183 Chronic kidney disease, stage 3 (moderate): Secondary | ICD-10-CM | POA: Diagnosis not present

## 2018-05-20 DIAGNOSIS — F329 Major depressive disorder, single episode, unspecified: Secondary | ICD-10-CM | POA: Diagnosis not present

## 2018-05-20 DIAGNOSIS — J439 Emphysema, unspecified: Secondary | ICD-10-CM | POA: Diagnosis not present

## 2018-05-20 DIAGNOSIS — F419 Anxiety disorder, unspecified: Secondary | ICD-10-CM | POA: Diagnosis not present

## 2018-05-20 DIAGNOSIS — G40909 Epilepsy, unspecified, not intractable, without status epilepticus: Secondary | ICD-10-CM | POA: Diagnosis not present

## 2018-05-20 DIAGNOSIS — I5033 Acute on chronic diastolic (congestive) heart failure: Secondary | ICD-10-CM | POA: Diagnosis not present

## 2018-05-20 DIAGNOSIS — Z48812 Encounter for surgical aftercare following surgery on the circulatory system: Secondary | ICD-10-CM | POA: Diagnosis not present

## 2018-05-20 DIAGNOSIS — I481 Persistent atrial fibrillation: Secondary | ICD-10-CM | POA: Diagnosis not present

## 2018-05-20 NOTE — Telephone Encounter (Signed)
Returned a call to the dtr-[name] Harlow Ohms and instructed to have pt to continue taking Coumadin 1 tablet daily except 1 and 1/2 tablets on Mondays, Wednesdays, and Fridays and Recheck INR on Tuesday; Dtr prefers a Thursday and appt set for Thursday 05/26/18 at 945am. Dtr states she is handling business from [place] where she lives and will convey the appt to her parents.  Also, pt states she is never aware of the pt's current dose as she is unable to get a copy of the papers on the visit while in [facility]; advised that the instruction sheets are given in the office after every visit and if needed she could call after the pt's visit and inquire so she will know the dose if it changes. She stated she will call Misty Stanley and set up a plan for upcoming visit.

## 2018-05-20 NOTE — Telephone Encounter (Signed)
Transition Care Management Follow-up Telephone Call   Date discharged?  05/19/18           How have you been since you were released from the hospital? feeling pretty good. Slept good last night   Do you understand why you were in the hospital? Weight gain/acute on chronic CHF   Do you understand the discharge instructions? yes   Where were you discharged to? Home   Items Reviewed:  Medications reviewed: yes   Allergies reviewed: yes  Dietary changes reviewed: yes. Low sodium diet  Referrals reviewed: no new referrals   Functional Questionnaire:   Activities of Daily Living (ADLs):  has help if she needs it.     Any transportation issues/concerns? No   Any patient concerns? No   Confirmed importance and date/time of follow-up visits scheduled  May 27, 2018 at 11:40 am     Confirmed with patient if condition begins to worsen call PCP or go to the ER.  Patient was given the office number and encouraged to call back with question or concerns.  yes with verbal understanding.

## 2018-05-20 NOTE — Telephone Encounter (Signed)
Daughter called stating that Karen Richardson was discharged from [facility] on 05-19-18. She states that she was not given a coumdin schedule.  Please call [phone].

## 2018-05-23 ENCOUNTER — Ambulatory Visit: Payer: Medicare HMO

## 2018-05-23 ENCOUNTER — Other Ambulatory Visit: Payer: Self-pay

## 2018-05-23 ENCOUNTER — Encounter (HOSPITAL_COMMUNITY): Payer: Self-pay

## 2018-05-23 ENCOUNTER — Ambulatory Visit (HOSPITAL_COMMUNITY): Admit: 2018-05-23 | Payer: Medicare HMO | Admitting: Internal Medicine

## 2018-05-23 SURGERY — AV NODE ABLATION
Anesthesia: LOCAL

## 2018-05-23 NOTE — Patient Outreach (Signed)
[facility]) Care Management  05/23/2018  Karen Richardson 12/15/1933 [id number]  EMMI: heart failure red alert Referral date: 05/23/18 Referral reason: went to follow up appointment Insurance: medicare Day # 8 Attempt #1  Telephone call to patient regarding EMMI heart failure red alert. Unable to reach patient. HIPAA compliant voice message left with call back phone number  PLAN: RNCM will attempt 2nd telephone call to patient with in 4 business days.  RNCm will send outreach letter.   [name] Ina RN,BSN,CCM [facility] Telephonic  [phone]

## 2018-05-23 NOTE — Telephone Encounter (Signed)
Called daughter to discuss Dr.Haglers recommendations. She stated that her mom will just decide sometimes which pill she wants to take and which one she doesn't want to take. If she decides maybe this one or that one is maybe making her feel bad, she will just stop taking it. She was talked to sternly in the hospital about this and she couldn't do this. I told her daughter it is important to keep her appointment with Dr.Hagler on Friday and her daughter is concerned because of the amount of appts her mom has this week. Especially since Dr.Hagler is leaving the practice. I told her that was their choice, but I thought it was important, so that Dr.Hagler could discuss the importance of not stopping medications without first discussing it with a doctor. The daughter verbalized understanding.   Her daughter asked me to call her with the thyroid US results when we got them and I told her I would.

## 2018-05-23 NOTE — Telephone Encounter (Signed)
It was d/c because patient reported she did not want to be taking it and had not been taking it. She can continue if she would like. Advise her daughter that it would be beneficial for her to come to the next office visit. Karen Richardson. Tracie Harrier, MD

## 2018-05-24 ENCOUNTER — Encounter: Payer: Self-pay | Admitting: Internal Medicine

## 2018-05-24 ENCOUNTER — Other Ambulatory Visit: Payer: Medicare HMO

## 2018-05-24 ENCOUNTER — Ambulatory Visit: Payer: Medicare HMO | Admitting: Internal Medicine

## 2018-05-24 ENCOUNTER — Other Ambulatory Visit: Payer: Self-pay

## 2018-05-24 DIAGNOSIS — N183 Chronic kidney disease, stage 3 (moderate): Secondary | ICD-10-CM | POA: Diagnosis not present

## 2018-05-24 DIAGNOSIS — F419 Anxiety disorder, unspecified: Secondary | ICD-10-CM | POA: Diagnosis not present

## 2018-05-24 DIAGNOSIS — G40909 Epilepsy, unspecified, not intractable, without status epilepticus: Secondary | ICD-10-CM | POA: Diagnosis not present

## 2018-05-24 DIAGNOSIS — I130 Hypertensive heart and chronic kidney disease with heart failure and stage 1 through stage 4 chronic kidney disease, or unspecified chronic kidney disease: Secondary | ICD-10-CM | POA: Diagnosis not present

## 2018-05-24 DIAGNOSIS — J439 Emphysema, unspecified: Secondary | ICD-10-CM | POA: Diagnosis not present

## 2018-05-24 DIAGNOSIS — I482 Chronic atrial fibrillation, unspecified: Secondary | ICD-10-CM

## 2018-05-24 DIAGNOSIS — I481 Persistent atrial fibrillation: Secondary | ICD-10-CM | POA: Diagnosis not present

## 2018-05-24 DIAGNOSIS — Z48812 Encounter for surgical aftercare following surgery on the circulatory system: Secondary | ICD-10-CM | POA: Diagnosis not present

## 2018-05-24 DIAGNOSIS — F329 Major depressive disorder, single episode, unspecified: Secondary | ICD-10-CM | POA: Diagnosis not present

## 2018-05-24 DIAGNOSIS — I5033 Acute on chronic diastolic (congestive) heart failure: Secondary | ICD-10-CM | POA: Diagnosis not present

## 2018-05-24 NOTE — H&P (View-Only) (Signed)
Patient Care Team: Caren Macadam, MD as PCP - General (Family Medicine) Herminio Commons, MD as PCP - Cardiology (Cardiology) Dannielle Karvonen, RN as [place] [employer] [facility]   HPI  Karen Richardson is a 84 y.o. female Seen after having met her in the hospital.  She was admitted because of atrial fibrillation with rapid rates.  Plan had been Tikosyn initiation; QT intervals were markedly prolonged prompting his discontinuation.  She has not tolerated amiodarone in the past.  She is known to have left atrial enlargement but developed severe LV dysfunction with a rapid rates.  DATE TEST EF   1/18 Echo   60-65 %   3/18 Echo   55-65 %   6/19 Echo  30-35%     Her daughter has been concerned about compliance with medications. The patient Complaints continue to be of fatigue dyspnea and palpitations  No bleeding  Records and Results Reviewed   Past Medical History:  Diagnosis Date  . Acute on chronic diastolic (congestive) heart failure ([place])   . Anxiety   . Atrial fibrillation with RVR ([place]) 12/01/2016  . CKD (chronic kidney disease), stage III ([place]) 12/12/2016  . Depression   . Dysphagia   . GERD (gastroesophageal reflux disease) 12/01/2016  . Heart murmur   . History of esophageal stricture 12/10/2016  . Hyperlipidemia   . Hypertension    x 4 months  . Presbyesophagus 12/11/2016  . Pulmonary emboli ([place]) 12/01/2016  . Pulmonary embolism ([place])   . Pulmonary emphysema ([place]) 09/06/2017  . Pulmonary nodule   . Seizures ([place])   . Stroke [facility])     Past Surgical History:  Procedure Laterality Date  . ABDOMINAL HYSTERECTOMY    . BREAST ENHANCEMENT SURGERY     40 yrs ago  . CARDIOVERSION N/A 05/03/2017   Procedure: CARDIOVERSION;  Surgeon: Acie Fredrickson Wonda Cheng, MD;  Location: [facility];  Service: Cardiovascular;  Laterality: N/A;  . ESOPHAGEAL DILATION N/A 03/17/2017   Procedure: ESOPHAGEAL DILATION;  Surgeon: Rogene Houston, MD;  [facility];   Service: Endoscopy;  Laterality: N/A;  . ESOPHAGOGASTRODUODENOSCOPY N/A 03/17/2017   Procedure: ESOPHAGOGASTRODUODENOSCOPY (EGD);  Surgeon: Rogene Houston, MD;  [facility];  Service: Endoscopy;  Laterality: N/A;  10:30  . PACEMAKER IMPLANT N/A 05/09/2018   Procedure: PACEMAKER IMPLANT;  Surgeon: Deboraha Sprang, MD;  Location: [facility] CV LAB;  Service: Cardiovascular;  Laterality: N/A;  . PARTIAL HYSTERECTOMY    . SPINE SURGERY     Tumor removal  . TEE WITHOUT CARDIOVERSION N/A 05/06/2018   Procedure: TRANSESOPHAGEAL ECHOCARDIOGRAM (TEE);  Surgeon: Fay Records, MD;  Location: [facility] ENDOSCOPY;  Service: Cardiovascular;  Laterality: N/A;  . Tumor removed from spinal cord     benign    Current Meds  Medication Sig  . atorvastatin (LIPITOR) 10 MG tablet TAKE ONE TABLET BY MOUTH EVERY EVENING.  . carbamazepine (TEGRETOL XR) 100 MG 12 hr tablet Take 1 tablet by mouth twice daily (morning, bedtime)  . diltiazem (CARDIZEM CD) 180 MG 24 hr capsule Take 1 capsule (180 mg total) by mouth 2 (two) times daily.  . furosemide (LASIX) 40 MG tablet Take 1 tablet (40 mg total) by mouth daily.  . metoprolol tartrate (LOPRESSOR) 50 MG tablet Take 1 tablet (50 mg total) by mouth 2 (two) times daily.  . pantoprazole (PROTONIX) 40 MG tablet Take 40 mg by mouth daily.  . potassium chloride (K-DUR,KLOR-CON) 10 MEQ tablet  TAKE ONE TABLET BY MOUTH TWICE DAILY. (MORNING ,BEDTIME)  . Vitamin D, Ergocalciferol, (DRISDOL) 50000 units CAPS capsule TAKE ONE CAPSULE BY MOUTH EVERY 7 DAYS (FRIDAY MORNING)  . warfarin (COUMADIN) 2.5 MG tablet TAKE AS DIRECTED BY THE ANTICOAGULATION CLINIC    Allergies  Allergen Reactions  . Codeine Nausea Only  . Amiodarone Other (See Comments)    Neuropathy and skin discoloration       Review of Systems negative except from HPI and PMH  Physical Exam BP 132/74   Pulse 80   Ht _0  (1.651 m)   Wt 164 lb (74.4 kg)   SpO2 98%   BMI 27.29 kg/m  Well developed  and well nourished in no acute distress HENT normal E scleral and icterus clear Neck Supple JVP flat; carotids brisk and full Clear to ausculation Device pocket well healed; without hematoma or erythema.  There is no tethering  Regular rate and rhythm, no murmurs gallops or rub Soft with active bowel sounds No clubbing cyanosis tr Edema Alert and oriented, grossly normal motor and sensory function Skin Warm and Dry  ECG AFib 80  -/08/42  Assessment and  Plan  Atrial fib with RVR  Cardiomyopathy presumably nonischemic  CHF chronic sys   Patient with persistent atrial fibrillation with rapid rates.  30% faster than 100 bpm.  Significant symptomatic palpitations.  Not a candidate for antiarrhythmic therapy.  AV junction ablation with her previously implanted pacemaker remains I think the best option.  I reviewed with her husband and her daughter by telephone.  We reviewed benefits and risks and they are willing and agreed to proceed  We spent more than 50% of our >25 min visit in face to face counseling regarding the above      Current medicines are reviewed at length with the patient today .  The patient does not  have concerns regarding medicines.

## 2018-05-24 NOTE — Progress Notes (Signed)
    Patient Care Team: Hagler, Rachel, MD as PCP - General (Family Medicine) Koneswaran, Suresh A, MD as PCP - Cardiology (Cardiology) Green, Davina E, RN as [facility] Care Management   HPI  Karen Richardson is a 84 y.o. female Seen after having met her in the hospital.  She was admitted because of atrial fibrillation with rapid rates.  Plan had been Tikosyn initiation; QT intervals were markedly prolonged prompting his discontinuation.  She has not tolerated amiodarone in the past.  She is known to have left atrial enlargement but developed severe LV dysfunction with a rapid rates.  DATE TEST EF   1/18 Echo   60-65 %   3/18 Echo   55-65 %   6/19 Echo  30-35%     Her daughter has been concerned about compliance with medications. The patient Complaints continue to be of fatigue dyspnea and palpitations  No bleeding  Records and Results Reviewed   Past Medical History:  Diagnosis Date  . Acute on chronic diastolic (congestive) heart failure (HCC)   . Anxiety   . Atrial fibrillation with RVR (HCC) 12/01/2016  . CKD (chronic kidney disease), stage III (HCC) 12/12/2016  . Depression   . Dysphagia   . GERD (gastroesophageal reflux disease) 12/01/2016  . Heart murmur   . History of esophageal stricture 12/10/2016  . Hyperlipidemia   . Hypertension    x 4 months  . Presbyesophagus 12/11/2016  . Pulmonary emboli (HCC) 12/01/2016  . Pulmonary embolism (HCC)   . Pulmonary emphysema (HCC) 09/06/2017  . Pulmonary nodule   . Seizures (HCC)   . Stroke (HCC)     Past Surgical History:  Procedure Laterality Date  . ABDOMINAL HYSTERECTOMY    . BREAST ENHANCEMENT SURGERY     40 yrs ago  . CARDIOVERSION N/A 05/03/2017   Procedure: CARDIOVERSION;  Surgeon: Nahser, Philip J, MD;  [facility];  Service: Cardiovascular;  Laterality: N/A;  . ESOPHAGEAL DILATION N/A 03/17/2017   Procedure: ESOPHAGEAL DILATION;  Surgeon: Najeeb U Rehman, MD;  [facility];   Service: Endoscopy;  Laterality: N/A;  . ESOPHAGOGASTRODUODENOSCOPY N/A 03/17/2017   Procedure: ESOPHAGOGASTRODUODENOSCOPY (EGD);  Surgeon: Najeeb U Rehman, MD;  [facility];  Service: Endoscopy;  Laterality: N/A;  10:30  . PACEMAKER IMPLANT N/A 05/09/2018   Procedure: PACEMAKER IMPLANT;  Surgeon: Amiliana Foutz C, MD;  [facility] INVASIVE CV LAB;  Service: Cardiovascular;  Laterality: N/A;  . PARTIAL HYSTERECTOMY    . SPINE SURGERY     Tumor removal  . TEE WITHOUT CARDIOVERSION N/A 05/06/2018   Procedure: TRANSESOPHAGEAL ECHOCARDIOGRAM (TEE);  Surgeon: Ross, Paula V, MD;  [facility];  Service: Cardiovascular;  Laterality: N/A;  . Tumor removed from spinal cord     benign    Current Meds  Medication Sig  . atorvastatin (LIPITOR) 10 MG tablet TAKE ONE TABLET BY MOUTH EVERY EVENING.  . carbamazepine (TEGRETOL XR) 100 MG 12 hr tablet Take 1 tablet by mouth twice daily (morning, bedtime)  . diltiazem (CARDIZEM CD) 180 MG 24 hr capsule Take 1 capsule (180 mg total) by mouth 2 (two) times daily.  . furosemide (LASIX) 40 MG tablet Take 1 tablet (40 mg total) by mouth daily.  . metoprolol tartrate (LOPRESSOR) 50 MG tablet Take 1 tablet (50 mg total) by mouth 2 (two) times daily.  . pantoprazole (PROTONIX) 40 MG tablet Take 40 mg by mouth daily.  . potassium chloride (K-DUR,KLOR-CON) 10 MEQ tablet   TAKE ONE TABLET BY MOUTH TWICE DAILY. (MORNING ,BEDTIME)  . Vitamin D, Ergocalciferol, (DRISDOL) 50000 units CAPS capsule TAKE ONE CAPSULE BY MOUTH EVERY 7 DAYS (FRIDAY MORNING)  . warfarin (COUMADIN) 2.5 MG tablet TAKE AS DIRECTED BY THE ANTICOAGULATION CLINIC    Allergies  Allergen Reactions  . Codeine Nausea Only  . Amiodarone Other (See Comments)    Neuropathy and skin discoloration       Review of Systems negative except from HPI and PMH  Physical Exam BP 132/74   Pulse 80   Ht 5' 5" (1.651 m)   Wt 164 lb (74.4 kg)   SpO2 98%   BMI 27.29 kg/m  Well developed  and well nourished in no acute distress HENT normal E scleral and icterus clear Neck Supple JVP flat; carotids brisk and full Clear to ausculation Device pocket well healed; without hematoma or erythema.  There is no tethering  Regular rate and rhythm, no murmurs gallops or rub Soft with active bowel sounds No clubbing cyanosis tr Edema Alert and oriented, grossly normal motor and sensory function Skin Warm and Dry  ECG AFib 80  -/08/42  Assessment and  Plan  Atrial fib with RVR  Cardiomyopathy presumably nonischemic  CHF chronic sys   Patient with persistent atrial fibrillation with rapid rates.  30% faster than 100 bpm.  Significant symptomatic palpitations.  Not a candidate for antiarrhythmic therapy.  AV junction ablation with her previously implanted pacemaker remains I think the best option.  I reviewed with her husband and her daughter by telephone.  We reviewed benefits and risks and they are willing and agreed to proceed  We spent more than 50% of our >25 min visit in face to face counseling regarding the above      Current medicines are reviewed at length with the patient today .  The patient does not  have concerns regarding medicines.  

## 2018-05-24 NOTE — Patient Instructions (Signed)
Medication Instructions:  Your physician recommends that you continue on your current medications as directed. Please refer to the Current Medication list given to you today.  Labwork: None ordered.  Testing/Procedures: Your physician has recommended that you have an AVN ablation. Catheter ablation is a medical procedure used to treat some cardiac arrhythmias (irregular heartbeats). During catheter ablation, a long, thin, flexible tube is put into a blood vessel in your groin (upper thigh), or neck. This tube is called an ablation catheter. It is then guided to your heart through the blood vessel. Radio frequency waves destroy small areas of heart tissue where abnormal heartbeats may cause an arrhythmia to start. Please see the instruction sheet given to you today.  Follow-Up: Your physician recommends that you schedule a follow-up appointment in:   4 weeks with Dr Graciela Husbands  Any Other Special Instructions Will Be Listed Below (If Applicable).     If you need a refill on your cardiac medications before your next appointment, please call your pharmacy.

## 2018-05-24 NOTE — Patient Outreach (Signed)
[facility]) Care Management  05/24/2018  Karen Richardson Feb 13, 1934 [id number]   2nd EMMI: heart failure red alert Referral date: 05/23/18, 05/24/18 Referral reason: went to follow up appointment, Weight 169lbs, New worsening problems: yes Insurance: medicare Day # 8 and 11 Attempt #2  Telephone call to patient regarding EMMI heart  Failure red alert. Attempted listed home number.  Unable to reach patient. HIPAA compliant voice message left with call back phone number.   Attempted mobile number. Contact states he is patients husband and he is on the road.  States he just called patient as well and she did not answer.  HIPAA compliant message left with call back phone number.   PLAN: RNCM will attempt 3rd telephone call to patient within 4 business days.   [name] Ina RN,BSN,CCM [facility] Telephonic  [phone]

## 2018-05-25 ENCOUNTER — Ambulatory Visit (HOSPITAL_COMMUNITY)
Admission: RE | Admit: 2018-05-25 | Discharge: 2018-05-25 | Disposition: A | Discharge status: Home / Self Care | Payer: Medicare HMO | Source: Ambulatory Visit | Attending: Family Medicine | Admitting: Family Medicine

## 2018-05-25 ENCOUNTER — Encounter: Payer: Self-pay | Admitting: Family Medicine

## 2018-05-25 DIAGNOSIS — J439 Emphysema, unspecified: Secondary | ICD-10-CM | POA: Diagnosis not present

## 2018-05-25 DIAGNOSIS — R7989 Other specified abnormal findings of blood chemistry: Secondary | ICD-10-CM | POA: Insufficient documentation

## 2018-05-25 DIAGNOSIS — G40909 Epilepsy, unspecified, not intractable, without status epilepticus: Secondary | ICD-10-CM | POA: Diagnosis not present

## 2018-05-25 DIAGNOSIS — F419 Anxiety disorder, unspecified: Secondary | ICD-10-CM | POA: Diagnosis not present

## 2018-05-25 DIAGNOSIS — I5033 Acute on chronic diastolic (congestive) heart failure: Secondary | ICD-10-CM | POA: Diagnosis not present

## 2018-05-25 DIAGNOSIS — N183 Chronic kidney disease, stage 3 (moderate): Secondary | ICD-10-CM | POA: Diagnosis not present

## 2018-05-25 DIAGNOSIS — F329 Major depressive disorder, single episode, unspecified: Secondary | ICD-10-CM | POA: Diagnosis not present

## 2018-05-25 DIAGNOSIS — R946 Abnormal results of thyroid function studies: Secondary | ICD-10-CM | POA: Diagnosis not present

## 2018-05-25 DIAGNOSIS — I481 Persistent atrial fibrillation: Secondary | ICD-10-CM | POA: Diagnosis not present

## 2018-05-25 DIAGNOSIS — Z48812 Encounter for surgical aftercare following surgery on the circulatory system: Secondary | ICD-10-CM | POA: Diagnosis not present

## 2018-05-25 DIAGNOSIS — I130 Hypertensive heart and chronic kidney disease with heart failure and stage 1 through stage 4 chronic kidney disease, or unspecified chronic kidney disease: Secondary | ICD-10-CM | POA: Diagnosis not present

## 2018-05-26 ENCOUNTER — Ambulatory Visit (INDEPENDENT_AMBULATORY_CARE_PROVIDER_SITE_OTHER): Payer: Medicare HMO | Admitting: *Deleted

## 2018-05-26 ENCOUNTER — Encounter: Payer: Self-pay | Admitting: Cardiovascular Disease

## 2018-05-26 ENCOUNTER — Telehealth: Payer: Self-pay | Admitting: Internal Medicine

## 2018-05-26 ENCOUNTER — Other Ambulatory Visit: Payer: Self-pay

## 2018-05-26 DIAGNOSIS — I482 Chronic atrial fibrillation, unspecified: Secondary | ICD-10-CM

## 2018-05-26 DIAGNOSIS — Z5181 Encounter for therapeutic drug level monitoring: Secondary | ICD-10-CM | POA: Diagnosis not present

## 2018-05-26 DIAGNOSIS — I4891 Unspecified atrial fibrillation: Secondary | ICD-10-CM | POA: Diagnosis not present

## 2018-05-26 DIAGNOSIS — Z7901 Long term (current) use of anticoagulants: Secondary | ICD-10-CM | POA: Diagnosis not present

## 2018-05-26 LAB — POCT INR: INR: 1.8 — AB (ref 2.0–3.0)

## 2018-05-26 NOTE — Patient Instructions (Addendum)
Take coumadin 2 tablets tonight then increase dose to 1 1/2 tablets daily except 1 tablet on Tuesdays, Thursdays and Saturdays Do 2 - 3 serving of greens a week  Recheck in 2 weeks

## 2018-05-26 NOTE — Telephone Encounter (Signed)
New Message:      Pt's daughter is calling with questions about the ablation the pt is scheduled to have on 06/03/18

## 2018-05-26 NOTE — Patient Outreach (Signed)
[facility]) Care Management  05/26/2018  Karen Richardson 05-Oct-1934 [id number]    EMMI:heart failure red alert Referral date:05/23/18, 05/24/18. 05/26/18 Referral reason:went to follow up appointment, Weight 169lbs, New worsening problems: yes Insurance: medicare Day #8 and 11, 13 Attempt #3  Telephone call to patient regarding EMMI heart failure red alert. Unable to reach patient. HIPAA compliant voice message left with call back phone number.   PLAN :  If no return call will proceed with case closure.   [name] Ina RN,BSN,CCM [facility] Telephonic  [phone]

## 2018-05-26 NOTE — Telephone Encounter (Signed)
Pts daughter is calling today to inquire if her mother could have labs drawn in [place]. I have written new lab orders so pt can have pre procedure labs drawn at [address] in [place]. Pt's daughter stated she would call her mother to let her know and give her the address. She had no additional questions.

## 2018-05-26 NOTE — Addendum Note (Signed)
Addended by: Dareen Piano on: 05/26/2018 08:20 AM   Modules accepted: Orders

## 2018-05-27 ENCOUNTER — Other Ambulatory Visit: Payer: Self-pay

## 2018-05-27 ENCOUNTER — Encounter: Payer: Self-pay | Admitting: Family Medicine

## 2018-05-27 ENCOUNTER — Ambulatory Visit: Payer: Medicare HMO | Admitting: Family Medicine

## 2018-05-27 ENCOUNTER — Ambulatory Visit (INDEPENDENT_AMBULATORY_CARE_PROVIDER_SITE_OTHER): Payer: Medicare HMO | Admitting: Family Medicine

## 2018-05-27 VITALS — BP 116/70 | HR 72 | Temp 98.1°F | Resp 16 | Ht 65.5 in | Wt 164.0 lb

## 2018-05-27 DIAGNOSIS — G4700 Insomnia, unspecified: Secondary | ICD-10-CM

## 2018-05-27 DIAGNOSIS — I130 Hypertensive heart and chronic kidney disease with heart failure and stage 1 through stage 4 chronic kidney disease, or unspecified chronic kidney disease: Secondary | ICD-10-CM | POA: Diagnosis not present

## 2018-05-27 DIAGNOSIS — I481 Persistent atrial fibrillation: Secondary | ICD-10-CM | POA: Diagnosis not present

## 2018-05-27 DIAGNOSIS — F329 Major depressive disorder, single episode, unspecified: Secondary | ICD-10-CM | POA: Diagnosis not present

## 2018-05-27 DIAGNOSIS — F419 Anxiety disorder, unspecified: Secondary | ICD-10-CM

## 2018-05-27 DIAGNOSIS — G40909 Epilepsy, unspecified, not intractable, without status epilepticus: Secondary | ICD-10-CM | POA: Diagnosis not present

## 2018-05-27 DIAGNOSIS — R7989 Other specified abnormal findings of blood chemistry: Secondary | ICD-10-CM

## 2018-05-27 DIAGNOSIS — N183 Chronic kidney disease, stage 3 (moderate): Secondary | ICD-10-CM | POA: Diagnosis not present

## 2018-05-27 DIAGNOSIS — I5033 Acute on chronic diastolic (congestive) heart failure: Secondary | ICD-10-CM | POA: Diagnosis not present

## 2018-05-27 DIAGNOSIS — J439 Emphysema, unspecified: Secondary | ICD-10-CM | POA: Diagnosis not present

## 2018-05-27 DIAGNOSIS — Z48812 Encounter for surgical aftercare following surgery on the circulatory system: Secondary | ICD-10-CM | POA: Diagnosis not present

## 2018-05-27 DIAGNOSIS — I482 Chronic atrial fibrillation: Secondary | ICD-10-CM | POA: Diagnosis not present

## 2018-05-27 MED ORDER — MIRTAZAPINE 15 MG PO TABS: 15.0000 mg | ORAL_TABLET | Freq: Every day | ORAL | 1 refills | Status: DC
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
## 2018-05-27 NOTE — Telephone Encounter (Signed)
Please clarify this message. Is her daughter on her DPR? Janine Limbo. Tracie Harrier, MD

## 2018-05-27 NOTE — Patient Instructions (Signed)
  Start the Mirtazapine tablets at bedtime to help with anxiety and sleep.

## 2018-05-28 ENCOUNTER — Encounter: Payer: Self-pay | Admitting: Cardiovascular Disease

## 2018-05-28 LAB — BASIC METABOLIC PANEL
BUN / CREAT RATIO: 17 (ref 12–28)
BUN: 25 mg/dL (ref 8–27)
CO2: 22 mmol/L (ref 20–29)
CREATININE: 1.48 mg/dL — AB (ref 0.57–1.00)
Calcium: 9.1 mg/dL (ref 8.7–10.3)
Chloride: 104 mmol/L (ref 96–106)
GFR, EST AFRICAN AMERICAN: 38 mL/min/{1.73_m2} — AB (ref >59)
GFR, EST NON AFRICAN AMERICAN: 33 mL/min/{1.73_m2} — AB (ref >59)
Glucose: 134 mg/dL — ABNORMAL HIGH (ref 65–99)
Potassium: 4.2 mmol/L (ref 3.5–5.2)
SODIUM: 143 mmol/L (ref 134–144)

## 2018-05-28 LAB — CBC WITH DIFFERENTIAL/PLATELET
BASOS: 1 %
Basophils Absolute: 0 10*3/uL (ref 0.0–0.2)
EOS (ABSOLUTE): 0.1 10*3/uL (ref 0.0–0.4)
EOS: 1 %
HEMATOCRIT: 37.7 % (ref 34.0–46.6)
Hemoglobin: 12.7 g/dL (ref 11.1–15.9)
IMMATURE GRANULOCYTES: 0 %
Immature Grans (Abs): 0 10*3/uL (ref 0.0–0.1)
Lymphocytes Absolute: 1.2 10*3/uL (ref 0.7–3.1)
Lymphs: 23 %
MCH: 31.1 pg (ref 26.6–33.0)
MCHC: 33.7 g/dL (ref 31.5–35.7)
MCV: 92 fL (ref 79–97)
MONOS ABS: 0.7 10*3/uL (ref 0.1–0.9)
Monocytes: 14 %
NEUTROS ABS: 3.3 10*3/uL (ref 1.4–7.0)
NEUTROS PCT: 61 %
Platelets: 196 10*3/uL (ref 150–450)
RBC: 4.09 x10E6/uL (ref 3.77–5.28)
RDW: 14.7 % (ref 12.3–15.4)
WBC: 5.3 10*3/uL (ref 3.4–10.8)

## 2018-05-30 ENCOUNTER — Telehealth: Payer: Self-pay | Admitting: Internal Medicine

## 2018-05-30 ENCOUNTER — Other Ambulatory Visit: Payer: Self-pay

## 2018-05-30 DIAGNOSIS — I130 Hypertensive heart and chronic kidney disease with heart failure and stage 1 through stage 4 chronic kidney disease, or unspecified chronic kidney disease: Secondary | ICD-10-CM | POA: Diagnosis not present

## 2018-05-30 DIAGNOSIS — Z48812 Encounter for surgical aftercare following surgery on the circulatory system: Secondary | ICD-10-CM | POA: Diagnosis not present

## 2018-05-30 DIAGNOSIS — F329 Major depressive disorder, single episode, unspecified: Secondary | ICD-10-CM | POA: Diagnosis not present

## 2018-05-30 DIAGNOSIS — F419 Anxiety disorder, unspecified: Secondary | ICD-10-CM | POA: Diagnosis not present

## 2018-05-30 DIAGNOSIS — I5033 Acute on chronic diastolic (congestive) heart failure: Secondary | ICD-10-CM | POA: Diagnosis not present

## 2018-05-30 DIAGNOSIS — I481 Persistent atrial fibrillation: Secondary | ICD-10-CM | POA: Diagnosis not present

## 2018-05-30 DIAGNOSIS — J439 Emphysema, unspecified: Secondary | ICD-10-CM | POA: Diagnosis not present

## 2018-05-30 DIAGNOSIS — G40909 Epilepsy, unspecified, not intractable, without status epilepticus: Secondary | ICD-10-CM | POA: Diagnosis not present

## 2018-05-30 DIAGNOSIS — N183 Chronic kidney disease, stage 3 (moderate): Secondary | ICD-10-CM | POA: Diagnosis not present

## 2018-05-30 NOTE — Telephone Encounter (Signed)
Spoke with pt's daughter who was concerned about labs. Pt's labs were drawn at her PCP's appointment on 6/28. Pre procedure instructions were reviewed with daughter. No additional questions.

## 2018-05-30 NOTE — Telephone Encounter (Signed)
New Message:       Pt's daughter is calling to go over instructions for the pt's ablation on Friday. Pt's daughter is also calling to see if the lab orders for the pt has been sent to the [facility] in [place] so they won't have to come her tomorrow for labs.

## 2018-05-30 NOTE — Patient Outreach (Signed)
[facility]) Care Management  05/30/2018  ANABEL MIDGETTE 07/05/1934 [id number]   EMMI:heart failure red alert Referral date:05/23/18, 05/24/18. 05/26/18. 05/30/18 Referral reason:went to follow up appointment, Weight 169lbs, New worsening problems: yes, tired/ fatigued: yes, Lightheaded or dizzy: yes. Insurance: medicare Day #8and 11, 13 Attempt #4  Telephone call to patient regarding EMMI heart failure red alert. Unable to reach patient. HIPAA compliant voice message left with call back phone number.   PLAN :  If no return call will proceed with case closure.   [name] Ina RN,BSN,CCM [facility] Telephonic  [phone]

## 2018-05-31 ENCOUNTER — Telehealth: Payer: Self-pay | Admitting: Internal Medicine

## 2018-05-31 ENCOUNTER — Other Ambulatory Visit: Payer: Medicare HMO

## 2018-05-31 NOTE — Telephone Encounter (Signed)
Spoke with pt. She states she is feeling tired and wondered if her procedure will help her. I told her I believe she will feel better after her ablation and perhaps Dr Graciela Husbands may take her off some medications. We reviewed pre procedure instructions and she verbalized understanding. She had no additional questions.

## 2018-05-31 NOTE — Telephone Encounter (Signed)
New message    Patient's spouse calling to report is very tired, low energy. Requesting call from nurse to discuss procedure scheduled for 06/03/18

## 2018-06-01 ENCOUNTER — Encounter: Payer: Medicare HMO | Admitting: Internal Medicine

## 2018-06-02 DIAGNOSIS — Z48812 Encounter for surgical aftercare following surgery on the circulatory system: Secondary | ICD-10-CM | POA: Diagnosis not present

## 2018-06-02 DIAGNOSIS — I481 Persistent atrial fibrillation: Secondary | ICD-10-CM | POA: Diagnosis not present

## 2018-06-02 DIAGNOSIS — J439 Emphysema, unspecified: Secondary | ICD-10-CM | POA: Diagnosis not present

## 2018-06-02 DIAGNOSIS — F419 Anxiety disorder, unspecified: Secondary | ICD-10-CM | POA: Diagnosis not present

## 2018-06-02 DIAGNOSIS — G40909 Epilepsy, unspecified, not intractable, without status epilepticus: Secondary | ICD-10-CM | POA: Diagnosis not present

## 2018-06-02 DIAGNOSIS — N183 Chronic kidney disease, stage 3 (moderate): Secondary | ICD-10-CM | POA: Diagnosis not present

## 2018-06-02 DIAGNOSIS — I130 Hypertensive heart and chronic kidney disease with heart failure and stage 1 through stage 4 chronic kidney disease, or unspecified chronic kidney disease: Secondary | ICD-10-CM | POA: Diagnosis not present

## 2018-06-02 DIAGNOSIS — F329 Major depressive disorder, single episode, unspecified: Secondary | ICD-10-CM | POA: Diagnosis not present

## 2018-06-02 DIAGNOSIS — I5033 Acute on chronic diastolic (congestive) heart failure: Secondary | ICD-10-CM | POA: Diagnosis not present

## 2018-06-03 ENCOUNTER — Encounter (HOSPITAL_COMMUNITY)
Admission: RE | Disposition: A | Discharge status: Home / Self Care | Payer: Self-pay | Source: Ambulatory Visit | Attending: Internal Medicine

## 2018-06-03 ENCOUNTER — Ambulatory Visit (HOSPITAL_COMMUNITY)
Admission: RE | Admit: 2018-06-03 | Discharge: 2018-06-03 | Disposition: A | Discharge status: Home / Self Care | Payer: Medicare HMO | Source: Ambulatory Visit | Attending: Internal Medicine | Admitting: Internal Medicine

## 2018-06-03 DIAGNOSIS — R569 Unspecified convulsions: Secondary | ICD-10-CM

## 2018-06-03 DIAGNOSIS — N183 Chronic kidney disease, stage 3 (moderate): Secondary | ICD-10-CM

## 2018-06-03 DIAGNOSIS — I428 Other cardiomyopathies: Secondary | ICD-10-CM | POA: Insufficient documentation

## 2018-06-03 DIAGNOSIS — Z90710 Acquired absence of both cervix and uterus: Secondary | ICD-10-CM | POA: Diagnosis not present

## 2018-06-03 DIAGNOSIS — Z86711 Personal history of pulmonary embolism: Secondary | ICD-10-CM

## 2018-06-03 DIAGNOSIS — Z79899 Other long term (current) drug therapy: Secondary | ICD-10-CM | POA: Diagnosis not present

## 2018-06-03 DIAGNOSIS — R531 Weakness: Secondary | ICD-10-CM | POA: Diagnosis present

## 2018-06-03 DIAGNOSIS — I130 Hypertensive heart and chronic kidney disease with heart failure and stage 1 through stage 4 chronic kidney disease, or unspecified chronic kidney disease: Secondary | ICD-10-CM | POA: Diagnosis present

## 2018-06-03 DIAGNOSIS — F419 Anxiety disorder, unspecified: Secondary | ICD-10-CM

## 2018-06-03 DIAGNOSIS — K219 Gastro-esophageal reflux disease without esophagitis: Secondary | ICD-10-CM

## 2018-06-03 DIAGNOSIS — Z9882 Breast implant status: Secondary | ICD-10-CM | POA: Diagnosis not present

## 2018-06-03 DIAGNOSIS — I313 Pericardial effusion (noninflammatory): Secondary | ICD-10-CM | POA: Diagnosis not present

## 2018-06-03 DIAGNOSIS — Z823 Family history of stroke: Secondary | ICD-10-CM | POA: Diagnosis not present

## 2018-06-03 DIAGNOSIS — R911 Solitary pulmonary nodule: Secondary | ICD-10-CM | POA: Diagnosis present

## 2018-06-03 DIAGNOSIS — J439 Emphysema, unspecified: Secondary | ICD-10-CM | POA: Diagnosis present

## 2018-06-03 DIAGNOSIS — N170 Acute kidney failure with tubular necrosis: Secondary | ICD-10-CM | POA: Diagnosis not present

## 2018-06-03 DIAGNOSIS — Z885 Allergy status to narcotic agent status: Secondary | ICD-10-CM | POA: Diagnosis not present

## 2018-06-03 DIAGNOSIS — Z8673 Personal history of transient ischemic attack (TIA), and cerebral infarction without residual deficits: Secondary | ICD-10-CM | POA: Diagnosis not present

## 2018-06-03 DIAGNOSIS — R0602 Shortness of breath: Secondary | ICD-10-CM | POA: Diagnosis not present

## 2018-06-03 DIAGNOSIS — R079 Chest pain, unspecified: Secondary | ICD-10-CM | POA: Diagnosis not present

## 2018-06-03 DIAGNOSIS — I4819 Other persistent atrial fibrillation: Secondary | ICD-10-CM | POA: Diagnosis present

## 2018-06-03 DIAGNOSIS — Z950 Presence of cardiac pacemaker: Secondary | ICD-10-CM | POA: Diagnosis not present

## 2018-06-03 DIAGNOSIS — I5042 Chronic combined systolic (congestive) and diastolic (congestive) heart failure: Secondary | ICD-10-CM

## 2018-06-03 DIAGNOSIS — R5383 Other fatigue: Secondary | ICD-10-CM | POA: Diagnosis not present

## 2018-06-03 DIAGNOSIS — F329 Major depressive disorder, single episode, unspecified: Secondary | ICD-10-CM

## 2018-06-03 DIAGNOSIS — G40909 Epilepsy, unspecified, not intractable, without status epilepticus: Secondary | ICD-10-CM | POA: Diagnosis not present

## 2018-06-03 DIAGNOSIS — R002 Palpitations: Secondary | ICD-10-CM | POA: Diagnosis present

## 2018-06-03 DIAGNOSIS — I5023 Acute on chronic systolic (congestive) heart failure: Secondary | ICD-10-CM | POA: Diagnosis not present

## 2018-06-03 DIAGNOSIS — E785 Hyperlipidemia, unspecified: Secondary | ICD-10-CM

## 2018-06-03 DIAGNOSIS — Z7901 Long term (current) use of anticoagulants: Secondary | ICD-10-CM

## 2018-06-03 DIAGNOSIS — I4891 Unspecified atrial fibrillation: Secondary | ICD-10-CM | POA: Diagnosis not present

## 2018-06-03 DIAGNOSIS — I5043 Acute on chronic combined systolic (congestive) and diastolic (congestive) heart failure: Secondary | ICD-10-CM | POA: Diagnosis present

## 2018-06-03 DIAGNOSIS — Z48812 Encounter for surgical aftercare following surgery on the circulatory system: Secondary | ICD-10-CM | POA: Diagnosis not present

## 2018-06-03 DIAGNOSIS — I2729 Other secondary pulmonary hypertension: Secondary | ICD-10-CM | POA: Diagnosis present

## 2018-06-03 DIAGNOSIS — Z8249 Family history of ischemic heart disease and other diseases of the circulatory system: Secondary | ICD-10-CM | POA: Diagnosis not present

## 2018-06-03 DIAGNOSIS — Z888 Allergy status to other drugs, medicaments and biological substances status: Secondary | ICD-10-CM | POA: Diagnosis not present

## 2018-06-03 DIAGNOSIS — I5033 Acute on chronic diastolic (congestive) heart failure: Secondary | ICD-10-CM | POA: Diagnosis not present

## 2018-06-03 DIAGNOSIS — I481 Persistent atrial fibrillation: Secondary | ICD-10-CM | POA: Diagnosis present

## 2018-06-03 DIAGNOSIS — Z7722 Contact with and (suspected) exposure to environmental tobacco smoke (acute) (chronic): Secondary | ICD-10-CM | POA: Diagnosis present

## 2018-06-03 HISTORY — PX: AV NODE ABLATION: EP1193

## 2018-06-03 LAB — PROTIME-INR
INR: 1.68
Prothrombin Time: 19.7 seconds — ABNORMAL HIGH (ref 11.4–15.2)

## 2018-06-03 LAB — SURGICAL PCR SCREEN
MRSA, PCR: NEGATIVE
STAPHYLOCOCCUS AUREUS: NEGATIVE

## 2018-06-03 SURGERY — AV NODE ABLATION

## 2018-06-03 MED ORDER — BUPIVACAINE HCL (PF) 0.25 % IJ SOLN
INTRAMUSCULAR | Status: DC | PRN
  Administered 2018-06-03: 60 mL

## 2018-06-03 MED ORDER — FENTANYL CITRATE (PF) 100 MCG/2ML IJ SOLN
INTRAMUSCULAR | Status: AC
  Filled 2018-06-03: qty 2

## 2018-06-03 MED ORDER — SODIUM CHLORIDE 0.9 % IV SOLN: 250.0000 mL | INTRAVENOUS | Status: DC | PRN

## 2018-06-03 MED ORDER — METOPROLOL TARTRATE 50 MG PO TABS: 25.0000 mg | ORAL_TABLET | Freq: Two times a day (BID) | ORAL | 6 refills | Status: DC

## 2018-06-03 MED ORDER — DILTIAZEM HCL ER COATED BEADS 180 MG PO CP24: 180.0000 mg | ORAL_CAPSULE | Freq: Every day | ORAL | 6 refills | Status: DC

## 2018-06-03 MED ORDER — SODIUM CHLORIDE 0.9 % IV SOLN
INTRAVENOUS | Status: DC
  Administered 2018-06-03: 06:00:00 via INTRAVENOUS

## 2018-06-03 MED ORDER — BUPIVACAINE HCL (PF) 0.25 % IJ SOLN
INTRAMUSCULAR | Status: AC
  Filled 2018-06-03: qty 30

## 2018-06-03 MED ORDER — FENTANYL CITRATE (PF) 100 MCG/2ML IJ SOLN
INTRAMUSCULAR | Status: DC | PRN
  Administered 2018-06-03: 12.5 ug via INTRAVENOUS
  Administered 2018-06-03: 25 ug via INTRAVENOUS
  Administered 2018-06-03: 12.5 ug via INTRAVENOUS

## 2018-06-03 MED ORDER — MIDAZOLAM HCL 5 MG/5ML IJ SOLN
INTRAMUSCULAR | Status: DC | PRN
  Administered 2018-06-03 (×3): 1 mg via INTRAVENOUS
  Administered 2018-06-03: 0.5 mg via INTRAVENOUS

## 2018-06-03 MED ORDER — SODIUM CHLORIDE 0.9% FLUSH: 3.0000 mL | Freq: Two times a day (BID) | INTRAVENOUS | Status: DC

## 2018-06-03 MED ORDER — SODIUM CHLORIDE 0.9% FLUSH: 3.0000 mL | INTRAVENOUS | Status: DC | PRN

## 2018-06-03 MED ORDER — ONDANSETRON HCL 4 MG/2ML IJ SOLN: 4.0000 mg | Freq: Four times a day (QID) | INTRAMUSCULAR | Status: DC | PRN

## 2018-06-03 MED ORDER — MIDAZOLAM HCL 5 MG/5ML IJ SOLN
INTRAMUSCULAR | Status: AC
  Filled 2018-06-03: qty 5

## 2018-06-03 MED ORDER — ACETAMINOPHEN 325 MG PO TABS: 650.0000 mg | ORAL_TABLET | ORAL | Status: DC | PRN

## 2018-06-03 SURGICAL SUPPLY — 9 items
BAG SNAP BAND KOVER 36X36 (MISCELLANEOUS) ×3 IMPLANT
CATH BLAZER 7FR 4MM LG 5031TK2 (ABLATOR) ×3 IMPLANT
INTRODUCER SWARTZ SRO 8F (SHEATH) ×3 IMPLANT
PACK EP LATEX FREE (CUSTOM PROCEDURE TRAY) ×2
PACK EP LF (CUSTOM PROCEDURE TRAY) ×1 IMPLANT
PAD DEFIB LIFELINK (PAD) ×3 IMPLANT
SHEATH PINNACLE 8F 10CM (SHEATH) IMPLANT
SHEATH PINNACLE 9F 10CM (SHEATH) IMPLANT
SHIELD RADPAD SCOOP 12X17 (MISCELLANEOUS) ×3 IMPLANT

## 2018-06-03 NOTE — Progress Notes (Signed)
Per Dr Graciela Husbands pt to go back on usual dose of warfarin tonight, pt and husband verbalized understanding.

## 2018-06-03 NOTE — Discharge Instructions (Signed)
Cardiac Ablation, Care After °This sheet gives you information about how to care for yourself after your procedure. Your health care provider may also give you more specific instructions. If you have problems or questions, contact your health care provider. °What can I expect after the procedure? °After the procedure, it is common to have: °· Bruising around your puncture site. °· Tenderness around your puncture site. °· Skipped heartbeats. °· Tiredness (fatigue). ° °Follow these instructions at home: °Puncture site care °· Follow instructions from your health care provider about how to take care of your puncture site. Make sure you: °? Wash your hands with soap and water before you change your bandage (dressing). If soap and water are not available, use hand sanitizer. °? Change your dressing as told by your health care provider. °? Leave stitches (sutures), skin glue, or adhesive strips in place. These skin closures may need to stay in place for up to 2 weeks. If adhesive strip edges start to loosen and curl up, you may trim the loose edges. Do not remove adhesive strips completely unless your health care provider tells you to do that. °· Check your puncture site every day for signs of infection. Check for: °? Redness, swelling, or pain. °? Fluid or blood. If your puncture site starts to bleed, lie down on your back, apply firm pressure to the area, and contact your health care provider. °? Warmth. °? Pus or a bad smell. °Driving °· Ask your health care provider when it is safe for you to drive again after the procedure. °· Do not drive or use heavy machinery while taking prescription pain medicine. °· Do not drive for 24 hours if you were given a medicine to help you relax (sedative) during your procedure. °Activity °· Avoid activities that take a lot of effort for at least 3 days after your procedure. °· Do not lift anything that is heavier than 10 lb (4.5 kg), or the limit that you are told, until your health  care provider says that it is safe. °· Return to your normal activities as told by your health care provider. Ask your health care provider what activities are safe for you. °General instructions °· Take over-the-counter and prescription medicines only as told by your health care provider. °· Do not use any products that contain nicotine or tobacco, such as cigarettes and e-cigarettes. If you need help quitting, ask your health care provider. °· Do not take baths, swim, or use a hot tub until your health care provider approves. °· Do not drink alcohol for 24 hours after your procedure. °· Keep all follow-up visits as told by your health care provider. This is important. °Contact a health care provider if: °· You have redness, mild swelling, or pain around your puncture site. °· You have fluid or blood coming from your puncture site that stops after applying firm pressure to the area. °· Your puncture site feels warm to the touch. °· You have pus or a bad smell coming from your puncture site. °· You have a fever. °· You have chest pain or discomfort that spreads to your neck, jaw, or arm. °· You are sweating a lot. °· You feel nauseous. °· You have a fast or irregular heartbeat. °· You have shortness of breath. °· You are dizzy or light-headed and feel the need to lie down. °· You have pain or numbness in the arm or leg closest to your puncture site. °Get help right away if: °· Your puncture   site suddenly swells. °· Your puncture site is bleeding and the bleeding does not stop after applying firm pressure to the area. °These symptoms may represent a serious problem that is an emergency. Do not wait to see if the symptoms will go away. Get medical help right away. Call your local emergency services (911 in the [place] Do not drive yourself to the hospital. °Summary °· After the procedure, it is normal to have bruising and tenderness at the puncture site in your groin, neck, or forearm. °· Check your puncture site every  day for signs of infection. °· Get help right away if your puncture site is bleeding and the bleeding does not stop after applying firm pressure to the area. This is a medical emergency. °This information is not intended to replace advice given to you by your health care provider. Make sure you discuss any questions you have with your health care provider. °Document Released: 02/25/2017 Document Revised: 02/25/2017 Document Reviewed: 02/25/2017 °Elsevier Interactive Patient Education © 2018 [employer] ° °

## 2018-06-03 NOTE — Interval H&P Note (Signed)
History and Physical Interval Note:  06/03/2018 7:29 AM  Karen Richardson  has presented today for surgery, with the diagnosis of av  The various methods of treatment have been discussed with the patient and family. After consideration of risks, benefits and other options for treatment, the patient has consented to  Procedure(s): AV NODE ABLATION (N/A) as a surgical intervention .  The patient's history has been reviewed, patient examined, no change in status, stable for surgery.  I have reviewed the patient's chart and labs.  Questions were answered to the patient's satisfaction.     Sherryl Manges

## 2018-06-06 ENCOUNTER — Encounter: Payer: Self-pay | Admitting: Internal Medicine

## 2018-06-06 ENCOUNTER — Emergency Department (HOSPITAL_COMMUNITY): Payer: Medicare HMO

## 2018-06-06 ENCOUNTER — Telehealth: Payer: Self-pay | Admitting: Internal Medicine

## 2018-06-06 ENCOUNTER — Other Ambulatory Visit: Payer: Self-pay

## 2018-06-06 ENCOUNTER — Inpatient Hospital Stay (HOSPITAL_COMMUNITY)
Admission: EM | Admit: 2018-06-06 | Discharge: 2018-06-09 | DRG: 273 | Disposition: A | Discharge status: Home Health | Payer: Medicare HMO | Attending: Internal Medicine | Admitting: Internal Medicine

## 2018-06-06 ENCOUNTER — Encounter (HOSPITAL_COMMUNITY): Payer: Self-pay | Admitting: Internal Medicine

## 2018-06-06 DIAGNOSIS — G40909 Epilepsy, unspecified, not intractable, without status epilepticus: Secondary | ICD-10-CM | POA: Diagnosis not present

## 2018-06-06 DIAGNOSIS — R531 Weakness: Secondary | ICD-10-CM | POA: Diagnosis present

## 2018-06-06 DIAGNOSIS — Z5181 Encounter for therapeutic drug level monitoring: Secondary | ICD-10-CM

## 2018-06-06 DIAGNOSIS — Z823 Family history of stroke: Secondary | ICD-10-CM | POA: Diagnosis not present

## 2018-06-06 DIAGNOSIS — I5043 Acute on chronic combined systolic (congestive) and diastolic (congestive) heart failure: Secondary | ICD-10-CM | POA: Diagnosis present

## 2018-06-06 DIAGNOSIS — F419 Anxiety disorder, unspecified: Secondary | ICD-10-CM | POA: Diagnosis present

## 2018-06-06 DIAGNOSIS — E785 Hyperlipidemia, unspecified: Secondary | ICD-10-CM | POA: Diagnosis present

## 2018-06-06 DIAGNOSIS — Z79899 Other long term (current) drug therapy: Secondary | ICD-10-CM

## 2018-06-06 DIAGNOSIS — Z7722 Contact with and (suspected) exposure to environmental tobacco smoke (acute) (chronic): Secondary | ICD-10-CM | POA: Diagnosis present

## 2018-06-06 DIAGNOSIS — I481 Persistent atrial fibrillation: Secondary | ICD-10-CM | POA: Diagnosis present

## 2018-06-06 DIAGNOSIS — I2729 Other secondary pulmonary hypertension: Secondary | ICD-10-CM | POA: Diagnosis present

## 2018-06-06 DIAGNOSIS — R911 Solitary pulmonary nodule: Secondary | ICD-10-CM | POA: Diagnosis present

## 2018-06-06 DIAGNOSIS — Z90710 Acquired absence of both cervix and uterus: Secondary | ICD-10-CM

## 2018-06-06 DIAGNOSIS — Z86711 Personal history of pulmonary embolism: Secondary | ICD-10-CM | POA: Diagnosis not present

## 2018-06-06 DIAGNOSIS — I10 Essential (primary) hypertension: Secondary | ICD-10-CM | POA: Diagnosis present

## 2018-06-06 DIAGNOSIS — I5023 Acute on chronic systolic (congestive) heart failure: Secondary | ICD-10-CM | POA: Diagnosis not present

## 2018-06-06 DIAGNOSIS — J439 Emphysema, unspecified: Secondary | ICD-10-CM | POA: Diagnosis present

## 2018-06-06 DIAGNOSIS — R569 Unspecified convulsions: Secondary | ICD-10-CM | POA: Diagnosis present

## 2018-06-06 DIAGNOSIS — Z950 Presence of cardiac pacemaker: Secondary | ICD-10-CM | POA: Diagnosis not present

## 2018-06-06 DIAGNOSIS — K219 Gastro-esophageal reflux disease without esophagitis: Secondary | ICD-10-CM | POA: Diagnosis present

## 2018-06-06 DIAGNOSIS — Z8249 Family history of ischemic heart disease and other diseases of the circulatory system: Secondary | ICD-10-CM

## 2018-06-06 DIAGNOSIS — Z8673 Personal history of transient ischemic attack (TIA), and cerebral infarction without residual deficits: Secondary | ICD-10-CM | POA: Diagnosis not present

## 2018-06-06 DIAGNOSIS — I313 Pericardial effusion (noninflammatory): Secondary | ICD-10-CM | POA: Diagnosis not present

## 2018-06-06 DIAGNOSIS — I5033 Acute on chronic diastolic (congestive) heart failure: Secondary | ICD-10-CM | POA: Diagnosis not present

## 2018-06-06 DIAGNOSIS — Z9882 Breast implant status: Secondary | ICD-10-CM | POA: Diagnosis not present

## 2018-06-06 DIAGNOSIS — R002 Palpitations: Secondary | ICD-10-CM

## 2018-06-06 DIAGNOSIS — N170 Acute kidney failure with tubular necrosis: Secondary | ICD-10-CM | POA: Diagnosis not present

## 2018-06-06 DIAGNOSIS — Z48812 Encounter for surgical aftercare following surgery on the circulatory system: Secondary | ICD-10-CM | POA: Diagnosis not present

## 2018-06-06 DIAGNOSIS — I482 Chronic atrial fibrillation, unspecified: Secondary | ICD-10-CM | POA: Diagnosis present

## 2018-06-06 DIAGNOSIS — F329 Major depressive disorder, single episode, unspecified: Secondary | ICD-10-CM | POA: Diagnosis present

## 2018-06-06 DIAGNOSIS — I130 Hypertensive heart and chronic kidney disease with heart failure and stage 1 through stage 4 chronic kidney disease, or unspecified chronic kidney disease: Principal | ICD-10-CM | POA: Diagnosis present

## 2018-06-06 DIAGNOSIS — Z8719 Personal history of other diseases of the digestive system: Secondary | ICD-10-CM

## 2018-06-06 DIAGNOSIS — Z888 Allergy status to other drugs, medicaments and biological substances status: Secondary | ICD-10-CM

## 2018-06-06 DIAGNOSIS — Z885 Allergy status to narcotic agent status: Secondary | ICD-10-CM | POA: Diagnosis not present

## 2018-06-06 DIAGNOSIS — N183 Chronic kidney disease, stage 3 unspecified: Secondary | ICD-10-CM | POA: Diagnosis present

## 2018-06-06 DIAGNOSIS — Z7901 Long term (current) use of anticoagulants: Secondary | ICD-10-CM

## 2018-06-06 HISTORY — DX: Other specified postprocedural states: Z98.890

## 2018-06-06 HISTORY — DX: Nausea with vomiting, unspecified: R11.2

## 2018-06-06 HISTORY — DX: Migraine, unspecified, not intractable, without status migrainosus: G43.909

## 2018-06-06 HISTORY — DX: Acute on chronic diastolic (congestive) heart failure: I50.33

## 2018-06-06 HISTORY — DX: Presence of cardiac pacemaker: Z95.0

## 2018-06-06 HISTORY — DX: Other generalized epilepsy and epileptic syndromes, not intractable, without status epilepticus: G40.409

## 2018-06-06 LAB — CBC WITH DIFFERENTIAL/PLATELET
ABS IMMATURE GRANULOCYTES: 0 10*3/uL (ref 0.0–0.1)
Basophils Absolute: 0 10*3/uL (ref 0.0–0.1)
Basophils Relative: 1 %
Eosinophils Absolute: 0.1 10*3/uL (ref 0.0–0.7)
Eosinophils Relative: 1 %
HCT: 41.3 % (ref 36.0–46.0)
HEMOGLOBIN: 12.7 g/dL (ref 12.0–15.0)
Immature Granulocytes: 0 %
LYMPHS ABS: 1.7 10*3/uL (ref 0.7–4.0)
Lymphocytes Relative: 30 %
MCH: 30.4 pg (ref 26.0–34.0)
MCHC: 30.8 g/dL (ref 30.0–36.0)
MCV: 98.8 fL (ref 78.0–100.0)
MONO ABS: 0.8 10*3/uL (ref 0.1–1.0)
Monocytes Relative: 14 %
NEUTROS ABS: 3 10*3/uL (ref 1.7–7.7)
Neutrophils Relative %: 54 %
Platelets: 145 10*3/uL — ABNORMAL LOW (ref 150–400)
RBC: 4.18 MIL/uL (ref 3.87–5.11)
RDW: 14.3 % (ref 11.5–15.5)
WBC: 5.5 10*3/uL (ref 4.0–10.5)

## 2018-06-06 LAB — BASIC METABOLIC PANEL
Anion gap: 14 (ref 5–15)
BUN: 22 mg/dL (ref 8–23)
CHLORIDE: 106 mmol/L (ref 98–111)
CO2: 21 mmol/L — AB (ref 22–32)
CREATININE: 1.38 mg/dL — AB (ref 0.44–1.00)
Calcium: 8.8 mg/dL — ABNORMAL LOW (ref 8.9–10.3)
GFR calc Af Amer: 40 mL/min — ABNORMAL LOW (ref >60)
GFR calc non Af Amer: 35 mL/min — ABNORMAL LOW (ref >60)
Glucose, Bld: 96 mg/dL (ref 70–99)
Potassium: 4.2 mmol/L (ref 3.5–5.1)
Sodium: 141 mmol/L (ref 135–145)

## 2018-06-06 LAB — PROTIME-INR
INR: 1.68
PROTHROMBIN TIME: 19.7 s — AB (ref 11.4–15.2)

## 2018-06-06 LAB — TROPONIN I: Troponin I: 0.1 ng/mL (ref <0.03)

## 2018-06-06 LAB — BRAIN NATRIURETIC PEPTIDE: B Natriuretic Peptide: 1222.1 pg/mL — ABNORMAL HIGH (ref 0.0–100.0)

## 2018-06-06 MED ORDER — MIRTAZAPINE 15 MG PO TABS
15.0000 mg | ORAL_TABLET | Freq: Every day | ORAL | Status: DC
  Administered 2018-06-06 – 2018-06-08 (×3): 15 mg via ORAL
  Filled 2018-06-06 (×3): qty 1

## 2018-06-06 MED ORDER — SODIUM CHLORIDE 0.9% FLUSH
3.0000 mL | Freq: Two times a day (BID) | INTRAVENOUS | Status: DC
  Administered 2018-06-06 – 2018-06-08 (×5): 3 mL via INTRAVENOUS

## 2018-06-06 MED ORDER — FUROSEMIDE 40 MG PO TABS
40.0000 mg | ORAL_TABLET | Freq: Every day | ORAL | Status: DC
  Administered 2018-06-07: 40 mg via ORAL
  Filled 2018-06-06: qty 1

## 2018-06-06 MED ORDER — WARFARIN - PHARMACIST DOSING INPATIENT
Freq: Every day | Status: DC
  Administered 2018-06-07 – 2018-06-08 (×2)

## 2018-06-06 MED ORDER — ACETAMINOPHEN 325 MG PO TABS: 650.0000 mg | ORAL_TABLET | ORAL | Status: DC | PRN

## 2018-06-06 MED ORDER — ONDANSETRON HCL 4 MG/2ML IJ SOLN
4.0000 mg | Freq: Four times a day (QID) | INTRAMUSCULAR | Status: DC | PRN
  Administered 2018-06-07: 4 mg via INTRAVENOUS
  Filled 2018-06-06: qty 2

## 2018-06-06 MED ORDER — METOPROLOL TARTRATE 25 MG PO TABS
25.0000 mg | ORAL_TABLET | Freq: Two times a day (BID) | ORAL | Status: DC
Start: 2018-06-06 — End: 2018-06-08
  Administered 2018-06-06 – 2018-06-08 (×4): 25 mg via ORAL
  Filled 2018-06-06 (×4): qty 1

## 2018-06-06 MED ORDER — PANTOPRAZOLE SODIUM 40 MG PO TBEC
40.0000 mg | DELAYED_RELEASE_TABLET | Freq: Every day | ORAL | Status: DC
  Administered 2018-06-07 – 2018-06-09 (×3): 40 mg via ORAL
  Filled 2018-06-06 (×3): qty 1

## 2018-06-06 MED ORDER — DILTIAZEM HCL ER COATED BEADS 180 MG PO CP24
180.0000 mg | ORAL_CAPSULE | Freq: Every day | ORAL | Status: DC
  Administered 2018-06-07 – 2018-06-08 (×2): 180 mg via ORAL
  Filled 2018-06-06 (×2): qty 1

## 2018-06-06 MED ORDER — POTASSIUM CHLORIDE CRYS ER 20 MEQ PO TBCR
20.0000 meq | EXTENDED_RELEASE_TABLET | Freq: Every day | ORAL | Status: DC
  Administered 2018-06-06 – 2018-06-09 (×4): 20 meq via ORAL
  Filled 2018-06-06 (×4): qty 1

## 2018-06-06 MED ORDER — SODIUM CHLORIDE 0.9 % IV SOLN: 250.0000 mL | INTRAVENOUS | Status: DC | PRN

## 2018-06-06 MED ORDER — SODIUM CHLORIDE 0.9% FLUSH: 3.0000 mL | INTRAVENOUS | Status: DC | PRN

## 2018-06-06 MED ORDER — CARBAMAZEPINE ER 100 MG PO TB12
100.0000 mg | ORAL_TABLET | Freq: Two times a day (BID) | ORAL | Status: DC
  Administered 2018-06-06 – 2018-06-09 (×6): 100 mg via ORAL
  Filled 2018-06-06 (×6): qty 1

## 2018-06-06 MED ORDER — FUROSEMIDE 10 MG/ML IJ SOLN
60.0000 mg | Freq: Once | INTRAMUSCULAR | Status: AC
  Administered 2018-06-06: 60 mg via INTRAVENOUS
  Filled 2018-06-06: qty 6

## 2018-06-06 MED ORDER — WARFARIN SODIUM 1 MG PO TABS
1.0000 mg | ORAL_TABLET | Freq: Once | ORAL | Status: AC
  Administered 2018-06-06: 1 mg via ORAL
  Filled 2018-06-06: qty 1

## 2018-06-06 MED ORDER — ATORVASTATIN CALCIUM 10 MG PO TABS
10.0000 mg | ORAL_TABLET | Freq: Every evening | ORAL | Status: DC
  Administered 2018-06-06 – 2018-06-08 (×3): 10 mg via ORAL
  Filled 2018-06-06 (×3): qty 1

## 2018-06-06 NOTE — Progress Notes (Addendum)
ANTICOAGULATION CONSULT NOTE - Initial Consult  Pharmacy Consult for warfarin Indication: atrial fibrillation and pulmonary embolus (hx)  Allergies  Allergen Reactions  . Codeine Nausea Only  . Amiodarone Other (See Comments)    Neuropathy and skin discoloration     Patient Measurements: Height: 5\' 5"  (165.1 cm) Weight: 164 lb (74.4 kg) IBW/kg (Calculated) : 57 Heparin Dosing Weight: 72.2 kg  Vital Signs: Temp: 97.7 F (36.5 C) (07/08 1248) Temp Source: Oral (07/08 1248) BP: 115/74 (07/08 2101) Pulse Rate: 90 (07/08 2101)  Labs: Recent Labs    06/06/18 1515  HGB 12.7  HCT 41.3  PLT 145*  CREATININE 1.38*  TROPONINI 0.10*    Estimated Creatinine Clearance: 31.8 mL/min (A) (by C-G formula based on SCr of 1.38 mg/dL (H)).   Medical History: Past Medical History:  Diagnosis Date  . Acute on chronic diastolic (congestive) heart failure (HCC)   . Anxiety   . Atrial fibrillation with RVR (HCC) 12/01/2016  . CKD (chronic kidney disease), stage III (HCC) 12/12/2016  . Depression   . Dysphagia   . GERD (gastroesophageal reflux disease) 12/01/2016  . Heart murmur   . History of esophageal stricture 12/10/2016  . Hyperlipidemia   . Hypertension    x 4 months  . Presbyesophagus 12/11/2016  . Pulmonary emboli (HCC) 12/01/2016  . Pulmonary embolism (HCC)   . Pulmonary emphysema (HCC) 09/06/2017  . Pulmonary nodule   . Seizures (HCC)   . Stroke [facility])     Medications:  Scheduled:  . atorvastatin  10 mg Oral QPM  . carbamazepine  100 mg Oral BID  . [START ON 06/07/2018] diltiazem  180 mg Oral Daily  . [START ON 06/07/2018] furosemide  40 mg Oral Daily  . metoprolol tartrate  25 mg Oral BID  . mirtazapine  15 mg Oral QHS  . [START ON 06/07/2018] pantoprazole  40 mg Oral Daily  . potassium chloride  20 mEq Oral Daily  . sodium chloride flush  3 mL Intravenous Q12H    Assessment: 84 yo female on chronic Coumadin for hx afib and PE admitted with issue with pacemaker.  Pharmacy  asked to continue coumadin.  Per patient report she last took a dose of Coumadin this AM.  INR from 7/5 was 1.68.  Current INR pending.   PTA warfarin dose: 3.75 mg on MWFSa, 2.5 mg TTSu  Goal of Therapy:  INR 2-3 Monitor platelets by anticoagulation protocol: Yes   Plan:  1. F/u current INR.  As long as within range, no additional coumadin tonight. 2. Daily INR.  Jenetta Downer, [facility] Clinical Pharmacist Phone [phone]  06/06/2018 9:35 PM   Addendum:  INR 1.68 - will give additional 1 mg supplemental dose tonight.  Jenetta Downer, [facility] Clinical Pharmacist Phone [phone]  06/06/2018 10:18 PM

## 2018-06-06 NOTE — Telephone Encounter (Signed)
New Message   Pt c/o swelling: STAT is pt has developed SOB within 24 hours  1) How much weight have you gained and in what time span? 3lbs since Saturday  2) If swelling, where is the swelling located? stomach  3) Are you currently taking a fluid pill? yes  4) Are you currently SOB? yes  5) Do you have a log of your daily weights (if so, list)? Not with her  6) Have you gained 3 pounds in a day or 5 pounds in a week? yes  7) Have you traveled recently? No, but just had an ablation 7/5

## 2018-06-06 NOTE — Patient Outreach (Signed)
[facility]) Care Management  06/06/2018  TANEYA VALOIS Aug 11, 1934 [id number]  EMMI:heart failure red alert Referral date:05/23/18, 05/24/18. 05/26/18. 05/30/18, 06/06/18 Referral reason:went to follow up appointment, Weight 169lbs, New worsening problems: yes, tired/ fatigued: yes, Lightheaded or dizzy: yes., Any new problems: yes, New swelling: yes Insurance: medicare Day #8and 11, 41, 23  Telephone call to patient regarding EMMI heart failure red alert. Unable to reach patient. HIPAA compliant voice message left with call back phone number.   PLAN; Several call attempts made to patient without success. If not return call will proceed with closure.  [name] Ina RN,BSN,CCM [facility] Telephonic  [phone]

## 2018-06-06 NOTE — Progress Notes (Signed)
Patient gives permission to give information two daughters. Karen Richardson and Karen Richardson.

## 2018-06-06 NOTE — ED Notes (Signed)
Spoke w/ cardiology in regards to pt's elevated troponin

## 2018-06-06 NOTE — Plan of Care (Signed)
  Problem: Education: Goal: Knowledge of General Education information will improve Outcome: Progressing   Problem: Health Behavior/Discharge Planning: Goal: Ability to manage health-related needs will improve Outcome: Progressing   

## 2018-06-06 NOTE — ED Triage Notes (Signed)
Husband stated, shes having the same symptoms as 3 weeks ago with being tired and SOB and no matter what I do. Friday had a cardiac procedure???? shes complaining of feeling like I have some type of jerking all inside of me , tired all over can't do anything without being SOB.

## 2018-06-06 NOTE — ED Notes (Signed)
Pt went to walk to the bathroom, when pt came out of bathroom, pt was short of breath and dizzy. Sat pt down in chair, then pt wheeled back to the room. Hannie-RN informed.

## 2018-06-06 NOTE — Telephone Encounter (Signed)
Called to follow up on call. Called to speak to pt and person answering informs me that pt spoke to [facility], this morning, who advised they go to the hospital.  Pt refused to go via ambulance, so pt's husband is driving her to [facility]. Will have Lorren, RN follow up later today with hospital/pt/family.
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
## 2018-06-06 NOTE — ED Notes (Signed)
Spoke w/ lab regarding status of pt's blood work; was told bylab that they have received the blood work and are about to run it

## 2018-06-06 NOTE — ED Provider Notes (Addendum)
[facility] EMERGENCY DEPARTMENT Provider Note   CSN: [id number] Arrival date & time: 06/06/18  1241     History   Chief Complaint Chief Complaint  Patient presents with  . Weakness    "I feel tired all over"  . Shortness of Breath    HPI Karen Richardson is a 84 y.o. female.  HPI  84 year old female with a history of atrial fibrillation and CHF with recent pacemaker placement 1 month ago and ablation a few days ago presents with weakness, shortness of breath, chest pressure and palpitations.  Similar symptoms that led to these procedures.  She also feels like her abdomen has been swelling over the last couple days.  The symptoms are worse since yesterday.  Called the cardiology office and was told to come here.  States they were told they would see Dr. Graciela Husbands.  Shortness of breath is mostly with exertion only.  Her weight is 164 pounds which she states is gained 2 pounds since yesterday.  Past Medical History:  Diagnosis Date  . Acute on chronic diastolic (congestive) heart failure (HCC)   . Anxiety   . Atrial fibrillation with RVR (HCC) 12/01/2016  . CKD (chronic kidney disease), stage III (HCC) 12/12/2016  . Depression   . Dysphagia   . GERD (gastroesophageal reflux disease) 12/01/2016  . Heart murmur   . History of esophageal stricture 12/10/2016  . Hyperlipidemia   . Hypertension    x 4 months  . Presbyesophagus 12/11/2016  . Pulmonary emboli (HCC) 12/01/2016  . Pulmonary embolism (HCC)   . Pulmonary emphysema (HCC) 09/06/2017  . Pulmonary nodule   . Seizures (HCC)   . Stroke [facility])     Patient Active Problem List   Diagnosis Date Noted  . Persistent atrial fibrillation (HCC) 05/19/2018  . Acute on chronic diastolic CHF (congestive heart failure) (HCC) 05/17/2018  . Acute on chronic diastolic (congestive) heart failure (HCC)   . Other social stressor 12/17/2017  . Pulmonary emphysema (HCC) 09/06/2017  . Restrictive lung disease 07/02/2017  . Other  secondary pulmonary hypertension (HCC) 07/02/2017  . Monitoring for long-term anticoagulant use 12/18/2016  . CKD (chronic kidney disease), stage III (HCC) 12/12/2016  . Seizure disorder (HCC) 12/12/2016  . Presbyesophagus 12/11/2016  . History of esophageal stricture 12/10/2016  . Pulmonary emboli (HCC) 12/01/2016  . GERD (gastroesophageal reflux disease) 12/01/2016  . Chronic atrial fibrillation (HCC)   . Hypertension 09/22/2011    Past Surgical History:  Procedure Laterality Date  . ABDOMINAL HYSTERECTOMY    . AV NODE ABLATION N/A 06/03/2018   Procedure: AV NODE ABLATION;  Surgeon: Duke Salvia, MD;  Location: [facility] INVASIVE CV LAB;  Service: Cardiovascular;  Laterality: N/A;  . BREAST ENHANCEMENT SURGERY     40 yrs ago  . CARDIOVERSION N/A 05/03/2017   Procedure: CARDIOVERSION;  Surgeon: Elease Hashimoto Deloris Ping, MD;  Location: [facility] ENDOSCOPY;  Service: Cardiovascular;  Laterality: N/A;  . ESOPHAGEAL DILATION N/A 03/17/2017   Procedure: ESOPHAGEAL DILATION;  Surgeon: Malissa Hippo, MD;  Location[facility] ENDO SUITE;  Service: Endoscopy;  Laterality: N/A;  . ESOPHAGOGASTRODUODENOSCOPY N/A 03/17/2017   Procedure: ESOPHAGOGASTRODUODENOSCOPY (EGD);  Surgeon: Malissa Hippo, MD;  Location[facility] ENDO SUITE;  Service: Endoscopy;  Laterality: N/A;  10:30  . PACEMAKER IMPLANT N/A 05/09/2018   Procedure: PACEMAKER IMPLANT;  Surgeon: Duke Salvia, MD;  Location: [facility] INVASIVE CV LAB;  Service: Cardiovascular;  Laterality: N/A;  . PARTIAL HYSTERECTOMY    . SPINE SURGERY  Tumor removal  . TEE WITHOUT CARDIOVERSION N/A 05/06/2018   Procedure: TRANSESOPHAGEAL ECHOCARDIOGRAM (TEE);  Surgeon: Pricilla Riffle, MD;  Location: [facility] ENDOSCOPY;  Service: Cardiovascular;  Laterality: N/A;  . Tumor removed from spinal cord     benign     OB History    Gravida  4   Para  4   Term  4   Preterm      AB      Living  2     SAB      TAB      Ectopic      Multiple      Live Births               Home  Medications    Prior to Admission medications   Medication Sig Start Date End Date Taking? Authorizing Provider  atorvastatin (LIPITOR) 10 MG tablet TAKE ONE TABLET BY MOUTH EVERY EVENING. 01/11/18   Laqueta Linden, MD  carbamazepine (TEGRETOL XR) 100 MG 12 hr tablet Take 1 tablet by mouth twice daily (morning, bedtime) Patient taking differently: Take 100 mg by mouth 2 (two) times daily.  05/04/18   Aliene Beams, MD  diltiazem (CARDIZEM CD) 180 MG 24 hr capsule Take 1 capsule (180 mg total) by mouth daily. 06/03/18   Duke Salvia, MD  furosemide (LASIX) 40 MG tablet Take 1 tablet (40 mg total) by mouth daily. 05/20/18   Leone Brand, NP  metoprolol tartrate (LOPRESSOR) 50 MG tablet Take 0.5 tablets (25 mg total) by mouth 2 (two) times daily. 06/03/18   Duke Salvia, MD  mirtazapine (REMERON) 15 MG tablet Take 1 tablet (15 mg total) by mouth at bedtime. 05/27/18   Aliene Beams, MD  pantoprazole (PROTONIX) 40 MG tablet Take 40 mg by mouth daily.    [provider]  potassium chloride (K-DUR,KLOR-CON) 10 MEQ tablet TAKE ONE TABLET BY MOUTH TWICE DAILY. (MORNING ,BEDTIME) 04/05/18   Laqueta Linden, MD  Vitamin D, Ergocalciferol, (DRISDOL) 50000 units CAPS capsule TAKE ONE CAPSULE BY MOUTH EVERY 7 DAYS (FRIDAY MORNING) 08/18/17   Eustace Moore, MD  warfarin (COUMADIN) 2.5 MG tablet TAKE AS DIRECTED BY THE ANTICOAGULATION CLINIC Patient taking differently: Take 3.75 mg by mouth daily on Monday, Wednesday, Friday and Saturday. Take 2.5 mg by mouth daily on all other days 05/20/18   Laqueta Linden, MD    Family History Family History  Problem Relation Age of Onset  . Pulmonary embolism Mother   . Colon cancer Mother        mass in colon  . Stroke Father   . Alcohol abuse Father   . CVA Father   . COPD Father   . Diabetes Sister   . Heart failure Sister   . Brain cancer Sister   . Pancreatic cancer Sister   . Epilepsy Sister   . Schizophrenia Brother   .  Alcohol abuse Brother   . Lung disease Neg Hx     Social History Social History   Tobacco Use  . Smoking status: Passive Smoke Exposure - Never Smoker  . Smokeless tobacco: Never Used  . Tobacco comment: Father as a child   Substance Use Topics  . Alcohol use: No  . Drug use: No     Allergies   Codeine and Amiodarone   Review of Systems Review of Systems  Constitutional: Positive for fatigue.  Respiratory: Positive for shortness of breath.   Cardiovascular: Positive for chest pain.  Negative for leg swelling.  Gastrointestinal: Positive for abdominal distention. Negative for abdominal pain.  Neurological: Positive for weakness.  All other systems reviewed and are negative.    Physical Exam Updated Vital Signs BP 129/84   Pulse 90   Temp 97.7 F (36.5 C) (Oral)   Resp (!) 21   Ht 5\' 5"  (1.651 m)   Wt 74.4 kg (164 lb)   SpO2 93%   BMI 27.29 kg/m   Physical Exam  Constitutional: She is oriented to person, place, and time. She appears well-developed and well-nourished.  Non-toxic appearance. She does not appear ill. No distress.  HENT:  Head: Normocephalic and atraumatic.  Right Ear: External ear normal.  Left Ear: External ear normal.  Nose: Nose normal.  Eyes: Right eye exhibits no discharge. Left eye exhibits no discharge.  Cardiovascular: Normal rate, regular rhythm and normal heart sounds.  Pulmonary/Chest: Effort normal and breath sounds normal. She has no decreased breath sounds. She has no wheezes. She has no rales.  Abdominal: Soft. She exhibits no distension. There is no tenderness.  Musculoskeletal:       Right lower leg: She exhibits no edema.       Left lower leg: She exhibits no edema.  Neurological: She is alert and oriented to person, place, and time.  Skin: Skin is warm and dry.  Pulsations noted at bilateral inferior ribs c/w diaphragm being stimulated.  Nursing note and vitals reviewed.    ED Treatments / Results  Labs (all labs  ordered are listed, but only abnormal results are displayed) Labs Reviewed  BASIC METABOLIC PANEL  BRAIN NATRIURETIC PEPTIDE  TROPONIN I  CBC WITH DIFFERENTIAL/PLATELET    EKG EKG Interpretation  Date/Time:  Monday June 06 2018 15:00:19 EDT Ventricular Rate:  90 PR Interval:    QRS Duration: 177 QT Interval:  468 QTC Calculation: 573 R Axis:   -82 Text Interpretation:  Sinus rhythm Left atrial enlargement IVCD, consider atypical RBBB LVH with IVCD and secondary repol abnrm Prolonged QT interval no significant change since earlier in the day Confirmed by Pricilla Loveless [phone]) on 06/06/2018 3:16:05 PM   Radiology Dg Chest 2 View  Result Date: 06/06/2018 CLINICAL DATA:  Pacemaker.  Shortness of breath. EXAM: CHEST - 2 VIEW COMPARISON:  Chest radiograph 05/17/2018. FINDINGS: Marked enlargement of the cardiac silhouette. No consolidation or edema. No pneumothorax. Single lead pacer LEFT subclavian approach, appears unchanged from the June radiograph. Skeletal osteopenia is noted. IMPRESSION: Satisfactory pacemaker appearance. No pneumothorax, consolidation, or edema. Cardiomegaly. Electronically Signed   By: Elsie Stain M.D.   On: 06/06/2018 16:07    Procedures Procedures (including critical care time)  Medications Ordered in ED Medications - No data to display   Initial Impression / Assessment and Plan / ED Course  I have reviewed the triage vital signs and the nursing notes.  Pertinent labs & imaging results that were available during my care of the patient were reviewed by me and considered in my medical decision making (see chart for details).     Cardiology has been consulted given patient's various complaints.  She does appear to have pulsations that could be from overestimation from her pacemaker.  Currently lab work is pending.  Chest x-ray unremarkable.  She is currently stable while awaiting cards consult. Care transferred to Dr. Wilkie Aye.  Final Clinical Impressions(s)  / ED Diagnoses   Final diagnoses:  None    ED Discharge Orders    None  Pricilla Loveless, MD 06/06/18 1610    Pricilla Loveless, MD 06/06/18 [phone]

## 2018-06-07 ENCOUNTER — Encounter: Payer: Self-pay | Admitting: Internal Medicine

## 2018-06-07 ENCOUNTER — Telehealth: Payer: Self-pay | Admitting: Cardiovascular Disease

## 2018-06-07 DIAGNOSIS — N170 Acute kidney failure with tubular necrosis: Secondary | ICD-10-CM

## 2018-06-07 DIAGNOSIS — N183 Chronic kidney disease, stage 3 (moderate): Secondary | ICD-10-CM

## 2018-06-07 DIAGNOSIS — I5033 Acute on chronic diastolic (congestive) heart failure: Secondary | ICD-10-CM

## 2018-06-07 LAB — BASIC METABOLIC PANEL
ANION GAP: 5 (ref 5–15)
BUN: 24 mg/dL — AB (ref 8–23)
CALCIUM: 8.8 mg/dL — AB (ref 8.9–10.3)
CO2: 29 mmol/L (ref 22–32)
Chloride: 107 mmol/L (ref 98–111)
Creatinine, Ser: 1.5 mg/dL — ABNORMAL HIGH (ref 0.44–1.00)
GFR calc Af Amer: 36 mL/min — ABNORMAL LOW (ref >60)
GFR calc non Af Amer: 31 mL/min — ABNORMAL LOW (ref >60)
GLUCOSE: 106 mg/dL — AB (ref 70–99)
Potassium: 4.3 mmol/L (ref 3.5–5.1)
SODIUM: 141 mmol/L (ref 135–145)

## 2018-06-07 LAB — CBC
HCT: 37.6 % (ref 36.0–46.0)
Hemoglobin: 11.9 g/dL — ABNORMAL LOW (ref 12.0–15.0)
MCH: 30.7 pg (ref 26.0–34.0)
MCHC: 31.6 g/dL (ref 30.0–36.0)
MCV: 97.2 fL (ref 78.0–100.0)
PLATELETS: 135 10*3/uL — AB (ref 150–400)
RBC: 3.87 MIL/uL (ref 3.87–5.11)
RDW: 14.4 % (ref 11.5–15.5)
WBC: 5.8 10*3/uL (ref 4.0–10.5)

## 2018-06-07 LAB — PROTIME-INR
INR: 1.75
PROTHROMBIN TIME: 20.3 s — AB (ref 11.4–15.2)

## 2018-06-07 MED ORDER — WARFARIN SODIUM 7.5 MG PO TABS
3.7500 mg | ORAL_TABLET | Freq: Once | ORAL | Status: AC
  Administered 2018-06-07: 3.75 mg via ORAL
  Filled 2018-06-07: qty 0.5

## 2018-06-07 MED ORDER — FUROSEMIDE 10 MG/ML IJ SOLN
80.0000 mg | Freq: Two times a day (BID) | INTRAMUSCULAR | Status: AC
  Administered 2018-06-08: 80 mg via INTRAVENOUS
  Filled 2018-06-07: qty 8

## 2018-06-07 MED ORDER — FUROSEMIDE 10 MG/ML IJ SOLN
40.0000 mg | Freq: Two times a day (BID) | INTRAMUSCULAR | Status: DC
  Filled 2018-06-07: qty 4

## 2018-06-07 NOTE — Plan of Care (Signed)
  Problem: Activity: Goal: Risk for activity intolerance will decrease Outcome: Progressing   Problem: Safety: Goal: Ability to remain free from injury will improve Outcome: Progressing   

## 2018-06-07 NOTE — Telephone Encounter (Signed)
Per daughter patient is in [place].  She would like for you to call her with PT/INR if it has been done in the hospital

## 2018-06-07 NOTE — Progress Notes (Addendum)
ANTICOAGULATION CONSULT NOTE - Initial Consult  Pharmacy Consult for warfarin Indication: atrial fibrillation and pulmonary embolus (hx)  Allergies  Allergen Reactions   Codeine Nausea Only   Amiodarone Other (See Comments)    Neuropathy and skin discoloration     Patient Measurements: Height: 5\' 5"  (165.1 cm) Weight: 163 lb 0.6 oz (74 kg)(scale a) IBW/kg (Calculated) : 57 Heparin Dosing Weight: 72.2 kg  Vital Signs: Temp: 97.3 F (36.3 C) (07/09 1128) Temp Source: Oral (07/09 1128) BP: 114/83 (07/09 1128) Pulse Rate: 79 (07/09 1128)  Labs: Recent Labs    06/06/18 1515 06/06/18 2149 06/07/18 0614  HGB 12.7  --  11.9*  HCT 41.3  --  37.6  PLT 145*  --  135*  LABPROT  --  19.7* 20.3*  INR  --  1.68 1.75  CREATININE 1.38*  --  1.50*  TROPONINI 0.10*  --   --     Estimated Creatinine Clearance: 29.1 mL/min (A) (by C-G formula based on SCr of 1.5 mg/dL (H)).   Medical History: Past Medical History:  Diagnosis Date   Acute on chronic diastolic (congestive) heart failure (HCC)    Anxiety    Atrial fibrillation with RVR (HCC) 12/01/2016   CKD (chronic kidney disease), stage III (HCC) 12/12/2016   Depression    Dysphagia    GERD (gastroesophageal reflux disease) 12/01/2016   Heart murmur    History of esophageal stricture 12/10/2016   Hyperlipidemia    Hypertension    x 4 months   Presbyesophagus 12/11/2016   Pulmonary emboli (HCC) 12/01/2016   Pulmonary embolism (HCC)    Pulmonary emphysema (HCC) 09/06/2017   Pulmonary nodule    Seizures (HCC)    Stroke (HCC)     Medications:  Scheduled:   atorvastatin  10 mg Oral QPM   carbamazepine  100 mg Oral BID   diltiazem  180 mg Oral Daily   furosemide  40 mg Intravenous Q12H   metoprolol tartrate  25 mg Oral BID   mirtazapine  15 mg Oral QHS   pantoprazole  40 mg Oral Daily   potassium chloride  20 mEq Oral Daily   sodium chloride flush  3 mL Intravenous Q12H   Warfarin - Pharmacist  Dosing Inpatient   Does not apply q1800    Assessment: 84 yo female on chronic Coumadin for hx afib and PE admitted with issue with pacemaker.  Pharmacy asked to continue coumadin.  Per patient report she last took a dose of Coumadin yesterday prior to admission and was given an additional 1mg  for total of 4.75mg  dose.  INR up to 1.75 today from 1.68. Noted DDI with carbamazepine (PTA med).   PTA warfarin dose: 3.75 mg on MWFSa, 2.5 mg TTSu   Goal of Therapy:  INR 2-3 Monitor platelets by anticoagulation protocol: Yes   Plan:  1. Warfarin 3.75mg  tonight 2. Daily INR. 3. Monitor s/sx bleeding  Lenward Chancellor, PharmD PGY1 Pharmacy Resident Phone [phone] 06/07/2018  11:54 AM

## 2018-06-07 NOTE — Progress Notes (Signed)
Progress Note  Patient Name: Karen Richardson Date of Encounter: 06/07/2018  Primary Cardiologist:   Primary Electrophysiologist: SK   Patient Profile     84 y.o. female admitted with acute chronic CHF following av nodal ablation last week for uncotrolled Afib and 2/2 rate related myopathy  Some diaphragmatic stim from what GT thinks may be Middle cardiac vein lead placement  Subjective   Some what better   Inpatient Medications    Scheduled Meds: . atorvastatin  10 mg Oral QPM  . carbamazepine  100 mg Oral BID  . diltiazem  180 mg Oral Daily  . furosemide  40 mg Intravenous Q12H  . metoprolol tartrate  25 mg Oral BID  . mirtazapine  15 mg Oral QHS  . pantoprazole  40 mg Oral Daily  . potassium chloride  20 mEq Oral Daily  . sodium chloride flush  3 mL Intravenous Q12H  . warfarin  3.75 mg Oral ONCE-1800  . Warfarin - Pharmacist Dosing Inpatient   Does not apply q1800   Continuous Infusions: . sodium chloride     PRN Meds: sodium chloride, acetaminophen, ondansetron (ZOFRAN) IV, sodium chloride flush   Vital Signs    Vitals:   06/07/18 0428 06/07/18 0800 06/07/18 0937 06/07/18 1128  BP: 131/79 123/80 123/80 114/83  Pulse: 90 90 80 79  Resp: 18 16  18   Temp: (!) 97.5 F (36.4 C) (!) 97.5 F (36.4 C)  (!) 97.3 F (36.3 C)  TempSrc: Oral Oral  Oral  SpO2: 94% 90%  97%  Weight: 163 lb 0.6 oz (74 kg)     Height:        Intake/Output Summary (Last 24 hours) at 06/07/2018 1239 Last data filed at 06/07/2018 1000 Gross per 24 hour  Intake 600 ml  Output 450 ml  Net 150 ml   Filed Weights   06/06/18 1352 06/06/18 2142 06/07/18 0428  Weight: 164 lb (74.4 kg) 164 lb 10.9 oz (74.7 kg) 163 lb 0.6 oz (74 kg)    Telemetry    Vpacing Personally Reviewed  ECG      - Personally Reviewed  Physical Exam    GEN: No acute distress.   Neck: JVD 10cm Cardiac: RRR, no  murmurs, rubs, or gallops.  Respiratory: Clear to auscultation bilaterally. GI: Soft, nontender,  non-distended  MS:  tr edema; No deformity. Neuro:  Nonfocal  Psych: Normal affect  Skin Warm and dry  Groin ok  Labs    Chemistry Recent Labs  Lab 06/06/18 1515 06/07/18 0614  NA 141 141  K 4.2 4.3  CL 106 107  CO2 21* 29  GLUCOSE 96 106*  BUN 22 24*  CREATININE 1.38* 1.50*  CALCIUM 8.8* 8.8*  GFRNONAA 35* 31*  GFRAA 40* 36*  ANIONGAP 14 5     Hematology Recent Labs  Lab 06/06/18 1515 06/07/18 0614  WBC 5.5 5.8  RBC 4.18 3.87  HGB 12.7 11.9*  HCT 41.3 37.6  MCV 98.8 97.2  MCH 30.4 30.7  MCHC 30.8 31.6  RDW 14.3 14.4  PLT 145* 135*    Cardiac Enzymes Recent Labs  Lab 06/06/18 1515  TROPONINI 0.10*   No results for input(s): TROPIPOC in the last 168 hours.   BNP Recent Labs  Lab 06/06/18 1515  BNP 1,222.1*     DDimer No results for input(s): DDIMER in the last 168 hours.   Radiology    Dg Chest 2 View  Result Date: 06/06/2018 CLINICAL DATA:  Pacemaker.  Shortness of breath. EXAM: CHEST - 2 VIEW COMPARISON:  Chest radiograph 05/17/2018. FINDINGS: Marked enlargement of the cardiac silhouette. No consolidation or edema. No pneumothorax. Single lead pacer LEFT subclavian approach, appears unchanged from the June radiograph. Skeletal osteopenia is noted. IMPRESSION: Satisfactory pacemaker appearance. No pneumothorax, consolidation, or edema. Cardiomegaly. Electronically Signed   By: Elsie Stain M.D.   On: 06/06/2018 16:07       Device Interrogation        Assessment & Plan    AV nodal ablation  Acute Chronic CHF  LV dysfunction acute presumed rate related  Pacemaker   Diaphragmatic stimulation  The patient's diaphragmatic stimulation is somewhat concerning the location of the lead.  We have programmed around it.  There is a history of lead stability in the coronary veins even using active fixation leads  She is significantly volume overloaded at least on the right side with severe JVD.  We will continue to diurese her  aggressively.  We will reprogram her device from 90--80 and activate rate response.  Signed, Sherryl Manges, MD  06/07/2018, 12:39 PM

## 2018-06-07 NOTE — Telephone Encounter (Signed)
Called daughter and gave her results of INR done at [facility] today of 1.75.

## 2018-06-07 NOTE — Progress Notes (Signed)
Progress Note  Patient Name: Karen Richardson Date of Encounter: 06/07/2018  Primary Cardiologist: Prentice Docker, MD   Subjective   She is very tired and sleeping a lot today,  Inpatient Medications    Scheduled Meds: . atorvastatin  10 mg Oral QPM  . carbamazepine  100 mg Oral BID  . diltiazem  180 mg Oral Daily  . furosemide  40 mg Oral Daily  . metoprolol tartrate  25 mg Oral BID  . mirtazapine  15 mg Oral QHS  . pantoprazole  40 mg Oral Daily  . potassium chloride  20 mEq Oral Daily  . sodium chloride flush  3 mL Intravenous Q12H  . Warfarin - Pharmacist Dosing Inpatient   Does not apply q1800   Continuous Infusions: . sodium chloride     PRN Meds: sodium chloride, acetaminophen, ondansetron (ZOFRAN) IV, sodium chloride flush   Vital Signs    Vitals:   06/07/18 0012 06/07/18 0428 06/07/18 0800 06/07/18 0937  BP: 100/65 131/79 123/80 123/80  Pulse: 90 90 90 80  Resp: 18 18 16    Temp: 97.6 F (36.4 C) (!) 97.5 F (36.4 C) (!) 97.5 F (36.4 C)   TempSrc: Oral Oral Oral   SpO2: 95% 94% 90%   Weight:  163 lb 0.6 oz (74 kg)    Height:        Intake/Output Summary (Last 24 hours) at 06/07/2018 1041 Last data filed at 06/07/2018 1000 Gross per 24 hour  Intake 600 ml  Output 450 ml  Net 150 ml   Filed Weights   06/06/18 1352 06/06/18 2142 06/07/18 0428  Weight: 164 lb (74.4 kg) 164 lb 10.9 oz (74.7 kg) 163 lb 0.6 oz (74 kg)    Telemetry    V paced rhythm, ventricular rate 40-90 - Personally Reviewed  Physical Exam  fatigued GEN: No acute distress.   Neck: No JVD Cardiac: RRR, no murmurs, rubs, or gallops.  Respiratory: minimal crackles to auscultation bilaterally. GI: Soft, nontender, non-distended  MS: mild edema around the ankles; No deformity. Neuro:  Nonfocal  Psych: Normal affect   Labs    Chemistry Recent Labs  Lab 06/06/18 1515 06/07/18 0614  NA 141 141  K 4.2 4.3  CL 106 107  CO2 21* 29  GLUCOSE 96 106*  BUN 22 24*  CREATININE  1.38* 1.50*  CALCIUM 8.8* 8.8*  GFRNONAA 35* 31*  GFRAA 40* 36*  ANIONGAP 14 5     Hematology Recent Labs  Lab 06/06/18 1515 06/07/18 0614  WBC 5.5 5.8  RBC 4.18 3.87  HGB 12.7 11.9*  HCT 41.3 37.6  MCV 98.8 97.2  MCH 30.4 30.7  MCHC 30.8 31.6  RDW 14.3 14.4  PLT 145* 135*    Cardiac Enzymes Recent Labs  Lab 06/06/18 1515  TROPONINI 0.10*   No results for input(s): TROPIPOC in the last 168 hours.   BNP Recent Labs  Lab 06/06/18 1515  BNP 1,222.1*     DDimer No results for input(s): DDIMER in the last 168 hours.   Radiology    Dg Chest 2 View  Result Date: 06/06/2018 CLINICAL DATA:  Pacemaker.  Shortness of breath. EXAM: CHEST - 2 VIEW COMPARISON:  Chest radiograph 05/17/2018. FINDINGS: Marked enlargement of the cardiac silhouette. No consolidation or edema. No pneumothorax. Single lead pacer LEFT subclavian approach, appears unchanged from the June radiograph. Skeletal osteopenia is noted. IMPRESSION: Satisfactory pacemaker appearance. No pneumothorax, consolidation, or edema. Cardiomegaly. Electronically Signed   By: Jackquline Denmark  Curnes M.D.   On: 06/06/2018 16:07    Cardiac Studies     Patient Profile     84 y.o. female   Assessment & Plan    1.  Acute on chronic diastolic heart failure She is only mildly fluid overloaded, I will give two more doses of lasix 40 mg BID Minimal diuresis overnight  BMET in AM, Crea slightly up  2.  Diaphragmatic stim RV threshold 0.5V@0 . . Device reprogrammed to 2V@1msec  today to eliminate diaphragmatic stim  3.  Persistent AF S/p AVN ablation Pt device dependent today Will discuss with Dr Graciela Husbands tomorrow lowering V rate to 80   4. Deconditioning PT/OT eval, she is tired and deconditioned  For questions or updates, please contact [facility] Please consult [email or web address] for contact info under Cardiology/STEMI.   Signed, Tobias Alexander, MD  06/07/2018, 10:41 AM

## 2018-06-07 NOTE — Evaluation (Signed)
Physical Therapy Evaluation Patient Details Name: Karen Richardson MRN: [id number] DOB: [birth year] Today's Date: 06/07/2018   History of Present Illness  Karen Richardson is a 84 y.o. female with a history of chronic diastolic heart failure, persistent atrial fibrillation, prior CVA, GERD, CKD, and prior PE who is being seen today for the evaluation of abdominal distention and shortness of breath at the request of Dr Criss Alvine.  Clinical Impression  Pt admitted with above. Pt functioning at supervision level. Pt's quick onset of fatigue is her debilitating factor when is comes to mobility and function. Acute PT to cont. To follow.    Follow Up Recommendations No PT follow up;Supervision - Intermittent    Equipment Recommendations  None recommended by PT    Recommendations for Other Services       Precautions / Restrictions Precautions Precautions: Fall Precaution Comments: old pacemaker Restrictions Weight Bearing Restrictions: No      Mobility  Bed Mobility Overal bed mobility: Independent             General bed mobility comments: hob flat, no difficulty  Transfers Overall transfer level: Needs assistance Equipment used: Rolling walker (2 wheeled) Transfers: Sit to/from Stand Sit to Stand: Supervision         General transfer comment: verbal cues to push up from bed  Ambulation/Gait Ambulation/Gait assistance: Min guard Gait Distance (Feet): 180 Feet Assistive device: Rolling walker (2 wheeled) Gait Pattern/deviations: Step-through pattern Gait velocity: guarded/cautious Gait velocity interpretation: >2.62 ft/sec, indicative of community ambulatory General Gait Details: 1 standing rest break due to fatigue and SOB, Spo2 >95% on RA, HR in 80s t/o session , no episode of LOB just c/o fatigue  Stairs            Wheelchair Mobility    Modified Rankin (Stroke Patients Only)       Balance Overall balance assessment: Mild deficits observed, not formally  tested                                           Pertinent Vitals/Pain Pain Assessment: No/denies pain    Home Living Family/patient expects to be discharged to:: Private residence Living Arrangements: Spouse/significant other Available Help at Discharge: Family;Available 24 hours/day Type of Home: House Home Access: Ramped entrance     Home Layout: Able to live on main level with bedroom/bathroom;Two level Home Equipment: Walker - 4 wheels Additional Comments: has 5 indoor dogs and several cats    Prior Function Level of Independence: Independent         Comments: drives a 4 wheeler     Hand Dominance   Dominant Hand: Right    Extremity/Trunk Assessment   Upper Extremity Assessment Upper Extremity Assessment: Overall WFL for tasks assessed    Lower Extremity Assessment Lower Extremity Assessment: Overall WFL for tasks assessed    Cervical / Trunk Assessment Cervical / Trunk Assessment: Normal  Communication   Communication: No difficulties  Cognition Arousal/Alertness: Awake/alert Behavior During Therapy: WFL for tasks assessed/performed Overall Cognitive Status: Within Functional Limits for tasks assessed                                        General Comments General comments (skin integrity, edema, etc.): vss    Exercises     Assessment/Plan  PT Assessment Patient needs continued PT services  PT Problem List Decreased strength;Decreased activity tolerance;Decreased balance;Decreased mobility       PT Treatment Interventions DME instruction;Gait training;Stair training;Functional mobility training;Therapeutic activities;Therapeutic exercise;Balance training;Neuromuscular re-education    PT Goals (Current goals can be found in the Care Plan section)  Acute Rehab PT Goals Patient Stated Goal: home to animals PT Goal Formulation: With patient Time For Goal Achievement: 06/21/18 Potential to Achieve Goals:  Good    Frequency Min 2X/week   Barriers to discharge        Co-evaluation               AM-PAC PT "6 Clicks" Daily Activity  Outcome Measure Difficulty turning over in bed (including adjusting bedclothes, sheets and blankets)?: None Difficulty moving from lying on back to sitting on the side of the bed? : None Difficulty sitting down on and standing up from a chair with arms (e.g., wheelchair, bedside commode, etc,.)?: None Help needed moving to and from a bed to chair (including a wheelchair)?: A Little Help needed walking in hospital room?: A Little Help needed climbing 3-5 steps with a railing? : A Little 6 Click Score: 21    End of Session Equipment Utilized During Treatment: Gait belt Activity Tolerance: Patient tolerated treatment well Patient left: in chair;with call bell/phone within reach Nurse Communication: Mobility status PT Visit Diagnosis: Unsteadiness on feet (R26.81)    Time: 8295-6213 PT Time Calculation (min) (ACUTE ONLY): 21 min   Charges:   PT Evaluation $PT Eval Low Complexity: 1 Low     PT G CodesLewis Shock, PT, DPT Pager #: [phone] Office #: [phone]   Walden Statz M Reis Pienta 06/07/2018, 3:17 PM

## 2018-06-07 NOTE — Progress Notes (Signed)
Spoke with daughter  And outlined the issue of her heart failure We will ask OT/PT  to evaluate about rehab

## 2018-06-08 ENCOUNTER — Inpatient Hospital Stay (HOSPITAL_COMMUNITY): Payer: Medicare HMO

## 2018-06-08 ENCOUNTER — Encounter (HOSPITAL_COMMUNITY): Payer: Self-pay | Admitting: General Practice

## 2018-06-08 DIAGNOSIS — I313 Pericardial effusion (noninflammatory): Secondary | ICD-10-CM

## 2018-06-08 DIAGNOSIS — I5043 Acute on chronic combined systolic (congestive) and diastolic (congestive) heart failure: Secondary | ICD-10-CM

## 2018-06-08 LAB — BASIC METABOLIC PANEL
ANION GAP: 9 (ref 5–15)
BUN: 24 mg/dL — ABNORMAL HIGH (ref 8–23)
CALCIUM: 8.7 mg/dL — AB (ref 8.9–10.3)
CO2: 25 mmol/L (ref 22–32)
Chloride: 108 mmol/L (ref 98–111)
Creatinine, Ser: 1.63 mg/dL — ABNORMAL HIGH (ref 0.44–1.00)
GFR, EST AFRICAN AMERICAN: 33 mL/min — AB (ref >60)
GFR, EST NON AFRICAN AMERICAN: 28 mL/min — AB (ref >60)
Glucose, Bld: 134 mg/dL — ABNORMAL HIGH (ref 70–99)
POTASSIUM: 4 mmol/L (ref 3.5–5.1)
Sodium: 142 mmol/L (ref 135–145)

## 2018-06-08 LAB — PROTIME-INR
INR: 2
PROTHROMBIN TIME: 22.5 s — AB (ref 11.4–15.2)

## 2018-06-08 LAB — ECHOCARDIOGRAM LIMITED
Height: 65 in
Weight: 2649.6 oz

## 2018-06-08 MED ORDER — WARFARIN SODIUM 7.5 MG PO TABS
3.7500 mg | ORAL_TABLET | Freq: Once | ORAL | Status: AC
  Administered 2018-06-08: 3.75 mg via ORAL
  Filled 2018-06-08: qty 0.5

## 2018-06-08 MED ORDER — CARVEDILOL 6.25 MG PO TABS
6.2500 mg | ORAL_TABLET | Freq: Two times a day (BID) | ORAL | Status: DC
  Administered 2018-06-08 – 2018-06-09 (×2): 6.25 mg via ORAL
  Filled 2018-06-08 (×2): qty 1

## 2018-06-08 NOTE — Progress Notes (Signed)
ANTICOAGULATION CONSULT NOTE - Initial Consult  Pharmacy Consult for warfarin Indication: atrial fibrillation and pulmonary embolus (hx)  Allergies  Allergen Reactions  . Codeine Nausea Only  . Amiodarone Other (See Comments)    Neuropathy and skin discoloration     Patient Measurements: Height: 5\' 5"  (165.1 cm) Weight: 165 lb 9.6 oz (75.1 kg) IBW/kg (Calculated) : 57 Heparin Dosing Weight: 72.2 kg  Vital Signs: Temp: 97.7 F (36.5 C) (07/10 0512) Temp Source: Oral (07/10 0512) BP: 143/110 (07/10 0512) Pulse Rate: 78 (07/10 0512)  Labs: Recent Labs    06/06/18 1515 06/06/18 2149 06/07/18 0614 06/08/18 0538  HGB 12.7  --  11.9*  --   HCT 41.3  --  37.6  --   PLT 145*  --  135*  --   LABPROT  --  19.7* 20.3* 22.5*  INR  --  1.68 1.75 2.00  CREATININE 1.38*  --  1.50*  --   TROPONINI 0.10*  --   --   --     Estimated Creatinine Clearance: 29.3 mL/min (A) (by C-G formula based on SCr of 1.5 mg/dL (H)).   Medical History: Past Medical History:  Diagnosis Date  . Acute on chronic diastolic (congestive) heart failure (HCC)   . Anxiety   . Atrial fibrillation with RVR (HCC) 12/01/2016  . CKD (chronic kidney disease), stage III (HCC) 12/12/2016  . Depression   . Dysphagia   . GERD (gastroesophageal reflux disease) 12/01/2016  . Heart murmur   . History of esophageal stricture 12/10/2016  . Hyperlipidemia   . Hypertension    x 4 months  . Presbyesophagus 12/11/2016  . Pulmonary emboli (HCC) 12/01/2016  . Pulmonary embolism (HCC)   . Pulmonary emphysema (HCC) 09/06/2017  . Pulmonary nodule   . Seizures (HCC)   . Stroke [facility])     Medications:  Scheduled:  . atorvastatin  10 mg Oral QPM  . carbamazepine  100 mg Oral BID  . diltiazem  180 mg Oral Daily  . metoprolol tartrate  25 mg Oral BID  . mirtazapine  15 mg Oral QHS  . pantoprazole  40 mg Oral Daily  . potassium chloride  20 mEq Oral Daily  . sodium chloride flush  3 mL Intravenous Q12H  . Warfarin -  Pharmacist Dosing Inpatient   Does not apply q1800    Assessment: 84 yo female on chronic Coumadin for hx afib and PE admitted with issue with pacemaker.  Pharmacy asked to continue coumadin.  Per patient report she last took a dose of Coumadin day prior to admission and was given an additional 1mg  for total of 4.75mg  dose (7/8).  INR up to 2.00. Noted DDI with carbamazepine (PTA med). CBC trend down slightly.  PTA warfarin dose: 3.75 mg on MWFSa, 2.5 mg TTSu   Goal of Therapy:  INR 2-3 Monitor platelets by anticoagulation protocol: Yes   Plan:  1. Warfarin 3.75mg  tonight x1 2. Daily INR. 3. Monitor s/sx bleeding, CBC  Bradley Ferris, PharmD PGY1 Pharmacy Resident Direct Phone: [phone] 06/08/2018  9:44 AM

## 2018-06-08 NOTE — Plan of Care (Signed)
  Problem: Activity: Goal: Capacity to carry out activities will improve Outcome: Progressing   Problem: Education: Goal: Ability to verbalize understanding of medication therapies will improve Outcome: Progressing   Problem: Education: Goal: Ability to demonstrate management of disease process will improve Outcome: Progressing   

## 2018-06-08 NOTE — Progress Notes (Signed)
Physical Therapy Treatment Patient Details Name: Karen Richardson MRN: [id number] DOB: 1934/03/14 Today's Date: 06/08/2018    History of Present Illness Karen Richardson is a 84 y.o. female with a history of chronic diastolic heart failure, persistent atrial fibrillation, prior CVA, GERD, CKD, and prior PE who is being seen today for the evaluation of abdominal distention and shortness of breath at the request of Dr Criss Alvine.    PT Comments    Pt making steady progress with functional mobility. Pt ambulated in hallway without AD (declining use of RW) but with 1HHA to simulate use of a cane. Pt with mild instability but no overt LOB or need for physical assistance, min guard for safety. PT will continue to follow acutely to progress mobility as tolerated.    Follow Up Recommendations  No PT follow up;Supervision - Intermittent     Equipment Recommendations  None recommended by PT    Recommendations for Other Services       Precautions / Restrictions Precautions Precautions: Fall Precaution Comments: old pacemaker Restrictions Weight Bearing Restrictions: No    Mobility  Bed Mobility Overal bed mobility: Independent             General bed mobility comments: pt OOB in chair upon arrival  Transfers Overall transfer level: Needs assistance Equipment used: None Transfers: Sit to/from Stand Sit to Stand: Supervision         General transfer comment: supervision for safety  Ambulation/Gait Ambulation/Gait assistance: Min guard Gait Distance (Feet): 200 Feet Assistive device: 1 person hand held assist Gait Pattern/deviations: Step-through pattern;Drifts right/left Gait velocity: decreased Gait velocity interpretation: >2.62 ft/sec, indicative of community ambulatory General Gait Details: pt with mild instabilitly without use of an AD with 1HHA; pt declining use of RW this session. No need for rest breaks and no increased dyspnea or difficulty breathing   Stairs              Wheelchair Mobility    Modified Rankin (Stroke Patients Only)       Balance Overall balance assessment: Needs assistance Sitting-balance support: Feet supported Sitting balance-Leahy Scale: Good     Standing balance support: During functional activity;No upper extremity supported Standing balance-Leahy Scale: Fair                              Cognition Arousal/Alertness: Awake/alert Behavior During Therapy: WFL for tasks assessed/performed Overall Cognitive Status: Within Functional Limits for tasks assessed                                        Exercises      General Comments        Pertinent Vitals/Pain Pain Assessment: No/denies pain    Home Living Family/patient expects to be discharged to:: Private residence Living Arrangements: Spouse/significant other Available Help at Discharge: Family;Available 24 hours/day Type of Home: House Home Access: Ramped entrance   Home Layout: Able to live on main level with bedroom/bathroom;Two level Home Equipment: Walker - 4 wheels Additional Comments: has 5 indoor dogs and several cats    Prior Function Level of Independence: Independent      Comments: drives a 4 wheeler   PT Goals (current goals can now be found in the care plan section) Acute Rehab PT Goals Patient Stated Goal: go home to animals PT Goal Formulation: With patient Time For  Goal Achievement: 06/21/18 Potential to Achieve Goals: Good Progress towards PT goals: Progressing toward goals    Frequency    Min 2X/week      PT Plan Current plan remains appropriate    Co-evaluation              AM-PAC PT "6 Clicks" Daily Activity  Outcome Measure  Difficulty turning over in bed (including adjusting bedclothes, sheets and blankets)?: None Difficulty moving from lying on back to sitting on the side of the bed? : None Difficulty sitting down on and standing up from a chair with arms (e.g., wheelchair,  bedside commode, etc,.)?: None Help needed moving to and from a bed to chair (including a wheelchair)?: None Help needed walking in hospital room?: A Little Help needed climbing 3-5 steps with a railing? : A Little 6 Click Score: 22    End of Session   Activity Tolerance: Patient tolerated treatment well Patient left: in chair;with call bell/phone within reach Nurse Communication: Mobility status PT Visit Diagnosis: Unsteadiness on feet (R26.81)     Time: 3612-2449 PT Time Calculation (min) (ACUTE ONLY): 10 min  Charges:  $Gait Training: 8-22 mins                    G Codes:       [place], [employer], [place] [phone]    Alessandra Bevels Ainslee Sou 06/08/2018, 4:40 PM

## 2018-06-08 NOTE — Progress Notes (Addendum)
Progress Note  Patient Name: Karen Richardson Date of Encounter: 06/08/2018  Primary Cardiologist: Prentice Docker, MD   Subjective   Feeling about the same as yesterday. Complaints of urinating all night with little sleep. +JVD. Breathing better. Ambulating in room   Inpatient Medications    Scheduled Meds: . atorvastatin  10 mg Oral QPM  . carbamazepine  100 mg Oral BID  . diltiazem  180 mg Oral Daily  . metoprolol tartrate  25 mg Oral BID  . mirtazapine  15 mg Oral QHS  . pantoprazole  40 mg Oral Daily  . potassium chloride  20 mEq Oral Daily  . sodium chloride flush  3 mL Intravenous Q12H  . Warfarin - Pharmacist Dosing Inpatient   Does not apply q1800   Continuous Infusions: . sodium chloride     PRN Meds: sodium chloride, acetaminophen, ondansetron (ZOFRAN) IV, sodium chloride flush   Vital Signs    Vitals:   06/07/18 1943 06/07/18 2331 06/08/18 0209 06/08/18 0512  BP: 108/72 116/79  (!) 143/110  Pulse: 80 78  78  Resp: 18 16  18   Temp: 98.3 F (36.8 C) 97.6 F (36.4 C)  97.7 F (36.5 C)  TempSrc: Oral Oral  Oral  SpO2: 93% 94%  94%  Weight:   165 lb 9.6 oz (75.1 kg)   Height:        Intake/Output Summary (Last 24 hours) at 06/08/2018 0923 Last data filed at 06/08/2018 0912 Gross per 24 hour  Intake 1323 ml  Output 1400 ml  Net -77 ml   Filed Weights   06/06/18 2142 06/07/18 0428 06/08/18 0209  Weight: 164 lb 10.9 oz (74.7 kg) 163 lb 0.6 oz (74 kg) 165 lb 9.6 oz (75.1 kg)   Physical Exam   General: Obese, NAD Skin: Warm, dry, intact  Head: Normocephalic, atraumatic, clear, moist mucus membranes. Neck: Negative for carotid bruits. + JVD Lungs:Clear to ausculation bilaterally. No wheezes, rales, or rhonchi. Breathing is unlabored. Cardiovascular: RRR with S1 S2. No murmurs, rubs or gallops Abdomen: Soft, non-tender, non-distended with normoactive bowel sounds. No obvious abdominal masses. MSK: Strength and tone appear normal for age. 5/5 in  all extremities Extremities: No edema. No clubbing or cyanosis. DP/PT pulses 2+ bilaterally Neuro: Alert and oriented. No focal deficits. No facial asymmetry. MAE spontaneously. Psych: Responds to questions appropriately with normal affect.    Labs    Chemistry Recent Labs  Lab 06/06/18 1515 06/07/18 0614  NA 141 141  K 4.2 4.3  CL 106 107  CO2 21* 29  GLUCOSE 96 106*  BUN 22 24*  CREATININE 1.38* 1.50*  CALCIUM 8.8* 8.8*  GFRNONAA 35* 31*  GFRAA 40* 36*  ANIONGAP 14 5     Hematology Recent Labs  Lab 06/06/18 1515 06/07/18 0614  WBC 5.5 5.8  RBC 4.18 3.87  HGB 12.7 11.9*  HCT 41.3 37.6  MCV 98.8 97.2  MCH 30.4 30.7  MCHC 30.8 31.6  RDW 14.3 14.4  PLT 145* 135*    Cardiac Enzymes Recent Labs  Lab 06/06/18 1515  TROPONINI 0.10*   No results for input(s): TROPIPOC in the last 168 hours.   BNP Recent Labs  Lab 06/06/18 1515  BNP 1,222.1*     DDimer No results for input(s): DDIMER in the last 168 hours.   Radiology    Dg Chest 2 View  Result Date: 06/06/2018 CLINICAL DATA:  Pacemaker.  Shortness of breath. EXAM: CHEST - 2 VIEW COMPARISON:  Chest radiograph 05/17/2018. FINDINGS: Marked enlargement of the cardiac silhouette. No consolidation or edema. No pneumothorax. Single lead pacer LEFT subclavian approach, appears unchanged from the June radiograph. Skeletal osteopenia is noted. IMPRESSION: Satisfactory pacemaker appearance. No pneumothorax, consolidation, or edema. Cardiomegaly. Electronically Signed   By: Elsie Stain M.D.   On: 06/06/2018 16:07   Telemetry    06/08/08 V paced, HR 80 - Personally Reviewed  ECG    No new tracings as of 06/08/18 - Personally Reviewed  Cardiac Studies   None   Patient Profile     84 y.o. female admitted 06/06/18 with acute chronic CHF following av nodal ablation last week for uncotrolled Afib and 2/2 rate related myopathy.  Pt with a hx of chronic diastolic heart failure, persistent atrial fibrillation,  prior CVA, GERD, CKD, and prior PE   Assessment & Plan    1. Acute on chronic systolic heart failure: -Per echocardiogram 05/18/18, LVEF 30-35% with NWM -Appeared to be fluid volume overloaded on exam 06/07/18>>>was given two additional doses of Lasix 40mg  BID. Will continue Lasix 40mg  BID today and follow BMET this AM>>tomorrow.  -Weight, 165lb today, 164lb on admission  -I&O, net negative 24ml, 24H total output 1.4L -Creatinine, 1.50 yesterday.  Will obtain BMET  2. Diaphragmatic stim: -RV threshold 0.5V@0 . . Device reprogrammed to 2V@1msec  today to eliminate diaphragmatic stim -Reprogrammed per EP>>>reprogram her device from 90--80 and activate rate response  3. Persistent atrial fibrillation: -s/p recent AVN ablation >>>EP following  -Continue diltiazem 180 mg, Lopressor 25, Coumadin per pharmacy -[id number] =   4. Deconditioning:  -PT/OT evaluation    Signed, Georgie Chard NP-C [facility] Pager: [phone] 06/08/2018, 9:23 AM     For questions or updates, please contact   Please consult [email or web address] for contact info under Cardiology/STEMI.  The patient was seen, examined and discussed with Georgie Chard, NP  and I agree with the above.   She feels slightly better with improved SOB, complains of urinating frequently at night and fatigue. Physical exam shows elevated JVDs up to the jaws B/L, clear lungs, no significant LE edema. I will continue iv Lasix 40 mg Q12H for another two doses, no BMP today, we ordered stat. Anticipated discharge tomorrow.   Tobias Alexander, MD 06/08/2018

## 2018-06-08 NOTE — Progress Notes (Signed)
  Echocardiogram 2D Echocardiogram has been performed.  Karen Richardson 06/08/2018, 3:56 PM

## 2018-06-08 NOTE — Telephone Encounter (Signed)
New message  Darl Pikes is calling stating that pt would like a ref for a second opinon to see a cardiologist and asking if Dr. Graciela Husbands can refer her to someone else.

## 2018-06-08 NOTE — Progress Notes (Addendum)
Progress Note  Patient Name: Karen Richardson Date of Encounter: 06/08/2018  Primary Cardiologist: Prentice Docker, MD   Subjective   Feeling somewhat better  [facility] further today than would have been able a few weeks ago  C/o dry mouth, but no new meds   Inpatient Medications    Scheduled Meds: . atorvastatin  10 mg Oral QPM  . carbamazepine  100 mg Oral BID  . diltiazem  180 mg Oral Daily  . metoprolol tartrate  25 mg Oral BID  . mirtazapine  15 mg Oral QHS  . pantoprazole  40 mg Oral Daily  . potassium chloride  20 mEq Oral Daily  . sodium chloride flush  3 mL Intravenous Q12H  . warfarin  3.75 mg Oral ONCE-1800  . Warfarin - Pharmacist Dosing Inpatient   Does not apply q1800   Continuous Infusions: . sodium chloride     PRN Meds: sodium chloride, acetaminophen, ondansetron (ZOFRAN) IV, sodium chloride flush   Vital Signs    Vitals:   06/07/18 2331 06/08/18 0209 06/08/18 0512 06/08/18 1213  BP: 116/79  (!) 143/110 103/85  Pulse: 78  78 82  Resp: 16  18 20   Temp: 97.6 F (36.4 C)  97.7 F (36.5 C) 97.7 F (36.5 C)  TempSrc: Oral  Oral Oral  SpO2: 94%  94% 95%  Weight:  165 lb 9.6 oz (75.1 kg)    Height:        Intake/Output Summary (Last 24 hours) at 06/08/2018 1218 Last data filed at 06/08/2018 0912 Gross per 24 hour  Intake 1203 ml  Output 1400 ml  Net -197 ml   Filed Weights   06/06/18 2142 06/07/18 0428 06/08/18 0209  Weight: 164 lb 10.9 oz (74.7 kg) 163 lb 0.6 oz (74 kg) 165 lb 9.6 oz (75.1 kg)   Physical Exam   General: Obese, NAD Skin: Warm, dry, intact  Head: Normocephalic, atraumatic, clear, moist mucus membranes. Neck: Negative for carotid bruits. + JVD Lungs:Clear to ausculation bilaterally. No wheezes, rales, or rhonchi. Breathing is unlabored. Cardiovascular: RRR with S1 S2. No murmurs, rubs or gallops Abdomen: Soft, non-tender, non-distended with normoactive bowel sounds. No obvious abdominal masses. MSK: Strength and tone appear  normal for age. 5/5 in all extremities Extremities: No edema. No clubbing or cyanosis. DP/PT pulses 2+ bilaterally Neuro: Alert and oriented. No focal deficits. No facial asymmetry. MAE spontaneously. Psych: Responds to questions appropriately with normal affect.    Labs    Chemistry Recent Labs  Lab 06/06/18 1515 06/07/18 0614 06/08/18 0948  NA 141 141 142  K 4.2 4.3 4.0  CL 106 107 108  CO2 21* 29 25  GLUCOSE 96 106* 134*  BUN 22 24* 24*  CREATININE 1.38* 1.50* 1.63*  CALCIUM 8.8* 8.8* 8.7*  GFRNONAA 35* 31* 28*  GFRAA 40* 36* 33*  ANIONGAP 14 5 9      Hematology Recent Labs  Lab 06/06/18 1515 06/07/18 0614  WBC 5.5 5.8  RBC 4.18 3.87  HGB 12.7 11.9*  HCT 41.3 37.6  MCV 98.8 97.2  MCH 30.4 30.7  MCHC 30.8 31.6  RDW 14.3 14.4  PLT 145* 135*    Cardiac Enzymes Recent Labs  Lab 06/06/18 1515  TROPONINI 0.10*   No results for input(s): TROPIPOC in the last 168 hours.   BNP Recent Labs  Lab 06/06/18 1515  BNP 1,222.1*     DDimer No results for input(s): DDIMER in the last 168 hours.   Radiology  Dg Chest 2 View  Result Date: 06/06/2018 CLINICAL DATA:  Pacemaker.  Shortness of breath. EXAM: CHEST - 2 VIEW COMPARISON:  Chest radiograph 05/17/2018. FINDINGS: Marked enlargement of the cardiac silhouette. No consolidation or edema. No pneumothorax. Single lead pacer LEFT subclavian approach, appears unchanged from the June radiograph. Skeletal osteopenia is noted. IMPRESSION: Satisfactory pacemaker appearance. No pneumothorax, consolidation, or edema. Cardiomegaly. Electronically Signed   By: Elsie Stain M.D.   On: 06/06/2018 16:07   Telemetry    06/08/08 V paced, HR 80 - Personally Reviewed  ECG    No new tracings as of 06/08/18 - Personally Reviewed  Cardiac Studies   None   Patient Profile     84 y.o. female admitted 06/06/18 with acute chronic CHF following av nodal ablation last week for uncotrolled Afib and 2/2 rate related myopathy.  Pt  with a hx of chronic diastolic heart failure, persistent atrial fibrillation, prior CVA, GERD, CKD, and prior PE   Assessment & Plan    1. Acute on chronic systolic heart failure:    2. Diaphragmatic stim: -RV threshold 0.5V@0 . . Device reprogrammed    3. Persistent atrial fibrillation: -s/p recent AVN ablation >>>EP following    4. Deconditioning:  -PT/OT evaluation 5. Acute/chronic renal injury      will continue diuresis x 1 more day,  She is urinating and suspect I/O are wrong and weight also as she is better  Will stop dilt as no more need for rate control and will need to follow BP  --lowish BP may be partially responsible for increasing Cr  As Cr normalizes would introduce ARB to help with LV recovery and inther interim change from lopressor>> carvedilol    reviewed above with husband and had spoken with pts daughter yesterday   More than 50% of 45 min was spent in counseling related to the above  W/ JVP and renal issues and funny RV lead, will do echo today to make sure no effusion   Sherryl Manges, MD 06/08/2018

## 2018-06-08 NOTE — Evaluation (Signed)
Occupational Therapy Evaluation Patient Details Name: Karen Richardson MRN: [id number] DOB: 1933-12-13 Today's Date: 06/08/2018    History of Present Illness Karen Richardson is a 84 y.o. female with a history of chronic diastolic heart failure, persistent atrial fibrillation, prior CVA, GERD, CKD, and prior PE who is being seen today for the evaluation of abdominal distention and shortness of breath at the request of Dr Criss Alvine.   Clinical Impression   Pt is Mod I - sup with ADLs and ADL mobility. Pt will have sup and assist prn at home from spouse. All edcuation completed and no further acute OT indicated at this time    Follow Up Recommendations  No OT follow up;Supervision - Intermittent    Equipment Recommendations  None recommended by OT    Recommendations for Other Services       Precautions / Restrictions Precautions Precautions: Fall Precaution Comments: old pacemaker Restrictions Weight Bearing Restrictions: No      Mobility Bed Mobility Overal bed mobility: Independent             General bed mobility comments: hob flat, no difficulty  Transfers Overall transfer level: Needs assistance Equipment used: Rolling walker (2 wheeled) Transfers: Sit to/from Stand Sit to Stand: Supervision              Balance Overall balance assessment: Mild deficits observed, not formally tested                                         ADL either performed or assessed with clinical judgement   ADL Overall ADL's : Modified independent                                       General ADL Comments: Mod I - supervision with ADLs/selfcare     Vision Baseline Vision/History: Wears glasses Wears Glasses: Reading only Patient Visual Report: No change from baseline       Perception     Praxis      Pertinent Vitals/Pain Pain Assessment: No/denies pain     Hand Dominance Right   Extremity/Trunk Assessment Upper Extremity  Assessment Upper Extremity Assessment: Overall WFL for tasks assessed   Lower Extremity Assessment Lower Extremity Assessment: Defer to PT evaluation   Cervical / Trunk Assessment Cervical / Trunk Assessment: Normal   Communication Communication Communication: No difficulties   Cognition Arousal/Alertness: Awake/alert Behavior During Therapy: WFL for tasks assessed/performed Overall Cognitive Status: Within Functional Limits for tasks assessed                                     General Comments       Exercises     Shoulder Instructions      Home Living Family/patient expects to be discharged to:: Private residence Living Arrangements: Spouse/significant other Available Help at Discharge: Family;Available 24 hours/day Type of Home: House Home Access: Ramped entrance     Home Layout: Able to live on main level with bedroom/bathroom;Two level     Bathroom Shower/Tub: Tub/shower unit;Walk-in shower   Bathroom Toilet: Handicapped height     Home Equipment: Walker - 4 wheels   Additional Comments: has 5 indoor dogs and several cats      Prior Functioning/Environment  Level of Independence: Independent        Comments: drives a 4 wheeler        OT Problem List: Decreased activity tolerance;Impaired balance (sitting and/or standing)      OT Treatment/Interventions:      OT Goals(Current goals can be found in the care plan section) Acute Rehab OT Goals Patient Stated Goal: go home to animals OT Goal Formulation: All assessment and education complete, DC therapy  OT Frequency:     Barriers to D/C:    no barriers       Co-evaluation              AM-PAC PT "6 Clicks" Daily Activity     Outcome Measure Help from another person eating meals?: None Help from another person taking care of personal grooming?: None Help from another person toileting, which includes using toliet, bedpan, or urinal?: None Help from another person bathing  (including washing, rinsing, drying)?: A Little Help from another person to put on and taking off regular upper body clothing?: None Help from another person to put on and taking off regular lower body clothing?: A Little 6 Click Score: 22   End of Session    Activity Tolerance: Patient tolerated treatment well Patient left: in bed;with call bell/phone within reach;with bed alarm set;with family/visitor present  OT Visit Diagnosis: Muscle weakness (generalized) (M62.81)                Time: 1610-9604 OT Time Calculation (min): 18 min Charges:  OT General Charges $OT Visit: 1 Visit OT Evaluation $OT Eval Low Complexity: 1 Low G-Codes: OT G-codes **NOT FOR INPATIENT CLASS** Functional Assessment Tool Used: AM-PAC 6 Clicks Daily Activity     Karen Richardson 06/08/2018, 1:06 PM

## 2018-06-08 NOTE — Plan of Care (Signed)
  Problem: Activity: Goal: Risk for activity intolerance will decrease Outcome: Progressing   Problem: Safety: Goal: Ability to remain free from injury will improve Outcome: Progressing   Problem: Activity: Goal: Capacity to carry out activities will improve Outcome: Progressing   Problem: Pain Managment: Goal: General experience of comfort will improve Outcome: Completed/Met

## 2018-06-09 ENCOUNTER — Other Ambulatory Visit: Payer: Self-pay | Admitting: Cardiology

## 2018-06-09 DIAGNOSIS — E785 Hyperlipidemia, unspecified: Secondary | ICD-10-CM

## 2018-06-09 DIAGNOSIS — Z950 Presence of cardiac pacemaker: Secondary | ICD-10-CM

## 2018-06-09 DIAGNOSIS — N184 Chronic kidney disease, stage 4 (severe): Secondary | ICD-10-CM

## 2018-06-09 HISTORY — DX: Presence of cardiac pacemaker: Z95.0

## 2018-06-09 HISTORY — DX: Hyperlipidemia, unspecified: E78.5

## 2018-06-09 LAB — BASIC METABOLIC PANEL
Anion gap: 12 (ref 5–15)
BUN: 32 mg/dL — ABNORMAL HIGH (ref 8–23)
CALCIUM: 8.6 mg/dL — AB (ref 8.9–10.3)
CO2: 25 mmol/L (ref 22–32)
Chloride: 102 mmol/L (ref 98–111)
Creatinine, Ser: 1.94 mg/dL — ABNORMAL HIGH (ref 0.44–1.00)
GFR, EST AFRICAN AMERICAN: 27 mL/min — AB (ref >60)
GFR, EST NON AFRICAN AMERICAN: 23 mL/min — AB (ref >60)
GLUCOSE: 103 mg/dL — AB (ref 70–99)
POTASSIUM: 4.4 mmol/L (ref 3.5–5.1)
SODIUM: 139 mmol/L (ref 135–145)

## 2018-06-09 LAB — PROTIME-INR
INR: 2.21
PROTHROMBIN TIME: 24.4 s — AB (ref 11.4–15.2)

## 2018-06-09 MED ORDER — CARVEDILOL 6.25 MG PO TABS: 6.2500 mg | ORAL_TABLET | Freq: Two times a day (BID) | ORAL | 4 refills | Status: DC

## 2018-06-09 MED ORDER — WARFARIN SODIUM 2.5 MG PO TABS: 2.5000 mg | ORAL_TABLET | Freq: Once | ORAL | Status: DC

## 2018-06-09 NOTE — Discharge Summary (Signed)
Discharge Summary    Patient ID: PAMI WOOL,  MRN: [id number], DOB/AGE: 01/05/1934 84 y.o.  Admit date: 06/06/2018 Discharge date: 06/09/2018  Primary Care Provider: Aliene Beams Primary Cardiologist: Prentice Docker, MD  Discharge Diagnoses    Principal Problem:   Acute on chronic diastolic heart failure [facility]) Active Problems:   Hypertension   Chronic atrial fibrillation (HCC)   GERD (gastroesophageal reflux disease)   History of esophageal stricture   CKD (chronic kidney disease), stage III (HCC)   Monitoring for long-term anticoagulant use   Other secondary pulmonary hypertension (HCC)   Pulmonary emphysema (HCC)   HLD (hyperlipidemia)   S/P placement of cardiac pacemaker  Allergies Allergies  Allergen Reactions  . Codeine Nausea Only  . Amiodarone Other (See Comments)    Neuropathy and skin discoloration    Diagnostic Studies/Procedures    None    History of Present Illness     Karen Richardson is a 84 y.o. female with a history of chronic diastolic heart failure, persistent atrial fibrillation on Coumadin, prior CVA, GERD, CKD, and prior PE who followed by Cardiology for the evaluation of abdominal distention and shortness of breath at the request of Dr Criss Alvine.  Ms. Ress has had a lot of difficulty lately with atrial fibrillation and acute on chronic diastolic heart failure. She underwent AVN ablation on 06/03/18 and initially did well, however subsequently had increased shortness of breath and acute weight gain on day of admission, 06/06/18. She also has had extreme fatigue and exercise intolerance along with orthopnea and PND. Given her symptoms, patient called cardiology office and was told to report to the emergency department for further evaluation.   Hospital Course    In the ED, it was noted that she appeared to have pulsations that could be from overstimulation from her pacemaker. CXR performed which was unremarkable.  Troponin levels mildly elevated  at 0.10. Creatinine was elevated at 1.50 which appears to be around her baseline.  BNP was markedly elevated at one 1222.1.    Hospital problems include:  1. Acute on chronic systolic heart failure: -Per echocardiogram 05/18/18, LVEF 30-35% with NWM -Appeared to be fluid volume overloaded on exam 06/07/18>>>was given two additional doses of Lasix 40mg  BID. Lasix 40mg  BID continued for one additional day with good output  -Weight, 166lb today, 164lb on admission  -I&O, net negative 1.1L, 24H total output 2.1L day of discharge  -Creatinine,elevated today at 1.94>>>up from 1.63 yesterday -As renal function normalizes, will consider ACE-I/ARB for low EF -Lopressor changed to carvedilol 6.25. Monitor BP, low normal while inpatient (107/74>117/75>115/81) -Will restart home dose Lasix on Saturday 06/11/18 and follow with a BMET at her PPM check on 06/16/18.   2. Diaphragmatic stim: -RV threshold 0.5V@0 . . Device reprogrammed to 2V@1msec  today to eliminate diaphragmatic stim -Reprogrammed per EP>>>reprogram her device from 90--80 and activate rate response  3. Persistent atrial fibrillation: -s/p recent AVN ablation >>>EP following  -Continue carvedilol, Coumadin per pharmacy>>>will resume home dosing of Coumadin: 3.75mg  on M, W, F, Sat and 2.5mg  on other days. She will need an INR check within a week to ensure adequate INR.  -Diltiazem stopped yesterday per EP>>>started on carvedilol in place of metoprolol given her reduced LV function  -CHA2DS2VASc =5  4. Deconditioning:  -PT/OT evaluation>>>no follow up OT recommended  -Ambulating more without complication   5. Acute on chronic kidney disease stage III: -Creatinine, 1.94 today, up from 1.63 yesterday  -Baseline appears to be in the 1.3-1.5 range  -  Likely she is at her dry weight and no additional Lasix needed and will resume home dose on 06/11/18  Consultants: None  _____________  Discharge Vitals Blood pressure 107/74,  pulse 80, temperature (!) 97.3 F (36.3 C), temperature source Oral, resp. rate 20, height 5\' 5"  (1.651 m), weight 166 lb 3.2 oz (75.4 kg), SpO2 98 %.  Filed Weights   06/07/18 0428 06/08/18 0209 06/09/18 0435  Weight: 163 lb 0.6 oz (74 kg) 165 lb 9.6 oz (75.1 kg) 166 lb 3.2 oz (75.4 kg)   Labs & Radiologic Studies    CBC Recent Labs    06/06/18 1515 06/07/18 0614  WBC 5.5 5.8  NEUTROABS 3.0  --   HGB 12.7 11.9*  HCT 41.3 37.6  MCV 98.8 97.2  PLT 145* 135*   Basic Metabolic Panel Recent Labs    16/10/96 0948 06/09/18 0604  NA 142 139  K 4.0 4.4  CL 108 102  CO2 25 25  GLUCOSE 134* 103*  BUN 24* 32*  CREATININE 1.63* 1.94*  CALCIUM 8.7* 8.6*   Cardiac Enzymes Recent Labs    06/06/18 1515  TROPONINI 0.10*   Dg Chest 2 View  Result Date: 06/06/2018 CLINICAL DATA:  Pacemaker.  Shortness of breath. EXAM: CHEST - 2 VIEW COMPARISON:  Chest radiograph 05/17/2018. FINDINGS: Marked enlargement of the cardiac silhouette. No consolidation or edema. No pneumothorax. Single lead pacer LEFT subclavian approach, appears unchanged from the June radiograph. Skeletal osteopenia is noted. IMPRESSION: Satisfactory pacemaker appearance. No pneumothorax, consolidation, or edema. Cardiomegaly. Electronically Signed   By: Elsie Stain M.D.   On: 06/06/2018 16:07   Dg Chest 2 View  Result Date: 05/17/2018 CLINICAL DATA:  Abdominal swelling.  Pacemaker. EXAM: CHEST - 2 VIEW COMPARISON:  05/10/2018. FINDINGS: Cardiac pacer stable position. Stable cardiomegaly with normal pulmonary vascularity. Mild basilar atelectasis. No focal alveolar infiltrate. No pleural effusion or pneumothorax. IMPRESSION: 1.  Cardiac pacer stable position.  Stable cardiomegaly. 2.  Low lung volumes with bibasilar atelectasis. Electronically Signed   By: Maisie Fus  Register   On: 05/17/2018 15:10   Ct Angio Chest Pe W And/or Wo Contrast  Result Date: 05/17/2018 CLINICAL DATA:  84 year old female status post pacemaker  placement last week. Radiating chest pain, shortness of breath, fatigue. EXAM: CT ANGIOGRAPHY CHEST WITH CONTRAST TECHNIQUE: Multidetector CT imaging of the chest was performed using the standard protocol during bolus administration of intravenous contrast. Multiplanar CT image reconstructions and MIPs were obtained to evaluate the vascular anatomy. CONTRAST:  ISOVUE-370 IOPAMIDOL (ISOVUE-370) INJECTION 76% COMPARISON:  Chest radiographs 1458 hours today. Chest CTA 10/20/2017. FINDINGS: Cardiovascular: Good contrast bolus timing in the pulmonary arterial tree. Lower lung respiratory motion. No focal filling defect identified in the pulmonary arteries to suggest acute pulmonary embolism. Moderate to severe cardiomegaly. No pericardial effusion. No IV contrast in the aorta. Calcified aortic atherosclerosis. Right heart enlargement plus prominent contrast reflux into dilated hepatic IVC and hepatic veins. Left chest transvenous approach cardiac pacemaker with small volume postoperative gas in the anterior left chest wall (series 5, image 13). Mild if any postoperative seroma near the generator device. Superimposed chronic breast implants. Mediastinum/Nodes: Negative.  No lymphadenopathy. Lungs/Pleura: Small layering pleural effusions, greater on the right. Atelectatic changes to the major airways which are otherwise patent. Mild dependent atelectasis in both lungs. No pulmonary edema or confluent pulmonary opacity. Upper Abdomen: Negative visible liver aside from prominent reflux of IV contrast into the dilated IVC and hepatic veins. Motion artifact limits detail of the  visible gallbladder. Negative visible noncontrast spleen, pancreas, adrenal glands, and bowel in the upper abdomen. Musculoskeletal: Chronic T6 and T12 compression fractures. Stable visualized osseous structures. Review of the MIP images confirms the above findings. IMPRESSION: 1.  Negative for acute pulmonary embolus. 2. Severe cardiomegaly with  reflux of right atrium vascular contrast into the IVC and dilated hepatic veins suggesting a degree of right heart failure. 3. Small layering pleural effusions greater on the right. No other acute pulmonary process. 4. Mild postoperative changes about the left chest cardiac pacemaker. Electronically Signed   By: Odessa Fleming M.D.   On: 05/17/2018 18:19   US Thyroid  Result Date: 05/25/2018 CLINICAL DATA:  Other.  Abnormal TSH. EXAM: THYROID ULTRASOUND TECHNIQUE: Ultrasound examination of the thyroid gland and adjacent soft tissues was performed. COMPARISON:  None. FINDINGS: Parenchymal Echotexture: Mildly heterogenous Isthmus: Normal in size measures 0.3 cm in diameter Right lobe: Normal in size measuring 4.2 x 1.4 x 1.4 cm Left lobe: Normal in size measuring 4.2 x 2.3 x 1.1 cm _________________________________________________________ Estimated total number of nodules >/= 1 cm: 1 Number of spongiform nodules >/=  2 cm not described below (TR1): 0 Number of mixed cystic and solid nodules >/= 1.5 cm not described below (TR2): 0 _________________________________________________________ Nodule # 1: Location: Left; Mid Maximum size: 0.7 cm; Other 2 dimensions: 0.7 x 0.6 cm Composition: solid/almost completely solid (2) Echogenicity: hypoechoic (2) Shape: not taller-than-wide (0) Margins: smooth (0) Echogenic foci: none (0) ACR TI-RADS total points: 4. ACR TI-RADS risk category: TR4 (4-6 points). ACR TI-RADS recommendations: Given size (<0.9 cm) and appearance, this nodule does NOT meet TI-RADS criteria for biopsy or dedicated follow-up. _________________________________________________________ Note is made of an adjacent approximately 0.5 x 0.3 cm anechoic cyst within the posterior, mid aspect of the left lobe of the thyroid which does not meet imaging criteria to recommend percutaneous sampling or continued dedicated follow-up. IMPRESSION: No worrisome thyroid nodules. Percutaneous sampling and/or continued dedicated  follow-up is not recommended. The above is in keeping with the ACR TI-RADS recommendations - J Am Coll Radiol 2017;14:587-595. Electronically Signed   By: Simonne Come M.D.   On: 05/25/2018 14:37   Disposition   Pt is being discharged home today in good condition.  Follow-up Plans & Appointments    Follow-up Information    [facility] [address] [facility] Follow up on 06/16/2018.   Specialty:  Cardiology Why:  Your follow-up pacer check will be on 06/16/2018 at 12pm. Contact information: [address], [address] [place] [address] [phone]       Duke Salvia, MD Follow up on 07/12/2018.   Specialty:  Cardiology Why:  Your follow up with Dr. Graciela Husbands will be on 07/12/18 at 2pm. Please arrive by 145pm.  Contact information: [address] [place] [address] [phone]          Discharge Instructions    (HEART FAILURE PATIENTS) Call MD:  Anytime you have any of the following symptoms: 1) 3 pound weight gain in 24 hours or 5 pounds in 1 week 2) shortness of breath, with or without a dry hacking cough 3) swelling in the hands, feet or stomach 4) if you have to sleep on extra pillows at night in order to breathe.   Complete by:  As directed    Call MD for:  difficulty breathing, headache or visual disturbances   Complete by:  As directed    Call MD for:  persistant dizziness or  light-headedness   Complete by:  As directed    Call MD for:  persistant nausea and vomiting   Complete by:  As directed    Call MD for:  redness, tenderness, or signs of infection (pain, swelling, redness, odor or green/yellow discharge around incision site)   Complete by:  As directed    Call MD for:  severe uncontrolled pain   Complete by:  As directed    Call MD for:  temperature >100.4   Complete by:  As directed    Diet - low sodium heart healthy   Complete by:  As directed    Discharge instructions   Complete by:  As directed    You will need to have  a pacemaker check on 06/16/18. At that appointment, you will need to have your blood drawn at the lab.   Please resume your home dose of Coumadin 2.5mg  tonight and then your normal regimen of 3.75mg  on M, W, F, and Sat and 2.5mg  on other days. Please have your INR checked in the next week.   Increase activity slowly   Complete by:  As directed      Discharge Medications   Allergies as of 06/09/2018      Reactions   Codeine Nausea Only   Amiodarone Other (See Comments)   Neuropathy and skin discoloration      Medication List    STOP taking these medications   diltiazem 180 MG 24 hr capsule Commonly known as:  CARDIZEM CD   metoprolol tartrate 50 MG tablet Commonly known as:  LOPRESSOR     TAKE these medications   atorvastatin 10 MG tablet Commonly known as:  LIPITOR TAKE ONE TABLET BY MOUTH EVERY EVENING.   carbamazepine 100 MG 12 hr tablet Commonly known as:  TEGRETOL XR Take 1 tablet by mouth twice daily (morning, bedtime) What changed:    how much to take  how to take this  when to take this  additional instructions   carvedilol 6.25 MG tablet Commonly known as:  COREG Take 1 tablet (6.25 mg total) by mouth 2 (two) times daily with a meal.   furosemide 40 MG tablet Commonly known as:  LASIX Take 1 tablet (40 mg total) by mouth daily.   mirtazapine 15 MG tablet Commonly known as:  REMERON Take 1 tablet (15 mg total) by mouth at bedtime.   pantoprazole 40 MG tablet Commonly known as:  PROTONIX Take 40 mg by mouth daily.   potassium chloride 10 MEQ tablet Commonly known as:  K-DUR,KLOR-CON TAKE ONE TABLET BY MOUTH TWICE DAILY. (MORNING ,BEDTIME)   Vitamin D (Ergocalciferol) 50000 units Caps capsule Commonly known as:  DRISDOL TAKE ONE CAPSULE BY MOUTH EVERY 7 DAYS (FRIDAY MORNING)   warfarin 2.5 MG tablet Commonly known as:  COUMADIN Take as directed. If you are unsure how to take this medication, talk to your nurse or doctor. Original  instructions:  TAKE AS DIRECTED BY THE ANTICOAGULATION CLINIC What changed:  See the new instructions.        Acute coronary syndrome (MI, NSTEMI, STEMI, etc) this admission?: No.    Outstanding Labs/Studies   Will plan for BMET follow up at her pacer check appointment 06/16/18.   Duration of Discharge Encounter   Greater than 30 minutes including physician time.  Signed, Georgie Chard, NP 06/09/2018, 12:33 PM

## 2018-06-09 NOTE — Progress Notes (Addendum)
ANTICOAGULATION CONSULT NOTE  Pharmacy Consult for warfarin Indication: atrial fibrillation and pulmonary embolus (hx)  Allergies  Allergen Reactions  . Codeine Nausea Only  . Amiodarone Other (See Comments)    Neuropathy and skin discoloration     Patient Measurements: Height: 5\' 5"  (165.1 cm) Weight: 166 lb 3.2 oz (75.4 kg) IBW/kg (Calculated) : 57 Heparin Dosing Weight: 72.2 kg  Vital Signs: Temp: 97.6 F (36.4 C) (07/11 0435) Temp Source: Oral (07/11 0435) BP: 117/75 (07/11 0902) Pulse Rate: 80 (07/11 0902)  Labs: Recent Labs    06/06/18 1515  06/07/18 0614 06/08/18 0538 06/08/18 0948 06/09/18 0604  HGB 12.7  --  11.9*  --   --   --   HCT 41.3  --  37.6  --   --   --   PLT 145*  --  135*  --   --   --   LABPROT  --    < > 20.3* 22.5*  --  24.4*  INR  --    < > 1.75 2.00  --  2.21  CREATININE 1.38*  --  1.50*  --  1.63* 1.94*  TROPONINI 0.10*  --   --   --   --   --    < > = values in this interval not displayed.    Estimated Creatinine Clearance: 22.7 mL/min (A) (by C-G formula based on SCr of 1.94 mg/dL (H)).   Medical History: Past Medical History:  Diagnosis Date  . Acute on chronic diastolic (congestive) heart failure (HCC)   . Anxiety   . Atrial fibrillation with RVR (HCC) 12/01/2016  . CKD (chronic kidney disease), stage III (HCC) 12/12/2016  . Depression   . Dysphagia   . GERD (gastroesophageal reflux disease) 12/01/2016  . Grand mal seizure [facility])    "none since ~ 2005; on daily RX" (06/08/2018)  . Heart murmur   . History of esophageal stricture 12/10/2016  . Hyperlipidemia   . Hypertension    x 4 months  . Migraine    "bad one til I reached age [age]; none since" (06/08/2018)  . PONV (postoperative nausea and vomiting)   . Presbyesophagus 12/11/2016  . Presence of permanent cardiac pacemaker   . Pulmonary emboli (HCC) 12/01/2016  . Pulmonary emphysema (HCC) 09/06/2017  . Pulmonary nodule   . Stroke [facility]) ~ 2018   "in my left eye"      Medications:  Scheduled:  . atorvastatin  10 mg Oral QPM  . carbamazepine  100 mg Oral BID  . carvedilol  6.25 mg Oral BID WC  . mirtazapine  15 mg Oral QHS  . pantoprazole  40 mg Oral Daily  . potassium chloride  20 mEq Oral Daily  . sodium chloride flush  3 mL Intravenous Q12H  . Warfarin - Pharmacist Dosing Inpatient   Does not apply q1800    Assessment: 84 yo female on chronic Coumadin for hx afib and PE admitted with pacemaker issue. Pharmacy asked to continue coumadin. INR low at 1.68 on admit, now therapeutic at 2.21 after 2 days of boosted doses. Home dose resumed 7/10. Noted DDI with carbamazepine (PTA med). CBC trend down slightly - last 7/9. No bleed documented.  PTA warfarin dose: 3.75 mg on MWFSa, 2.5 mg TTSu (last dose 7/8 pta)   Goal of Therapy:  INR 2-3 Monitor platelets by anticoagulation protocol: Yes   Plan:  1. Warfarin 2.5mg  PO x 1 2. Daily INR. 3. Monitor s/sx bleeding, CBC  Rolly Salter  Francoise Schaumann, PharmD, BCPS Clinical Pharmacist Clinical phone [phone] Please check AMION for all [facility] contact numbers 06/09/2018 9:46 AM

## 2018-06-09 NOTE — Progress Notes (Signed)
Patient discharge to home, alert and oriented, no complaints of any pain of discomfort. PIV removed bu NT. D/c instructions and follow up appointments discussed with patient and verbalized understanding. Husband at bedside.

## 2018-06-09 NOTE — Telephone Encounter (Signed)
Spoke with [name], pt's daughter, to clarify the request for a second opinion. Daughter stated her cousin and father are the one's requesting a second opinion. They feel like pt has been declining and do not understand why. [name] and her father have questions regarding POC and potential interventions regarding her PPM and RV lead. Pt's daughter is also concerned with balance issues and stated she will contact her mother's PCP for a neurology consult. Pt's daughter would also like her mother to be discharged to a rehab facility before returning home.   I advised pt I would speak with Dr Graciela Husbands and clarify POC. I will contact her back later in the day. [name] agreed to plan and had no additional questions.

## 2018-06-09 NOTE — Discharge Instructions (Signed)
Please re-start your Lasix on Saturday 06/11/18 at your regular dose of 40 mg by mouth daily.

## 2018-06-09 NOTE — Progress Notes (Addendum)
Progress Note  Patient Name: Karen Richardson Date of Encounter: 06/09/2018  Primary Cardiologist: Prentice Docker, MD  Subjective   Pt feeling tired today. Reports no sleep while in the hospital is her biggest complaint.   Inpatient Medications    Scheduled Meds: . atorvastatin  10 mg Oral QPM  . carbamazepine  100 mg Oral BID  . carvedilol  6.25 mg Oral BID WC  . mirtazapine  15 mg Oral QHS  . pantoprazole  40 mg Oral Daily  . potassium chloride  20 mEq Oral Daily  . sodium chloride flush  3 mL Intravenous Q12H  . Warfarin - Pharmacist Dosing Inpatient   Does not apply q1800   Continuous Infusions: . sodium chloride     PRN Meds: sodium chloride, acetaminophen, ondansetron (ZOFRAN) IV, sodium chloride flush   Vital Signs    Vitals:   06/08/18 1951 06/08/18 2350 06/09/18 0435 06/09/18 0902  BP: 102/71 100/66 115/81 117/75  Pulse: 81 80 80 80  Resp: 18 16 18 16   Temp: 98.3 F (36.8 C) 98.2 F (36.8 C) 97.6 F (36.4 C)   TempSrc: Oral Oral Oral   SpO2: 94% 95% 95% 96%  Weight:   166 lb 3.2 oz (75.4 kg)   Height:        Intake/Output Summary (Last 24 hours) at 06/09/2018 0948 Last data filed at 06/09/2018 0926 Gross per 24 hour  Intake 960 ml  Output 1300 ml  Net -340 ml   Filed Weights   06/07/18 0428 06/08/18 0209 06/09/18 0435  Weight: 163 lb 0.6 oz (74 kg) 165 lb 9.6 oz (75.1 kg) 166 lb 3.2 oz (75.4 kg)   Physical Exam   General: Well developed, well nourished, NAD Skin: Warm, dry, intact  Head: Normocephalic, atraumatic, clear, moist mucus membranes. Neck: Negative for carotid bruits. + JVD Lungs:Clear to ausculation bilaterally. No wheezes, rales, or rhonchi. Breathing is unlabored. Cardiovascular: RRR with S1 S2. No murmurs, rubs or gallops Abdomen: Soft, non-tender, non-distended with normoactive bowel sounds. No obvious abdominal masses. MSK: Strength and tone appear normal for age. 5/5 in all extremities Extremities: No edema. No clubbing  or cyanosis. DP/PT pulses 2+ bilaterally Neuro: Alert and oriented. No focal deficits. No facial asymmetry. MAE spontaneously. Psych: Responds to questions appropriately with normal affect.    Labs    Chemistry Recent Labs  Lab 06/07/18 [phone] 06/08/18 0948 06/09/18 0604  NA 141 142 139  K 4.3 4.0 4.4  CL 107 108 102  CO2 29 25 25   GLUCOSE 106* 134* 103*  BUN 24* 24* 32*  CREATININE 1.50* 1.63* 1.94*  CALCIUM 8.8* 8.7* 8.6*  GFRNONAA 31* 28* 23*  GFRAA 36* 33* 27*  ANIONGAP 5 9 12      Hematology Recent Labs  Lab 06/06/18 1515 06/07/18 0614  WBC 5.5 5.8  RBC 4.18 3.87  HGB 12.7 11.9*  HCT 41.3 37.6  MCV 98.8 97.2  MCH 30.4 30.7  MCHC 30.8 31.6  RDW 14.3 14.4  PLT 145* 135*    Cardiac Enzymes Recent Labs  Lab 06/06/18 1515  TROPONINI 0.10*   No results for input(s): TROPIPOC in the last 168 hours.   BNP Recent Labs  Lab 06/06/18 1515  BNP 1,222.1*     DDimer No results for input(s): DDIMER in the last 168 hours.   Radiology    No results found.  Telemetry    06/09/18 V paced HR 80 - Personally Reviewed  ECG    V  paced HR 80 - Personally Reviewed  Cardiac Studies   None   Patient Profile     84 y.o. female admitted 06/06/18 with acute chronic CHF following av nodal ablation last week for uncotrolled Afib and 2/2 rate related myopathy.  Pt with a hx of chronic diastolic heart failure, persistent atrial fibrillation, prior CVA, GERD, CKD, and prior PE  Assessment & Plan    1. Acute on chronic systolic heart failure: -Per echocardiogram 05/18/18, LVEF 30-35% with NWM -Appeared to be fluid volume overloaded on exam 06/07/18>>>was given two additional doses of Lasix 40mg  BID. Lasix 40mg  BID continued for one additional day  -Weight, 166lb today, 164lb on admission  -I&O, net negative 1.1L, 24H total output 2.1L -Creatinine, elevated today at 1.94>>>up from 1.63 yesterday -As renal function normalizes, will consider ACE-I/ARB for low  EF -Lopressor changed to carvedilol 6.25 -Will hold lasix for now given abrupt rise in renal function and discuss with MD. On home Lasix 40 mg daily   2. Diaphragmatic stim: -RV threshold 0.5V@0 . . Device reprogrammed to 2V@1msec  today to eliminate diaphragmatic stim -Reprogrammed per EP>>>reprogram her device from 90--80 and activate rate response  3. Persistent atrial fibrillation: -s/p recent AVN ablation >>>EP following  -Continue carvedilol, Coumadin per pharmacy>>>Coumadin 2.5mg  x1  -diltiazem stopped yesterday per EP  -CHA2DS2VASc =5   4. Deconditioning:  -PT/OT evaluation>>>no follow up OT recommended  -Ambulating more without complication    5. Acute on chronic kidney disease stage III: -Creatinine, 1.94 today, up from 1.63 yesterday  -Baseline appears to be in the 1.3-1.5 range  -Likely she is at her dry weight and no additional Lasix needed at this point   Signed, Georgie Chard NP-C [facility] Pager: [phone] 06/09/2018, 9:48 AM     For questions or updates, please contact   Please consult [email or web address] for contact info under Cardiology/STEMI.  The patient was seen, examined and discussed with Georgie Chard, NP  and I agree with the above.   She is euvolemic, Crea up from 1.5->1.6->1.9, we will hold lasix today and tomorrow and restart on Saturday 06/11/18 with lasix 40 mg po daily. She is scheduled to have a PM check on 7/18, we will arrange for a BMP at the time of PM check. An appointment with Dr Graciela Husbands is scheduled for 07/12/18. We will discharge today.  Tobias Alexander, MD 06/09/2018

## 2018-06-10 ENCOUNTER — Encounter: Payer: Self-pay | Admitting: Family Medicine

## 2018-06-10 ENCOUNTER — Telehealth: Payer: Self-pay

## 2018-06-10 DIAGNOSIS — F329 Major depressive disorder, single episode, unspecified: Secondary | ICD-10-CM | POA: Diagnosis not present

## 2018-06-10 DIAGNOSIS — N183 Chronic kidney disease, stage 3 (moderate): Secondary | ICD-10-CM | POA: Diagnosis not present

## 2018-06-10 DIAGNOSIS — G40909 Epilepsy, unspecified, not intractable, without status epilepticus: Secondary | ICD-10-CM | POA: Diagnosis not present

## 2018-06-10 DIAGNOSIS — I130 Hypertensive heart and chronic kidney disease with heart failure and stage 1 through stage 4 chronic kidney disease, or unspecified chronic kidney disease: Secondary | ICD-10-CM | POA: Diagnosis not present

## 2018-06-10 DIAGNOSIS — J439 Emphysema, unspecified: Secondary | ICD-10-CM | POA: Diagnosis not present

## 2018-06-10 DIAGNOSIS — F419 Anxiety disorder, unspecified: Secondary | ICD-10-CM | POA: Diagnosis not present

## 2018-06-10 DIAGNOSIS — I5033 Acute on chronic diastolic (congestive) heart failure: Secondary | ICD-10-CM | POA: Diagnosis not present

## 2018-06-10 DIAGNOSIS — Z48812 Encounter for surgical aftercare following surgery on the circulatory system: Secondary | ICD-10-CM | POA: Diagnosis not present

## 2018-06-10 DIAGNOSIS — I481 Persistent atrial fibrillation: Secondary | ICD-10-CM | POA: Diagnosis not present

## 2018-06-10 NOTE — Telephone Encounter (Signed)
[id number]  Attempted to contact patient to complete [facility] telephone call. No answer. Left message requesting call back. Will try again later. 1st attempt

## 2018-06-13 ENCOUNTER — Other Ambulatory Visit: Payer: Self-pay | Admitting: *Deleted

## 2018-06-13 ENCOUNTER — Encounter: Payer: Self-pay | Admitting: Internal Medicine

## 2018-06-13 NOTE — Telephone Encounter (Signed)
Transition Care Management Follow-up Telephone Call   Date discharged? 06/09/18              How have you been since you were released from the hospital? Tired. Wore out. Can't walk to the bathroom without being completely wore out.    Do you understand why you were in the hospital? Yes. Palpitations. Something went wrong with the pacemaker   Do you understand the discharge instructions? yes  Where were you discharged to? home  Items Reviewed:  Medications reviewed: yes  Allergies reviewed: yes  Dietary changes reviewed: yes        Referrals reviewed: no new referrals   Functional Questionnaire:   Activities of Daily Living (ADLs):  has help if she needs it. Has help 3 days a week    Any transportation issues/concerns?: no  Any patient concerns?  Patient can't sleep. She thinks she needs oxygen at night. She has to sit up on side of the bed and lean head over to breath several times a night.   Confirmed importance and date/time of follow-up visits scheduled  Yes. Transferred to front staff to schedule TOC follow up visit.    Confirmed with patient if condition begins to worsen call PCP or go to the ER.  Patient was given the office number and encouraged to call back with question or concerns.  yes. With verbal understanding.

## 2018-06-14 ENCOUNTER — Other Ambulatory Visit: Payer: Self-pay

## 2018-06-14 ENCOUNTER — Ambulatory Visit: Payer: Medicare HMO | Admitting: Internal Medicine

## 2018-06-14 ENCOUNTER — Ambulatory Visit (INDEPENDENT_AMBULATORY_CARE_PROVIDER_SITE_OTHER): Payer: Medicare HMO

## 2018-06-14 ENCOUNTER — Encounter: Payer: Self-pay | Admitting: Internal Medicine

## 2018-06-14 VITALS — BP 122/82 | HR 80 | Ht 65.0 in | Wt 164.8 lb

## 2018-06-14 DIAGNOSIS — I482 Chronic atrial fibrillation, unspecified: Secondary | ICD-10-CM

## 2018-06-14 DIAGNOSIS — N183 Chronic kidney disease, stage 3 (moderate): Secondary | ICD-10-CM | POA: Diagnosis not present

## 2018-06-14 DIAGNOSIS — Z5181 Encounter for therapeutic drug level monitoring: Secondary | ICD-10-CM | POA: Diagnosis not present

## 2018-06-14 DIAGNOSIS — F419 Anxiety disorder, unspecified: Secondary | ICD-10-CM | POA: Diagnosis not present

## 2018-06-14 DIAGNOSIS — F329 Major depressive disorder, single episode, unspecified: Secondary | ICD-10-CM | POA: Diagnosis not present

## 2018-06-14 DIAGNOSIS — I4891 Unspecified atrial fibrillation: Secondary | ICD-10-CM

## 2018-06-14 DIAGNOSIS — I481 Persistent atrial fibrillation: Secondary | ICD-10-CM | POA: Diagnosis not present

## 2018-06-14 DIAGNOSIS — Z7901 Long term (current) use of anticoagulants: Secondary | ICD-10-CM

## 2018-06-14 DIAGNOSIS — I130 Hypertensive heart and chronic kidney disease with heart failure and stage 1 through stage 4 chronic kidney disease, or unspecified chronic kidney disease: Secondary | ICD-10-CM | POA: Diagnosis not present

## 2018-06-14 DIAGNOSIS — I5033 Acute on chronic diastolic (congestive) heart failure: Secondary | ICD-10-CM | POA: Diagnosis not present

## 2018-06-14 DIAGNOSIS — Z950 Presence of cardiac pacemaker: Secondary | ICD-10-CM

## 2018-06-14 DIAGNOSIS — J439 Emphysema, unspecified: Secondary | ICD-10-CM | POA: Diagnosis not present

## 2018-06-14 DIAGNOSIS — Z48812 Encounter for surgical aftercare following surgery on the circulatory system: Secondary | ICD-10-CM | POA: Diagnosis not present

## 2018-06-14 DIAGNOSIS — G40909 Epilepsy, unspecified, not intractable, without status epilepticus: Secondary | ICD-10-CM | POA: Diagnosis not present

## 2018-06-14 LAB — CUP PACEART INCLINIC DEVICE CHECK
Battery Remaining Longevity: 133 mo
Battery Voltage: 3.08 V
Brady Statistic RV Percent Paced: 2.3 %
Date Time Interrogation Session: 20190625204950
Implantable Lead Implant Date: 20190610
Implantable Lead Location: 753860
Implantable Lead Model: 5076
Implantable Pulse Generator Implant Date: 20190610
Lead Channel Impedance Value: 512.5 Ohm
Lead Channel Pacing Threshold Amplitude: 0.75 V
Lead Channel Pacing Threshold Amplitude: 0.75 V
Lead Channel Pacing Threshold Pulse Width: 0.5 ms
Lead Channel Pacing Threshold Pulse Width: 0.5 ms
Lead Channel Sensing Intrinsic Amplitude: 12 mV
Lead Channel Setting Pacing Amplitude: 3.5 V
Lead Channel Setting Pacing Pulse Width: 0.5 ms
Lead Channel Setting Sensing Sensitivity: 2 mV
Pulse Gen Model: 1272
Pulse Gen Serial Number: 9007184

## 2018-06-14 LAB — POCT INR: INR: 1.7 — AB (ref 2.0–3.0)

## 2018-06-14 MED ORDER — TORSEMIDE 20 MG PO TABS: 40.0000 mg | ORAL_TABLET | Freq: Every day | ORAL | 3 refills | Status: DC

## 2018-06-14 NOTE — Patient Instructions (Addendum)
  Description   Take coumadin 2 tablets today, then resume same dosage 1.5 tablets daily except 1 tablet on Tuesdays, Thursdays and Sundays Recheck in 2 weeks

## 2018-06-14 NOTE — Patient Instructions (Addendum)
Medication Instructions:  Your physician has recommended you make the following change in your medication:   1. Stop Lasix (furosemide) 2. Begin Torsemide, 40mg , one tablet once per day  Labwork: None ordered.  Testing/Procedures: None ordered.  Follow-Up: Your physician recommends that you schedule a follow-up appointment  With Dr Purvis Sheffield in our [place] office next week.  Any Other Special Instructions Will Be Listed Below (If Applicable).  Torsemide tablets What is this medicine? TORSEMIDE (TORE se mide) is a diuretic. It helps you make more urine and to lose salt and excess water from your body. This medicine is used to treat high blood pressure, and edema or swelling from heart, kidney, or liver disease. This medicine may be used for other purposes; ask your health care provider or pharmacist if you have questions. COMMON BRAND NAME(S): Demadex What should I tell my health care provider before I take this medicine? They need to know if you have any of these conditions: -abnormal blood electrolytes -diabetes -gout -heart disease -kidney disease -liver disease -small amounts of urine, or difficulty passing urine -an unusual or allergic reaction to torsemide, sulfa drugs, other medicines, foods, dyes, or preservatives -pregnant or trying to get pregnant -breast-feeding How should I use this medicine? Take this medicine by mouth with a glass of water. Follow the directions on the prescription label. You may take this medicine with or without food. If it upsets your stomach, take it with food or milk. Do not take your medicine more often than directed. Remember that you will need to pass more urine after taking this medicine. Do not take your medicine at a time of day that will cause you problems. Do not take at bedtime. Talk to your pediatrician regarding the use of this medicine in children. Special care may be needed. Overdosage: If you think you have taken too much of this  medicine contact a poison control center or emergency room at once. NOTE: This medicine is only for you. Do not share this medicine with others. What if I miss a dose? If you miss a dose, take it as soon as you can. If it is almost time for your next dose, take only that dose. Do not take double or extra doses. What may interact with this medicine? -alcohol -certain antibiotics given by injection certain heart medicines like digoxin -diuretics -lithium -medicines for diabetes -medicines for blood pressure -medicines for cholesterol like cholestyramine -medicines that relax muscles for surgery -NSAIDs, medicines for pain and inflammation, like ibuprofen or naproxen -OTC supplements like ginseng and ephedra -probenecid -steroid medicines like prednisone or cortisone This list may not describe all possible interactions. Give your health care provider a list of all the medicines, herbs, non-prescription drugs, or dietary supplements you use. Also tell them if you smoke, drink alcohol, or use illegal drugs. Some items may interact with your medicine. What should I watch for while using this medicine? Visit your doctor or health care professional for regular checks on your progress. Check your blood pressure regularly. Ask your doctor or health care professional what your blood pressure should be, and when you should contact him or her. If you are a diabetic, check your blood sugar as directed. You may need to be on a special diet while taking this medicine. Check with your doctor. Also, ask how many glasses of fluid you need to drink a day. You must not get dehydrated. You may get drowsy or dizzy. Do not drive, use machinery, or do anything that needs  mental alertness until you know how this drug affects you. Do not stand or sit up quickly, especially if you are an older patient. This reduces the risk of dizzy or fainting spells. Alcohol can make you more drowsy and dizzy. Avoid alcoholic  drinks. What side effects may I notice from receiving this medicine? Side effects that you should report to your doctor or health care professional as soon as possible: -allergic reactions such as skin rash or itching, hives, swelling of the lips, mouth, tongue or throat -blood in urine or stool -dry mouth -hearing loss or ringing in the ears -irregular heartbeat -muscle pain, weakness or cramps -pain or difficulty passing urine -unusually weak or tired -vomiting or diarrhea Side effects that usually do not require medical attention (report to your doctor or health care professional if they continue or are bothersome): -dizzy or lightheaded -headache -increased thirst -passing large amounts of urine -sexual difficulties -stomach pain, upset or nausea This list may not describe all possible side effects. Call your doctor for medical advice about side effects. You may report side effects to FDA at 1-800-FDA-1088. Where should I keep my medicine? Keep out of the reach of children. Store at room temperature between 15 and 30 degrees C (59 and 86 degrees F). Throw away any unused medicine after the expiration date. NOTE: This sheet is a summary. It may not cover all possible information. If you have questions about this medicine, talk to your doctor, pharmacist, or health care provider.  2018 Elsevier/Gold Standard (2008-08-02 11:35:45)     If you need a refill on your cardiac medications before your next appointment, please call your pharmacy.

## 2018-06-14 NOTE — Patient Outreach (Signed)
[facility]) Care Management  06/14/2018  Karen Richardson [birth year] [id number]  EMMI: heart failure red alert Referral date: 06/14/18 Referral reason: new problems: yes, New worsening problems: yes, New swelling: yes, New worsening shortness of breath: yes, tired / fatigued: yes, fever chills: yes.  Insurance: Medicare Day # 32  Patients transition of care is completed by her primary MD office.  Dr. Aliene Beams: primary MD Dr. Graciela Husbands: cardiology Dr. Sharene Skeans: cardiology  Telephone call to patient regarding EMMI heart failure red alert. HIPAA verified with patient. Patient gave persmission to speak with her spouse, Karen Richardson regarding her health information.  Spouse states patient has been admitted to the hospital 3 times within the past 1 1/2 month along with several emergency room visits. He states patient is receiving home care services with [facility].  He is concerned that she is not doing any better. He staets patient has had pacemaker placement. He reports patient is still  extremely fatigued and weak. Spouse states today patient is not feeling well at all. States she is very short of breath, weak and complains of being cold.  Spouse states they are overwhelmed with all of what is going on. Spouse states no on can really tell them what is going on with her and why she is not getting any better.  He states patient has trouble sleeping. States the doctor gave patient a medication that was suppose to help her with anxiety and help her sleep. Spouse  States the medication made patient " crazy" at first. States patient sits on the side of the bed at night with shortness of breath. Spouse states patient has a pacemaker check scheduled on 06/16/18, has a follow up with Dr. Graciela Husbands on 07/12/18 and a follow up with Dr. Tracie Harrier her primary MD on 07/13/18.  Marland Kitchen He states neither she nor him fully understand what's going on with her related to  Her conditions. Spouse states they are  aware she has congestive heart failure and atrial fibrillation.  Spouse states, " she can walk 20 ft and be completely worn out." . He is concerned the doctors are not really listening to their concerns. He states they are overwhelmed with all of the services, not understanding the process of what is happening with patient and she is still not getting better. He wants to see if patient is able to get oxygen as well.  Spouse states he hired someone 3 days per week to [employer] and do housekeeping.  Spouse states he is unable to complete call at this time. States he has received a call from his daughter stating patient can be seen by Dr. Graciela Husbands today.  RNCM discussed and offered [facility] care management services. Spouse verbally agreed to services for patient.   PLAN: RNCM will refer patient to community case manager  [name] Ina RN,BSN,CCM [facility] Telephonic  [phone]

## 2018-06-14 NOTE — Patient Outreach (Signed)
[facility]) Care Management  06/14/2018  AVLYN CLOUSER 16-Mar-1934 [id number]   Case closure EMMI:heart failure red alert Referral date:05/23/18, 05/24/18. 05/26/18. 05/30/18, 06/06/18 Referral reason:went to follow up appointment, Weight 169lbs, New worsening problems: yes, tired/ fatigued: yes, Lightheaded or dizzy: yes., Any new problems: yes, New swelling: yes Insurance: medicare Day #8and 11, 13, 23  No response from patient after several telephone calls and outreach letter attempt.   PLAN; Close patient due to being unable to reach.  Will send closure notification to patients primary MD   [name] Ina RN,BSN,CCM [facility] Telephonic  [phone]

## 2018-06-14 NOTE — Progress Notes (Signed)
Patient Care Team: Caren Macadam, MD as PCP - General (Family Medicine) Herminio Commons, MD as PCP - Cardiology (Cardiology) Neldon Labella, RN as Registered Nurse   HPI  Karen Richardson is a 84 y.o. female Seen after having met her in the hospital.  She was admitted because of atrial fibrillation with rapid rates.  Plan had been Tikosyn initiation; QT intervals were markedly prolonged prompting his discontinuation.  She has not tolerated amiodarone in the past.  She is known to have left atrial enlargement but developed severe LV dysfunction with a rapid rates.  She underwent pacing and subsequent AV junction ablation.  She was hospitalized a week after with acute on chronic heart failure.  She was diuresed.  Repeat echo had demonstrated interval improvement to the 40-45% range  DATE TEST EF   1/18 Echo   60-65 %   3/18 Echo   55-65 %   6/19   Echo  30-35%    7/19 Echo  40-45%    There is been a great deal of frustration with her lack of progress.  Multiple conversations with her daughter and her husband.  Delving into her history, she has had problems with severe dyspnea on and off over the last 2+ years.  There have been numerous occasions apparently when she has been so short of breath she has had to lie over the hood of the car.  Reviewing CT scans, she has been identified as having emphysema.  She is not aware of this diagnosis.  Her BMPs however over the recent months have been later than 2000.  She continues to complain of abdominal swelling anorexia and dyspnea.  Past Medical History:  Diagnosis Date  . Acute on chronic diastolic (congestive) heart failure ([place])   . Anxiety   . Atrial fibrillation with RVR ([place]) 12/01/2016  . CKD (chronic kidney disease), stage III ([place]) 12/12/2016  . Depression   . Dysphagia   . GERD (gastroesophageal reflux disease) 12/01/2016  . [place] mal seizure [facility])    "none since ~ 2005; on daily RX" (06/08/2018)  . Heart murmur   .  History of esophageal stricture 12/10/2016  . Hyperlipidemia   . Hypertension    x 4 months  . Migraine    "bad one til I reached age [age]; none since" (06/08/2018)  . PONV (postoperative nausea and vomiting)   . Presbyesophagus 12/11/2016  . Presence of permanent cardiac pacemaker   . Pulmonary emboli ([place]) 12/01/2016  . Pulmonary emphysema ([place]) 09/06/2017  . Pulmonary nodule   . Stroke [facility]) ~ 2018   "in my left eye"     Past Surgical History:  Procedure Laterality Date  . ABDOMINAL HYSTERECTOMY     "partial"  . AUGMENTATION MAMMAPLASTY Bilateral   . AV NODE ABLATION N/A 06/03/2018   Procedure: AV NODE ABLATION;  Surgeon: Deboraha Sprang, MD;  Location: [place] [facility];  Service: Cardiovascular;  Laterality: N/A;  . CARDIOVERSION N/A 05/03/2017   Procedure: CARDIOVERSION;  Surgeon: Acie Fredrickson Wonda Cheng, MD;  Location: [place];  Service: Cardiovascular;  Laterality: N/A;  . CATARACT EXTRACTION W/ INTRAOCULAR LENS IMPLANT Right 2019  . ESOPHAGEAL DILATION N/A 03/17/2017   Procedure: ESOPHAGEAL DILATION;  Surgeon: Rogene Houston, MD;  [facility];  Service: Endoscopy;  Laterality: N/A;  . ESOPHAGOGASTRODUODENOSCOPY N/A 03/17/2017   Procedure: ESOPHAGOGASTRODUODENOSCOPY (EGD);  Surgeon: Rogene Houston, MD;  [facility];  Service: Endoscopy;  Laterality: N/A;  10:30  . PACEMAKER IMPLANT N/A 05/09/2018   Procedure: PACEMAKER IMPLANT;  Surgeon: Deboraha Sprang, MD;  Location: [place] [facility];  Service: Cardiovascular;  Laterality: N/A;  . TEE WITHOUT CARDIOVERSION N/A 05/06/2018   Procedure: TRANSESOPHAGEAL ECHOCARDIOGRAM (TEE);  Surgeon: Fay Records, MD;  Location[facility] ENDOSCOPY;  Service: Cardiovascular;  Laterality: N/A;  . TUMOR EXCISION     from spinal cord ; benign    Current Meds  Medication Sig  . atorvastatin (LIPITOR) 10 MG tablet TAKE ONE TABLET BY MOUTH EVERY EVENING.  . carbamazepine (TEGRETOL XR) 100 MG 12 hr tablet Take 1 tablet by mouth twice  daily (morning, bedtime)  . carvedilol (COREG) 6.25 MG tablet Take 1 tablet (6.25 mg total) by mouth 2 (two) times daily with a meal.  . furosemide (LASIX) 40 MG tablet Take 1 tablet (40 mg total) by mouth daily.  . mirtazapine (REMERON) 15 MG tablet Take 1 tablet (15 mg total) by mouth at bedtime.  . pantoprazole (PROTONIX) 40 MG tablet Take 40 mg by mouth daily.  . potassium chloride (K-DUR,KLOR-CON) 10 MEQ tablet TAKE ONE TABLET BY MOUTH TWICE DAILY. (MORNING ,BEDTIME)  . Vitamin D, Ergocalciferol, (DRISDOL) 50000 units CAPS capsule TAKE ONE CAPSULE BY MOUTH EVERY 7 DAYS (FRIDAY MORNING)  . warfarin (COUMADIN) 2.5 MG tablet TAKE AS DIRECTED BY THE ANTICOAGULATION CLINIC (Patient taking differently: Take 3.75 mg by mouth daily on Monday, Wednesday, Friday and Saturday. Take 2.5 mg by mouth daily on all other days)    Allergies  Allergen Reactions  . Codeine Nausea Only  . Amiodarone Other (See Comments)    Neuropathy and skin discoloration       Review of Systems negative except from HPI and PMH  Physical Exam BP 122/82   Pulse 80   Ht _0  (1.651 m)   Wt 164 lb 12.8 oz (74.8 kg)   SpO2 95%   BMI 27.42 kg/m  Well developed and nourished in no acute distress HENT normal Neck supple with JVP-flat Clear Regular rate and rhythm, 2/6 systolic murmur  abd-soft with active BS No Clubbing cyanosis edema Skin-warm and dry A & Oriented  Grossly normal sensory and motor function  ECG Vpacing  Assessment and  Plan  Atrial fib with RVR  Cardiomyopathy presumably nonischemic  CHF chronic acute systolic   AV junction ablation   The patient continues to have severe shortness of breath her BNP suggests that heart failure is at least a significant portion of this.  With her abdominal swelling I wonder whether she is not absorbing furosemide.  We will try her on torsemide.  I continue to worry a little bit about asynchronous pacing although was somewhat mollified with the echo  last week showing interval improvement in LV function.  History also suggests that there is a long-standing chronic component which has been appreciated and may be related to her lung disease.  Pulmonary input may be of value.  Reviewing pulmonary notes he also describes restrictive disease.  We will ask her to follow-up with Dr. Bronson Ing in [place] in a couple of weeks to reassess heart failure.  We spent more than 50% of our >25 min visit in face to face counseling regarding the above   Current medicines are reviewed at length with the patient today .  The patient does not  have concerns regarding medicines.

## 2018-06-15 ENCOUNTER — Telehealth: Payer: Self-pay | Admitting: Internal Medicine

## 2018-06-15 NOTE — Telephone Encounter (Signed)
Pharmacist wanted clarification, Torsemide was ordered in place of pt's lasix. I confirmed order.

## 2018-06-15 NOTE — Telephone Encounter (Signed)
New MEssage   Pt c/o medication issue:  1. Name of Medication: torsemide (DEMADEX) 20 MG tablet  2. How are you currently taking this medication (dosage and times per day)? Take 2 tablets (40 mg total) by mouth daily  3. Are you having a reaction (difficulty breathing--STAT)? no  4. What is your medication issue? Myra at [facility] pharmacy state she needs clarification on this medication

## 2018-06-16 ENCOUNTER — Telehealth: Payer: Self-pay

## 2018-06-16 ENCOUNTER — Other Ambulatory Visit: Payer: Medicare HMO

## 2018-06-16 DIAGNOSIS — F329 Major depressive disorder, single episode, unspecified: Secondary | ICD-10-CM | POA: Diagnosis not present

## 2018-06-16 DIAGNOSIS — G40909 Epilepsy, unspecified, not intractable, without status epilepticus: Secondary | ICD-10-CM | POA: Diagnosis not present

## 2018-06-16 DIAGNOSIS — I481 Persistent atrial fibrillation: Secondary | ICD-10-CM | POA: Diagnosis not present

## 2018-06-16 DIAGNOSIS — J439 Emphysema, unspecified: Secondary | ICD-10-CM | POA: Diagnosis not present

## 2018-06-16 DIAGNOSIS — I5033 Acute on chronic diastolic (congestive) heart failure: Secondary | ICD-10-CM | POA: Diagnosis not present

## 2018-06-16 DIAGNOSIS — I130 Hypertensive heart and chronic kidney disease with heart failure and stage 1 through stage 4 chronic kidney disease, or unspecified chronic kidney disease: Secondary | ICD-10-CM | POA: Diagnosis not present

## 2018-06-16 DIAGNOSIS — N183 Chronic kidney disease, stage 3 (moderate): Secondary | ICD-10-CM | POA: Diagnosis not present

## 2018-06-16 DIAGNOSIS — Z48812 Encounter for surgical aftercare following surgery on the circulatory system: Secondary | ICD-10-CM | POA: Diagnosis not present

## 2018-06-16 DIAGNOSIS — F419 Anxiety disorder, unspecified: Secondary | ICD-10-CM | POA: Diagnosis not present

## 2018-06-16 LAB — CUP PACEART INCLINIC DEVICE CHECK
Battery Remaining Longevity: 93 mo
Battery Voltage: 3.07 V
Implantable Lead Implant Date: 20190610
Implantable Lead Model: 5076
Implantable Pulse Generator Implant Date: 20190610
Lead Channel Pacing Threshold Amplitude: 0.5 V
Lead Channel Pacing Threshold Pulse Width: 1 ms
Lead Channel Setting Pacing Pulse Width: 1 ms
Lead Channel Setting Sensing Sensitivity: 4 mV
MDC IDC LEAD LOCATION: 753860
MDC IDC MSMT LEADCHNL RV IMPEDANCE VALUE: 525 Ohm
MDC IDC PG SERIAL: 9007184
MDC IDC SESS DTM: 20190716200114
MDC IDC SET LEADCHNL RV PACING AMPLITUDE: 2 V
MDC IDC STAT BRADY RV PERCENT PACED: 99.97 %
Pulse Gen Model: 1272

## 2018-06-16 NOTE — Telephone Encounter (Signed)
-----   Message from Oretha Milch, RN sent at 06/14/2018  5:10 PM EDT ----- Regarding: call to see how pt feels with torsemide change Please call to follow up on her medication change

## 2018-06-16 NOTE — Telephone Encounter (Signed)
Called pt today. She began her torsemide today, not Tuesday. She states she has been urinating "a lot today." I advised her to keep taking her medication as directed. I will call her on Tuesday to see how her weekend went. I advised her that it was really important I speak with her and not her husband to get an accurate assessment as to how well she was. She agreed to plan.

## 2018-06-17 ENCOUNTER — Other Ambulatory Visit: Payer: Self-pay

## 2018-06-17 NOTE — Patient Outreach (Signed)
[facility]) Care Management  06/17/2018  Karen Richardson August 13, 1934 [id number]   Outreach call placed to Karen Richardson due to recent referral for [facility] Coordination and EMMI alert. Outreach Successful. HIPAA verifiers obtained. Karen Richardson stated " I didn't get any sleep last night and I'm just tired today." She reported that her lack of sleep was due to caring for multiple animals during the night. RN CM discussed symptoms of heart failure complications. She denied complaints of chest discomfort or persistent shortness of breath.   Member identified her spouse as her primary caretaker. She reported multiple pending appointments and verbalized concerns regarding finding a new PCP due to current PCP leaving. She agreed to initial home visit and continued outreach from [employer] CM.  PLAN RN CM will complete Initial home visit on 06/22/18.    Katha Cabal [facility] Community Care Manager [phone]

## 2018-06-20 ENCOUNTER — Encounter: Payer: Self-pay | Admitting: Internal Medicine

## 2018-06-20 ENCOUNTER — Other Ambulatory Visit: Payer: Self-pay

## 2018-06-20 ENCOUNTER — Telehealth: Payer: Self-pay | Admitting: Internal Medicine

## 2018-06-20 NOTE — Patient Outreach (Signed)
[facility]) Care Management  06/20/2018  Karen Richardson Dec 18, 1933 [id number]   RN CM contacted Karen Richardson after receipt of RED EMMI alert.HIPAA verifiers obtained. Karen Richardson reported frequent stools on yesterday and throughout the night. She stated "it's not diarrhea." She reported that she "feels good now." She denied symptoms of heart failure complications and declined a need for additional follow up or acute visit with MD.   Karen Richardson agreed to contact RN CM with concerns as needed.  PLAN RN CM will follow up on 06/22/18 for initial home visit.   Katha Cabal [facility] Community Care Manager [phone]

## 2018-06-20 NOTE — Telephone Encounter (Signed)
New Message        Pt c/o swelling: STAT is pt has developed SOB within 24 hours  1) How much weight have you gained and in what time span? 1/2 pounds  2) If swelling, where is the swelling located? Stomach  3) Are you currently taking a fluid pill? Yes  4) Are you currently SOB? No  5) Do you have a log of your daily weights (if so, list)? No  6) Have you gained 3 pounds in a day or 5 pounds in a week? No  7) Have you traveled recently? No             Patient was told to call Dr. Graciela Husbands if there was swelling in the stomach.

## 2018-06-21 ENCOUNTER — Telehealth: Payer: Self-pay | Admitting: Family Medicine

## 2018-06-21 ENCOUNTER — Telehealth: Payer: Self-pay | Admitting: Internal Medicine

## 2018-06-21 MED ORDER — TORSEMIDE 20 MG PO TABS: 40.0000 mg | ORAL_TABLET | Freq: Two times a day (BID) | ORAL | 0 refills | Status: DC

## 2018-06-21 MED ORDER — TORSEMIDE 20 MG PO TABS: 20.0000 mg | ORAL_TABLET | ORAL | 0 refills | Status: DC | PRN

## 2018-06-21 NOTE — Telephone Encounter (Signed)
New corrected order sent in per pt's pharmacist, "take 2 tablets per day with an additional 20mg  as needed for swelling." Pharmacist states pt will only receive a bottle with 5 pills per week as needed by pt. Pharmacy delivers pts medications weekly.

## 2018-06-21 NOTE — Telephone Encounter (Signed)
Lauren, a nurse from [facility] is requesting a call back before noon regarding patient. Cb# [phone]/ [phone]

## 2018-06-21 NOTE — Telephone Encounter (Signed)
They don't know if you are able to work her in or not this week. When the patient has to go see Dr.Kline they complain because it is [address]. I explained to her you are leaving. Lauren wanted to know if we could help find the patient a PCP and I told her there was a list on the bottom of the letter that mailed to her but because of the volume of the patients having to find new pcp's some of them are no longer accepting new patients. She stated she is willing to help [place] in any way she can with this patient.

## 2018-06-21 NOTE — Telephone Encounter (Signed)
Karen Richardson from [facility] called back about Torsemide refill. She states that prescription needs to say take 2 tablet by mouth every morning and may take additional tablet if needed. Karen Richardson's call back number is [phone]. She states to call it in for #90 with zero refills.

## 2018-06-21 NOTE — Telephone Encounter (Signed)
Lauren from Dr.Klein's office called stating that patient called this morning saying she had some diarrhea with mucus in it over the weekend. Patient wasn't tolerating one of her cardiac medications last weekend, so they changed her to torsemide and she told the patient her cardiac meds shouldn't be causing diarrhea.

## 2018-06-21 NOTE — Telephone Encounter (Signed)
Spoke with pt and pt's daughter. Pt has c/o abd swelling and diarrhea with mucous. Pt states she's been drinking "lots of water." When asked how much, pt stated about 3 or 4 20oz bottles per day. Pt states her diarrhea is only intermittent and currently is not bothering her. I reassured pt I did not think her medication was causing her diarrhea. I placed a call to her PCP to possible have her seen for her GI issues. Awaiting return call.  I have instructed pt to cut back on her water intake to 3/4th of what she normally intakes since she currently is not having diarrhea. Pt is to take an additional torsemide today and see if this helps with abd swelling. Pt's pharmacy delivers packets of medication. I called to verify/confirm pt is indeed taking torsemide, and not lasix. Pharmacist states pt brought in her old packets of medications and should be taking her torsemide. I have also sent in a one time, 20mg  Torsemide quantity of 30 pills, to her pharmacy so pt will not have to break open new packets.   Pt has agreed to plan. I will follow up with her later today.

## 2018-06-21 NOTE — Telephone Encounter (Signed)
New Message:       Pt c/o swelling: STAT is pt has developed SOB within 24 hours  1) How much weight have you gained and in what time span? 2 lbs  2) If swelling, where is the swelling located? stomach  3) Are you currently taking a fluid pill? yes  4) Are you currently SOB? yes  5) Do you have a log of your daily weights (if so, list)? No  6) Have you gained 3 pounds in a day or 5 pounds in a week? No  7) Have you traveled recently? No     Pt's spouse states they have changed the fluid pill dosage. Pt's spouse states nothing is helping. Pt's spouse states she has also had diarrhea for the last two days.

## 2018-06-21 NOTE — Telephone Encounter (Signed)
See encounter for 7/23

## 2018-06-21 NOTE — Telephone Encounter (Signed)
Please assist in getting appointment if needed.  Just give her names and numbers.  She can also call [place] patient care assistance and they will help her to get an appointment.  If she is having diarrhea or problems since being discharged from the hospital she needs to follow-up or be seen in the emergency department.

## 2018-06-22 ENCOUNTER — Other Ambulatory Visit: Payer: Self-pay

## 2018-06-22 DIAGNOSIS — G40909 Epilepsy, unspecified, not intractable, without status epilepticus: Secondary | ICD-10-CM | POA: Diagnosis not present

## 2018-06-22 DIAGNOSIS — I5033 Acute on chronic diastolic (congestive) heart failure: Secondary | ICD-10-CM | POA: Diagnosis not present

## 2018-06-22 DIAGNOSIS — Z48812 Encounter for surgical aftercare following surgery on the circulatory system: Secondary | ICD-10-CM | POA: Diagnosis not present

## 2018-06-22 DIAGNOSIS — I130 Hypertensive heart and chronic kidney disease with heart failure and stage 1 through stage 4 chronic kidney disease, or unspecified chronic kidney disease: Secondary | ICD-10-CM | POA: Diagnosis not present

## 2018-06-22 DIAGNOSIS — F419 Anxiety disorder, unspecified: Secondary | ICD-10-CM | POA: Diagnosis not present

## 2018-06-22 DIAGNOSIS — J439 Emphysema, unspecified: Secondary | ICD-10-CM | POA: Diagnosis not present

## 2018-06-22 DIAGNOSIS — N183 Chronic kidney disease, stage 3 (moderate): Secondary | ICD-10-CM | POA: Diagnosis not present

## 2018-06-22 DIAGNOSIS — F329 Major depressive disorder, single episode, unspecified: Secondary | ICD-10-CM | POA: Diagnosis not present

## 2018-06-22 DIAGNOSIS — I481 Persistent atrial fibrillation: Secondary | ICD-10-CM | POA: Diagnosis not present

## 2018-06-22 NOTE — Patient Outreach (Signed)
[facility]) Care Management   06/22/2018  Karen Richardson December 23, 1933 [id number]  Karen Richardson is an 84 y.o. female  Subjective:  Initial home visit complete. Member alert, oriented and ambulating outside at RN CM's arrival.  Objective:   Review of Systems  Constitutional: Positive for malaise/fatigue.  HENT: Negative.   Eyes: Negative.   Respiratory: Positive for cough. Negative for sputum production.   Cardiovascular: Negative for chest pain, palpitations and leg swelling.  Gastrointestinal: Negative.   Genitourinary: Negative.   Musculoskeletal: Negative for falls.       History of falls. No falls reported in the past week.  Skin: Negative.   Neurological: Positive for dizziness.  Endo/Heme/Allergies: Negative.   Psychiatric/Behavioral: Negative.     Physical Exam  Constitutional: She is oriented to person, place, and time. She appears well-developed.  Cardiovascular: Normal rate.  Respiratory: Effort normal.  GI: Soft. Bowel sounds are normal.  Musculoskeletal: Normal range of motion.  Neurological: She is alert and oriented to person, place, and time.  Skin: Skin is warm and dry.  Psychiatric: She has a normal mood and affect. Her behavior is normal. Judgment and thought content normal.    Encounter Medications:   Outpatient Encounter Medications as of 06/22/2018  Medication Sig Note  . atorvastatin (LIPITOR) 10 MG tablet TAKE ONE TABLET BY MOUTH EVERY EVENING.   . carbamazepine (TEGRETOL XR) 100 MG 12 hr tablet Take 1 tablet by mouth twice daily (morning, bedtime) 06/06/2018: LF 7/1 DS30  . carvedilol (COREG) 6.25 MG tablet Take 1 tablet (6.25 mg total) by mouth 2 (two) times daily with a meal.   . mirtazapine (REMERON) 15 MG tablet Take 1 tablet (15 mg total) by mouth at bedtime. 06/06/2018: Lf 6/28 DS30  . pantoprazole (PROTONIX) 40 MG tablet Take 40 mg by mouth daily. 06/06/2018: LF 7/1 DS28  . potassium chloride (K-DUR,KLOR-CON) 10 MEQ tablet TAKE ONE  TABLET BY MOUTH TWICE DAILY. (MORNING ,BEDTIME) 06/06/2018: LF 7/1 DS28  . torsemide (DEMADEX) 20 MG tablet Take 2 tablets (40 mg total) by mouth daily.   Marland Kitchen torsemide (DEMADEX) 20 MG tablet Take 2 tablets (40 mg total) by mouth 2 (two) times daily. Take one additional '20mg'$  tablet as needed for swelling.   . Vitamin D, Ergocalciferol, (DRISDOL) 50000 units CAPS capsule TAKE ONE CAPSULE BY MOUTH EVERY 7 DAYS (FRIDAY MORNING) 06/06/2018: LF 7/1 DS28  . warfarin (COUMADIN) 2.5 MG tablet TAKE AS DIRECTED BY THE ANTICOAGULATION CLINIC (Patient taking differently: Take 3.75 mg by mouth daily on Monday, Wednesday, Friday and Saturday. Take 2.5 mg by mouth daily on all other days) 06/06/2018: LF 7/2 DS5   No facility-administered encounter medications on file as of 06/22/2018.     Functional Status:   In your present state of health, do you have any difficulty performing the following activities: 06/22/2018 06/06/2018  Hearing? N N  Vision? N N  Difficulty concentrating or making decisions? - N  Walking or climbing stairs? Y N  Comment Patient reported being "off balance." Patient has difficulty using steps. -  Dressing or bathing? N N  Doing errands, shopping? Y N  Comment Requires assistance. -  Preparing Food and eating ? Y -  Using the Toilet? N -  In the past six months, have you accidently leaked urine? Y -  Do you have problems with loss of bowel control? N -  Managing your Finances? Y -  Housekeeping or managing your Housekeeping? Y -  Some recent data  might be hidden    Fall/Depression Screening:    Fall Risk  06/22/2018 05/27/2018 05/04/2018  Falls in the past year? Yes Yes Yes  Number falls in past yr: 2 or more 1 2 or more  Injury with Fall? Yes No Yes  Risk Factor Category  High Fall Risk - High Fall Risk  Risk for fall due to : History of fall(s);Impaired balance/gait Impaired balance/gait;Impaired mobility Impaired balance/gait;Impaired mobility  Follow up Falls prevention  discussed;Education provided - -   PHQ 2/9 Scores 06/22/2018 05/27/2018 05/04/2018 03/16/2018 12/17/2017 09/17/2017 06/17/2017  PHQ - 2 Score 0 2 0 0 0 0 0  PHQ- 9 Score - 11 - - - - -    Assessment:   RN CM met with Karen Richardson to complete initial home visit and discuss [place] Management services. Consent obtained. Member's spouse and personal care aide were present during the visit.  RED EMMI alert received prior to visit. Karen Richardson reported that she was fatigued due to staying up during the night. Karen Richardson denied symptoms of CHF complications and stated, "I just stayed up late to watch a movie." She reported earlier episodes of dizziness due to it being hot outdoors but denied falls.  Karen Richardson uses medication adherence packages. She reported medication compliance and denied concerns with medication affordability. She is aware of her pending MD appointments and reported that her husband provides transportation. She denied transportation needs at this time.   Discussed plan of care. RN CM will assist Karen Richardson find a new Primary Care Provider due to current MD leaving. Member agreed to continued outreach and will notify RN CM with questions and concerns as needed.  THN CM Care Plan Problem One     Most Recent Value  Care Plan Problem One  Risk for readmission   Role Documenting the Problem One  Care Management [place] for Problem One  Active  THN Long Term Goal   Patient will not have a hospitalization over the next 60 days.   THN Long Term Goal Start Date  06/22/18  Interventions for Problem One Long Term Goal  RN CM discussed signs and symptoms of complications related to heart failure. Discussed heart failure zones and indications for contacting MD.  THN CM Short Term Goal #1   Over the next 30 days patient will attend MD follow up appointments.  THN CM Short Term Goal #1 Start Date  06/22/18  Interventions for Short Term Goal #1  RN CM confirmed that follow up appointments  were scheduled and discussed importance of attending pending appointments.  THN CM Short Term Goal #2   Over the next 30 days patient will take medications as prescribed.  THN CM Short Term Goal #2 Start Date  06/22/18  Interventions for Short Term Goal #2  Educated patient on importance of medication compliance.    THN CM Care Plan Problem Two     Most Recent Value  Care Plan Problem Two  High risk for falls.  Role Documenting the Problem Two  Care Management [place] for Problem Two  Active  Interventions for Problem Two Long Term Goal   RN CM discussed fall prevention measures and safety precautions.   THN Long Term Goal  Patient will not fall over the next 60 days.  THN Long Term Goal Start Date  06/22/18  THN CM Short Term Goal #1   Over the next 30 days patient will use cane or walker  when ambulating inside and outside of the home.  THN CM Short Term Goal #1 Start Date  06/22/18  Interventions for Short Term Goal #2   Discussed importance of ambulating slowly and ensuring pathways are clear due to multiple pet dogs living in the home. Patient encouraged to use cane or walker when ambulating inside or outside of the home.  THN CM Short Term Goal #2   Over the next 30 days patient will avoid ambulating when feeling faint or dizzy.  THN CM Short Term Goal #2 Start Date  06/22/18  Interventions for Short Term Goal #2  Discussed high fall risk related to ambulating when feeling faint or dizzy.       PLAN RN CM will follow up on next week.   [place] [phone]

## 2018-06-22 NOTE — Telephone Encounter (Signed)
Spoke with patient and she stated she no longer has diarrhea but her stool has mucus in it. She is not interested in coming in. She adamantly refuses to go be seen at an urgent care clinic or the ED. She stated [facility] She states she will try to get back in with her old PCP and she if she can reestablish with them.

## 2018-06-24 DIAGNOSIS — G40909 Epilepsy, unspecified, not intractable, without status epilepticus: Secondary | ICD-10-CM | POA: Diagnosis not present

## 2018-06-24 DIAGNOSIS — J439 Emphysema, unspecified: Secondary | ICD-10-CM | POA: Diagnosis not present

## 2018-06-24 DIAGNOSIS — I481 Persistent atrial fibrillation: Secondary | ICD-10-CM | POA: Diagnosis not present

## 2018-06-24 DIAGNOSIS — I5033 Acute on chronic diastolic (congestive) heart failure: Secondary | ICD-10-CM | POA: Diagnosis not present

## 2018-06-24 DIAGNOSIS — Z48812 Encounter for surgical aftercare following surgery on the circulatory system: Secondary | ICD-10-CM | POA: Diagnosis not present

## 2018-06-24 DIAGNOSIS — N183 Chronic kidney disease, stage 3 (moderate): Secondary | ICD-10-CM | POA: Diagnosis not present

## 2018-06-24 DIAGNOSIS — F419 Anxiety disorder, unspecified: Secondary | ICD-10-CM | POA: Diagnosis not present

## 2018-06-24 DIAGNOSIS — F329 Major depressive disorder, single episode, unspecified: Secondary | ICD-10-CM | POA: Diagnosis not present

## 2018-06-24 DIAGNOSIS — I130 Hypertensive heart and chronic kidney disease with heart failure and stage 1 through stage 4 chronic kidney disease, or unspecified chronic kidney disease: Secondary | ICD-10-CM | POA: Diagnosis not present

## 2018-06-27 ENCOUNTER — Other Ambulatory Visit: Payer: Self-pay

## 2018-06-27 NOTE — Patient Outreach (Signed)
[facility]) Care Management  06/27/2018  RAENELLE CRYSLER Apr 03, 1934 [id number]   RN CM contacted Mrs. Zachery for routine outreach and to follow up on receipt of RED EMMI Alert.  Mrs. Banko's primary complaint was fatigue. She reported that it was due to being home alone on yesterday and having to get up frequently to let her dogs out. She denied complaints of chest pain but reported an episode of shortness of breath after waking up today. She reported that it was relieved after sitting up on the edge of the bed. She denied swelling or increased weight gain and reported her daily weight was 162 lbs. RN CM reviewed Heart Failure Action Plan, indications for MD notification, and indications for calling 911.  Also discussed possible enrollment in the [facility]Paramed[facility] program for additional outreach. Mrs. Woods declined an Acute Visit and does not want to be evaluated by the MD at this time. She stated, "I'll feel better after I eat and get some rest." She agreed to notify MD if symptoms change or call 911 for unrelieved shortness of breath or chest discomfort.   PLAN -RN CM will follow up with MD(Cardiology) -Will contact [facility]Paramed[facility] Coordinator regarding referral. -Will follow up with patient within 24 hours.   Katha Cabal [facility] Community Care Manager [phone]

## 2018-06-28 ENCOUNTER — Other Ambulatory Visit: Payer: Self-pay

## 2018-06-28 ENCOUNTER — Other Ambulatory Visit: Payer: Self-pay | Admitting: Family Medicine

## 2018-06-28 ENCOUNTER — Ambulatory Visit (INDEPENDENT_AMBULATORY_CARE_PROVIDER_SITE_OTHER): Payer: Medicare HMO | Admitting: *Deleted

## 2018-06-28 DIAGNOSIS — I4891 Unspecified atrial fibrillation: Secondary | ICD-10-CM | POA: Diagnosis not present

## 2018-06-28 DIAGNOSIS — I130 Hypertensive heart and chronic kidney disease with heart failure and stage 1 through stage 4 chronic kidney disease, or unspecified chronic kidney disease: Secondary | ICD-10-CM | POA: Diagnosis not present

## 2018-06-28 DIAGNOSIS — Z7901 Long term (current) use of anticoagulants: Secondary | ICD-10-CM | POA: Diagnosis not present

## 2018-06-28 DIAGNOSIS — Z5181 Encounter for therapeutic drug level monitoring: Secondary | ICD-10-CM | POA: Diagnosis not present

## 2018-06-28 DIAGNOSIS — F419 Anxiety disorder, unspecified: Secondary | ICD-10-CM | POA: Diagnosis not present

## 2018-06-28 DIAGNOSIS — F329 Major depressive disorder, single episode, unspecified: Secondary | ICD-10-CM | POA: Diagnosis not present

## 2018-06-28 DIAGNOSIS — I5033 Acute on chronic diastolic (congestive) heart failure: Secondary | ICD-10-CM | POA: Diagnosis not present

## 2018-06-28 DIAGNOSIS — J439 Emphysema, unspecified: Secondary | ICD-10-CM | POA: Diagnosis not present

## 2018-06-28 DIAGNOSIS — Z48812 Encounter for surgical aftercare following surgery on the circulatory system: Secondary | ICD-10-CM | POA: Diagnosis not present

## 2018-06-28 DIAGNOSIS — I481 Persistent atrial fibrillation: Secondary | ICD-10-CM | POA: Diagnosis not present

## 2018-06-28 DIAGNOSIS — N183 Chronic kidney disease, stage 3 (moderate): Secondary | ICD-10-CM | POA: Diagnosis not present

## 2018-06-28 DIAGNOSIS — G40909 Epilepsy, unspecified, not intractable, without status epilepticus: Secondary | ICD-10-CM | POA: Diagnosis not present

## 2018-06-28 LAB — POCT INR: INR: 1.5 — AB (ref 2.0–3.0)

## 2018-06-28 NOTE — Patient Outreach (Signed)
[facility]) Care Management  06/28/2018  MELIS LOVEALL January 19, 1934 [id number]   Follow up call complete. Mrs. Bouie's primary concern today was fatigue from ambulating. No falls reported. She denied complaints of shortness of breath or chest discomfort.  Stated she felt "increased pressure" while lying flat on her back last night. Reported that the pressure was relieved after repositioning. Mrs. Chevere reported her weight for today was 161 lbs. She is scheduled for Coumadin check in [place] later today.   Mrs. Appling denied urgent concerns and did not want to be evaluated in Urgent Care or MD office. She reported that she has a scheduled appointment with Cardiology on next week, and would like to address her concerns with the MD at that time. She is aware of referral to [facility]/Paramedicine and agreeable to outreach. She verbalized knowledge of calling 911 or reporting to nearest ED for unrelieved shortness of breath or chest discomfort.  PLAN RN CM will follow up this week.   Katha Cabal [facility] Community Care Manager [phone]

## 2018-06-28 NOTE — Patient Instructions (Signed)
Take coumadin 2 tablets tonight and tomorrow night then resume 1.5 tablets daily except 1 tablet on Tuesdays, Thursdays and Sundays Recheck in 2 weeks

## 2018-07-01 ENCOUNTER — Other Ambulatory Visit: Payer: Self-pay

## 2018-07-01 DIAGNOSIS — N183 Chronic kidney disease, stage 3 (moderate): Secondary | ICD-10-CM | POA: Diagnosis not present

## 2018-07-01 DIAGNOSIS — F419 Anxiety disorder, unspecified: Secondary | ICD-10-CM | POA: Diagnosis not present

## 2018-07-01 DIAGNOSIS — F329 Major depressive disorder, single episode, unspecified: Secondary | ICD-10-CM | POA: Diagnosis not present

## 2018-07-01 DIAGNOSIS — I481 Persistent atrial fibrillation: Secondary | ICD-10-CM | POA: Diagnosis not present

## 2018-07-01 DIAGNOSIS — J439 Emphysema, unspecified: Secondary | ICD-10-CM | POA: Diagnosis not present

## 2018-07-01 DIAGNOSIS — Z48812 Encounter for surgical aftercare following surgery on the circulatory system: Secondary | ICD-10-CM | POA: Diagnosis not present

## 2018-07-01 DIAGNOSIS — G40909 Epilepsy, unspecified, not intractable, without status epilepticus: Secondary | ICD-10-CM | POA: Diagnosis not present

## 2018-07-01 DIAGNOSIS — I5033 Acute on chronic diastolic (congestive) heart failure: Secondary | ICD-10-CM | POA: Diagnosis not present

## 2018-07-01 DIAGNOSIS — I130 Hypertensive heart and chronic kidney disease with heart failure and stage 1 through stage 4 chronic kidney disease, or unspecified chronic kidney disease: Secondary | ICD-10-CM | POA: Diagnosis not present

## 2018-07-03 ENCOUNTER — Encounter: Payer: Self-pay | Admitting: Cardiovascular Disease

## 2018-07-04 ENCOUNTER — Encounter: Payer: Self-pay | Admitting: Internal Medicine

## 2018-07-04 ENCOUNTER — Telehealth: Payer: Self-pay | Admitting: Cardiovascular Disease

## 2018-07-04 DIAGNOSIS — F329 Major depressive disorder, single episode, unspecified: Secondary | ICD-10-CM | POA: Diagnosis not present

## 2018-07-04 DIAGNOSIS — F419 Anxiety disorder, unspecified: Secondary | ICD-10-CM | POA: Diagnosis not present

## 2018-07-04 DIAGNOSIS — J439 Emphysema, unspecified: Secondary | ICD-10-CM | POA: Diagnosis not present

## 2018-07-04 DIAGNOSIS — Z48812 Encounter for surgical aftercare following surgery on the circulatory system: Secondary | ICD-10-CM | POA: Diagnosis not present

## 2018-07-04 DIAGNOSIS — I481 Persistent atrial fibrillation: Secondary | ICD-10-CM | POA: Diagnosis not present

## 2018-07-04 DIAGNOSIS — I130 Hypertensive heart and chronic kidney disease with heart failure and stage 1 through stage 4 chronic kidney disease, or unspecified chronic kidney disease: Secondary | ICD-10-CM | POA: Diagnosis not present

## 2018-07-04 DIAGNOSIS — G40909 Epilepsy, unspecified, not intractable, without status epilepticus: Secondary | ICD-10-CM | POA: Diagnosis not present

## 2018-07-04 DIAGNOSIS — N183 Chronic kidney disease, stage 3 (moderate): Secondary | ICD-10-CM | POA: Diagnosis not present

## 2018-07-04 DIAGNOSIS — I5033 Acute on chronic diastolic (congestive) heart failure: Secondary | ICD-10-CM | POA: Diagnosis not present

## 2018-07-04 NOTE — Telephone Encounter (Signed)
Call placed back to [facility].  Torsemide directions is 20mg  - 2 tabs twice a day with an additional 20mg  as needed.

## 2018-07-04 NOTE — Telephone Encounter (Signed)
Call from [facility] about patient's RX  torsemide (DEMADEX) 20 MG tablet need to clarify directions

## 2018-07-04 NOTE — Telephone Encounter (Signed)
New message     1. Are you currently SOB (can you hear that pt is SOB on the phone)? Yes, per husband  2. How long have you been experiencing SOB? 2-3 months  3. Are you SOB when sitting or when up moving around? Sitting and moving  4. Are you currently experiencing any other symptoms? Chest pain last week. Patient spouse states his wife starts to feel bad about 30 minutes after taking medication. Patient feels tired and weak. Spouse states no one is listening to his concerns regarding his wife. Requesting to speak only  Dr Odessa Fleming nurse only

## 2018-07-04 NOTE — Patient Outreach (Signed)
[facility]) Care Management  07/01/2018  CHRISTALYNN DOROUGH July 11, 1934 [id number]   RN CM contacted Mrs. Mulroy for routine outreach. Member's spouse reported that she was resting at the time of the call. No urgent concerns at this time.   PLAN RN CM will follow up on next week.   Katha Cabal [facility] Community Care Manager [phone]

## 2018-07-04 NOTE — Telephone Encounter (Signed)
Spoke with patient's husband who is frustrated with how poor his wife feels. He states she's fatigued, SOB, and has balance issues. Pt has not been taking her torsemide as directed. I advised pt's husband to take her torsemide as directed and follow up with Dr Junius Argyle office as planned on 8/7. Pt's husband stated "he'd try and get her to take her medicine."

## 2018-07-05 ENCOUNTER — Other Ambulatory Visit: Payer: Self-pay

## 2018-07-05 NOTE — Patient Outreach (Signed)
[facility]) Care Management  07/05/2018  ALEIAH BELFIELD [birth year] [id number]   Outreach call placed. Mrs. Bayles reported that she was resting at the time of the call and requested that her spouse speak with RN CM. Mr. Foos reported Mrs. Kleen weight as 160lbs. He reported that she did not have increased swelling but experienced episodes of shortness of breath and chest discomfort over the weekend. He reported that she did not want to be evaluated in the Urgent Care or ED. Per spouse, Mrs. Glanzer was not having episodes at the time of the call.   RN CM made additional request to speak to Mrs. Rhyne regarding her status today. Mrs. Lawes would not speak on the phone, but voice could be heard via speaker phone. Member requested to rest and verbalized that evaluation in the Emergency Department was not necessary. Mr. Devega verbalized that his wife did not need urgent evaluation today and her concerns would be addressed during her appointment with MD on tomorrow. RN CM discussed option for home visit/evaluation by Paramedicine staff today. Mrs. Babel was agreeable to visit but requested that medic come to her home after 2pm. RN CM discussed worsening signs/symptoms and indications for notifying 911 with Mr. Karras. He verbalized understanding and agreed to notify RN CM as needed.  PLAN RN CM will notify Paramedicine staff to arrange visit. RN CM will continue outreach with member.   Katha Cabal [facility] Community Care Manager [phone]

## 2018-07-06 ENCOUNTER — Encounter: Payer: Self-pay | Admitting: *Deleted

## 2018-07-06 ENCOUNTER — Encounter: Payer: Self-pay | Admitting: Internal Medicine

## 2018-07-06 ENCOUNTER — Other Ambulatory Visit: Payer: Self-pay

## 2018-07-06 ENCOUNTER — Ambulatory Visit (INDEPENDENT_AMBULATORY_CARE_PROVIDER_SITE_OTHER): Payer: Medicare HMO | Admitting: Cardiovascular Disease

## 2018-07-06 ENCOUNTER — Other Ambulatory Visit (HOSPITAL_COMMUNITY)
Admission: RE | Admit: 2018-07-06 | Discharge: 2018-07-06 | Disposition: A | Discharge status: Home / Self Care | Payer: Medicare HMO | Source: Ambulatory Visit | Attending: Cardiovascular Disease | Admitting: Cardiovascular Disease

## 2018-07-06 ENCOUNTER — Encounter: Payer: Self-pay | Admitting: Cardiovascular Disease

## 2018-07-06 VITALS — BP 120/78 | HR 72 | Ht 65.0 in | Wt 165.0 lb

## 2018-07-06 DIAGNOSIS — J449 Chronic obstructive pulmonary disease, unspecified: Secondary | ICD-10-CM

## 2018-07-06 DIAGNOSIS — I739 Peripheral vascular disease, unspecified: Secondary | ICD-10-CM

## 2018-07-06 DIAGNOSIS — R079 Chest pain, unspecified: Secondary | ICD-10-CM

## 2018-07-06 DIAGNOSIS — I50813 Acute on chronic right heart failure: Secondary | ICD-10-CM | POA: Diagnosis not present

## 2018-07-06 DIAGNOSIS — I5043 Acute on chronic combined systolic (congestive) and diastolic (congestive) heart failure: Secondary | ICD-10-CM | POA: Diagnosis not present

## 2018-07-06 DIAGNOSIS — I481 Persistent atrial fibrillation: Secondary | ICD-10-CM | POA: Diagnosis not present

## 2018-07-06 DIAGNOSIS — Z86711 Personal history of pulmonary embolism: Secondary | ICD-10-CM

## 2018-07-06 DIAGNOSIS — N183 Chronic kidney disease, stage 3 unspecified: Secondary | ICD-10-CM

## 2018-07-06 DIAGNOSIS — I4819 Other persistent atrial fibrillation: Secondary | ICD-10-CM

## 2018-07-06 DIAGNOSIS — Z950 Presence of cardiac pacemaker: Secondary | ICD-10-CM

## 2018-07-06 LAB — BASIC METABOLIC PANEL
Anion gap: 9 (ref 5–15)
BUN: 28 mg/dL — ABNORMAL HIGH (ref 8–23)
CALCIUM: 8.5 mg/dL — AB (ref 8.9–10.3)
CO2: 26 mmol/L (ref 22–32)
CREATININE: 1.54 mg/dL — AB (ref 0.44–1.00)
Chloride: 104 mmol/L (ref 98–111)
GFR calc Af Amer: 35 mL/min — ABNORMAL LOW (ref >60)
GFR, EST NON AFRICAN AMERICAN: 30 mL/min — AB (ref >60)
Glucose, Bld: 99 mg/dL (ref 70–99)
Potassium: 3.5 mmol/L (ref 3.5–5.1)
SODIUM: 139 mmol/L (ref 135–145)

## 2018-07-06 NOTE — Patient Instructions (Signed)
Your physician recommends that you schedule a follow-up appointment in: TO BE DETERMINED WITH DR [facility]  Your physician recommends that you continue on your current medications as directed. Please refer to the Current Medication list given to you today.  PLEASE TAKE EXTRA DOSE OF TORSEMIDE 2PM DAILY   Your physician recommends that you return for lab work BMP - WE HAVE GIVEN YOU ORDERS  You have been referred to VEIN AND VASCULAR  You have been referred to PULMONARY  You have been referred to HEART FAILURE CLINIC   Your physician has requested that you have a lexiscan myoview. For further information please visit [email or web address] Please follow instruction sheet, as given.  Thank you for choosing [facility]!!
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
## 2018-07-07 DIAGNOSIS — I5033 Acute on chronic diastolic (congestive) heart failure: Secondary | ICD-10-CM | POA: Diagnosis not present

## 2018-07-07 DIAGNOSIS — J439 Emphysema, unspecified: Secondary | ICD-10-CM | POA: Diagnosis not present

## 2018-07-07 DIAGNOSIS — I481 Persistent atrial fibrillation: Secondary | ICD-10-CM | POA: Diagnosis not present

## 2018-07-07 DIAGNOSIS — Z48812 Encounter for surgical aftercare following surgery on the circulatory system: Secondary | ICD-10-CM | POA: Diagnosis not present

## 2018-07-07 DIAGNOSIS — F419 Anxiety disorder, unspecified: Secondary | ICD-10-CM | POA: Diagnosis not present

## 2018-07-07 DIAGNOSIS — I130 Hypertensive heart and chronic kidney disease with heart failure and stage 1 through stage 4 chronic kidney disease, or unspecified chronic kidney disease: Secondary | ICD-10-CM | POA: Diagnosis not present

## 2018-07-07 DIAGNOSIS — N183 Chronic kidney disease, stage 3 (moderate): Secondary | ICD-10-CM | POA: Diagnosis not present

## 2018-07-07 DIAGNOSIS — G40909 Epilepsy, unspecified, not intractable, without status epilepticus: Secondary | ICD-10-CM | POA: Diagnosis not present

## 2018-07-07 DIAGNOSIS — F329 Major depressive disorder, single episode, unspecified: Secondary | ICD-10-CM | POA: Diagnosis not present

## 2018-07-11 ENCOUNTER — Encounter: Payer: Self-pay | Admitting: Internal Medicine

## 2018-07-11 NOTE — Progress Notes (Signed)
Patient Care Team: Aliene Beams, MD as PCP - General (Family Medicine) Laqueta Linden, MD as PCP - Cardiology (Cardiology) Juanell Fairly, RN as Registered Nurse   HPI  Karen Richardson is a 84 y.o. female in followup for atrial fibrillation-persistent with RVR for which she failed Tikosyn initiation and amiodarone.  She is known to have left atrial enlargement; developed severe LV dysfunction with a rapid rates.  She underwent pacing and subsequent AV junction ablation.  She was hospitalized a week after with acute on chronic heart failure.  She was diuresed.  Repeat echo had demonstrated interval improvement to the 40-45% range  DATE TEST EF   1/18 Echo   60-65 %   3/18 Echo   55-65 % Mod TR  6/19   Echo  30-35%  Mod sev TR  7/19 Echo  40-45%    There is been a great deal of frustration with her lack of progress.  Multiple conversations with her daughter and her husband.  Delving into her history, she has had problems with severe dyspnea on and off over the last 2+ years.  There have been numerous occasions apparently when she has been so short of breath she has had to lie over the hood of the car.  Reviewing CT scans, she has been identified as having emphysema.    She saw Sko 8/19;  Referred to CHF clinic pulmonary and vascular and ordered myoview She is extremely short of breath with minimal exertion.  She also complains of feeling cold a lot.  TSH 6/19 was 5.40  Past Medical History:  Diagnosis Date  . Acute on chronic diastolic (congestive) heart failure (HCC)   . Anxiety   . Atrial fibrillation with RVR (HCC) 12/01/2016  . CKD (chronic kidney disease), stage III (HCC) 12/12/2016  . Depression   . Dysphagia   . GERD (gastroesophageal reflux disease) 12/01/2016  . Grand mal seizure [facility])    "none since ~ 2005; on daily RX" (06/08/2018)  . Heart murmur   . History of esophageal stricture 12/10/2016  . Hyperlipidemia   . Hypertension    x 4 months  . Migraine      "bad one til I reached age [age]; none since" (06/08/2018)  . PONV (postoperative nausea and vomiting)   . Presbyesophagus 12/11/2016  . Presence of permanent cardiac pacemaker   . Pulmonary emboli (HCC) 12/01/2016  . Pulmonary emphysema (HCC) 09/06/2017  . Pulmonary nodule   . Stroke [facility]) ~ 2018   "in my left eye"     Past Surgical History:  Procedure Laterality Date  . ABDOMINAL HYSTERECTOMY     "partial"  . AUGMENTATION MAMMAPLASTY Bilateral   . AV NODE ABLATION N/A 06/03/2018   Procedure: AV NODE ABLATION;  Surgeon: Duke Salvia, MD;  Location: [facility] INVASIVE CV LAB;  Service: Cardiovascular;  Laterality: N/A;  . CARDIOVERSION N/A 05/03/2017   Procedure: CARDIOVERSION;  Surgeon: Elease Hashimoto Deloris Ping, MD;  Location: [facility] ENDOSCOPY;  Service: Cardiovascular;  Laterality: N/A;  . CATARACT EXTRACTION W/ INTRAOCULAR LENS IMPLANT Right 2019  . ESOPHAGEAL DILATION N/A 03/17/2017   Procedure: ESOPHAGEAL DILATION;  Surgeon: Malissa Hippo, MD;  Location[facility] ENDO SUITE;  Service: Endoscopy;  Laterality: N/A;  . ESOPHAGOGASTRODUODENOSCOPY N/A 03/17/2017   Procedure: ESOPHAGOGASTRODUODENOSCOPY (EGD);  Surgeon: Malissa Hippo, MD;  Location: [facility] ENDO SUITE;  Service: Endoscopy;  Laterality: N/A;  10:30  . PACEMAKER IMPLANT N/A 05/09/2018   Procedure: PACEMAKER IMPLANT;  Surgeon: Graciela Husbands,  Salvatore Decent, MD;  Location: [facility] INVASIVE CV LAB;  Service: Cardiovascular;  Laterality: N/A;  . TEE WITHOUT CARDIOVERSION N/A 05/06/2018   Procedure: TRANSESOPHAGEAL ECHOCARDIOGRAM (TEE);  Surgeon: Pricilla Riffle, MD;  Location: [facility] ENDOSCOPY;  Service: Cardiovascular;  Laterality: N/A;  . TUMOR EXCISION     from spinal cord ; benign    Current Meds  Medication Sig  . atorvastatin (LIPITOR) 10 MG tablet TAKE ONE TABLET BY MOUTH EVERY EVENING.  . carbamazepine (TEGRETOL XR) 100 MG 12 hr tablet Take 1 tablet by mouth twice daily (morning, bedtime)  . carvedilol (COREG) 6.25 MG tablet Take 1 tablet (6.25 mg total) by mouth 2  (two) times daily with a meal.  . mirtazapine (REMERON) 15 MG tablet Take 1 tablet (15 mg total) by mouth at bedtime.  . pantoprazole (PROTONIX) 40 MG tablet Take 40 mg by mouth daily.  . potassium chloride (K-DUR,KLOR-CON) 10 MEQ tablet Take 2 tablets (20 mEq total) by mouth 2 (two) times daily.  Marland Kitchen torsemide (DEMADEX) 20 MG tablet Take 2 tablets (40 mg total) by mouth daily.  Marland Kitchen torsemide (DEMADEX) 20 MG tablet TAKE TWO (2) TABLETS BY MOUTH TWICE DAILY. TAKE ONE TABLET ADDITIONAL AS NEEDED FOR SWELLING  . Vitamin D, Ergocalciferol, (DRISDOL) 50000 units CAPS capsule TAKE ONE CAPSULE BY MOUTH EVERY 7 DAYS (FRIDAY MORNING)  . warfarin (COUMADIN) 2.5 MG tablet TAKE AS DIRECTED BY THE ANTICOAGULATION CLINIC (Patient taking differently: Take 3.75 mg by mouth daily on Monday, Wednesday, Friday and Saturday. Take 2.5 mg by mouth daily on all other days)    Allergies  Allergen Reactions  . Codeine Nausea Only  . Amiodarone Other (See Comments)    Neuropathy and skin discoloration       Review of Systems negative except from HPI and PMH  Physical Exam BP 102/70   Pulse 67   Ht 5\' 5"  (1.651 m)   Wt 160 lb (72.6 kg)   SpO2 95%   BMI 26.63 kg/m  Well developed and nourished in no acute distress HENT normal Neck supple with JVP 8-10 prominent V waves and positive HJR t Clear Regular rate and rhythm, 2/6 systolic murmur  abd-soft with active BS No Clubbing cyanosis edema Skin-warm and dry A & Oriented  Grossly normal sensory and motor function   Assessment and  Plan  Atrial fib with RVR  Cardiomyopathy presumably nonischemic interval improvement  CHF chronic  systolic   AV junction ablation  Dyspnea on exertion  Right-sided heart failure severe TR  COPD/emphysema    Patient continues to struggle with dyspnea.  She has severe TR   hypertension.  With modest right ventricular enlargement and dysfunction.  Pulmonary hypertension is likely underestimated because of the degree  of TR. She is to see pulmonary as well as heart failure.  I think these will be very illuminating.  Right now her rhythm issues are stable.  Ambulation in office  HR well controlled and O2 SAt > 95   We spent more than 50% of our >25 min visit in face to face counseling regarding the above  Current medicines are reviewed at length with the patient today .  The patient does not  have concerns regarding medicines.

## 2018-07-12 ENCOUNTER — Other Ambulatory Visit: Payer: Self-pay | Admitting: Cardiovascular Disease

## 2018-07-12 ENCOUNTER — Other Ambulatory Visit: Payer: Self-pay

## 2018-07-12 ENCOUNTER — Encounter: Payer: Self-pay | Admitting: Internal Medicine

## 2018-07-12 ENCOUNTER — Ambulatory Visit (INDEPENDENT_AMBULATORY_CARE_PROVIDER_SITE_OTHER): Payer: Medicare HMO

## 2018-07-12 ENCOUNTER — Telehealth: Payer: Self-pay | Admitting: *Deleted

## 2018-07-12 ENCOUNTER — Ambulatory Visit (INDEPENDENT_AMBULATORY_CARE_PROVIDER_SITE_OTHER): Payer: Medicare HMO | Admitting: Internal Medicine

## 2018-07-12 VITALS — BP 102/70 | HR 67 | Ht 65.0 in | Wt 160.0 lb

## 2018-07-12 DIAGNOSIS — Z5181 Encounter for therapeutic drug level monitoring: Secondary | ICD-10-CM

## 2018-07-12 DIAGNOSIS — I4891 Unspecified atrial fibrillation: Secondary | ICD-10-CM | POA: Diagnosis not present

## 2018-07-12 DIAGNOSIS — I5043 Acute on chronic combined systolic (congestive) and diastolic (congestive) heart failure: Secondary | ICD-10-CM

## 2018-07-12 DIAGNOSIS — I482 Chronic atrial fibrillation, unspecified: Secondary | ICD-10-CM

## 2018-07-12 DIAGNOSIS — Z7901 Long term (current) use of anticoagulants: Secondary | ICD-10-CM | POA: Diagnosis not present

## 2018-07-12 DIAGNOSIS — I4819 Other persistent atrial fibrillation: Secondary | ICD-10-CM

## 2018-07-12 DIAGNOSIS — I5033 Acute on chronic diastolic (congestive) heart failure: Secondary | ICD-10-CM

## 2018-07-12 DIAGNOSIS — I10 Essential (primary) hypertension: Secondary | ICD-10-CM

## 2018-07-12 DIAGNOSIS — I739 Peripheral vascular disease, unspecified: Secondary | ICD-10-CM

## 2018-07-12 DIAGNOSIS — Z950 Presence of cardiac pacemaker: Secondary | ICD-10-CM | POA: Diagnosis not present

## 2018-07-12 DIAGNOSIS — I481 Persistent atrial fibrillation: Secondary | ICD-10-CM | POA: Diagnosis not present

## 2018-07-12 LAB — POCT INR: INR: 2.2 (ref 2.0–3.0)

## 2018-07-12 MED ORDER — POTASSIUM CHLORIDE CRYS ER 10 MEQ PO TBCR: 20.0000 meq | EXTENDED_RELEASE_TABLET | Freq: Two times a day (BID) | ORAL | 6 refills | Status: AC

## 2018-07-12 NOTE — Telephone Encounter (Signed)
Notes recorded by Lesle Chris, LPN on 1/47/8295 at 9:44 AM EDT Husband Greggory Stallion) notified. Will mail lab order for her to do at [facility]. Will send new prescription to [facility] - bubble packed. ------  Notes recorded by Laqueta Linden, MD on 07/06/2018 at 12:33 PM EDT Renal function has improved since 7/11. Appears close to baseline. After she takes torsemide 20 mg every afternoon at 2 pm for at least 5 days, BMET will need to be repeated. Have her take KCl 40 meq daily. K is low normal.

## 2018-07-12 NOTE — Patient Instructions (Signed)
Medication Instructions:  Your physician recommends that you continue on your current medications as directed. Please refer to the Current Medication list given to you today.  Labwork: None ordered.  Testing/Procedures: None ordered.  Follow-Up: Your physician recommends that you schedule a follow-up appointment in:   One Year with Dr Graciela Husbands Six months with Dr Purvis Sheffield  Please keep your follow up appointments with pulmonary, Heart Failure Clinic, and Vascular physicians.  Any Other Special Instructions Will Be Listed Below (If Applicable).     If you need a refill on your cardiac medications before your next appointment, please call your pharmacy.

## 2018-07-12 NOTE — Patient Instructions (Signed)
Please continue 1.5 tablets daily except 1 tablet on Tuesdays, Thursdays and Sundays Recheck in 3 weeks.

## 2018-07-12 NOTE — Patient Outreach (Signed)
[facility]) Care Management  07/12/2018  Karen Richardson 10/26/1934 [id number]   Karen Richardson was not available for routine home visit due to completing follow up labs and testing.    PLAN RN CM will continue routine outreach.   Karen Richardson [facility] Care Manager [phone]

## 2018-07-13 ENCOUNTER — Telehealth: Payer: Self-pay | Admitting: Cardiovascular Disease

## 2018-07-13 ENCOUNTER — Ambulatory Visit: Payer: Medicare HMO | Admitting: Family Medicine

## 2018-07-13 ENCOUNTER — Telehealth: Payer: Self-pay | Admitting: Internal Medicine

## 2018-07-13 MED ORDER — TORSEMIDE 20 MG PO TABS: 40.0000 mg | ORAL_TABLET | ORAL | 1 refills | Status: DC

## 2018-07-13 NOTE — Telephone Encounter (Signed)
Clarified potassium dosage with [facility] as per LOV with Dr Graciela Husbands and Dr Purvis Sheffield

## 2018-07-13 NOTE — Telephone Encounter (Signed)
Pt daughter Graciella Belton) calling upset about torsemide being on pt medication list on MyChart 2 times with 2 different directions. Per LOV with Dr Purvis Sheffield pt should be on torsemide 40 mg in the morning and additional 20 mg at 2pm - potassium per recent lab results should be 40 meq daily - until next lab results are done - will update medication list - spoke with Myra at [facility] to also verify dosage of potassium and torsemide - daughter voiced understanding

## 2018-07-13 NOTE — Telephone Encounter (Signed)
Pt's daughter called to discuss OV from 8/14 with Dr Graciela Husbands. We discussed the indication for pt's pacemaker and av node ablation. We discussed upcoming appointments. I will clarify with Dr Graciela Husbands about the 91 day post implant and call [name] back.

## 2018-07-13 NOTE — Telephone Encounter (Signed)
LM to return call.

## 2018-07-13 NOTE — Telephone Encounter (Signed)
[facility] called. They need clarification on patient's potassium prescription . Please ask for Myra . [phone].

## 2018-07-13 NOTE — Telephone Encounter (Signed)
Dot Been called requesting to speak with someone in regards to patient's upcoming test to be done tomorrow at [facility].  [phone] ([name])

## 2018-07-13 NOTE — Telephone Encounter (Signed)
New message:      Pt's daughter is calling and has some f/u questions about her mother's appt on yesterday.

## 2018-07-14 ENCOUNTER — Encounter (HOSPITAL_BASED_OUTPATIENT_CLINIC_OR_DEPARTMENT_OTHER)
Admission: RE | Admit: 2018-07-14 | Discharge: 2018-07-14 | Disposition: A | Discharge status: Home / Self Care | Payer: Medicare HMO | Source: Ambulatory Visit | Attending: Cardiovascular Disease | Admitting: Cardiovascular Disease

## 2018-07-14 ENCOUNTER — Telehealth: Payer: Self-pay | Admitting: *Deleted

## 2018-07-14 ENCOUNTER — Encounter (HOSPITAL_COMMUNITY)
Admission: RE | Admit: 2018-07-14 | Discharge: 2018-07-14 | Disposition: A | Discharge status: Home / Self Care | Payer: Medicare HMO | Source: Ambulatory Visit | Attending: Cardiovascular Disease | Admitting: Cardiovascular Disease

## 2018-07-14 DIAGNOSIS — R079 Chest pain, unspecified: Secondary | ICD-10-CM | POA: Diagnosis not present

## 2018-07-14 LAB — NM MYOCAR MULTI W/SPECT W/WALL MOTION / EF
CHL CUP NUCLEAR SSS: 6
CSEPPHR: 70 {beats}/min
LVDIAVOL: 76 mL (ref 46–106)
LVSYSVOL: 33 mL
RATE: 0.66
Rest HR: 70 {beats}/min
SDS: 3
SRS: 3
TID: 1.05

## 2018-07-14 MED ORDER — TECHNETIUM TC 99M TETROFOSMIN IV KIT
30.0000 | PACK | Freq: Once | INTRAVENOUS | Status: AC | PRN
  Administered 2018-07-14: 30.4 via INTRAVENOUS

## 2018-07-14 MED ORDER — REGADENOSON 0.4 MG/5ML IV SOLN
INTRAVENOUS | Status: AC
  Administered 2018-07-14: 0.4 mg via INTRAVENOUS
  Filled 2018-07-14: qty 5

## 2018-07-14 MED ORDER — SODIUM CHLORIDE 0.9% FLUSH
INTRAVENOUS | Status: AC
  Administered 2018-07-14: 10 mL via INTRAVENOUS
  Filled 2018-07-14: qty 10

## 2018-07-14 MED ORDER — TECHNETIUM TC 99M TETROFOSMIN IV KIT
10.0000 | PACK | Freq: Once | INTRAVENOUS | Status: AC | PRN
  Administered 2018-07-14: 10.11 via INTRAVENOUS

## 2018-07-14 NOTE — Telephone Encounter (Signed)
Notes recorded by Lesle Chris, LPN on 7/48/2707 at 5:58 PM EDT Patient notified. Copy to pmd. ------  Notes recorded by Laqueta Linden, MD on 07/14/2018 at 3:04 PM EDT Low risk for blockages.

## 2018-07-15 ENCOUNTER — Telehealth: Payer: Self-pay | Admitting: Cardiovascular Disease

## 2018-07-15 NOTE — Telephone Encounter (Signed)
Received telephone call from [name].She states that Karen Richardson is having issues with her eyes itching, sores on her body, dry mouth. Wanting to know if patient could be having an allergic reaction to Torsemide.  She also has questions about the potassium dosage. States that she has not started extra dosage of Potassium.  Patient has not had BMET drawn yet.

## 2018-07-15 NOTE — Telephone Encounter (Signed)
Daughter states patient has "sores" all over her back and face that are very itchy. Patient is having itchy, watery eyes. Daughter seems to think this is coming from the increase in torsemide and that patient might be allergic to medication. Daughter states this has been going on for a few days but keeps getting worse. Patient is not established with a PCP at this time. Patient does not complain of any new symptoms. Shortness of breath still the same.

## 2018-07-15 NOTE — Telephone Encounter (Signed)
Patient notified and verbalized understanding. 

## 2018-07-15 NOTE — Telephone Encounter (Signed)
If she had an allergy then it should have developed within the first few days of taking torsemide for the first time, not because of an increase in the dose. She needs to establish with a PCP.

## 2018-07-19 ENCOUNTER — Other Ambulatory Visit: Payer: Self-pay | Admitting: Family Medicine

## 2018-07-19 ENCOUNTER — Other Ambulatory Visit: Payer: Self-pay | Admitting: Cardiovascular Disease

## 2018-07-20 ENCOUNTER — Ambulatory Visit (INDEPENDENT_AMBULATORY_CARE_PROVIDER_SITE_OTHER): Payer: Medicare HMO | Admitting: Nurse Practitioner

## 2018-07-20 ENCOUNTER — Telehealth: Payer: Self-pay | Admitting: Nurse Practitioner

## 2018-07-20 ENCOUNTER — Telehealth: Payer: Self-pay

## 2018-07-20 ENCOUNTER — Encounter: Payer: Self-pay | Admitting: Nurse Practitioner

## 2018-07-20 VITALS — BP 132/68 | HR 71 | Ht 65.0 in | Wt 164.6 lb

## 2018-07-20 DIAGNOSIS — J984 Other disorders of lung: Secondary | ICD-10-CM

## 2018-07-20 DIAGNOSIS — R21 Rash and other nonspecific skin eruption: Secondary | ICD-10-CM

## 2018-07-20 DIAGNOSIS — I2699 Other pulmonary embolism without acute cor pulmonale: Secondary | ICD-10-CM

## 2018-07-20 NOTE — Assessment & Plan Note (Signed)
Most likely due to age and previous second hand smoke exposure.  PFT scheduled for next week.

## 2018-07-20 NOTE — Telephone Encounter (Signed)
Daughter [name] returned phone call;  Contact # [phone]

## 2018-07-20 NOTE — Telephone Encounter (Signed)
Dr Wynona Neat does not have any openings until 2 weeks out. Archie Patten is gone for the day. Will route to Tonya to see if okay for patient to wait 2 weeks for follow up w/ PFT.  Please advise, thank you.

## 2018-07-20 NOTE — Telephone Encounter (Signed)
Called [name] but there was no answer. LM to call back. I will route to [facility] for FYI since she has an appt with her today.

## 2018-07-20 NOTE — Progress Notes (Signed)
@Patient  ID: Karen Richardson, female    DOB: Jun 10, 1934, 85 y.o.   MRN: [id number]  Chief Complaint  Patient presents with  . Follow-up    Feeling short of breath, has to sleep in fetal position at night.     Referring provider: Aliene Beams, MD  HPI  84 year old female never smoker, with restrictive lung disease, lung nodule, pulmonary embolism, and pulmonary hypertension. Patient had been followed by Dr. Celene Skeen.  Health history includes A-fib, CHF, GERD, Seizure disorder, and CKD.   Tests:  PFT's 08/05/17: FVC 2.42 L (90%) FEV1 1.88 L (94%) FEV1/FVC 0.78 FEF 25-75 1.68 L (120%)                                                                                                                       DLCO corrected 48% 04/28/17: FVC 2.30 L (85%) FEV1 1.81 L (90%) FEV1/FVC 0.79 FEF 25-75 1.73 L (122%) negative bronchodilator response TLC 3.1 L (73%) RV 60% ERV 60% DLCO uncorrected 49%  09/06/17:  Walked 264 meters / Baseline Sat 100% on RA / Nadir Sat 97% on RA  IMAGING:  Chest xray 06-06-18: Satisfactory pacemaker appearance. No pneumothorax, consolidation, or edema. Cardiomegaly.   CT chest 05-17-18: IMPRESSION: 1.  Negative for acute pulmonary embolus. 2. Severe cardiomegaly with reflux of right atrium vascular contrast into the IVC and dilated hepatic veins suggesting a degree of right heart failure. 3. Small layering pleural effusions greater on the right. No other acute pulmonary process. 4. Mild postoperative changes about the left chest cardiac pacemaker.  CT CHEST W/O 07/07/17:  Cardiomegaly appreciated as well as dilation of ascending aorta. Very minimal apical predominant emphysema. Minimal amount of pleural thickening primarily on the left. Bilateral breast prosthesis noted. No pathologic mediastinal or hilar adenopathy. No pleural effusion. No pericardial effusion. No parenchymal nodule or mass appreciated.  CXR PA/LAT 02/19/17:  Approximately 2 cm nodule present  on lateral view. Cardiomegaly without pleural effusion noted. Some flattening of the diaphragms suggestive of hyperinflation. Mediastinum normal in contour.  CARDIAC TTE (02/22/17):  LV normal in size with mild LVH. EF 55-60%. Images inadequate to assess diastolic function or wall motion. LA & RA severely dilated. RV mild to moderately dilated with mildly reduced systolic function. Mild aortic regurgitation without stenosis. Aortic root normal in size. Mild mitral regurgitation without stenosis. No significant pulmonic regurgitation or stenosis with poorly visualized. Moderate tricuspid regurgitation. No pericardial effusion.  OV 07-20-18 - Routine follow up - restrictive lung disease, lung nodule, pulmonary embolism, and pulmonary hypertension. Patient has history of CHF and has recently been followed closely by cardiology. States that she is short of breath and has a hard time sleeping at night due to being more short of breath when laying flat. She has been compliant with medications. Has only been taking Demadex in the am, but states that she was told to take the medication this way per her cardiology.   Also states that she has a  rash today that has been bothering her for the past few days. The rash is on her back. She denies any changes in soaps or detergents or any exposure to poison oak/ivy.    She denies any chest pain. Denies any fever. Denies any lower extremity edema.   Restrictive lung disease: Most likely secondary to cardiomegaly. Previous CT showed no suggestion of interstitial lung disease. She does complain of shortness of breath, but denies any wheezing.   Lung nodule: Noted on x ray in March 2018, but was not seen on CT in August 2018.   Pulmonary embolism: Resolved on CT June 2019. Patient is on coumadin for a-fib.   Pulmonary hypertension: Multifactorial & likely combination of group 2 & 3. Denies any lower extremity edema. No near syncope or syncope. She is now only taking  Demadex 40 mg in the a.m. once daily.    Consistent with last OV with Dr. Celene Skeen 09-06-17: Restrictive lung disease: Likely secondary to cardiomegaly.  No suggestion of interstitial lung disease on chest CT imaging. She denies any coughing or wheezing.   Lung nodule: First seen on x-ray imaging in March 2018. This was not visualized on CT imaging performed in August.  Pulmonary embolism: Diagnosed earlier this year at outside hospital. Previously on systemic anticoagulation. Referred to hematology at last appointment. She is on Coumadin chronically for her atrial fibrillation. She isn't sure she saw the hematologist and thinks she missed her appointment.   Pulmonary hypertension: Multifactorial & likely combination of group 2 & 3. Denies any lower extremity edema. No near syncope or syncope. She is now only taking Lasix once daily.     Allergies  Allergen Reactions  . Codeine Nausea Only  . Amiodarone Other (See Comments)    Neuropathy and skin discoloration   . Mirtazapine     hallucinations    Immunization History  Administered Date(s) Administered  . DTaP 09/07/2017  . Influenza Whole 09/13/2016  . Influenza-Unspecified 09/07/2017  . Pneumococcal-Unspecified 07/02/2012    Past Medical History:  Diagnosis Date  . Acute on chronic diastolic (congestive) heart failure (HCC)   . Acute on chronic diastolic CHF (congestive heart failure) (HCC) 05/17/2018  . Acute on chronic diastolic heart failure (HCC) 06/06/2018  . Anxiety   . Atrial fibrillation with RVR (HCC) 12/01/2016  . Chronic atrial fibrillation (HCC)    CHA2DS2VASC score 5  Amiodarone stopped due to neuropathy and skin discoloration 2/19 Tikosyn QT prolongation Decision made to leave in a fib and possibly consider AVN ablation  . CKD (chronic kidney disease), stage III (HCC) 12/12/2016  . Depression   . Dysphagia   . GERD (gastroesophageal reflux disease) 12/01/2016  . Grand mal seizure [facility])    "none since ~ 2005; on  daily RX" (06/08/2018)  . Heart murmur   . History of esophageal stricture 12/10/2016  . HLD (hyperlipidemia) 06/09/2018  . Hyperlipidemia   . Hypertension    x 4 months  . Migraine    "bad one til I reached age [age]; none since" (06/08/2018)  . Monitoring for long-term anticoagulant use 12/18/2016  . Other secondary pulmonary hypertension (HCC) 07/02/2017  . Persistent atrial fibrillation (HCC) 05/19/2018  . PONV (postoperative nausea and vomiting)   . Presbyesophagus 12/11/2016  . Presence of permanent cardiac pacemaker   . Pulmonary emboli (HCC) 12/01/2016  . Pulmonary emphysema (HCC) 09/06/2017  . Pulmonary nodule   . S/P placement of cardiac pacemaker 06/09/2018  . Seizure disorder (HCC) 12/12/2016  . Stroke (HCC) ~  2018   "in my left eye"     Tobacco History: Social History   Tobacco Use  Smoking Status Passive Smoke Exposure - Never Smoker  Smokeless Tobacco Never Used  Tobacco Comment   Father as a child    Counseling given: Yes Comment: Father as a child    Outpatient Encounter Medications as of 07/20/2018  Medication Sig  . atorvastatin (LIPITOR) 10 MG tablet TAKE ONE TABLET BY MOUTH DAILY (BEDTIME)  . carbamazepine (TEGRETOL XR) 100 MG 12 hr tablet Take 1 tablet by mouth twice daily (morning, bedtime)  . carvedilol (COREG) 6.25 MG tablet Take 1 tablet (6.25 mg total) by mouth 2 (two) times daily with a meal.  . pantoprazole (PROTONIX) 40 MG tablet Take 40 mg by mouth daily.  . potassium chloride (K-DUR,KLOR-CON) 10 MEQ tablet Take 2 tablets (20 mEq total) by mouth 2 (two) times daily.  . RHOPRESSA 0.02 % SOLN Apply 1 drop to eye at bedtime.  . torsemide (DEMADEX) 20 MG tablet Take 2 tablets (40 mg total) by mouth every morning. TAKE ADDITIONAL TABLET 20 MG AT 2PM  . Vitamin D, Ergocalciferol, (DRISDOL) 50000 units CAPS capsule TAKE ONE CAPSULE BY MOUTH EVERY 7 DAYS (FRIDAY MORNING)  . warfarin (COUMADIN) 2.5 MG tablet TAKE AS DIRECTED BY THE ANTICOAGULATION CLINIC  (Patient taking differently: Take 3.75 mg by mouth daily on Monday, Wednesday, Friday and Saturday. Take 2.5 mg by mouth daily on all other days)  . pantoprazole (PROTONIX) 40 MG tablet TAKE ONE TABLET BY MOUTH DAILY (MORNING) (Patient not taking: Reported on 07/20/2018)  . [DISCONTINUED] mirtazapine (REMERON) 15 MG tablet Take 1 tablet (15 mg total) by mouth at bedtime. (Patient not taking: Reported on 07/20/2018)   No facility-administered encounter medications on file as of 07/20/2018.      Review of Systems  Review of Systems  Constitutional: Negative.   HENT: Negative.   Respiratory: Negative for cough and shortness of breath.   Cardiovascular: Negative.   Gastrointestinal: Negative.   Allergic/Immunologic: Negative.   Neurological: Negative.   Psychiatric/Behavioral: Negative.        Physical Exam  BP 132/68 (BP Location: Right Arm, Patient Position: Sitting, Cuff Size: Normal)   Pulse 71   Ht 5\' 5"  (1.651 m)   Wt 164 lb 9.6 oz (74.7 kg)   SpO2 95%   BMI 27.39 kg/m   Wt Readings from Last 5 Encounters:  07/20/18 164 lb 9.6 oz (74.7 kg)  07/12/18 160 lb (72.6 kg)  07/06/18 165 lb (74.8 kg)  06/22/18 162 lb (73.5 kg)  06/14/18 164 lb 12.8 oz (74.8 kg)     Physical Exam  Constitutional: She is oriented to person, place, and time. She appears well-developed and well-nourished. No distress.  Cardiovascular: Normal rate and regular rhythm.  Pulmonary/Chest: Effort normal and breath sounds normal.  Neurological: She is alert and oriented to person, place, and time.  Psychiatric: She has a normal mood and affect.  Nursing note and vitals reviewed.     Imaging: Nm Myocar Multi W/spect W/wall Motion / Ef  Result Date: 07/14/2018  The study is normal. No myocardial ischemia or scar.  This is a low risk study.  Nuclear stress EF: 57%.      Assessment & Plan:  Restrictive lung disease  Pulmonary embolism without acute cor pulmonale, unspecified chronicity,  unspecified pulmonary embolism type (HCC)  Rash  Patient Instructions  Will check PFT and follow up in 1 week with Dr. Wynona Neat. Continue to take medications  as directed May use hydrocortisone cream for itching May try melatonin for sleep Please sleep propped up by pillows Please call if symptoms worsen Please keep follow up appointments with Cardiology and Vascular      Restrictive lung disease Will order PFT next week Previous Doctor is no longer here. Will schedule follow up after PFT with Dr. Wynona Neat.   Pulmonary emboli (HCC) Resolved on most recent CT Continue coumadin for A-FIB  Pulmonary emphysema (HCC) Most likely due to age and previous second hand smoke exposure.  PFT scheduled for next week.      Ivonne Andrew, NP 07/20/2018

## 2018-07-20 NOTE — Telephone Encounter (Signed)
It sounds like this could be a reaction to the new medication. Please call cardiology to see if they want to change the medication. If she hasn't taken it his morning please hold it until after speaking with cardiology.

## 2018-07-20 NOTE — Telephone Encounter (Signed)
   [facility] Pre-operative Risk Assessment    Request for surgical clearance:  1. What type of surgery is being performed? Future teeth cleaning  2. When is this surgery scheduled? TBD  3. What type of clearance is required (medical clearance vs. Pharmacy clearance to hold med vs. Both)? Is SBE needed  4. Are there any medications that need to be held prior to surgery and how long?   5. Practice name and name of physician performing surgery?   6. What is your office phone number [phone] Susan   7.   What is your office fax number only email... [email or web address]  8.   Anesthesia type (None, local, MAC, general) ? None

## 2018-07-20 NOTE — Assessment & Plan Note (Signed)
Will order PFT next week Previous Doctor is no longer here. Will schedule follow up after PFT with Dr. Wynona Neat.

## 2018-07-20 NOTE — Patient Instructions (Addendum)
Will check PFT and follow up in 1 week with Dr. Wynona Neat. Continue to take medications as directed May use hydrocortisone cream for itching May try melatonin for sleep Please sleep propped up by pillows Please call if symptoms worsen Please keep follow up appointments with Cardiology and Vascular

## 2018-07-20 NOTE — Assessment & Plan Note (Signed)
Resolved on most recent CT Continue coumadin for A-FIB

## 2018-07-20 NOTE — Telephone Encounter (Signed)
Called patient daughter Sedalia Muta back. She stated she has contacted cardiology who told her that if her mother was having an allergic reaction it would have happened right away. However, the patient was changed from Furosemide to Torsemide about 3 weeks ago. Was instructed by cardiology to stop taking her heart medication, which [name] stated she does not agree with as there are medications you are not to just cut off and stop. Stated no real way to know how long the rash has been going on for, because her mother will let things go for a while before saying anything. Patient daughter Sedalia Muta lives in [place] and keeps up with patient care via MyChart. She stated her mother's breathing issues seem to be worse in the evening and concerned about that also. I told her that based on the response she stated was received by cardiology for her mother to keep the 2:00 appt with NP at Pulmonary this afternoon, so the breathing issue can be addressed and see if anything can be suggested or issued regarding the rash. I advised that her mother may be able to use something topical like Benadryl for the rash, but will call her after the visit to let her know what is going on and what was suggested regarding care.

## 2018-07-21 ENCOUNTER — Telehealth: Payer: Self-pay | Admitting: Pulmonary Disease

## 2018-07-21 DIAGNOSIS — R569 Unspecified convulsions: Secondary | ICD-10-CM | POA: Diagnosis not present

## 2018-07-21 DIAGNOSIS — E559 Vitamin D deficiency, unspecified: Secondary | ICD-10-CM | POA: Diagnosis not present

## 2018-07-21 DIAGNOSIS — Z131 Encounter for screening for diabetes mellitus: Secondary | ICD-10-CM | POA: Diagnosis not present

## 2018-07-21 DIAGNOSIS — R0600 Dyspnea, unspecified: Secondary | ICD-10-CM | POA: Diagnosis not present

## 2018-07-21 DIAGNOSIS — R5383 Other fatigue: Secondary | ICD-10-CM | POA: Diagnosis not present

## 2018-07-21 DIAGNOSIS — Z79899 Other long term (current) drug therapy: Secondary | ICD-10-CM | POA: Diagnosis not present

## 2018-07-21 DIAGNOSIS — E785 Hyperlipidemia, unspecified: Secondary | ICD-10-CM | POA: Diagnosis not present

## 2018-07-21 NOTE — Telephone Encounter (Signed)
Please call to let patient know that this is the quickest that we can get her in. I think her symptoms are more related to her cardiac problems. Please keep all follow up appointments with cardiology. Please go to the ED if extremely short of breath.

## 2018-07-21 NOTE — Telephone Encounter (Signed)
Called and spoke to pt's daughter, Sedalia Muta.  [name] is concerned about PFT being 2 weeks out, as pt is experiencing sob. [name] is requesting additional recommendations.   Tonya please advise. Thanks

## 2018-07-21 NOTE — Telephone Encounter (Signed)
Called patient daughter Sedalia Muta back and advised of information below (from Angus Seller, NP) and that she needs to keep her cardiology appointments since her issues seem more cardiac related. She stated they are tired of being bounced back and forth because cardiology told them the issue is respiratory. I told her I understood her concerns, but based on her visit, I was relaying the information provided by NP. I told her that if we are able to move the appointment up we would, but this is the earliest appt available.

## 2018-07-21 NOTE — Telephone Encounter (Signed)
Spoke with pt's husband. Pt has been scheduled for her PFT on 08/02/18 at 3:330pm, OV has been scheduled for 08/03/18 at 12pm. Nothing further was needed.

## 2018-07-21 NOTE — Telephone Encounter (Signed)
Per Archie Patten NP: Molli Knock for patient to wait until Dr Trena Platt first available the week of Labor Day.  Thank you.

## 2018-07-22 ENCOUNTER — Ambulatory Visit: Payer: Medicare HMO | Admitting: Nurse Practitioner

## 2018-07-22 DIAGNOSIS — Z961 Presence of intraocular lens: Secondary | ICD-10-CM | POA: Diagnosis not present

## 2018-07-22 DIAGNOSIS — H2512 Age-related nuclear cataract, left eye: Secondary | ICD-10-CM | POA: Diagnosis not present

## 2018-07-25 ENCOUNTER — Other Ambulatory Visit: Payer: Self-pay

## 2018-07-25 ENCOUNTER — Institutional Professional Consult (permissible substitution): Payer: Medicare HMO | Admitting: Emergency Medicine

## 2018-07-25 ENCOUNTER — Other Ambulatory Visit: Payer: Self-pay | Admitting: Cardiovascular Disease

## 2018-07-25 ENCOUNTER — Encounter (HOSPITAL_COMMUNITY)
Admission: RE | Admit: 2018-07-25 | Discharge: 2018-07-25 | Disposition: A | Discharge status: Home / Self Care | Payer: Medicare HMO | Source: Ambulatory Visit | Attending: Vascular Surgery | Admitting: Vascular Surgery

## 2018-07-25 ENCOUNTER — Ambulatory Visit (INDEPENDENT_AMBULATORY_CARE_PROVIDER_SITE_OTHER): Payer: Medicare HMO | Admitting: Vascular Surgery

## 2018-07-25 ENCOUNTER — Encounter: Payer: Self-pay | Admitting: Vascular Surgery

## 2018-07-25 VITALS — BP 132/76 | HR 70 | Temp 97.5°F | Resp 22 | Ht 65.0 in | Wt 172.0 lb

## 2018-07-25 DIAGNOSIS — I739 Peripheral vascular disease, unspecified: Secondary | ICD-10-CM

## 2018-07-25 DIAGNOSIS — I70211 Atherosclerosis of native arteries of extremities with intermittent claudication, right leg: Secondary | ICD-10-CM | POA: Diagnosis not present

## 2018-07-25 NOTE — Patient Outreach (Signed)
[facility]) Care Management  07/25/2018  GURJIT TRICHEL [birth year] [id number]   Outreach call placed to Mrs. Ebright. Her primary concern was unrelieved itching related to a rash and bug bites. She stated PCPs office was aware, however itching was not relieved after using topical antihistamine cream. She denied complaints of shortness of breath at the time of the call and reported "breathing pretty good for the last three days." She denied complaints of pain or chest discomfort.  RN CM contacted PCP office. Mrs. Gaugh scheduled for evaluation in PCP office on tomorrow at 0900. Follow up call placed to inform Mrs. Dierolf. She denied further concerns and agreed to contact RN CM with questions as needed.  PLAN RN CM will follow up with Mrs. Albergo later this week and schedule appointment for home visit.   Katha Cabal [facility] Care Manager [phone]
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
## 2018-07-25 NOTE — Telephone Encounter (Signed)
   Primary Cardiologist:Suresh Purvis Sheffield, MD  Chart reviewed as part of pre-operative protocol coverage.   There is no SBE prophylaxis recommended in the setting of pacemakers.    Will route this bundled recommendation to requesting provider via Epic fax function. Please call with questions.  Roe Rutherford Margaretann Abate, PA 07/25/2018, 3:01 PM

## 2018-07-25 NOTE — Telephone Encounter (Signed)
Please print and mail letter to requesting office.

## 2018-07-26 DIAGNOSIS — B9562 Methicillin resistant Staphylococcus aureus infection as the cause of diseases classified elsewhere: Secondary | ICD-10-CM | POA: Diagnosis not present

## 2018-07-26 DIAGNOSIS — R21 Rash and other nonspecific skin eruption: Secondary | ICD-10-CM | POA: Diagnosis not present

## 2018-07-26 NOTE — Telephone Encounter (Signed)
Letter printed, signed and mailed to requesting party

## 2018-08-02 ENCOUNTER — Ambulatory Visit (INDEPENDENT_AMBULATORY_CARE_PROVIDER_SITE_OTHER): Payer: Medicare HMO | Admitting: *Deleted

## 2018-08-02 DIAGNOSIS — I4891 Unspecified atrial fibrillation: Secondary | ICD-10-CM

## 2018-08-02 DIAGNOSIS — Z5181 Encounter for therapeutic drug level monitoring: Secondary | ICD-10-CM

## 2018-08-02 DIAGNOSIS — Z7901 Long term (current) use of anticoagulants: Secondary | ICD-10-CM | POA: Diagnosis not present

## 2018-08-02 LAB — POCT INR: INR: 3.2 — AB (ref 2.0–3.0)

## 2018-08-02 NOTE — Patient Instructions (Signed)
Hold coumadin tonight then resume 1.5 tablets daily except 1 tablet on Tuesdays, Thursdays and Sundays Recheck in 3 weeks

## 2018-08-03 ENCOUNTER — Telehealth: Payer: Self-pay | Admitting: Pulmonary Disease

## 2018-08-03 ENCOUNTER — Ambulatory Visit: Payer: Medicare HMO | Admitting: Pulmonary Disease

## 2018-08-03 DIAGNOSIS — H34812 Central retinal vein occlusion, left eye, with macular edema: Secondary | ICD-10-CM | POA: Diagnosis not present

## 2018-08-03 DIAGNOSIS — H3562 Retinal hemorrhage, left eye: Secondary | ICD-10-CM | POA: Diagnosis not present

## 2018-08-03 DIAGNOSIS — H2513 Age-related nuclear cataract, bilateral: Secondary | ICD-10-CM | POA: Diagnosis not present

## 2018-08-03 DIAGNOSIS — H43811 Vitreous degeneration, right eye: Secondary | ICD-10-CM | POA: Diagnosis not present

## 2018-08-03 NOTE — Telephone Encounter (Signed)
Chart reviewed, with decreased oral intake and significant weight loss I would cut back on her fluid pill. Looks like Dr Kirtland Bouchard started an additional torsemide dose in the afternoon, I would change this to just on days weight is above 158 lbs for now.   Dominga Ferry MD

## 2018-08-03 NOTE — Telephone Encounter (Signed)
I think it may be coincidental  I will suggest to get a chest x-ray--make sure she does not have any haziness  If she is not having any symptoms suggesting a respiratory tract infection  It should not cause a localized infection in the lung unless she gets really sick

## 2018-08-03 NOTE — Telephone Encounter (Signed)
Will respond back to patient's daughter via MyChart.

## 2018-08-03 NOTE — Telephone Encounter (Signed)
-----   Message -----  From: Fransico Michael  Sent: 08/02/2018 11:17 AM EDT  To: Tomma Lightning, MD Subject: Non-Urgent Medical Question  My Mom, Karen Richardson (September 26, 2034) has a pulmonary function test and appointment on Friday.She has a new primary names Dr. Lilly Cove in [place], [phone].He has been treating what appears to be a mrsa skin outbreak with antibiotics.I read that mrsa can effect the lungs such as pleurisy etc.I bring this up because she has had problems breathing and for the past two days she says she can finally breathe better, therefore she is sleeping better.She is finished with the round of antibiotic but the skin sores have not healed all the way.I was wondering if she indeed has had a lung infection too?Just wanted you to be aware - really looking at everything to help her breathe and sleep better and either this is coincidental or a reason that she is breathing better. Thanks, Dot Been  Dr. Wynona Neat, please advise. Thanks!

## 2018-08-04 ENCOUNTER — Telehealth: Payer: Self-pay | Admitting: Cardiovascular Disease

## 2018-08-04 ENCOUNTER — Ambulatory Visit (INDEPENDENT_AMBULATORY_CARE_PROVIDER_SITE_OTHER): Payer: Medicare HMO | Admitting: Internal Medicine

## 2018-08-04 ENCOUNTER — Encounter: Payer: Self-pay | Admitting: Internal Medicine

## 2018-08-04 ENCOUNTER — Other Ambulatory Visit: Payer: Self-pay | Admitting: Pulmonary Disease

## 2018-08-04 VITALS — BP 108/68 | HR 69 | Ht 65.0 in | Wt 155.8 lb

## 2018-08-04 DIAGNOSIS — I739 Peripheral vascular disease, unspecified: Secondary | ICD-10-CM | POA: Diagnosis not present

## 2018-08-04 DIAGNOSIS — I4819 Other persistent atrial fibrillation: Secondary | ICD-10-CM

## 2018-08-04 DIAGNOSIS — Z950 Presence of cardiac pacemaker: Secondary | ICD-10-CM | POA: Diagnosis not present

## 2018-08-04 DIAGNOSIS — J984 Other disorders of lung: Secondary | ICD-10-CM

## 2018-08-04 DIAGNOSIS — I482 Chronic atrial fibrillation, unspecified: Secondary | ICD-10-CM

## 2018-08-04 DIAGNOSIS — I481 Persistent atrial fibrillation: Secondary | ICD-10-CM | POA: Diagnosis not present

## 2018-08-04 DIAGNOSIS — I5043 Acute on chronic combined systolic (congestive) and diastolic (congestive) heart failure: Secondary | ICD-10-CM

## 2018-08-04 NOTE — Progress Notes (Signed)
Patient Care Team: Aliene Beams, MD as PCP - General (Family Medicine) Laqueta Linden, MD as PCP - Cardiology (Cardiology) Juanell Fairly, RN as Registered Nurse   HPI  Karen Richardson is a 84 y.o. female in followup for atrial fibrillation-persistent with RVR for which she failed Tikosyn initiation and amiodarone.  She is known to have left atrial enlargement; developed severe LV dysfunction with a rapid rates.  She underwent pacing and subsequent AV junction ablation.  She was hospitalized a week after with acute on chronic heart failure.  She was diuresed.  Repeat echo had demonstrated interval improvement to the 40-45% range  DATE TEST EF   1/18 Echo   60-65 %   3/18 Echo   55-65 % Mod TR  6/19   Echo  30-35%  Mod sev TR  7/19 Echo  40-45%       She saw Sko 8/19;  Referred to CHF clinic pulmonary and vascular and ordered myoview  Her breathing is much better on the diuretics.  She is no swelling.  She continues to complain of no energy.  She also has pustules on her back.  She was treated with antibiotics by her PCP; no improvement.   TSH 6/19 was 5.40  Past Medical History:  Diagnosis Date  . Acute on chronic diastolic (congestive) heart failure (HCC)   . Acute on chronic diastolic CHF (congestive heart failure) (HCC) 05/17/2018  . Acute on chronic diastolic heart failure (HCC) 06/06/2018  . Anxiety   . Atrial fibrillation with RVR (HCC) 12/01/2016  . Chronic atrial fibrillation (HCC)    CHA2DS2VASC score 5  Amiodarone stopped due to neuropathy and skin discoloration 2/19 Tikosyn QT prolongation Decision made to leave in a fib and possibly consider AVN ablation  . CKD (chronic kidney disease), stage III (HCC) 12/12/2016  . Depression   . Dysphagia   . GERD (gastroesophageal reflux disease) 12/01/2016  . Grand mal seizure [facility])    "none since ~ 2005; on daily RX" (06/08/2018)  . Heart murmur   . History of esophageal stricture 12/10/2016  . HLD (hyperlipidemia)  06/09/2018  . Hyperlipidemia   . Hypertension    x 4 months  . Migraine    "bad one til I reached age 7/53; none since" (06/08/2018)  . Monitoring for long-term anticoagulant use 12/18/2016  . Other secondary pulmonary hypertension (HCC) 07/02/2017  . Persistent atrial fibrillation (HCC) 05/19/2018  . PONV (postoperative nausea and vomiting)   . Presbyesophagus 12/11/2016  . Presence of permanent cardiac pacemaker   . Pulmonary emboli (HCC) 12/01/2016  . Pulmonary emphysema (HCC) 09/06/2017  . Pulmonary nodule   . S/P placement of cardiac pacemaker 06/09/2018  . Seizure disorder (HCC) 12/12/2016  . Stroke [facility]) ~ 2018   "in my left eye"     Past Surgical History:  Procedure Laterality Date  . ABDOMINAL HYSTERECTOMY     "partial"  . AUGMENTATION MAMMAPLASTY Bilateral   . AV NODE ABLATION N/A 06/03/2018   Procedure: AV NODE ABLATION;  Surgeon: Duke Salvia, MD;  Location[facility] INVAS[facility] CV LAB;  Service: Cardiovascular;  Laterality: N/A;  . CARDIOVERSION N/A 05/03/2017   Procedure: CARDIOVERSION;  Surgeon: Elease Hashimoto Deloris Ping, MD;  Location: [facility] ENDOSCOPY;  Service: Cardiovascular;  Laterality: N/A;  . CATARACT EXTRACTION W/ INTRAOCULAR LENS IMPLANT Right 2019  . ESOPHAGEAL DILATION N/A 03/17/2017   Procedure: ESOPHAGEAL DILATION;  Surgeon: Malissa Hippo, MD;  Location[facility] ENDO SUITE;  Service: Endoscopy;  Laterality: N/A;  . ESOPHAGOGASTRODUODENOSCOPY N/A 03/17/2017   Procedure: ESOPHAGOGASTRODUODENOSCOPY (EGD);  Surgeon: Malissa Hippo, MD;  Location: [facility] ENDO SUITE;  Service: Endoscopy;  Laterality: N/A;  10:30  . PACEMAKER IMPLANT N/A 05/09/2018   Procedure: PACEMAKER IMPLANT;  Surgeon: Duke Salvia, MD;  Location: [facility] INVASIVE CV LAB;  Service: Cardiovascular;  Laterality: N/A;  . TEE WITHOUT CARDIOVERSION N/A 05/06/2018   Procedure: TRANSESOPHAGEAL ECHOCARDIOGRAM (TEE);  Surgeon: Pricilla Riffle, MD;  Location[facility] ENDOSCOPY;  Service: Cardiovascular;  Laterality: N/A;  . TUMOR EXCISION      from spinal cord ; benign    Current Meds  Medication Sig  . atorvastatin (LIPITOR) 10 MG tablet TAKE ONE TABLET BY MOUTH DAILY (BEDTIME)  . carbamazepine (TEGRETOL XR) 100 MG 12 hr tablet Take 1 tablet by mouth twice daily (morning, bedtime)  . carvedilol (COREG) 6.25 MG tablet Take 1 tablet (6.25 mg total) by mouth 2 (two) times daily with a meal.  . pantoprazole (PROTONIX) 40 MG tablet Take 40 mg by mouth daily.  . potassium chloride (K-DUR,KLOR-CON) 10 MEQ tablet Take 2 tablets (20 mEq total) by mouth 2 (two) times daily.  . RHOPRESSA 0.02 % SOLN Apply 1 drop to eye at bedtime.  . torsemide (DEMADEX) 20 MG tablet TAKE TWO (2) TABLETS BY MOUTH IN THE MORNING. TAKE ADDITIONAL TABLET AT 2:00 PM  . Vitamin D, Ergocalciferol, (DRISDOL) 50000 units CAPS capsule TAKE ONE CAPSULE BY MOUTH EVERY 7 DAYS (FRIDAY MORNING)  . warfarin (COUMADIN) 2.5 MG tablet TAKE AS DIRECTED BY THE ANTICOAGULATION CLINIC (Patient taking differently: Take 3.75 mg by mouth daily on Monday, Wednesday, Friday and Saturday. Take 2.5 mg by mouth daily on all other days)    Allergies  Allergen Reactions  . Codeine Nausea Only  . Amiodarone Other (See Comments)    Neuropathy and skin discoloration   . Mirtazapine     hallucinations      Review of Systems negative except from HPI and PMH  Physical Exam BP 108/68   Pulse 69   Ht 5\' 5"  (1.651 m)   Wt 155 lb 12.8 oz (70.7 kg)   SpO2 99%   BMI 25.93 kg/m  Well developed and nourished in no acute distress HENT normal Neck supple with JVP <8 Clear Regular rate and rhythm, 2/6 systolic murmur  abd-soft with active BS No Clubbing cyanosis edema Skin-warm and dry eroded pustules on her back A & Oriented  Grossly normal sensory and motor function   Assessment and  Plan  Atrial fib with RVR  Cardiomyopathy presumably nonischemic interval improvement  CHF chronic  systolic   AV junction ablation  Dyspnea on exertion  Right-sided heart failure severe  TR  COPD/emphysema    Stable post AV junction ablation.  Rates well controlled.  Currently euvolemic.  She has significant problems with a rash on her back which is been not responsive to antibiotics.  She has appointment scheduled with pulmonary and CHF in the next 2 weeks for her shortness of breath.  We spent more than 50% of our >25 min visit in face to face counseling regarding the above

## 2018-08-04 NOTE — Telephone Encounter (Signed)
Received telephone call from daughter- Dot Been. She is wanting to talk to nurse about mother's medications.   Please call [phone].

## 2018-08-04 NOTE — Patient Instructions (Signed)
Medication Instructions:  Your physician recommends that you continue on your current medications as directed. Please refer to the Current Medication list given to you today.  Labwork: None ordered.  Testing/Procedures: None ordered.  Follow-Up: Your physician wants you to follow-up in: 9 months with Dr Graciela Husbands. You will receive a reminder letter in the mail two months in advance. If you don't receive a letter, please call our office to schedule the follow-up appointment.   Any Other Special Instructions Will Be Listed Below (If Applicable).     If you need a refill on your cardiac medications before your next appointment, please call your pharmacy.

## 2018-08-04 NOTE — Telephone Encounter (Signed)
Daughter called questioning whether patient should continue on potassium chloride 10 meq taking 2 tablets by mouth twice daily. Daughter advised that these are the instructions given and her last potassium check was normal and that unless a provider gives new instructions for the potassium, that patient should continue the same regimen. Advised daughter that patient is being followed by our clinic closely with an appointment today and again on 08/16/18 and her medications will be reviewed at that time. Daughter verbalized understanding of plan.

## 2018-08-05 ENCOUNTER — Ambulatory Visit (INDEPENDENT_AMBULATORY_CARE_PROVIDER_SITE_OTHER): Payer: Medicare HMO | Admitting: Pulmonary Disease

## 2018-08-05 ENCOUNTER — Encounter: Payer: Self-pay | Admitting: Pulmonary Disease

## 2018-08-05 ENCOUNTER — Ambulatory Visit (INDEPENDENT_AMBULATORY_CARE_PROVIDER_SITE_OTHER)
Admission: RE | Admit: 2018-08-05 | Discharge: 2018-08-05 | Disposition: A | Discharge status: Home / Self Care | Payer: Medicare HMO | Source: Ambulatory Visit | Attending: Pulmonary Disease | Admitting: Pulmonary Disease

## 2018-08-05 VITALS — BP 106/78 | HR 70 | Ht 65.0 in | Wt 155.0 lb

## 2018-08-05 DIAGNOSIS — R0602 Shortness of breath: Secondary | ICD-10-CM | POA: Diagnosis not present

## 2018-08-05 DIAGNOSIS — R400 Somnolence: Secondary | ICD-10-CM

## 2018-08-05 DIAGNOSIS — J984 Other disorders of lung: Secondary | ICD-10-CM | POA: Diagnosis not present

## 2018-08-05 LAB — PULMONARY FUNCTION TEST
DL/VA % pred: 83 %
DL/VA: 4.09 ml/min/mmHg/L
DLCO UNC % PRED: 53 %
DLCO unc: 13.76 ml/min/mmHg
FEF 25-75 Pre: 1.03 L/sec
FEF2575-%Pred-Pre: 76 %
FEV1-%PRED-PRE: 72 %
FEV1-PRE: 1.41 L
FEV1FVC-%PRED-PRE: 102 %
FEV6-%Pred-Pre: 76 %
FEV6-Pre: 1.9 L
FEV6FVC-%Pred-Pre: 106 %
FVC-%Pred-Pre: 71 %
FVC-Pre: 1.9 L
Pre FEV1/FVC ratio: 75 %
Pre FEV6/FVC Ratio: 100 %

## 2018-08-05 NOTE — Progress Notes (Signed)
Spirometry and Dlco done today. 

## 2018-08-05 NOTE — Patient Instructions (Signed)
Shortness of breath  Excessive daytime sleepiness, significant symptoms suggesting presence of sleep disordered breathing   Obtain a home sleep study  Obtain a chest x-ray today  Continue other lines of care  I will see her back in about 3 months
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
## 2018-08-08 DIAGNOSIS — R21 Rash and other nonspecific skin eruption: Secondary | ICD-10-CM | POA: Diagnosis not present

## 2018-08-08 LAB — CUP PACEART INCLINIC DEVICE CHECK
Date Time Interrogation Session: 20190909132605
Implantable Lead Implant Date: 20190610
Implantable Lead Location: 753860
Implantable Pulse Generator Implant Date: 20190610
MDC IDC PG SERIAL: 9007184
Pulse Gen Model: 1272

## 2018-08-16 ENCOUNTER — Ambulatory Visit (HOSPITAL_COMMUNITY)
Admission: RE | Admit: 2018-08-16 | Discharge: 2018-08-16 | Disposition: A | Discharge status: Home / Self Care | Payer: Medicare HMO | Source: Ambulatory Visit | Attending: Internal Medicine | Admitting: Internal Medicine

## 2018-08-16 ENCOUNTER — Encounter (HOSPITAL_COMMUNITY): Payer: Self-pay | Admitting: *Deleted

## 2018-08-16 ENCOUNTER — Other Ambulatory Visit (HOSPITAL_COMMUNITY): Payer: Self-pay | Admitting: *Deleted

## 2018-08-16 ENCOUNTER — Encounter (HOSPITAL_COMMUNITY): Payer: Self-pay | Admitting: Internal Medicine

## 2018-08-16 VITALS — BP 122/80 | HR 70 | Wt 164.2 lb

## 2018-08-16 DIAGNOSIS — K219 Gastro-esophageal reflux disease without esophagitis: Secondary | ICD-10-CM | POA: Insufficient documentation

## 2018-08-16 DIAGNOSIS — I2729 Other secondary pulmonary hypertension: Secondary | ICD-10-CM | POA: Diagnosis not present

## 2018-08-16 DIAGNOSIS — Z8249 Family history of ischemic heart disease and other diseases of the circulatory system: Secondary | ICD-10-CM | POA: Insufficient documentation

## 2018-08-16 DIAGNOSIS — Z8673 Personal history of transient ischemic attack (TIA), and cerebral infarction without residual deficits: Secondary | ICD-10-CM | POA: Diagnosis not present

## 2018-08-16 DIAGNOSIS — I5022 Chronic systolic (congestive) heart failure: Secondary | ICD-10-CM

## 2018-08-16 DIAGNOSIS — Z79899 Other long term (current) drug therapy: Secondary | ICD-10-CM | POA: Diagnosis not present

## 2018-08-16 DIAGNOSIS — I50810 Right heart failure, unspecified: Secondary | ICD-10-CM

## 2018-08-16 DIAGNOSIS — I5032 Chronic diastolic (congestive) heart failure: Secondary | ICD-10-CM | POA: Diagnosis not present

## 2018-08-16 DIAGNOSIS — F419 Anxiety disorder, unspecified: Secondary | ICD-10-CM | POA: Insufficient documentation

## 2018-08-16 DIAGNOSIS — G40409 Other generalized epilepsy and epileptic syndromes, not intractable, without status epilepticus: Secondary | ICD-10-CM | POA: Diagnosis not present

## 2018-08-16 DIAGNOSIS — Z7901 Long term (current) use of anticoagulants: Secondary | ICD-10-CM | POA: Insufficient documentation

## 2018-08-16 DIAGNOSIS — Z885 Allergy status to narcotic agent status: Secondary | ICD-10-CM | POA: Diagnosis not present

## 2018-08-16 DIAGNOSIS — F329 Major depressive disorder, single episode, unspecified: Secondary | ICD-10-CM | POA: Diagnosis not present

## 2018-08-16 DIAGNOSIS — Z950 Presence of cardiac pacemaker: Secondary | ICD-10-CM | POA: Diagnosis not present

## 2018-08-16 DIAGNOSIS — E785 Hyperlipidemia, unspecified: Secondary | ICD-10-CM | POA: Diagnosis not present

## 2018-08-16 DIAGNOSIS — I071 Rheumatic tricuspid insufficiency: Secondary | ICD-10-CM | POA: Diagnosis not present

## 2018-08-16 DIAGNOSIS — I130 Hypertensive heart and chronic kidney disease with heart failure and stage 1 through stage 4 chronic kidney disease, or unspecified chronic kidney disease: Secondary | ICD-10-CM | POA: Insufficient documentation

## 2018-08-16 DIAGNOSIS — I481 Persistent atrial fibrillation: Secondary | ICD-10-CM | POA: Insufficient documentation

## 2018-08-16 DIAGNOSIS — I270 Primary pulmonary hypertension: Secondary | ICD-10-CM

## 2018-08-16 DIAGNOSIS — Z86711 Personal history of pulmonary embolism: Secondary | ICD-10-CM | POA: Insufficient documentation

## 2018-08-16 DIAGNOSIS — N184 Chronic kidney disease, stage 4 (severe): Secondary | ICD-10-CM | POA: Diagnosis not present

## 2018-08-16 DIAGNOSIS — I5082 Biventricular heart failure: Secondary | ICD-10-CM | POA: Diagnosis not present

## 2018-08-16 LAB — CBC
HEMATOCRIT: 41.6 % (ref 36.0–46.0)
Hemoglobin: 12.9 g/dL (ref 12.0–15.0)
MCH: 28.2 pg (ref 26.0–34.0)
MCHC: 31 g/dL (ref 30.0–36.0)
MCV: 91 fL (ref 78.0–100.0)
Platelets: 109 10*3/uL — ABNORMAL LOW (ref 150–400)
RBC: 4.57 MIL/uL (ref 3.87–5.11)
RDW: 18.8 % — ABNORMAL HIGH (ref 11.5–15.5)
WBC: 5.4 10*3/uL (ref 4.0–10.5)

## 2018-08-16 LAB — BASIC METABOLIC PANEL
Anion gap: 14 (ref 5–15)
BUN: 19 mg/dL (ref 8–23)
CHLORIDE: 103 mmol/L (ref 98–111)
CO2: 23 mmol/L (ref 22–32)
Calcium: 9 mg/dL (ref 8.9–10.3)
Creatinine, Ser: 1.47 mg/dL — ABNORMAL HIGH (ref 0.44–1.00)
GFR calc non Af Amer: 32 mL/min — ABNORMAL LOW (ref >60)
GFR, EST AFRICAN AMERICAN: 37 mL/min — AB (ref >60)
Glucose, Bld: 102 mg/dL — ABNORMAL HIGH (ref 70–99)
POTASSIUM: 3.9 mmol/L (ref 3.5–5.1)
SODIUM: 140 mmol/L (ref 135–145)

## 2018-08-16 LAB — PROTIME-INR
INR: 1.84
Prothrombin Time: 21.1 seconds — ABNORMAL HIGH (ref 11.4–15.2)

## 2018-08-16 NOTE — Patient Instructions (Signed)
Labs done today  Your physician has recommended that you have a sleep study. This test records several body functions during sleep, including: brain activity, eye movement, oxygen and carbon dioxide blood levels, heart rate and rhythm, breathing rate and rhythm, the flow of air through your mouth and nose, snoring, body muscle movements, and chest and belly movement.  WILL BE DONE AT [facility]  VQ Scan and chest x-ray will be done at [facility] Catheterization, see instruction sheet  Your physician recommends that you schedule a follow-up appointment in: 4-6 weeks   Pulmonary Artery Catheterization Pulmonary artery catheterization is a procedure that is used to test blood movement through the heart and to monitor the heart's function. In this procedure, a thin, flexible tube (catheter) is passed into the right side of the heart and into the main artery that carries blood from your heart to your lungs (pulmonary artery). The procedure may be used to evaluate or help diagnose various problems, such as:  Heart failure.  Shock.  Leaky heart valves (valvular regurgitation).  Congenital heart disease.  Burns.  Kidney disease.  High blood pressure within the arteries in the lungs (pulmonary hypertension).  A buildup of fluid around the heart that prevents the heart from functioning normally (cardiac tamponade).  A disease that causes the heart muscle to become rigid (restrictive cardiomyopathy).  Abnormal blood flow between two areas of the heart (shunt).  After a heart attack, this procedure may be used to monitor for further problems and to see if medicines are working. The procedure may be done in a cardiac catheterization lab or in an intensive care unit (ICU). Tell a health care provider about:  Any allergies you have.  All medicines you are taking, including vitamins, herbs, eye drops, creams, and over-the-counter medicines.  Any problems you or family members have  had with anesthetic medicines.  Any blood disorders you have.  Any surgeries you have had.  Any medical conditions you have. What are the risks? Generally, this is a safe procedure. However, problems may occur, including:  Bruising or bleeding at the catheter insertion site.  Injury to the vein where the catheter was inserted.  Puncture to the lung. This is a risk if neck or chest veins are used.  The following problems may also occur, but they are very rare:  Abnormal heart rhythms.  Low blood pressure.  Infection.  Cardiac tamponade.  Blocked blood vessel caused by a blood clot or foreign material circulating in the blood (embolism). This can be caused by blood clots at the tip of the catheter.  What happens before the procedure?  Follow instructions from your health care provider about eating or drinking restrictions.  Ask your health care provider about: ? Changing or stopping your regular medicines. This is especially important if you are taking diabetes medicines or blood thinners. ? Taking medicines such as aspirin and ibuprofen. These medicines can thin your blood. Do not take these medicines before your procedure if your health care provider instructs you not to. What happens during the procedure?  An IV tube will be inserted into one of your veins.  You may be given a medicine that helps you relax (sedative).  The area of your body that is chosen for insertion of the catheter will be cleaned. This is usually the neck or groin, but the insertion is sometimes done in another area. ? You will be given a medicine that numbs this area (local anesthetic). ? A small  incision will be made in a vein in this area.  A catheter will be inserted through the incision and into the vein. The health care provider will carefully move the catheter into the upper chamber of the heart (right atrium). X-rays may be used to help guide the catheter to the right place.  The catheter  will be threaded through two heart valves (tricuspid valve and pulmonary valve) and placed into the pulmonary artery.  As soon as the catheter is in place, the blood pressure in the pulmonary artery will be measured.  During the procedure, your heart's rhythm will be watched constantly using an electrocardiogram (ECG).  The catheter will be removed after tests and monitoring have been completed. The procedure may vary among health care providers and hospitals. What happens after the procedure?  Your blood pressure, heart rate, breathing rate, and blood oxygen level will be monitored often until the medicines you were given have worn off. This information is not intended to replace advice given to you by your health care provider. Make sure you discuss any questions you have with your health care provider. Document Released: 03/22/2007 Document Revised: 04/23/2016 Document Reviewed: 11/20/2014 Elsevier Interactive Patient Education  [employer].
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
## 2018-08-16 NOTE — Progress Notes (Signed)
Patient ambulated around clinic oxygen sats maintained 99-100% on room air with HR 84-86.

## 2018-08-17 ENCOUNTER — Telehealth: Payer: Self-pay | Admitting: Cardiovascular Disease

## 2018-08-17 NOTE — Telephone Encounter (Signed)
Form signed and faxed - pt daughter made aware

## 2018-08-17 NOTE — Telephone Encounter (Signed)
Daughter of patient calling about her mom dropping off a form from dentist about her tooth needing work due to abscess.  Stated dentist told them they can not perform work until they get clearance from [place]  Daughter is currently at [facility] and would like a call back today

## 2018-08-19 ENCOUNTER — Other Ambulatory Visit: Payer: Self-pay

## 2018-08-19 DIAGNOSIS — E782 Mixed hyperlipidemia: Secondary | ICD-10-CM | POA: Diagnosis not present

## 2018-08-19 DIAGNOSIS — G4089 Other seizures: Secondary | ICD-10-CM | POA: Diagnosis not present

## 2018-08-19 DIAGNOSIS — K219 Gastro-esophageal reflux disease without esophagitis: Secondary | ICD-10-CM | POA: Diagnosis not present

## 2018-08-19 DIAGNOSIS — H409 Unspecified glaucoma: Secondary | ICD-10-CM | POA: Diagnosis not present

## 2018-08-19 DIAGNOSIS — E559 Vitamin D deficiency, unspecified: Secondary | ICD-10-CM | POA: Diagnosis not present

## 2018-08-19 DIAGNOSIS — R5383 Other fatigue: Secondary | ICD-10-CM | POA: Diagnosis not present

## 2018-08-19 DIAGNOSIS — Z950 Presence of cardiac pacemaker: Secondary | ICD-10-CM | POA: Diagnosis not present

## 2018-08-19 DIAGNOSIS — N183 Chronic kidney disease, stage 3 (moderate): Secondary | ICD-10-CM | POA: Diagnosis not present

## 2018-08-19 DIAGNOSIS — I739 Peripheral vascular disease, unspecified: Secondary | ICD-10-CM | POA: Diagnosis not present

## 2018-08-19 NOTE — Patient Outreach (Signed)
[facility]) Care Management  08/19/2018  Karen Richardson 10-10-1934 [id number]   RNCM contacted Karen Richardson for routine outreach and to schedule home visit. Member reported that she was "fine" but was not available to talk at the time of the call. She reported that she was preparing for an appointment with her new PCP Dr. Margo Aye and requested a call back at another time.   PLAN RNCM will follow on next week.   Katha Cabal [facility] Community Care Manager [phone]

## 2018-08-23 ENCOUNTER — Ambulatory Visit: Payer: Medicare HMO

## 2018-08-23 ENCOUNTER — Other Ambulatory Visit: Payer: Self-pay | Admitting: Family Medicine

## 2018-08-23 ENCOUNTER — Telehealth: Payer: Self-pay | Admitting: *Deleted

## 2018-08-23 ENCOUNTER — Other Ambulatory Visit: Payer: Self-pay

## 2018-08-23 DIAGNOSIS — I10 Essential (primary) hypertension: Secondary | ICD-10-CM | POA: Diagnosis not present

## 2018-08-23 DIAGNOSIS — I5033 Acute on chronic diastolic (congestive) heart failure: Secondary | ICD-10-CM | POA: Diagnosis not present

## 2018-08-23 DIAGNOSIS — I482 Chronic atrial fibrillation: Secondary | ICD-10-CM | POA: Diagnosis not present

## 2018-08-23 DIAGNOSIS — I2721 Secondary pulmonary arterial hypertension: Secondary | ICD-10-CM | POA: Diagnosis not present

## 2018-08-23 DIAGNOSIS — E785 Hyperlipidemia, unspecified: Secondary | ICD-10-CM | POA: Diagnosis not present

## 2018-08-23 DIAGNOSIS — G40909 Epilepsy, unspecified, not intractable, without status epilepticus: Secondary | ICD-10-CM

## 2018-08-23 NOTE — Telephone Encounter (Signed)
Received call from daughter (diane) please call in regards to her mother's coumdin appointment that was scheduled for today.

## 2018-08-23 NOTE — Addendum Note (Signed)
Addended by: Noralee Space on: 08/23/2018 09:58 PM   Modules accepted: Orders

## 2018-08-23 NOTE — Telephone Encounter (Signed)
Spoke with daughter.  Pt did not show up for coumadin appt today.  Daughter doesn't know why.  She reminded them by phone at lunch time.  Pt is scheduled for a Rt heart cath on 9/26.  Daughter says she was not told to hold coumadin.  No mention on letter to pt to hold coumadin, so pt is taking coumadin according to schedule.  Pt will probably need INR check morning of procedure.

## 2018-08-24 NOTE — Patient Outreach (Signed)
[facility]) Care Management  08/23/2018  Karen Richardson 1934/02/14 [id number]   RNCM placed follow up outreach call to [name]. Phone line busy. Will attempt to reach member on Friday 08/26/18.   Katha Cabal [facility] Community Care Manager [phone]

## 2018-08-25 ENCOUNTER — Encounter (HOSPITAL_COMMUNITY): Payer: Self-pay | Admitting: Internal Medicine

## 2018-08-25 ENCOUNTER — Encounter (HOSPITAL_COMMUNITY)
Admission: RE | Disposition: A | Discharge status: Home / Self Care | Payer: Self-pay | Source: Ambulatory Visit | Attending: Internal Medicine

## 2018-08-25 ENCOUNTER — Encounter (HOSPITAL_COMMUNITY): Payer: Self-pay

## 2018-08-25 ENCOUNTER — Ambulatory Visit (HOSPITAL_COMMUNITY)
Admission: RE | Admit: 2018-08-25 | Discharge: 2018-08-25 | Disposition: A | Discharge status: Home / Self Care | Payer: Medicare HMO | Source: Ambulatory Visit | Attending: Internal Medicine | Admitting: Internal Medicine

## 2018-08-25 DIAGNOSIS — Z950 Presence of cardiac pacemaker: Secondary | ICD-10-CM | POA: Insufficient documentation

## 2018-08-25 DIAGNOSIS — Z7901 Long term (current) use of anticoagulants: Secondary | ICD-10-CM | POA: Insufficient documentation

## 2018-08-25 DIAGNOSIS — E785 Hyperlipidemia, unspecified: Secondary | ICD-10-CM | POA: Diagnosis not present

## 2018-08-25 DIAGNOSIS — Z86711 Personal history of pulmonary embolism: Secondary | ICD-10-CM | POA: Insufficient documentation

## 2018-08-25 DIAGNOSIS — Z888 Allergy status to other drugs, medicaments and biological substances status: Secondary | ICD-10-CM | POA: Insufficient documentation

## 2018-08-25 DIAGNOSIS — I2729 Other secondary pulmonary hypertension: Secondary | ICD-10-CM | POA: Insufficient documentation

## 2018-08-25 DIAGNOSIS — I482 Chronic atrial fibrillation: Secondary | ICD-10-CM | POA: Diagnosis not present

## 2018-08-25 DIAGNOSIS — Z8673 Personal history of transient ischemic attack (TIA), and cerebral infarction without residual deficits: Secondary | ICD-10-CM | POA: Insufficient documentation

## 2018-08-25 DIAGNOSIS — I5032 Chronic diastolic (congestive) heart failure: Secondary | ICD-10-CM | POA: Diagnosis not present

## 2018-08-25 DIAGNOSIS — N184 Chronic kidney disease, stage 4 (severe): Secondary | ICD-10-CM | POA: Insufficient documentation

## 2018-08-25 DIAGNOSIS — I130 Hypertensive heart and chronic kidney disease with heart failure and stage 1 through stage 4 chronic kidney disease, or unspecified chronic kidney disease: Secondary | ICD-10-CM | POA: Diagnosis not present

## 2018-08-25 DIAGNOSIS — Z8249 Family history of ischemic heart disease and other diseases of the circulatory system: Secondary | ICD-10-CM | POA: Insufficient documentation

## 2018-08-25 DIAGNOSIS — I071 Rheumatic tricuspid insufficiency: Secondary | ICD-10-CM | POA: Diagnosis not present

## 2018-08-25 DIAGNOSIS — G40409 Other generalized epilepsy and epileptic syndromes, not intractable, without status epilepticus: Secondary | ICD-10-CM | POA: Insufficient documentation

## 2018-08-25 DIAGNOSIS — Z823 Family history of stroke: Secondary | ICD-10-CM | POA: Insufficient documentation

## 2018-08-25 DIAGNOSIS — Z79899 Other long term (current) drug therapy: Secondary | ICD-10-CM | POA: Insufficient documentation

## 2018-08-25 DIAGNOSIS — Z885 Allergy status to narcotic agent status: Secondary | ICD-10-CM | POA: Insufficient documentation

## 2018-08-25 DIAGNOSIS — Z820 Family history of epilepsy and other diseases of the nervous system: Secondary | ICD-10-CM | POA: Diagnosis not present

## 2018-08-25 DIAGNOSIS — I5082 Biventricular heart failure: Secondary | ICD-10-CM | POA: Insufficient documentation

## 2018-08-25 DIAGNOSIS — K219 Gastro-esophageal reflux disease without esophagitis: Secondary | ICD-10-CM | POA: Diagnosis not present

## 2018-08-25 DIAGNOSIS — I270 Primary pulmonary hypertension: Secondary | ICD-10-CM

## 2018-08-25 DIAGNOSIS — J439 Emphysema, unspecified: Secondary | ICD-10-CM | POA: Diagnosis not present

## 2018-08-25 HISTORY — PX: RIGHT HEART CATH: CATH118263

## 2018-08-25 LAB — POCT I-STAT 3, VENOUS BLOOD GAS (G3P V)
Acid-base deficit: 2 mmol/L (ref 0.0–2.0)
Acid-base deficit: 2 mmol/L (ref 0.0–2.0)
BICARBONATE: 22.8 mmol/L (ref 20.0–28.0)
Bicarbonate: 22.3 mmol/L (ref 20.0–28.0)
O2 SAT: 60 %
O2 Saturation: 57 %
PCO2 VEN: 35.8 mmHg — AB (ref 44.0–60.0)
PH VEN: 7.4 (ref 7.250–7.430)
PH VEN: 7.402 (ref 7.250–7.430)
TCO2: 23 mmol/L (ref 22–32)
TCO2: 24 mmol/L (ref 22–32)
pCO2, Ven: 36.8 mmHg — ABNORMAL LOW (ref 44.0–60.0)
pO2, Ven: 30 mmHg — CL (ref 32.0–45.0)
pO2, Ven: 31 mmHg — CL (ref 32.0–45.0)

## 2018-08-25 LAB — PROTIME-INR
INR: 1.93
Prothrombin Time: 21.9 seconds — ABNORMAL HIGH (ref 11.4–15.2)

## 2018-08-25 SURGERY — RIGHT HEART CATH
Anesthesia: LOCAL

## 2018-08-25 MED ORDER — HEPARIN (PORCINE) IN NACL 1000-0.9 UT/500ML-% IV SOLN
INTRAVENOUS | Status: AC
  Filled 2018-08-25: qty 500

## 2018-08-25 MED ORDER — SODIUM CHLORIDE 0.9% FLUSH: 3.0000 mL | Freq: Two times a day (BID) | INTRAVENOUS | Status: DC

## 2018-08-25 MED ORDER — SODIUM CHLORIDE 0.9 % IV SOLN: 250.0000 mL | INTRAVENOUS | Status: DC | PRN

## 2018-08-25 MED ORDER — FENTANYL CITRATE (PF) 100 MCG/2ML IJ SOLN
INTRAMUSCULAR | Status: DC | PRN
  Administered 2018-08-25: 25 ug via INTRAVENOUS

## 2018-08-25 MED ORDER — ACETAMINOPHEN 325 MG PO TABS: 650.0000 mg | ORAL_TABLET | ORAL | Status: DC | PRN

## 2018-08-25 MED ORDER — ONDANSETRON HCL 4 MG/2ML IJ SOLN: 4.0000 mg | Freq: Four times a day (QID) | INTRAMUSCULAR | Status: DC | PRN

## 2018-08-25 MED ORDER — LIDOCAINE HCL (PF) 1 % IJ SOLN
INTRAMUSCULAR | Status: DC | PRN
  Administered 2018-08-25: 2 mL

## 2018-08-25 MED ORDER — LIDOCAINE HCL (PF) 1 % IJ SOLN
INTRAMUSCULAR | Status: AC
  Filled 2018-08-25: qty 30

## 2018-08-25 MED ORDER — SODIUM CHLORIDE 0.9 % IV SOLN
INTRAVENOUS | Status: DC
  Administered 2018-08-25: 10:00:00 via INTRAVENOUS

## 2018-08-25 MED ORDER — SODIUM CHLORIDE 0.9% FLUSH: 3.0000 mL | INTRAVENOUS | Status: DC | PRN

## 2018-08-25 MED ORDER — MIDAZOLAM HCL 2 MG/2ML IJ SOLN
INTRAMUSCULAR | Status: DC | PRN
  Administered 2018-08-25: 1 mg via INTRAVENOUS
  Administered 2018-08-25: 0.5 mg via INTRAVENOUS

## 2018-08-25 MED ORDER — MIDAZOLAM HCL 2 MG/2ML IJ SOLN
INTRAMUSCULAR | Status: AC
  Filled 2018-08-25: qty 2

## 2018-08-25 MED ORDER — FENTANYL CITRATE (PF) 100 MCG/2ML IJ SOLN
INTRAMUSCULAR | Status: AC
  Filled 2018-08-25: qty 2

## 2018-08-25 MED ORDER — ASPIRIN 81 MG PO CHEW: 81.0000 mg | CHEWABLE_TABLET | ORAL | Status: DC

## 2018-08-25 MED ORDER — HEPARIN (PORCINE) IN NACL 1000-0.9 UT/500ML-% IV SOLN
INTRAVENOUS | Status: DC | PRN
  Administered 2018-08-25: 500 mL

## 2018-08-25 SURGICAL SUPPLY — 7 items
CATH BALLN WEDGE 5F 110CM (CATHETERS) ×2 IMPLANT
GUIDEWIRE .025 260CM (WIRE) ×2 IMPLANT
PACK CARDIAC CATHETERIZATION (CUSTOM PROCEDURE TRAY) ×2 IMPLANT
SHEATH GLIDE SLENDER 4/5FR (SHEATH) ×2 IMPLANT
TRANSDUCER W/STOPCOCK (MISCELLANEOUS) ×2 IMPLANT
TUBING ART PRESS 72  MALE/FEM (TUBING) ×1
TUBING ART PRESS 72 MALE/FEM (TUBING) ×1 IMPLANT

## 2018-08-25 NOTE — Progress Notes (Signed)
Unable to monitor bp on pt, seems agitated, moving and twisting in chair, per husband said she gets this way. Pt states she can not have her bp checked again, bp cuff removed.

## 2018-08-25 NOTE — Interval H&P Note (Signed)
History and Physical Interval Note:  08/25/2018 10:47 AM  Karen Richardson  has presented today for surgery, with the diagnosis of Pulmonary hypertension  The various methods of treatment have been discussed with the patient and family. After consideration of risks, benefits and other options for treatment, the patient has consented to  Procedure(s): RIGHT HEART CATH (N/A) as a surgical intervention .  The patient's history has been reviewed, patient examined, no change in status, stable for surgery.  I have reviewed the patient's chart and labs.  Questions were answered to the patient's satisfaction.     Daniel Bensimhon

## 2018-08-25 NOTE — Discharge Instructions (Signed)
Venogram, Care After °This sheet gives you information about how to care for yourself after your procedure. Your health care provider may also give you more specific instructions. If you have problems or questions, contact your health care provider. °What can I expect after the procedure? °After the procedure, it is common to have: °· Bruising or mild discomfort in the area where the IV was inserted (insertion site). ° °Follow these instructions at home: °Eating and drinking °· Follow instructions from your health care provider about eating or drinking restrictions. °· Drink a lot of fluids for the first several days after the procedure, as directed by your health care provider. This helps to wash (flush) the contrast out of your body. Examples of healthy fluids include water or low-calorie drinks. °General instructions °· Check your IV insertion area every day for signs of infection. Check for: °? Redness, swelling, or pain. °? Fluid or blood. °? Warmth. °? Pus or a bad smell. °· Take over-the-counter and prescription medicines only as told by your health care provider. °· Rest and return to your normal activities as told by your health care provider. Ask your health care provider what activities are safe for you. °· Do not drive for 24 hours if you were given a medicine to help you relax (sedative), or until your health care provider approves. °· Keep all follow-up visits as told by your health care provider. This is important. °Contact a health care provider if: °· Your skin becomes itchy or you develop a rash or hives. °· You have a fever that does not get better with medicine. °· You feel nauseous. °· You vomit. °· You have redness, swelling, or pain around the insertion site. °· You have fluid or blood coming from the insertion site. °· Your insertion area feels warm to the touch. °· You have pus or a bad smell coming from the insertion site. °Get help right away if: °· You have difficulty breathing or  shortness of breath. °· You develop chest pain. °· You faint. °· You feel very dizzy. °These symptoms may represent a serious problem that is an emergency. Do not wait to see if the symptoms will go away. Get medical help right away. Call your local emergency services (911 in the [place] Do not drive yourself to the hospital. °Summary °· After your procedure, it is common to have bruising or mild discomfort in the area where the IV was inserted. °· You should check your IV insertion area every day for signs of infection. °· Take over-the-counter and prescription medicines only as told by your health care provider. °· You should drink a lot of fluids for the first several days after the procedure to help flush the contrast from your body. °This information is not intended to replace advice given to you by your health care provider. Make sure you discuss any questions you have with your health care provider. °Document Released: 09/06/2013 Document Revised: 10/10/2016 Document Reviewed: 10/10/2016 °Elsevier Interactive Patient Education © 2017 Elsevier Inc. ° °

## 2018-08-26 ENCOUNTER — Ambulatory Visit (INDEPENDENT_AMBULATORY_CARE_PROVIDER_SITE_OTHER): Payer: Medicare HMO | Admitting: *Deleted

## 2018-08-26 ENCOUNTER — Telehealth: Payer: Self-pay | Admitting: *Deleted

## 2018-08-26 ENCOUNTER — Other Ambulatory Visit: Payer: Self-pay

## 2018-08-26 NOTE — Patient Outreach (Signed)
[facility]) Care Management  08/26/2018  TAMBRE TIPPENS 1934-05-26 [id number]    RNCM contacted Mrs. Ryback for follow up outreach following MD visit and Right Heart Cath.  Member unavailable. Left HIPAA compliant voice message requesting a return call.   PLAN Will attempt to reach member on next week.   Katha Cabal [facility] Community Care Manager [phone]

## 2018-08-26 NOTE — Patient Instructions (Signed)
Increase coumadin to 1.5 tablets daily except 1 tablet on Tuesdays and Saturdays Recheck on 09/07/18 Instructions given to Daughter [name] per phone.  She will inform patient.

## 2018-08-26 NOTE — Telephone Encounter (Signed)
Called with coumadin results from yesterday.  See coumadin note.

## 2018-08-26 NOTE — Telephone Encounter (Signed)
[name] -daughter called about patient's most recent coumdin reading. Please call [phone]

## 2018-08-28 ENCOUNTER — Ambulatory Visit: Payer: Medicare HMO | Attending: Internal Medicine | Admitting: Cardiology

## 2018-08-28 DIAGNOSIS — I10 Essential (primary) hypertension: Secondary | ICD-10-CM | POA: Insufficient documentation

## 2018-08-28 DIAGNOSIS — G4733 Obstructive sleep apnea (adult) (pediatric): Secondary | ICD-10-CM | POA: Diagnosis not present

## 2018-08-28 DIAGNOSIS — I270 Primary pulmonary hypertension: Secondary | ICD-10-CM

## 2018-08-29 NOTE — Procedures (Signed)
   Patient Name:[name], Karen Richardson Date:11/16/2017 08/28/2018 Gender: Female D.O.B: Nov 05, 1934 Age (years): 68 Referring Provider: Bevelyn Buckles Bensimhon Height (inches): 65 Interpreting Physician: Armanda Magic MD, ABSM Weight (lbs): 161 RPSGT: [name] BMI: 27 MRN: [id number] Neck Size: 16.00   CLINICAL INFORMATION  Sleep Study Type: NPSG  Indication for sleep study: Hypertension  Epworth Sleepiness Score: 19  SLEEP STUDY TECHNIQUE  As per the AASM Manual for the Scoring of Sleep and Associated Events v2.3 (April 2016) with a hypopnea requiring 4% desaturations. The channels recorded and monitored were frontal, central and occipital EEG, electrooculogram (EOG), submentalis EMG (chin), nasal and oral airflow, thoracic and abdominal wall motion, anterior tibialis EMG, snore microphone, electrocardiogram, and pulse oximetry.  MEDICATIONS  Medications self-administered by patient taken the night of the study : N/A  SLEEP ARCHITECTURE  The study was initiated at 10:21:00 PM and ended at 4:50:38 AM. Sleep onset time was 48.8 minutes and the sleep efficiency was 25.3%%. The total sleep time was 98.5 minutes. Stage REM latency was N/A minutes. The patient spent 20.3%% of the night in stage N1 sleep, 79.7%% in stage N2 sleep, 0.0%% in stage N3 and 0% in REM. Alpha intrusion was absent. Supine sleep was 3.06%.  RESPIRATORY PARAMETERS  The overall apnea/hypopnea index (AHI) was 25.0 per hour. There were 6 total apneas, including 6 obstructive, 0 central and 0 mixed apneas. There were 35 hypopneas and 0 RERAs. The AHI during Stage REM sleep was N/A per hour. AHI while supine was 39.8 per hour. The mean oxygen saturation was 94.7%. The minimum SpO2 during sleep was 87.0%. moderate snoring was noted during this study.  CARDIAC DATA  The 2 lead EKG demonstrated sinus rhythm. The mean heart rate was N/A beats per minute. Other EKG findings include: None.  LEG MOVEMENT DATA  The  total PLMS were 0 with a resulting PLMS index of 0.0. Associated arousal with leg movement index was 8.5 .  IMPRESSIONS  - Moderate obstructive sleep apnea occurred during this study (AHI = 25.0/h). - No significant central sleep apnea occurred during this study (CAI = 0.0/h). - Mild oxygen desaturation was noted during this study (Min O2 = 87.0%). - The patient snored with moderate snoring volume. - No cardiac abnormalities were noted during this study. - Clinically significant periodic limb movements did not occur during sleep. Associated arousals were significant.  DIAGNOSIS  - Obstructive Sleep Apnea (327.23 [G47.33 ICD-10]  RECOMMENDATIONS  - Therapeutic CPAP titration to determine optimal pressure required to alleviate sleep disordered breathing. - Positional therapy avoiding supine position during sleep. - Avoid alcohol, sedatives and other CNS depressants that may worsen sleep apnea and disrupt normal sleep architecture. - Sleep hygiene should be reviewed to assess factors that may improve sleep quality. - Weight management and regular exercise should be initiated or continued if appropriate.  [Electronically signed] 08/29/2018 10:06 AM  Armanda Magic MD, ABSM

## 2018-08-30 ENCOUNTER — Telehealth: Payer: Self-pay | Admitting: *Deleted

## 2018-08-30 ENCOUNTER — Encounter (HOSPITAL_COMMUNITY)
Admission: RE | Admit: 2018-08-30 | Discharge: 2018-08-30 | Disposition: A | Discharge status: Home / Self Care | Payer: Medicare HMO | Source: Ambulatory Visit | Attending: Internal Medicine | Admitting: Internal Medicine

## 2018-08-30 ENCOUNTER — Encounter (HOSPITAL_COMMUNITY): Payer: Self-pay

## 2018-08-30 ENCOUNTER — Ambulatory Visit: Payer: Self-pay | Admitting: *Deleted

## 2018-08-30 ENCOUNTER — Other Ambulatory Visit: Payer: Self-pay | Admitting: Cardiovascular Disease

## 2018-08-30 DIAGNOSIS — I270 Primary pulmonary hypertension: Secondary | ICD-10-CM | POA: Insufficient documentation

## 2018-08-30 DIAGNOSIS — G4733 Obstructive sleep apnea (adult) (pediatric): Secondary | ICD-10-CM

## 2018-08-30 DIAGNOSIS — Z950 Presence of cardiac pacemaker: Secondary | ICD-10-CM | POA: Diagnosis not present

## 2018-08-30 DIAGNOSIS — I10 Essential (primary) hypertension: Secondary | ICD-10-CM | POA: Diagnosis not present

## 2018-08-30 MED ORDER — TECHNETIUM TO 99M ALBUMIN AGGREGATED
4.0000 | Freq: Once | INTRAVENOUS | Status: AC | PRN
  Administered 2018-08-30: 4.1 via INTRAVENOUS

## 2018-08-30 MED ORDER — TECHNETIUM TC 99M DIETHYLENETRIAME-PENTAACETIC ACID
30.0000 | Freq: Once | INTRAVENOUS | Status: AC | PRN
  Administered 2018-08-30: 31 via RESPIRATORY_TRACT

## 2018-08-30 NOTE — Telephone Encounter (Signed)
Informed patient of sleep study results and patient understanding was verbalized. Patient understands her sleep study showed they have sleep apnea and recommend CPAP titration. Pt is aware and agreeable to these results. 

## 2018-08-30 NOTE — Telephone Encounter (Signed)
-----   Message from Quintella Reichert, MD sent at 08/29/2018 10:08 AM EDT ----- Please let patient know that they have sleep apnea and recommend CPAP titration. Please set up titration in the sleep lab.

## 2018-08-30 NOTE — Telephone Encounter (Signed)
CPAP Titration sent to pre cert.

## 2018-08-31 ENCOUNTER — Encounter (HOSPITAL_COMMUNITY): Payer: Self-pay

## 2018-08-31 ENCOUNTER — Telehealth (HOSPITAL_COMMUNITY): Payer: Self-pay

## 2018-08-31 ENCOUNTER — Telehealth: Payer: Self-pay | Admitting: *Deleted

## 2018-08-31 DIAGNOSIS — L281 Prurigo nodularis: Secondary | ICD-10-CM | POA: Diagnosis not present

## 2018-08-31 NOTE — Telephone Encounter (Signed)
Pt called to get results from [facility] No PE.Marland Kitchen No answer voice mail left for pt to call back

## 2018-08-31 NOTE — Telephone Encounter (Signed)
-----   Message from Dorothea G Jones, CMA sent at 08/30/2018  6:08 PM EDT ----- Regarding: pre cert CPAP titration 

## 2018-08-31 NOTE — Telephone Encounter (Signed)
Pt called and given results from [facility] No PE

## 2018-08-31 NOTE — Telephone Encounter (Signed)
CPAP titration PA request submitted to [employer] via web portal. 

## 2018-09-01 ENCOUNTER — Other Ambulatory Visit: Payer: Self-pay

## 2018-09-01 ENCOUNTER — Telehealth (HOSPITAL_COMMUNITY): Payer: Self-pay | Admitting: *Deleted

## 2018-09-01 DIAGNOSIS — N183 Chronic kidney disease, stage 3 (moderate): Secondary | ICD-10-CM | POA: Diagnosis not present

## 2018-09-01 DIAGNOSIS — I517 Cardiomegaly: Secondary | ICD-10-CM | POA: Diagnosis not present

## 2018-09-01 DIAGNOSIS — G4733 Obstructive sleep apnea (adult) (pediatric): Secondary | ICD-10-CM | POA: Diagnosis not present

## 2018-09-01 DIAGNOSIS — L989 Disorder of the skin and subcutaneous tissue, unspecified: Secondary | ICD-10-CM | POA: Diagnosis not present

## 2018-09-01 DIAGNOSIS — F419 Anxiety disorder, unspecified: Secondary | ICD-10-CM | POA: Diagnosis not present

## 2018-09-01 DIAGNOSIS — Z950 Presence of cardiac pacemaker: Secondary | ICD-10-CM | POA: Diagnosis not present

## 2018-09-01 NOTE — Patient Outreach (Addendum)
[facility]) Care Management  09/01/2018  Karen Richardson 03-27-1934 [id number]    Unsuccessful outreach call to [name]. Left HIPAA compliant voice message requesting a return call.                            ADDENDUM________________ Member returned call and left a voice message. Reported that she was doing well but had several appointments   PLAN Will follow up in two weeks.    Katha Cabal [facility] Community Care Manager [phone]

## 2018-09-01 NOTE — Telephone Encounter (Signed)
° °  Morrie Sheldon from Dr Scharlene Gloss office requesting to speak with W. Avon Gully. Has questions about expediting pre cert/auth.   Please call [phone] ext [phone]

## 2018-09-01 NOTE — Telephone Encounter (Signed)
Returned a call to [place]. She called per request of her PA. States patient was seen today and PA felt that she "looks terrible" and wanted to know if the sleep process can be expedited. She thought the patient had completed the process and is waiting to get a CPAP machine. I explained to her that the [facility] that is showing in Epic is not for CPAP machine but for her to get a titration study.

## 2018-09-01 NOTE — Telephone Encounter (Signed)
none

## 2018-09-01 NOTE — Telephone Encounter (Signed)
Received call from [place] at [facility] office, pt was seen there today and was having SOB, not sleeping well so they wanted her to be seen sooner by our office.  Spoke w/Dr Bensimhon, he advised pt to increase torsemide to 40 mg in AM and 20 mg in PM, if that does not help call back and we can work in sooner if needed.  I called and spoke w/pt, she is aware and agreeable w/plan.  Also called Morrie Sheldon back and left her a detailed message about plan.

## 2018-09-02 ENCOUNTER — Telehealth: Payer: Self-pay | Admitting: *Deleted

## 2018-09-02 DIAGNOSIS — H2512 Age-related nuclear cataract, left eye: Secondary | ICD-10-CM | POA: Diagnosis not present

## 2018-09-02 NOTE — Telephone Encounter (Signed)
Staff message sent to [facility] Auth received. Ok to schedule CPAP titration study.

## 2018-09-02 NOTE — Telephone Encounter (Signed)
-----   Message from Reesa Chew, CMA sent at 08/30/2018  6:08 PM EDT ----- Regarding: pre cert CPAP titration

## 2018-09-05 ENCOUNTER — Telehealth: Payer: Self-pay | Admitting: *Deleted

## 2018-09-05 DIAGNOSIS — I4819 Other persistent atrial fibrillation: Secondary | ICD-10-CM | POA: Diagnosis not present

## 2018-09-05 DIAGNOSIS — J439 Emphysema, unspecified: Secondary | ICD-10-CM | POA: Diagnosis not present

## 2018-09-05 DIAGNOSIS — K219 Gastro-esophageal reflux disease without esophagitis: Secondary | ICD-10-CM | POA: Diagnosis not present

## 2018-09-05 DIAGNOSIS — E785 Hyperlipidemia, unspecified: Secondary | ICD-10-CM | POA: Diagnosis not present

## 2018-09-05 DIAGNOSIS — I2721 Secondary pulmonary arterial hypertension: Secondary | ICD-10-CM | POA: Diagnosis not present

## 2018-09-05 DIAGNOSIS — I482 Chronic atrial fibrillation, unspecified: Secondary | ICD-10-CM | POA: Diagnosis not present

## 2018-09-05 DIAGNOSIS — I10 Essential (primary) hypertension: Secondary | ICD-10-CM | POA: Diagnosis not present

## 2018-09-05 DIAGNOSIS — I2699 Other pulmonary embolism without acute cor pulmonale: Secondary | ICD-10-CM | POA: Diagnosis not present

## 2018-09-05 DIAGNOSIS — I5033 Acute on chronic diastolic (congestive) heart failure: Secondary | ICD-10-CM | POA: Diagnosis not present

## 2018-09-05 NOTE — Telephone Encounter (Signed)
-----   Message from Gaynelle Cage, CMA sent at 09/02/2018 10:34 AM EDT ----- Regarding: RE: pre cert [employer] Auth received. Auth # [id number] Valid dates 09/05/18 to 10/05/18. Ok to schedule. ----- Message ----- From: Reesa Chew, CMA Sent: 08/30/2018   6:08 PM EDT To: [facility] Sleep Studies Subject: pre cert                                       CPAP titration

## 2018-09-05 NOTE — Telephone Encounter (Signed)
Patient is scheduled for CPAP Titration on 09/13/18. Patient understands her titration study will be done at [facility] sleep lab. Patient understands she will receive a letter in a week or so detailing appointment, date, time, and location. Patient understands to call if she does not receive the letter  in a timely manner. Patient agrees with treatment and thanked me for call.

## 2018-09-05 NOTE — Telephone Encounter (Signed)
Message given to Dr.Bensimhon to call patient. Dr.Bensimhon to follow up per Dinah Beers.

## 2018-09-06 ENCOUNTER — Telehealth: Payer: Self-pay | Admitting: *Deleted

## 2018-09-06 NOTE — Telephone Encounter (Signed)
   [facility] Pre-operative Risk Assessment    Request for surgical clearance:  1. What type of surgery is being performed? Left Eye Istent   2. When is this surgery scheduled?  TBD  3. What type of clearance is required (medical clearance vs. Pharmacy clearance to hold med vs. Both)? Medical   4. Are there any medications that need to be held prior to surgery and how long?N/A   5. Practice name and name of physician performing surgery? Dr. Nancy Fetter    6. What is your office phone number: [phone]    7.   What is your office fax number: [phone]  8.   Anesthesia type (None, local, MAC, general) ?Topical with/  IV medication   Leonilda Cozby M 09/06/2018, 10:31 AM  _________________________________________________________________   (provider comments below)

## 2018-09-07 ENCOUNTER — Ambulatory Visit (INDEPENDENT_AMBULATORY_CARE_PROVIDER_SITE_OTHER): Payer: Medicare HMO | Admitting: *Deleted

## 2018-09-07 ENCOUNTER — Encounter: Payer: Self-pay | Admitting: *Deleted

## 2018-09-07 DIAGNOSIS — Z5181 Encounter for therapeutic drug level monitoring: Secondary | ICD-10-CM | POA: Diagnosis not present

## 2018-09-07 DIAGNOSIS — I4891 Unspecified atrial fibrillation: Secondary | ICD-10-CM | POA: Diagnosis not present

## 2018-09-07 DIAGNOSIS — I4819 Other persistent atrial fibrillation: Secondary | ICD-10-CM | POA: Diagnosis not present

## 2018-09-07 DIAGNOSIS — Z7901 Long term (current) use of anticoagulants: Secondary | ICD-10-CM

## 2018-09-07 LAB — POCT INR: INR: 2.1 (ref 2.0–3.0)

## 2018-09-07 NOTE — Telephone Encounter (Signed)
   Primary Cardiologist: Prentice Docker, MD  Chart reviewed as part of pre-operative protocol coverage. Dr. Gala Romney, can you comment on surgeries clearance? You have seen this patient 2 weeks ago and underwent RHC.   Please forward your response to P [facility] DIV PREOP.   Thank you  Manson Passey, PA 09/07/2018, 2:54 PM

## 2018-09-07 NOTE — Patient Instructions (Signed)
Continue coumadin 1 1/2 tablets (3.75mg) daily except 1 tablet (2.5mg) on Tuesdays and Saturdays Recheck in [place] in 3 weeks 

## 2018-09-12 DIAGNOSIS — T148XXA Other injury of unspecified body region, initial encounter: Secondary | ICD-10-CM | POA: Diagnosis not present

## 2018-09-12 DIAGNOSIS — L981 Factitial dermatitis: Secondary | ICD-10-CM | POA: Diagnosis not present

## 2018-09-12 DIAGNOSIS — L859 Epidermal thickening, unspecified: Secondary | ICD-10-CM | POA: Diagnosis not present

## 2018-09-12 NOTE — Telephone Encounter (Signed)
° °  [facility] updated date of procedure 09/15/18

## 2018-09-13 ENCOUNTER — Ambulatory Visit: Payer: Medicare HMO | Attending: Cardiology | Admitting: Cardiology

## 2018-09-13 DIAGNOSIS — G4733 Obstructive sleep apnea (adult) (pediatric): Secondary | ICD-10-CM | POA: Diagnosis not present

## 2018-09-13 NOTE — Telephone Encounter (Signed)
   Primary Cardiologist: Prentice Docker, MD  Chart reviewed as part of pre-operative protocol coverage. Dr. Gala Romney recently evaluated 08/25/18 and performed a RHC. Please refer to telephone note 09/06/18 where Dr. Gala Romney provides clearance. Fransico Michael would be at acceptable risk for the planned procedure without further cardiovascular testing.   I will route this recommendation to the requesting party via Epic fax function and remove from pre-op pool.  Please call with questions.  Callback instructions: - Please call to confirm that fax is received granting clearance for upcoming eye procedure - Please inform the ophthalmology office that the patient is also on coumadin for atrial fibrillation. Further medication clearance may be required if procedure is not compatible with ongoing anticoagulation.   Beatriz Stallion, PA-C 09/13/2018, 3:04 PM

## 2018-09-13 NOTE — Telephone Encounter (Signed)
Ok to proceed. 

## 2018-09-13 NOTE — Telephone Encounter (Signed)
Called Dr. Chase Caller office to confirm if clearance letter has been rcv'd. I lmom if clearance letter not received please call the office [phone] so that we may re-fax clearance letter. I will remove from the call back pool .

## 2018-09-14 ENCOUNTER — Other Ambulatory Visit: Payer: Self-pay

## 2018-09-14 NOTE — Procedures (Signed)
   Patient Name: Karen, Richardson Date: 09/13/2018 Gender: Female D.O.B: 30-Dec-1933 Age (years): 27 Referring Provider: Armanda Magic MD, ABSM Height (inches): 65 Interpreting Physician: Armanda Magic MD, ABSM Weight (lbs): 161 RPSGT: [name] BMI: 27 MRN: [id number] Neck Size: 14.50  CLINICAL INFORMATION  The patient is referred for a CPAP titration to treat sleep apnea.  SLEEP STUDY TECHNIQUE  As per the AASM Manual for the Scoring of Sleep and Associated Events v2.3 (April 2016) with a hypopnea requiring 4% desaturations. The channels recorded and monitored were frontal, central and occipital EEG, electrooculogram (EOG), submentalis EMG (chin), nasal and oral airflow, thoracic and abdominal wall motion, anterior tibialis EMG, snore microphone, electrocardiogram, and pulse oximetry. Continuous positive airway pressure (CPAP) was initiated at the beginning of the study and titrated to treat sleep-disordered breathing.  MEDICATIONS  Medications self-administered by patient taken the night of the study : N/A  TECHNICIAN COMMENTS  Comments added by technician: Patient did not obtain sleep during testing. Patient was restless all through the night. Patient had difficulty initiating sleep. Patient was itching tremendously and became anxious, to the point where test was D/C'D. Patient took a Xanex at 12:30 am, hoping it would calm her itching down but it did not help with her anxiety  Comments added by scorer: N/A  RESPIRATORY PARAMETERS  Optimal PAP Pressure (cm)N/A AHI at Optimal Pressure (/hr)N/A Overall Minimal O2 (%):N/A Supine % at Optimal Pressure (%):N/A Minimal O2 at Optimal Pressure (%):N/A  SLEEP ARCHITECTURE  The study was initiated at 10:45:18 PM and ended at 1:48:51 AM. Sleep onset time was N/A minutes and the sleep efficiency was 0.0%%. The total sleep time was 0 minutes. The patient spent N/A% of the night in stage N1 sleep, N/A% in stage N2 sleep, N/A% in stage  N3 and 0% in REM.Stage REM latency was N/A minutes Wake after sleep onset was 0.0. Alpha intrusion was absent. Supine sleep was N/A%  CARDIAC DATA  The 2 lead EKG demonstrated sinus rhythm, pacemaker generated. The mean heart rate was N/A beats per minute. Other EKG findings include: None.  LEG MOVEMENT DATA  The total Periodic Limb Movements of Sleep (PLMS) were N/A. The PLMS index was N/A. A PLMS index of <15 is considered normal in adults.  IMPRESSIONS  - No snoring was audible during this study. - No cardiac abnormalities were observed during this study.  DIAGNOSIS  - Obstructive Sleep Apnea (327.23 [G47.33 ICD-10])  RECOMMENDATIONS  - Unsuccessful CPAP titration due to inabiltiy to sleep. - Recommend a trial of Auto-CPAP from 4 to 18cm H2O. - Avoid alcohol, sedatives and other CNS depressants that may worsen sleep apnea and disrupt normal sleep architecture. - Sleep hygiene should be reviewed to assess factors that may improve sleep quality. - Weight management and regular exercise should be initiated or continued. - Return to Sleep Center for re-evaluation after 10 weeks of therapy  [Electronically signed] 09/14/2018 01:18 PM  Armanda Magic MD, ABSM Diplomate, American Board of Sleep Medicine

## 2018-09-14 NOTE — Patient Outreach (Signed)
[facility]) Care Management  09/14/2018  Karen Richardson 1934-07-02 [id number]   Successful outreach with Karen Richardson. Her primary concern was severe itching and "scabs" on her back. She stated the itching was briefly relieved with topical lotion and Betamethasone cream. She reported being evaluated by Dermatology and will follow up to discuss test results.  Discussed current health status and care management needs. She is not receiving home health services and reported no current needs. Karen Richardson stated "I know my heart can't be fixed" but felt that her breathing has improved. Stated the itching was worse than the fatigue and shortness of breath related to heart failure. She reported compliance with medications. Morning weight of 157.5lbs. Reported highest weight within the last month was 161lbs.  Plan of care includes follow up with Optometry on 09/15/18, tentative follow up with Dental due to dry mouth, pending follow up with Dermatology and follow up with Dr. Haroldine Laws within two weeks. Karen Richardson reported no immediate concerns but stated that the private health aide was no longer available to assist in the home. Reported that family was available if needed. She declined need for additional services or referral but agreed to contact [facility] if referral was needed.  [facility] CM Care Plan Problem One     Most Recent Value  Care Plan Problem One  Risk for readmission   Role Documenting the Problem One  Care Management [place] for Problem One  Active  [facility]N Long Term Goal   Patient will not have a hospitalization over the next 60 days.   [facility] Long Term Goal Start Date  06/22/18  Interventions for Problem One Long Term Goal  Discussed importance of  medication compliance and reviewed CHF Action Plan. Patient verbalized knowledge of symptoms that require immediate medical attention.  [facility] CM Short Term Goal #1   Over the next 30 days patient will attend MD follow up appointments.   [facility] CM Short Term Goal #1 Start Date  06/22/18  [facility] CM Short Term Goal #1 Met Date  09/14/18  [facility] CM Short Term Goal #2   Over the next 30 days patient will take medications as prescribed.  [facility] CM Short Term Goal #2 Start Date  06/22/18  [facility] CM Short Term Goal #2 Met Date  09/14/18    [facility] CM Care Plan Problem Two     Most Recent Value  Care Plan Problem Two  High risk for falls.  Role Documenting the Problem Two  Care Management [place] for Problem Two  Active  THN Long Term Goal  Patient will not fall over the next 60 days.  THN Long Term Goal Start Date  06/22/18  [facility] Long Term Goal Met Date  09/14/18 [Patient reported no falls but constant fatigue.]  THN CM Short Term Goal #1   Over the next 30 days patient will use cane or walker when ambulating inside and outside of the home.  [facility] CM Short Term Goal #1 Start Date  06/22/18  [facility] CM Short Term Goal #1 Met Date   09/14/18  [facility] CM Short Term Goal #2   Over the next 30 days patient will avoid ambulating when feeling faint or dizzy.  THN CM Short Term Goal #2 Start Date  06/22/18  [facility] CM Short Term Goal #2 Met Date  09/14/18     PLAN Will follow up within a month to reevaluate care management needs.  [place] (  [phone]      

## 2018-09-15 ENCOUNTER — Encounter: Payer: Medicare HMO | Admitting: Family Medicine

## 2018-09-15 DIAGNOSIS — H2512 Age-related nuclear cataract, left eye: Secondary | ICD-10-CM | POA: Diagnosis not present

## 2018-09-15 DIAGNOSIS — H401122 Primary open-angle glaucoma, left eye, moderate stage: Secondary | ICD-10-CM | POA: Diagnosis not present

## 2018-09-16 ENCOUNTER — Telehealth: Payer: Self-pay | Admitting: *Deleted

## 2018-09-16 NOTE — Telephone Encounter (Signed)
-----   Message from Quintella Reichert, MD sent at 09/14/2018  1:24 PM EDT ----- Please let patient know that we could not adequately titrate her due to lack of sleep and will be setting her up on an auto CPAP at home.  Set up OV with me in 10 weeks.

## 2018-09-16 NOTE — Telephone Encounter (Signed)
Called and spoke with patient's daughter Sedalia Muta (per dpr) to inform her of patients CPAP titration results. She voiced understanding states that she will inform patient of results and recommendations. She also mentioned that her mom states that she is not sure if she will be compliant with a mask over her face due to her being claustraphobic. Referral sent to [facility]. Message sent to the schedulers to make a 10 week follow up appointment with TT.

## 2018-09-18 IMAGING — DX DG CHEST 2V
2 series · 2 of 2 positions shown · non-contrast
Comparison: Chest radiograph 05/17/2018.

CLINICAL DATA: Pacemaker.  Shortness of breath.

EXAM:
CHEST - 2 VIEW

[chest lat]
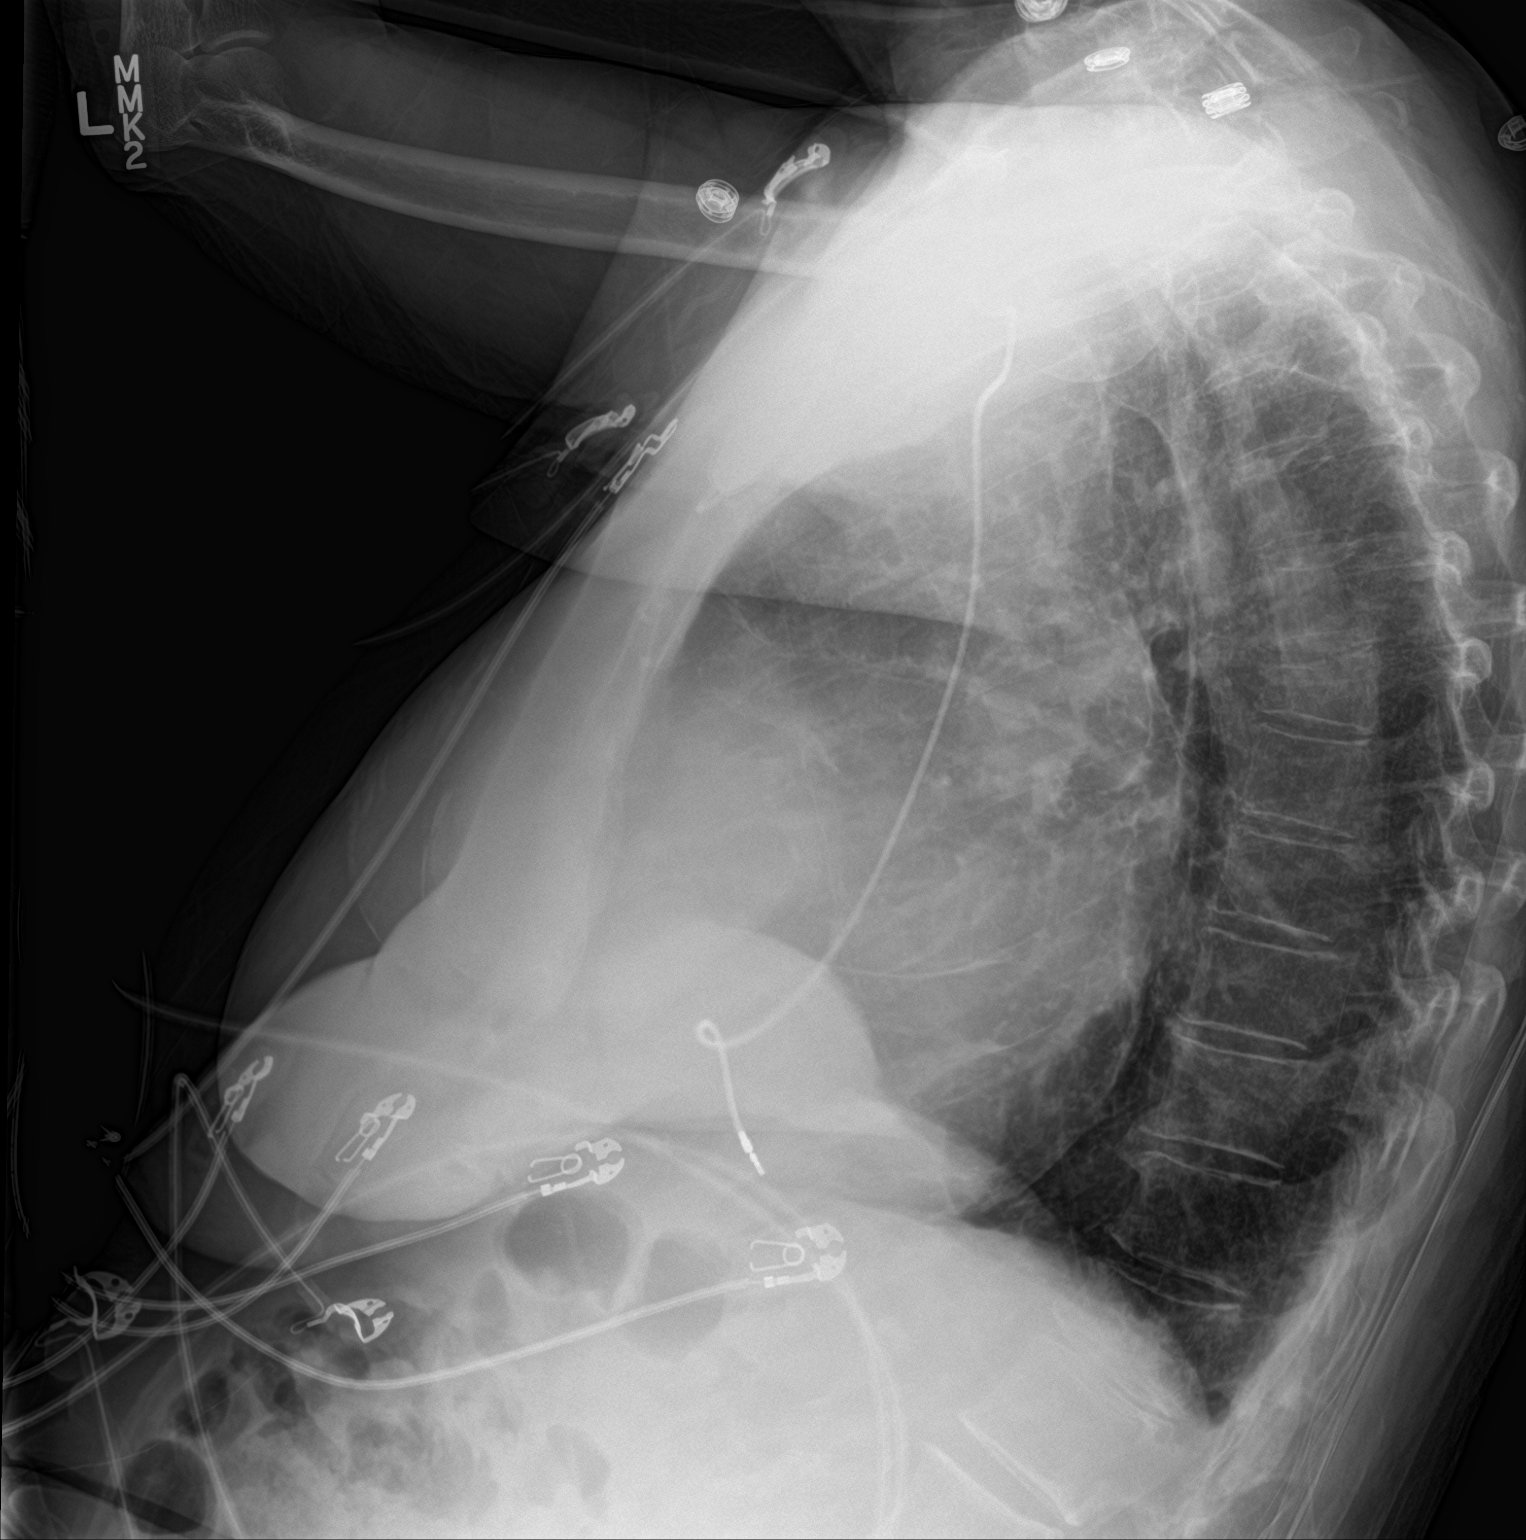

[chest ap]
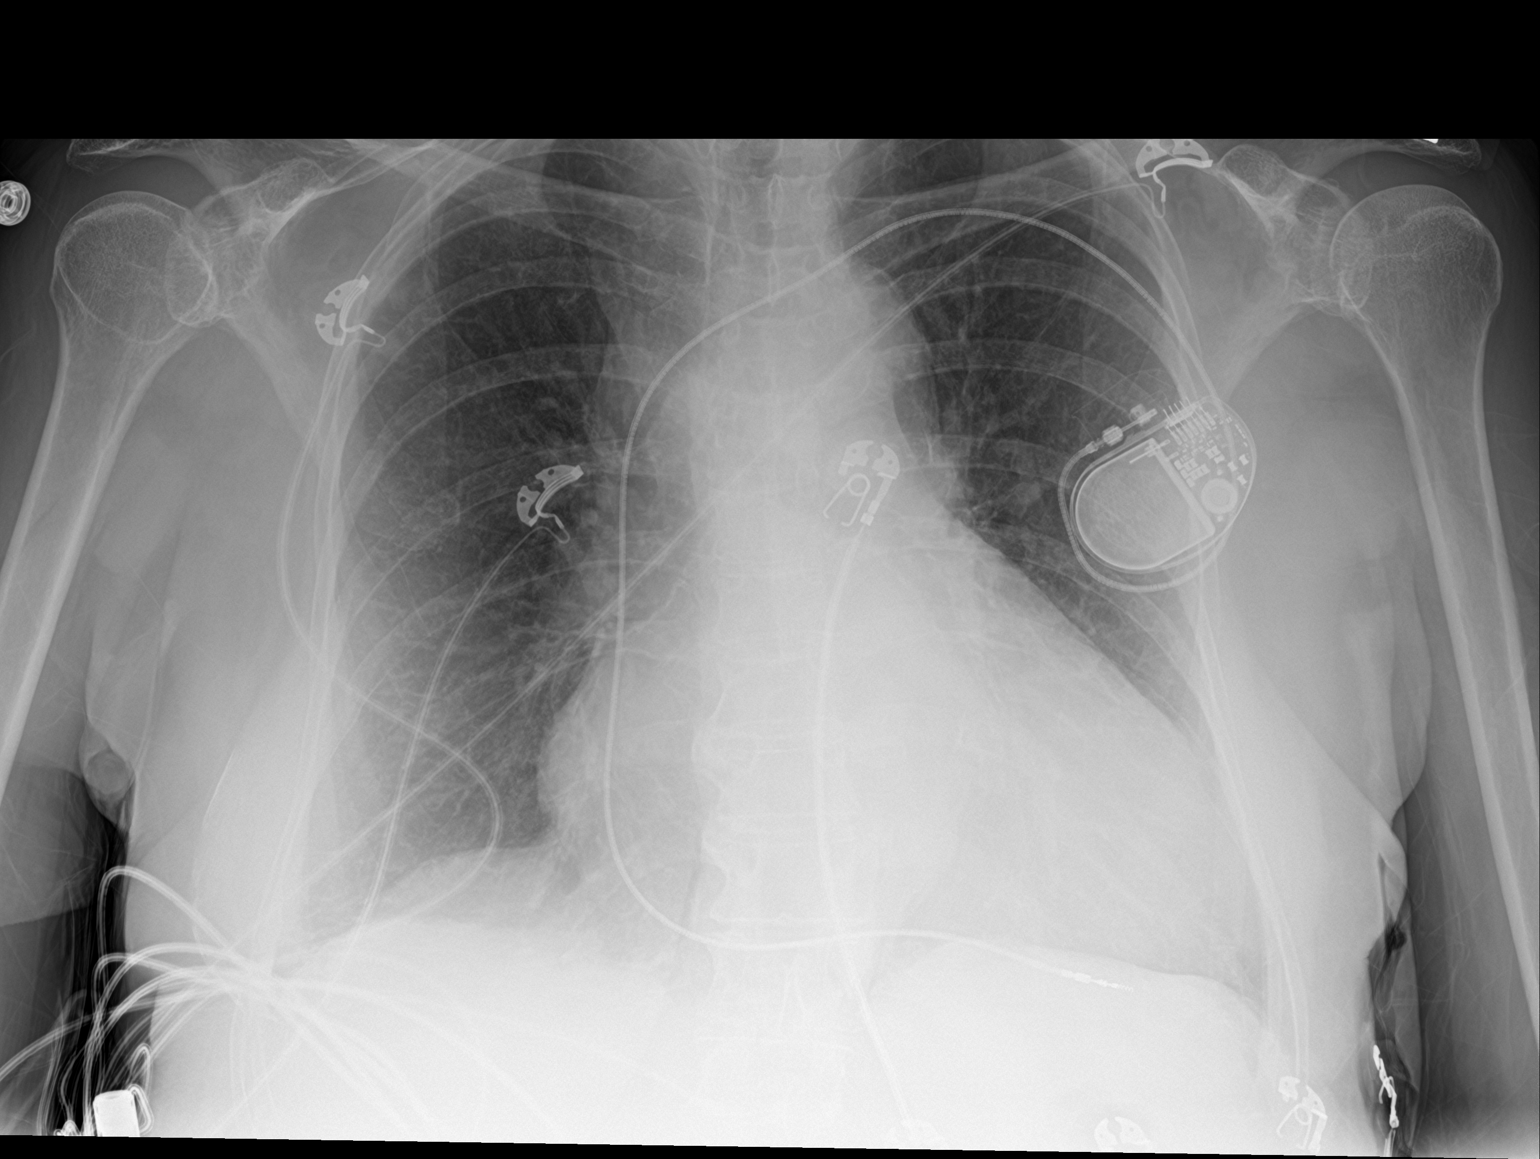

[2 of 2 positions shown; findings below may reference images not displayed]

FINDINGS: Marked enlargement of the cardiac silhouette. No consolidation or
edema. No pneumothorax. Single lead pacer LEFT subclavian approach,
appears unchanged from the [facility] radiograph. Skeletal osteopenia is
noted.
IMPRESSION: Satisfactory pacemaker appearance. No pneumothorax, consolidation,
or edema. Cardiomegaly.

## 2018-09-19 ENCOUNTER — Other Ambulatory Visit: Payer: Self-pay | Admitting: Cardiovascular Disease

## 2018-09-20 DIAGNOSIS — R300 Dysuria: Secondary | ICD-10-CM | POA: Diagnosis not present

## 2018-09-20 DIAGNOSIS — I5033 Acute on chronic diastolic (congestive) heart failure: Secondary | ICD-10-CM | POA: Diagnosis not present

## 2018-09-20 DIAGNOSIS — J439 Emphysema, unspecified: Secondary | ICD-10-CM | POA: Diagnosis not present

## 2018-09-20 DIAGNOSIS — R5383 Other fatigue: Secondary | ICD-10-CM | POA: Diagnosis not present

## 2018-09-20 DIAGNOSIS — E785 Hyperlipidemia, unspecified: Secondary | ICD-10-CM | POA: Diagnosis not present

## 2018-09-20 DIAGNOSIS — I2699 Other pulmonary embolism without acute cor pulmonale: Secondary | ICD-10-CM | POA: Diagnosis not present

## 2018-09-20 DIAGNOSIS — I482 Chronic atrial fibrillation, unspecified: Secondary | ICD-10-CM | POA: Diagnosis not present

## 2018-09-20 DIAGNOSIS — I4819 Other persistent atrial fibrillation: Secondary | ICD-10-CM | POA: Diagnosis not present

## 2018-09-20 DIAGNOSIS — I2721 Secondary pulmonary arterial hypertension: Secondary | ICD-10-CM | POA: Diagnosis not present

## 2018-09-20 DIAGNOSIS — L281 Prurigo nodularis: Secondary | ICD-10-CM | POA: Diagnosis not present

## 2018-09-20 DIAGNOSIS — L299 Pruritus, unspecified: Secondary | ICD-10-CM | POA: Diagnosis not present

## 2018-09-20 DIAGNOSIS — K219 Gastro-esophageal reflux disease without esophagitis: Secondary | ICD-10-CM | POA: Diagnosis not present

## 2018-09-20 DIAGNOSIS — I10 Essential (primary) hypertension: Secondary | ICD-10-CM | POA: Diagnosis not present

## 2018-09-20 DIAGNOSIS — R21 Rash and other nonspecific skin eruption: Secondary | ICD-10-CM | POA: Diagnosis not present

## 2018-09-20 DIAGNOSIS — Z6826 Body mass index (BMI) 26.0-26.9, adult: Secondary | ICD-10-CM | POA: Diagnosis not present
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
## 2018-09-26 ENCOUNTER — Ambulatory Visit (HOSPITAL_COMMUNITY)
Admission: RE | Admit: 2018-09-26 | Discharge: 2018-09-26 | Disposition: A | Discharge status: Home / Self Care | Payer: Medicare HMO | Source: Ambulatory Visit | Attending: Internal Medicine | Admitting: Internal Medicine

## 2018-09-26 VITALS — BP 124/66 | HR 72 | Wt 160.2 lb

## 2018-09-26 DIAGNOSIS — Z86711 Personal history of pulmonary embolism: Secondary | ICD-10-CM | POA: Insufficient documentation

## 2018-09-26 DIAGNOSIS — I5082 Biventricular heart failure: Secondary | ICD-10-CM | POA: Insufficient documentation

## 2018-09-26 DIAGNOSIS — I5032 Chronic diastolic (congestive) heart failure: Secondary | ICD-10-CM | POA: Diagnosis not present

## 2018-09-26 DIAGNOSIS — I4819 Other persistent atrial fibrillation: Secondary | ICD-10-CM | POA: Diagnosis not present

## 2018-09-26 DIAGNOSIS — F329 Major depressive disorder, single episode, unspecified: Secondary | ICD-10-CM | POA: Insufficient documentation

## 2018-09-26 DIAGNOSIS — J439 Emphysema, unspecified: Secondary | ICD-10-CM | POA: Diagnosis not present

## 2018-09-26 DIAGNOSIS — K219 Gastro-esophageal reflux disease without esophagitis: Secondary | ICD-10-CM | POA: Insufficient documentation

## 2018-09-26 DIAGNOSIS — I2729 Other secondary pulmonary hypertension: Secondary | ICD-10-CM | POA: Diagnosis not present

## 2018-09-26 DIAGNOSIS — I130 Hypertensive heart and chronic kidney disease with heart failure and stage 1 through stage 4 chronic kidney disease, or unspecified chronic kidney disease: Secondary | ICD-10-CM | POA: Diagnosis not present

## 2018-09-26 DIAGNOSIS — F419 Anxiety disorder, unspecified: Secondary | ICD-10-CM | POA: Diagnosis not present

## 2018-09-26 DIAGNOSIS — G40409 Other generalized epilepsy and epileptic syndromes, not intractable, without status epilepticus: Secondary | ICD-10-CM | POA: Diagnosis not present

## 2018-09-26 DIAGNOSIS — I50810 Right heart failure, unspecified: Secondary | ICD-10-CM

## 2018-09-26 DIAGNOSIS — Z7901 Long term (current) use of anticoagulants: Secondary | ICD-10-CM | POA: Insufficient documentation

## 2018-09-26 DIAGNOSIS — I482 Chronic atrial fibrillation, unspecified: Secondary | ICD-10-CM | POA: Insufficient documentation

## 2018-09-26 DIAGNOSIS — N184 Chronic kidney disease, stage 4 (severe): Secondary | ICD-10-CM | POA: Diagnosis not present

## 2018-09-26 DIAGNOSIS — E785 Hyperlipidemia, unspecified: Secondary | ICD-10-CM | POA: Diagnosis not present

## 2018-09-26 DIAGNOSIS — Z8249 Family history of ischemic heart disease and other diseases of the circulatory system: Secondary | ICD-10-CM | POA: Diagnosis not present

## 2018-09-26 DIAGNOSIS — I071 Rheumatic tricuspid insufficiency: Secondary | ICD-10-CM | POA: Insufficient documentation

## 2018-09-26 DIAGNOSIS — Z885 Allergy status to narcotic agent status: Secondary | ICD-10-CM | POA: Diagnosis not present

## 2018-09-26 DIAGNOSIS — G4733 Obstructive sleep apnea (adult) (pediatric): Secondary | ICD-10-CM | POA: Insufficient documentation

## 2018-09-26 DIAGNOSIS — Z8673 Personal history of transient ischemic attack (TIA), and cerebral infarction without residual deficits: Secondary | ICD-10-CM | POA: Diagnosis not present

## 2018-09-26 DIAGNOSIS — Z823 Family history of stroke: Secondary | ICD-10-CM | POA: Diagnosis not present

## 2018-09-26 DIAGNOSIS — Z950 Presence of cardiac pacemaker: Secondary | ICD-10-CM | POA: Insufficient documentation

## 2018-09-26 DIAGNOSIS — Z9889 Other specified postprocedural states: Secondary | ICD-10-CM | POA: Insufficient documentation

## 2018-09-26 DIAGNOSIS — Z79899 Other long term (current) drug therapy: Secondary | ICD-10-CM | POA: Insufficient documentation

## 2018-09-26 NOTE — Patient Instructions (Signed)
You have been ordered a PYP Scan.  This is done in the Radiology Department of [facility].  When you come for this test please plan to be there 2-3 hours.  You have been referred to [facility], they will call you to schedule a home visit with you  Keep your appointment with Dr Mayford Knife as scheduled on 12/23/18  Your physician recommends that you schedule a follow-up appointment in: 4 months

## 2018-09-29 ENCOUNTER — Ambulatory Visit (INDEPENDENT_AMBULATORY_CARE_PROVIDER_SITE_OTHER): Payer: Medicare HMO | Admitting: *Deleted

## 2018-09-29 DIAGNOSIS — Z7901 Long term (current) use of anticoagulants: Secondary | ICD-10-CM

## 2018-09-29 DIAGNOSIS — Z5181 Encounter for therapeutic drug level monitoring: Secondary | ICD-10-CM

## 2018-09-29 DIAGNOSIS — I4891 Unspecified atrial fibrillation: Secondary | ICD-10-CM | POA: Diagnosis not present

## 2018-09-29 DIAGNOSIS — G4733 Obstructive sleep apnea (adult) (pediatric): Secondary | ICD-10-CM | POA: Diagnosis not present

## 2018-09-29 LAB — POCT INR: INR: 2.4 (ref 2.0–3.0)

## 2018-09-29 NOTE — Patient Instructions (Signed)
Continue coumadin 1 1/2 tablets (3.75mg ) daily except 1 tablet (2.5mg ) on Tuesdays and Saturdays Recheck in [place] in 3 weeks

## 2018-09-30 ENCOUNTER — Other Ambulatory Visit: Payer: Self-pay

## 2018-10-03 ENCOUNTER — Other Ambulatory Visit: Payer: Self-pay

## 2018-10-03 NOTE — Patient Outreach (Signed)
[facility]) Care Management  09/30/2018  Karen Richardson [birth year] [id number]    Successful outreach with Mr. Mathson. He reported that Mrs. Fortuno has attended her scheduled MD appointments and received recommended treatments. Per Mr. Rovito, the family has discussed her care and pending follow up from [facility]. He reported being confused about [facility] and Home Health agency providing Palliative Care/Hospice outreach. Mr. Lobato was informed of the services provided by [facility] and verbalized understanding that [facility] is not affiliated with [facility]. Informed that RNCM will follow up on status of referral and complete case closure once Mrs. Freytes is active with [facility]. Mr. Braman agreed to follow up outreach within the next week.  PLAN Will contact [facility]. Will follow up with Mr. Kowal with confirmation of referral.   Katha Cabal [facility] Manager [phone]

## 2018-10-03 NOTE — Patient Outreach (Signed)
[facility]) Care Management  10/03/2018  ZERLINA FLAMER 04-Feb-1934 [id number]   Successful outreach with Mr. Camilleri. He reported no urgent concerns with Mrs. Metzgar but stated that she is still pending initial outreach from [facility]. PCP notified. Will follow up on status of referral and request additional order if needed.   PLAN Will follow up with Mr. Caison this week.   Katha Cabal [facility] Community Care Manager [phone]

## 2018-10-04 ENCOUNTER — Other Ambulatory Visit: Payer: Self-pay

## 2018-10-04 NOTE — Patient Outreach (Signed)
[facility]) Care Management  10/04/2018  Karen Richardson Oct 14, 1934 [id number]    Follow up calls with Karen Richardson's PCP office, Cardiologist and Palliative Care Coordinator. Member's referral has been transferred to [facility]. Per Palliative Care Coordinator, intake was approved and initial assessment/home visit is tentatively scheduled for 10/05/18.    PLAN Will follow up on tomorrow to confirm start of services and complete case closure.  Katha Cabal [facility] Community Care Manager [phone]

## 2018-10-05 ENCOUNTER — Other Ambulatory Visit: Payer: Self-pay

## 2018-10-05 DIAGNOSIS — Z950 Presence of cardiac pacemaker: Secondary | ICD-10-CM | POA: Diagnosis not present

## 2018-10-05 DIAGNOSIS — I2721 Secondary pulmonary arterial hypertension: Secondary | ICD-10-CM | POA: Diagnosis not present

## 2018-10-05 DIAGNOSIS — R7301 Impaired fasting glucose: Secondary | ICD-10-CM | POA: Diagnosis not present

## 2018-10-05 DIAGNOSIS — R946 Abnormal results of thyroid function studies: Secondary | ICD-10-CM | POA: Diagnosis not present

## 2018-10-05 DIAGNOSIS — K219 Gastro-esophageal reflux disease without esophagitis: Secondary | ICD-10-CM | POA: Diagnosis not present

## 2018-10-05 DIAGNOSIS — I482 Chronic atrial fibrillation, unspecified: Secondary | ICD-10-CM | POA: Diagnosis not present

## 2018-10-05 DIAGNOSIS — I5033 Acute on chronic diastolic (congestive) heart failure: Secondary | ICD-10-CM | POA: Diagnosis not present

## 2018-10-05 DIAGNOSIS — K921 Melena: Secondary | ICD-10-CM | POA: Diagnosis not present

## 2018-10-05 DIAGNOSIS — I10 Essential (primary) hypertension: Secondary | ICD-10-CM | POA: Diagnosis not present

## 2018-10-05 DIAGNOSIS — I4819 Other persistent atrial fibrillation: Secondary | ICD-10-CM | POA: Diagnosis not present

## 2018-10-05 DIAGNOSIS — I2699 Other pulmonary embolism without acute cor pulmonale: Secondary | ICD-10-CM | POA: Diagnosis not present

## 2018-10-05 DIAGNOSIS — F419 Anxiety disorder, unspecified: Secondary | ICD-10-CM | POA: Diagnosis not present

## 2018-10-05 DIAGNOSIS — N183 Chronic kidney disease, stage 3 (moderate): Secondary | ICD-10-CM | POA: Diagnosis not present

## 2018-10-05 DIAGNOSIS — L281 Prurigo nodularis: Secondary | ICD-10-CM | POA: Diagnosis not present

## 2018-10-05 DIAGNOSIS — I517 Cardiomegaly: Secondary | ICD-10-CM | POA: Diagnosis not present

## 2018-10-05 DIAGNOSIS — J439 Emphysema, unspecified: Secondary | ICD-10-CM | POA: Diagnosis not present

## 2018-10-05 DIAGNOSIS — E785 Hyperlipidemia, unspecified: Secondary | ICD-10-CM | POA: Diagnosis not present

## 2018-10-05 DIAGNOSIS — G4733 Obstructive sleep apnea (adult) (pediatric): Secondary | ICD-10-CM | POA: Diagnosis not present

## 2018-10-05 NOTE — Patient Outreach (Signed)
[facility]) Care Management  10/05/2018  Karen Richardson [birth year] [id number]   Follow up call placed to confirm outreach from [facility]. Mr. Reever confirmed services. He was appreciative of outreach and verbalized understanding of [facility] case closure.  PLAN Will complete case closure.   Katha Cabal [facility] Community Care Manager [phone]

## 2018-10-06 DIAGNOSIS — F419 Anxiety disorder, unspecified: Secondary | ICD-10-CM | POA: Diagnosis not present

## 2018-10-06 DIAGNOSIS — I5033 Acute on chronic diastolic (congestive) heart failure: Secondary | ICD-10-CM | POA: Diagnosis not present

## 2018-10-06 DIAGNOSIS — R609 Edema, unspecified: Secondary | ICD-10-CM | POA: Diagnosis not present

## 2018-10-06 DIAGNOSIS — R0602 Shortness of breath: Secondary | ICD-10-CM | POA: Diagnosis not present

## 2018-10-06 DIAGNOSIS — I272 Pulmonary hypertension, unspecified: Secondary | ICD-10-CM | POA: Diagnosis not present

## 2018-10-07 ENCOUNTER — Encounter (HOSPITAL_COMMUNITY)
Admission: RE | Admit: 2018-10-07 | Discharge: 2018-10-07 | Disposition: A | Discharge status: Home / Self Care | Payer: Medicare HMO | Source: Ambulatory Visit | Attending: Internal Medicine | Admitting: Internal Medicine

## 2018-10-07 DIAGNOSIS — I5032 Chronic diastolic (congestive) heart failure: Secondary | ICD-10-CM | POA: Diagnosis not present

## 2018-10-07 DIAGNOSIS — I509 Heart failure, unspecified: Secondary | ICD-10-CM | POA: Diagnosis not present

## 2018-10-07 MED ORDER — TECHNETIUM TC 99M PYROPHOSPHATE
20.0000 | Freq: Once | INTRAVENOUS | Status: AC
  Administered 2018-10-07: 20 via INTRAVENOUS
  Filled 2018-10-07: qty 20

## 2018-10-10 ENCOUNTER — Encounter (HOSPITAL_COMMUNITY): Payer: Self-pay

## 2018-10-11 ENCOUNTER — Emergency Department (HOSPITAL_COMMUNITY)
Admission: EM | Admit: 2018-10-11 | Discharge: 2018-10-11 | Disposition: A | Discharge status: Home / Self Care | Payer: Medicare HMO | Attending: Emergency Medicine | Admitting: Emergency Medicine

## 2018-10-11 ENCOUNTER — Encounter (HOSPITAL_COMMUNITY): Payer: Self-pay | Admitting: Emergency Medicine

## 2018-10-11 DIAGNOSIS — Z950 Presence of cardiac pacemaker: Secondary | ICD-10-CM | POA: Insufficient documentation

## 2018-10-11 DIAGNOSIS — F419 Anxiety disorder, unspecified: Secondary | ICD-10-CM | POA: Diagnosis not present

## 2018-10-11 DIAGNOSIS — Z79899 Other long term (current) drug therapy: Secondary | ICD-10-CM | POA: Insufficient documentation

## 2018-10-11 DIAGNOSIS — F329 Major depressive disorder, single episode, unspecified: Secondary | ICD-10-CM | POA: Diagnosis not present

## 2018-10-11 DIAGNOSIS — N183 Chronic kidney disease, stage 3 (moderate): Secondary | ICD-10-CM | POA: Insufficient documentation

## 2018-10-11 DIAGNOSIS — I130 Hypertensive heart and chronic kidney disease with heart failure and stage 1 through stage 4 chronic kidney disease, or unspecified chronic kidney disease: Secondary | ICD-10-CM | POA: Insufficient documentation

## 2018-10-11 DIAGNOSIS — R0902 Hypoxemia: Secondary | ICD-10-CM | POA: Diagnosis not present

## 2018-10-11 DIAGNOSIS — R040 Epistaxis: Secondary | ICD-10-CM | POA: Insufficient documentation

## 2018-10-11 DIAGNOSIS — I5030 Unspecified diastolic (congestive) heart failure: Secondary | ICD-10-CM | POA: Insufficient documentation

## 2018-10-11 DIAGNOSIS — I959 Hypotension, unspecified: Secondary | ICD-10-CM | POA: Diagnosis not present

## 2018-10-11 DIAGNOSIS — Z7901 Long term (current) use of anticoagulants: Secondary | ICD-10-CM | POA: Insufficient documentation

## 2018-10-11 LAB — CBC WITH DIFFERENTIAL/PLATELET
Abs Immature Granulocytes: 0.01 10*3/uL (ref 0.00–0.07)
BASOS PCT: 1 %
Basophils Absolute: 0 10*3/uL (ref 0.0–0.1)
EOS ABS: 0.1 10*3/uL (ref 0.0–0.5)
Eosinophils Relative: 1 %
HCT: 35.1 % — ABNORMAL LOW (ref 36.0–46.0)
Hemoglobin: 10.9 g/dL — ABNORMAL LOW (ref 12.0–15.0)
IMMATURE GRANULOCYTES: 0 %
Lymphocytes Relative: 22 %
Lymphs Abs: 1 10*3/uL (ref 0.7–4.0)
MCH: 28.2 pg (ref 26.0–34.0)
MCHC: 31.1 g/dL (ref 30.0–36.0)
MCV: 90.7 fL (ref 80.0–100.0)
MONO ABS: 0.8 10*3/uL (ref 0.1–1.0)
Monocytes Relative: 18 %
NEUTROS ABS: 2.5 10*3/uL (ref 1.7–7.7)
NEUTROS PCT: 58 %
PLATELETS: 133 10*3/uL — AB (ref 150–400)
RBC: 3.87 MIL/uL (ref 3.87–5.11)
RDW: 19.8 % — AB (ref 11.5–15.5)
WBC: 4.4 10*3/uL (ref 4.0–10.5)
nRBC: 0 % (ref 0.0–0.2)

## 2018-10-11 LAB — I-STAT CHEM 8, ED
BUN: 21 mg/dL (ref 8–23)
CREATININE: 1.6 mg/dL — AB (ref 0.44–1.00)
Calcium, Ion: 1.08 mmol/L — ABNORMAL LOW (ref 1.15–1.40)
Chloride: 105 mmol/L (ref 98–111)
Glucose, Bld: 99 mg/dL (ref 70–99)
HEMATOCRIT: 34 % — AB (ref 36.0–46.0)
HEMOGLOBIN: 11.6 g/dL — AB (ref 12.0–15.0)
POTASSIUM: 3.5 mmol/L (ref 3.5–5.1)
SODIUM: 140 mmol/L (ref 135–145)
TCO2: 25 mmol/L (ref 22–32)

## 2018-10-11 LAB — PROTIME-INR
INR: 2.93
PROTHROMBIN TIME: 30.1 s — AB (ref 11.4–15.2)

## 2018-10-11 MED ORDER — HYDROXYZINE HCL 50 MG/ML IM SOLN
25.0000 mg | Freq: Once | INTRAMUSCULAR | Status: AC
  Administered 2018-10-11: 25 mg via INTRAMUSCULAR
  Filled 2018-10-11: qty 1

## 2018-10-11 MED ORDER — SODIUM CHLORIDE 0.9 % IV BOLUS: 1000.0000 mL | Freq: Once | INTRAVENOUS | Status: DC

## 2018-10-11 MED ORDER — LORAZEPAM 2 MG/ML IJ SOLN
0.5000 mg | Freq: Once | INTRAMUSCULAR | Status: AC
  Administered 2018-10-11: 0.5 mg via INTRAVENOUS
  Filled 2018-10-11: qty 1

## 2018-10-11 MED ORDER — OXYMETAZOLINE HCL 0.05 % NA SOLN
NASAL | Status: AC
  Administered 2018-10-11: 12:00:00
  Filled 2018-10-11: qty 15

## 2018-10-11 MED ORDER — SODIUM CHLORIDE 0.9 % IV BOLUS
500.0000 mL | Freq: Once | INTRAVENOUS | Status: AC
  Administered 2018-10-11: 500 mL via INTRAVENOUS

## 2018-10-11 NOTE — ED Notes (Signed)
No active bleeding from nose

## 2018-10-11 NOTE — ED Notes (Signed)
Small amount of blood dripping from rt nare, called edp to bedside.  Instructed to repack nose with afrin soaked 2x2's.

## 2018-10-11 NOTE — ED Triage Notes (Signed)
Nose bleeding from both nares x 1 1/5 hours.  Pt is on coumadin.

## 2018-10-11 NOTE — ED Provider Notes (Addendum)
[facility] EMERGENCY DEPARTMENT Provider Note   CSN: [id number] Arrival date & time: 10/11/18  1033     History   Chief Complaint Chief Complaint  Patient presents with  . Epistaxis    HPI Karen Richardson is a 84 y.o. female.  Patient is on Coumadin and started to have a nosebleed today.  The history is provided by the patient. No language interpreter was used.  Epistaxis   This is a new problem. The current episode started 3 to 5 hours ago. The problem occurs constantly. The problem has not changed since onset.The problem is associated with anticoagulants. The bleeding has been from both nares. The treatment provided no relief. Her past medical history does not include sinus problems.    Past Medical History:  Diagnosis Date  . Acute on chronic diastolic (congestive) heart failure (HCC)   . Acute on chronic diastolic CHF (congestive heart failure) (HCC) 05/17/2018  . Acute on chronic diastolic heart failure (HCC) 06/06/2018  . Anxiety   . Atrial fibrillation with RVR (HCC) 12/01/2016  . Chronic atrial fibrillation    CHA2DS2VASC score 5  Amiodarone stopped due to neuropathy and skin discoloration 2/19 Tikosyn QT prolongation Decision made to leave in a fib and possibly consider AVN ablation  . CKD (chronic kidney disease), stage III (HCC) 12/12/2016  . Depression   . Dysphagia   . GERD (gastroesophageal reflux disease) 12/01/2016  . Grand mal seizure [facility])    "none since ~ 2005; on daily RX" (06/08/2018)  . Heart murmur   . History of esophageal stricture 12/10/2016  . HLD (hyperlipidemia) 06/09/2018  . Hyperlipidemia   . Hypertension    x 4 months  . Migraine    "bad one til I reached age [age]; none since" (06/08/2018)  . Monitoring for long-term anticoagulant use 12/18/2016  . Other secondary pulmonary hypertension (HCC) 07/02/2017  . Persistent atrial fibrillation 05/19/2018  . PONV (postoperative nausea and vomiting)   . Presbyesophagus 12/11/2016  . Presence of permanent  cardiac pacemaker   . Pulmonary emboli (HCC) 12/01/2016  . Pulmonary emphysema (HCC) 09/06/2017  . Pulmonary nodule   . S/P placement of cardiac pacemaker 06/09/2018  . Seizure disorder (HCC) 12/12/2016  . Stroke [facility]) ~ 2018   "in my left eye"     Patient Active Problem List   Diagnosis Date Noted  . HLD (hyperlipidemia) 06/09/2018  . S/P placement of cardiac pacemaker 06/09/2018  . Acute on chronic diastolic heart failure (HCC) 06/06/2018  . Persistent atrial fibrillation 05/19/2018  . Acute on chronic diastolic CHF (congestive heart failure) (HCC) 05/17/2018  . Acute on chronic diastolic (congestive) heart failure (HCC)   . Other social stressor 12/17/2017  . Pulmonary emphysema (HCC) 09/06/2017  . Restrictive lung disease 07/02/2017  . Other secondary pulmonary hypertension (HCC) 07/02/2017  . Monitoring for long-term anticoagulant use 12/18/2016  . CKD (chronic kidney disease), stage III (HCC) 12/12/2016  . Seizure disorder (HCC) 12/12/2016  . Presbyesophagus 12/11/2016  . History of esophageal stricture 12/10/2016  . Pulmonary emboli (HCC) 12/01/2016  . GERD (gastroesophageal reflux disease) 12/01/2016  . Chronic atrial fibrillation   . Hypertension 09/22/2011    Past Surgical History:  Procedure Laterality Date  . ABDOMINAL HYSTERECTOMY     "partial"  . AUGMENTATION MAMMAPLASTY Bilateral   . AV NODE ABLATION N/A 06/03/2018   Procedure: AV NODE ABLATION;  Surgeon: Duke Salvia, MD;  Location: [facility] INVASIVE CV LAB;  Service: Cardiovascular;  Laterality: N/A;  . CARDIOVERSION N/A  05/03/2017   Procedure: CARDIOVERSION;  Surgeon: Elease Hashimoto Deloris Ping, MD;  Location: [facility] ENDOSCOPY;  Service: Cardiovascular;  Laterality: N/A;  . CATARACT EXTRACTION W/ INTRAOCULAR LENS IMPLANT Right 2019  . ESOPHAGEAL DILATION N/A 03/17/2017   Procedure: ESOPHAGEAL DILATION;  Surgeon: Malissa Hippo, MD;  Location: [facility] ENDO SUITE;  Service: Endoscopy;  Laterality: N/A;  . ESOPHAGOGASTRODUODENOSCOPY  N/A 03/17/2017   Procedure: ESOPHAGOGASTRODUODENOSCOPY (EGD);  Surgeon: Malissa Hippo, MD;  Location: [facility] ENDO SUITE;  Service: Endoscopy;  Laterality: N/A;  10:30  . PACEMAKER IMPLANT N/A 05/09/2018   Procedure: PACEMAKER IMPLANT;  Surgeon: Duke Salvia, MD;  Location: [facility] INVASIVE CV LAB;  Service: Cardiovascular;  Laterality: N/A;  . RIGHT HEART CATH N/A 08/25/2018   Procedure: RIGHT HEART CATH;  Surgeon: Dolores Patty, MD;  Location: [facility] INVASIVE CV LAB;  Service: Cardiovascular;  Laterality: N/A;  . TEE WITHOUT CARDIOVERSION N/A 05/06/2018   Procedure: TRANSESOPHAGEAL ECHOCARDIOGRAM (TEE);  Surgeon: Pricilla Riffle, MD;  Location: [facility] ENDOSCOPY;  Service: Cardiovascular;  Laterality: N/A;  . TUMOR EXCISION     from spinal cord ; benign     OB History    Gravida  4   Para  4   Term  4   Preterm      AB      Living  2     SAB      TAB      Ectopic      Multiple      Live Births               Home Medications    Prior to Admission medications   Medication Sig Start Date End Date Taking? Authorizing Provider  atorvastatin (LIPITOR) 10 MG tablet TAKE ONE TABLET BY MOUTH DAILY (BEDTIME) Patient taking differently: Take 10 mg by mouth at bedtime.  07/19/18   Laqueta Linden, MD  carbamazepine (TEGRETOL XR) 100 MG 12 hr tablet TAKE ONE TABLET BY MOUTH BID(MORNING ,BEDTIME) 08/23/18   Aliene Beams, MD  carvedilol (COREG) 6.25 MG tablet Take 1 tablet (6.25 mg total) by mouth 2 (two) times daily with a meal. 06/09/18   Georgie Chard D, NP  Famotidine (PEPCID AC PO) Take by mouth.    [provider]  latanoprost (XALATAN) 0.005 % ophthalmic solution Place 1 drop into both eyes at bedtime. 08/11/18   [provider]  mupirocin ointment (BACTROBAN) 2 % Place 1 application into the nose 4 (four) times daily.    [provider]  pantoprazole (PROTONIX) 40 MG tablet Take 40 mg by mouth daily.    [provider]  potassium chloride  (K-DUR,KLOR-CON) 10 MEQ tablet Take 2 tablets (20 mEq total) by mouth 2 (two) times daily. 07/12/18 09/26/18  Laqueta Linden, MD  torsemide (DEMADEX) 20 MG tablet Take 40 mg by mouth daily. May take additional 20mg  by 2:00 pm if needed    [provider]  torsemide (DEMADEX) 20 MG tablet TAKE TWO (2) TABLETS BY MOUTH IN THE MORNING. TAKE ADDITIONAL TABLET AT 2:00 PM 09/19/18   Laqueta Linden, MD  Vitamin D, Ergocalciferol, (DRISDOL) 50000 units CAPS capsule TAKE ONE CAPSULE BY MOUTH EVERY 7 DAYS (FRIDAY MORNING) Patient taking differently: Take 50,000 Units by mouth every Friday.  06/29/18   Aliene Beams, MD  warfarin (COUMADIN) 2.5 MG tablet TAKE AS DIRECTED BY THE ANTICOAGULATION CLINIC 08/31/18   Laqueta Linden, MD    Family History Family History  Problem Relation Age of  Onset  . Pulmonary embolism Mother   . Colon cancer Mother        mass in colon  . Stroke Father   . Alcohol abuse Father   . CVA Father   . COPD Father   . Diabetes Sister   . Heart failure Sister   . Brain cancer Sister   . Pancreatic cancer Sister   . Epilepsy Sister   . Schizophrenia Brother   . Alcohol abuse Brother   . Lung disease Neg Hx     Social History Social History   Tobacco Use  . Smoking status: Never Smoker  . Smokeless tobacco: Never Used  . Tobacco comment: Father as a child   Substance Use Topics  . Alcohol use: Never    Frequency: Never  . Drug use: Never     Allergies   Codeine; Amiodarone; and Mirtazapine   Review of Systems Review of Systems  Constitutional: Negative for appetite change and fatigue.  HENT: Positive for nosebleeds. Negative for congestion, ear discharge and sinus pressure.   Eyes: Negative for discharge.  Respiratory: Negative for cough.   Cardiovascular: Negative for chest pain.  Gastrointestinal: Negative for abdominal pain and diarrhea.  Genitourinary: Negative for frequency and hematuria.  Musculoskeletal: Negative for back  pain.  Skin: Negative for rash.  Neurological: Negative for seizures and headaches.  Psychiatric/Behavioral: Negative for hallucinations.     Physical Exam Updated Vital Signs BP 111/62   Pulse 71   Temp 98 F (36.7 C) (Axillary)   Ht 5\' 5"  (1.651 m)   Wt 72.6 kg   SpO2 97%   BMI 26.63 kg/m   Physical Exam  Constitutional: She is oriented to person, place, and time. She appears well-developed.  HENT:  Head: Normocephalic.  Bleeding from both nostrils dried blood in mouth  Eyes: Conjunctivae and EOM are normal. No scleral icterus.  Neck: Neck supple. No thyromegaly present.  Cardiovascular: Normal rate and regular rhythm. Exam reveals no gallop and no friction rub.  No murmur heard. Pulmonary/Chest: No stridor. She has no wheezes. She has no rales. She exhibits no tenderness.  Abdominal: She exhibits no distension. There is no tenderness. There is no rebound.  Musculoskeletal: Normal range of motion. She exhibits no edema.  Lymphadenopathy:    She has no cervical adenopathy.  Neurological: She is oriented to person, place, and time. She exhibits normal muscle tone. Coordination normal.  Skin: No rash noted. No erythema.  Psychiatric: She has a normal mood and affect. Her behavior is normal.     ED Treatments / Results  Labs (all labs ordered are listed, but only abnormal results are displayed) Labs Reviewed  CBC WITH DIFFERENTIAL/PLATELET - Abnormal; Notable for the following components:      Result Value   Hemoglobin 10.9 (*)    HCT 35.1 (*)    RDW 19.8 (*)    Platelets 133 (*)    All other components within normal limits  PROTIME-INR - Abnormal; Notable for the following components:   Prothrombin Time 30.1 (*)    All other components within normal limits  I-STAT CHEM 8, ED - Abnormal; Notable for the following components:   Creatinine, Ser 1.60 (*)    Calcium, Ion 1.08 (*)    Hemoglobin 11.6 (*)    HCT 34.0 (*)    All other components within normal limits     EKG None  Radiology No results found.  Procedures Procedures (including critical care time)  Medications Ordered in ED  Medications  oxymetazoline (AFRIN) 0.05 % nasal spray (  Given 10/11/18 1223)  sodium chloride 0.9 % bolus 500 mL (0 mLs Intravenous Stopped 10/11/18 1342)  hydrOXYzine (VISTARIL) injection 25 mg (25 mg Intramuscular Given 10/11/18 1211)  LORazepam (ATIVAN) injection 0.5 mg (0.5 mg Intravenous Given 10/11/18 1314)     Initial Impression / Assessment and Plan / ED Course  I have reviewed the triage vital signs and the nursing notes.  Pertinent labs & imaging results that were available during my care of the patient were reviewed by me and considered in my medical decision making (see chart for details).    Both nostrils were packed with Afrin-soaked gauze.  Bleeding eventually stopped.  The gauze was removed out of the left nostril.  The bleeding did not continue.  The gauze was left in the right nostril.  Patient will follow-up with her family doctor or Dr. Lazarus Salines ENT in 2 to 3 days.  If she has a problem she will come back to the emergency department.  Patient was instructed not to take her Coumadin today  Final Clinical Impressions(s) / ED Diagnoses   Final diagnoses:  Epistaxis    ED Discharge Orders    None       Bethann Berkshire, MD 10/11/18 1429    Bethann Berkshire, MD 10/11/18 1429

## 2018-10-11 NOTE — Discharge Instructions (Addendum)
Follow-up with Dr. Lazarus Salines or your family doctor in 2 to 3 days.  If the bleeding gets severe again return to the emergency department.  Do not take your Coumadin today

## 2018-10-11 NOTE — ED Notes (Signed)
Nose packed with  One 2x2 per nare soaked with afrin

## 2018-10-20 ENCOUNTER — Telehealth: Payer: Self-pay | Admitting: *Deleted

## 2018-10-20 NOTE — Telephone Encounter (Signed)
Spoke with patient's daughter.  Her mother missed INR appt today because she wasn't feeling well.  States pt has hospice coming to the home now.  Told her Hospice could to INR checks.  Called Hospice and order given to Lucrezia Europe RN for INR to be checked tomorrow 10/21/18 with results to [facility] office.  Daughter was appreciative.

## 2018-10-21 ENCOUNTER — Ambulatory Visit (INDEPENDENT_AMBULATORY_CARE_PROVIDER_SITE_OTHER): Admitting: Pharmacist

## 2018-10-21 DIAGNOSIS — Z5181 Encounter for therapeutic drug level monitoring: Secondary | ICD-10-CM

## 2018-10-21 DIAGNOSIS — Z7901 Long term (current) use of anticoagulants: Secondary | ICD-10-CM | POA: Diagnosis not present

## 2018-10-21 DIAGNOSIS — I482 Chronic atrial fibrillation, unspecified: Secondary | ICD-10-CM

## 2018-10-21 DIAGNOSIS — I2699 Other pulmonary embolism without acute cor pulmonale: Secondary | ICD-10-CM

## 2018-10-21 LAB — POCT INR: INR: 3.5 — AB (ref 2.0–3.0)

## 2018-10-24 ENCOUNTER — Ambulatory Visit (INDEPENDENT_AMBULATORY_CARE_PROVIDER_SITE_OTHER): Admitting: *Deleted

## 2018-10-24 DIAGNOSIS — Z5181 Encounter for therapeutic drug level monitoring: Secondary | ICD-10-CM | POA: Diagnosis not present

## 2018-10-24 DIAGNOSIS — I482 Chronic atrial fibrillation, unspecified: Secondary | ICD-10-CM | POA: Diagnosis not present

## 2018-10-24 LAB — POCT INR: INR: 1.5 — AB (ref 2.0–3.0)

## 2018-10-24 NOTE — Patient Instructions (Signed)
Take coumadin 5mg  tonight and tomorrow night, 3.75mg  Wednesday and Thursday Recheck INR on Friday Email instructions to Daughter:  [email or web address]

## 2018-10-28 LAB — POCT INR: INR: 2.1 (ref 2.0–3.0)

## 2018-10-29 DIAGNOSIS — G4733 Obstructive sleep apnea (adult) (pediatric): Secondary | ICD-10-CM | POA: Diagnosis not present

## 2018-10-31 ENCOUNTER — Ambulatory Visit (INDEPENDENT_AMBULATORY_CARE_PROVIDER_SITE_OTHER): Payer: Medicare HMO | Admitting: *Deleted

## 2018-10-31 ENCOUNTER — Ambulatory Visit: Payer: Self-pay | Admitting: *Deleted

## 2018-10-31 ENCOUNTER — Telehealth: Payer: Self-pay | Admitting: *Deleted

## 2018-10-31 LAB — POCT INR: INR: 2 (ref 2.0–3.0)

## 2018-10-31 NOTE — Telephone Encounter (Signed)
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 Also the medicine is $225,000 per year and Hospice will not pay for it. I can talk to her about it if she has further questions.

## 2018-10-31 NOTE — Patient Instructions (Signed)
Take coumadin 3.75mg  daily until INR check on Friday 11/04/18 Order given to Ardelle Balls RN [facility] Email instructions to Daughter:  [email or web address]

## 2018-10-31 NOTE — Telephone Encounter (Signed)
-----   Message -----  From: Fransico Michael  Sent: 10/31/2018  9:50 AM EST  To: [facility] Triage  Subject: Visit Follow-Up Question               [name], Richardson daughter. Her birthdate is 1934-02-19. She is so weak that I wondered if you could read the pacemaker device from her home?  She has an appointment on Thursday in your office. Also, could you consider changing that medication carvedilol to something else. She still has back sores and nothing has helped her. It's quite agonizing. She is under hospice care of [facility] and their Dr. is in charge of her. I think his name is Dr. Mariane Duval. Please advise and thank you in advance.  Dot Been  [phone]

## 2018-10-31 NOTE — Telephone Encounter (Signed)
Spoke with [name] regarding update from Dr. Gala Romney. She verbalizes understanding of information and is aware to call back if she has further questions regarding the cardiac amyloid medication.

## 2018-10-31 NOTE — Telephone Encounter (Signed)
Spoke with [name] (DPR). Advised that appointment on 11/03/18 is a remote transmission and should be automatic. Next in-office f/u with Dr. Graciela Husbands is not due until 05/2019.  Confirmed that patient is receiving hospice care through [facility] under Dr. Cleone Slim.  [name] is very concerned that either torsemide or carvedilol is causing patient's pruitus and sores as the symptoms developed after starting on those meds. She reports that the patient is "clawing herself to death." She states the dermatologist told them it was due to her nerves, hospice MD feels it is because of kidney failure, but daughters feel it is medication-related. [name] requests recommendations from Dr. Graciela Husbands as to whether they can stop or change one or both of these medications. She agrees to continue these medications until instructed otherwise.  [name] requests recommendations from Dr. Gala Romney regarding patient's recent PYP scan results.

## 2018-10-31 NOTE — Patient Instructions (Signed)
Take coumadin 3.75mg  Friday, 2.5mg  Sat and 3.75mg  on Sunday. Recheck INR on Monday Email instructions to Daughter:  [email or web address]

## 2018-10-31 NOTE — Telephone Encounter (Signed)
See phone note dated 10/31/18.

## 2018-11-03 ENCOUNTER — Ambulatory Visit (INDEPENDENT_AMBULATORY_CARE_PROVIDER_SITE_OTHER)

## 2018-11-03 DIAGNOSIS — I4891 Unspecified atrial fibrillation: Secondary | ICD-10-CM

## 2018-11-03 DIAGNOSIS — I482 Chronic atrial fibrillation, unspecified: Secondary | ICD-10-CM | POA: Diagnosis not present

## 2018-11-03 NOTE — Progress Notes (Signed)
Remote pacemaker transmission.   

## 2018-11-04 ENCOUNTER — Telehealth: Payer: Self-pay | Admitting: Cardiovascular Disease

## 2018-11-04 ENCOUNTER — Ambulatory Visit (INDEPENDENT_AMBULATORY_CARE_PROVIDER_SITE_OTHER): Admitting: *Deleted

## 2018-11-04 DIAGNOSIS — I482 Chronic atrial fibrillation, unspecified: Secondary | ICD-10-CM | POA: Diagnosis not present

## 2018-11-04 DIAGNOSIS — Z5181 Encounter for therapeutic drug level monitoring: Secondary | ICD-10-CM | POA: Diagnosis not present

## 2018-11-04 LAB — POCT INR: INR: 1.8 — AB (ref 2.0–3.0)

## 2018-11-04 NOTE — Telephone Encounter (Signed)
Done.  See coumadin note. 

## 2018-11-04 NOTE — Telephone Encounter (Signed)
inr 1.8

## 2018-11-04 NOTE — Progress Notes (Signed)
Done.  See coumadin note. 

## 2018-11-04 NOTE — Patient Instructions (Signed)
Take coumadin 5mg  tonight then continue 3.75mg  daily until INR check on 11/10/18 Order given to Ardelle Balls RN Hospice Email instructions to Daughter:  [email or web address]

## 2018-11-09 ENCOUNTER — Encounter: Payer: Self-pay | Admitting: Cardiology

## 2018-11-10 ENCOUNTER — Ambulatory Visit: Payer: Self-pay | Admitting: *Deleted

## 2018-11-10 DIAGNOSIS — Z5181 Encounter for therapeutic drug level monitoring: Secondary | ICD-10-CM

## 2018-11-10 DIAGNOSIS — Z7901 Long term (current) use of anticoagulants: Principal | ICD-10-CM

## 2018-11-10 DIAGNOSIS — I482 Chronic atrial fibrillation, unspecified: Secondary | ICD-10-CM

## 2018-11-10 LAB — POCT INR: INR: 2 (ref 2.0–3.0)

## 2018-11-10 NOTE — Patient Instructions (Signed)
Increase coumadin to 3.75mg  daily except 5mg  on Mondays and Thursdays Recheck in 1 week Order given to [facility] Email instructions to Daughter:  [email or web address]

## 2018-11-17 ENCOUNTER — Ambulatory Visit (INDEPENDENT_AMBULATORY_CARE_PROVIDER_SITE_OTHER): Admitting: *Deleted

## 2018-11-17 DIAGNOSIS — Z5181 Encounter for therapeutic drug level monitoring: Secondary | ICD-10-CM | POA: Diagnosis not present

## 2018-11-17 DIAGNOSIS — I482 Chronic atrial fibrillation, unspecified: Secondary | ICD-10-CM

## 2018-11-17 LAB — POCT INR: INR: 2.8 (ref 2.0–3.0)

## 2018-11-17 NOTE — Patient Instructions (Signed)
Continue coumadin 3.75mg  daily except 5mg  on Mondays and Thursdays Recheck 12/24 am Order given to [facility]

## 2018-11-22 ENCOUNTER — Ambulatory Visit (INDEPENDENT_AMBULATORY_CARE_PROVIDER_SITE_OTHER): Admitting: *Deleted

## 2018-11-22 DIAGNOSIS — Z5181 Encounter for therapeutic drug level monitoring: Secondary | ICD-10-CM | POA: Diagnosis not present

## 2018-11-22 DIAGNOSIS — I482 Chronic atrial fibrillation, unspecified: Secondary | ICD-10-CM | POA: Diagnosis not present

## 2018-11-22 LAB — POCT INR: INR: 2.6 (ref 2.0–3.0)

## 2018-11-22 NOTE — Patient Instructions (Signed)
Continue coumadin 3.75mg  daily except 5mg  on Mondays and Thursdays Recheck 12/01/18 Order given to [employer] RN [facility]

## 2018-11-28 ENCOUNTER — Other Ambulatory Visit: Payer: Self-pay

## 2018-11-28 ENCOUNTER — Encounter (HOSPITAL_COMMUNITY): Payer: Self-pay | Admitting: *Deleted

## 2018-11-28 ENCOUNTER — Emergency Department (HOSPITAL_COMMUNITY)
Admission: EM | Admit: 2018-11-28 | Discharge: 2018-11-28 | Disposition: A | Discharge status: Home / Self Care | Payer: Medicare Other | Attending: Emergency Medicine | Admitting: Emergency Medicine

## 2018-11-28 ENCOUNTER — Emergency Department (HOSPITAL_COMMUNITY): Payer: Medicare Other

## 2018-11-28 DIAGNOSIS — N183 Chronic kidney disease, stage 3 (moderate): Secondary | ICD-10-CM | POA: Insufficient documentation

## 2018-11-28 DIAGNOSIS — S01111A Laceration without foreign body of right eyelid and periocular area, initial encounter: Secondary | ICD-10-CM | POA: Insufficient documentation

## 2018-11-28 DIAGNOSIS — S0181XA Laceration without foreign body of other part of head, initial encounter: Secondary | ICD-10-CM

## 2018-11-28 DIAGNOSIS — S0990XA Unspecified injury of head, initial encounter: Secondary | ICD-10-CM

## 2018-11-28 DIAGNOSIS — Y9301 Activity, walking, marching and hiking: Secondary | ICD-10-CM | POA: Diagnosis not present

## 2018-11-28 DIAGNOSIS — S0590XA Unspecified injury of unspecified eye and orbit, initial encounter: Secondary | ICD-10-CM | POA: Diagnosis not present

## 2018-11-28 DIAGNOSIS — Z23 Encounter for immunization: Secondary | ICD-10-CM | POA: Diagnosis not present

## 2018-11-28 DIAGNOSIS — Y929 Unspecified place or not applicable: Secondary | ICD-10-CM | POA: Diagnosis not present

## 2018-11-28 DIAGNOSIS — I130 Hypertensive heart and chronic kidney disease with heart failure and stage 1 through stage 4 chronic kidney disease, or unspecified chronic kidney disease: Secondary | ICD-10-CM | POA: Diagnosis not present

## 2018-11-28 DIAGNOSIS — Z79899 Other long term (current) drug therapy: Secondary | ICD-10-CM | POA: Insufficient documentation

## 2018-11-28 DIAGNOSIS — I5032 Chronic diastolic (congestive) heart failure: Secondary | ICD-10-CM | POA: Diagnosis not present

## 2018-11-28 DIAGNOSIS — W01198A Fall on same level from slipping, tripping and stumbling with subsequent striking against other object, initial encounter: Secondary | ICD-10-CM | POA: Diagnosis not present

## 2018-11-28 DIAGNOSIS — Z7901 Long term (current) use of anticoagulants: Secondary | ICD-10-CM | POA: Diagnosis not present

## 2018-11-28 DIAGNOSIS — Y999 Unspecified external cause status: Secondary | ICD-10-CM | POA: Insufficient documentation

## 2018-11-28 DIAGNOSIS — W19XXXA Unspecified fall, initial encounter: Secondary | ICD-10-CM | POA: Diagnosis not present

## 2018-11-28 DIAGNOSIS — R0902 Hypoxemia: Secondary | ICD-10-CM | POA: Diagnosis not present

## 2018-11-28 MED ORDER — TETANUS-DIPHTH-ACELL PERTUSSIS 5-2.5-18.5 LF-MCG/0.5 IM SUSP
0.5000 mL | Freq: Once | INTRAMUSCULAR | Status: AC
  Administered 2018-11-28: 0.5 mL via INTRAMUSCULAR
  Filled 2018-11-28: qty 0.5

## 2018-11-28 MED ORDER — LIDOCAINE-EPINEPHRINE (PF) 2 %-1:200000 IJ SOLN
10.0000 mL | Freq: Once | INTRAMUSCULAR | Status: AC
  Administered 2018-11-28: 10 mL
  Filled 2018-11-28: qty 10

## 2018-11-28 NOTE — ED Triage Notes (Signed)
Pt was up walking to the bathroom with her walker and pt fell over her walker; pt has puncture wound to right eyebrow and bruising and swelling to right eye; pt's sclera is red

## 2018-11-28 NOTE — Discharge Instructions (Addendum)
Absorbable sutures were placed, she does not require suture removal

## 2018-11-28 NOTE — ED Provider Notes (Signed)
[facility] EMERGENCY DEPARTMENT Provider Note   CSN: [id number] Arrival date & time: 11/28/18  0515     History   Chief Complaint Chief Complaint  Patient presents with  . Fall    HPI Karen Richardson is a 84 y.o. female.  Patient presents to the emergency department for evaluation after a fall.  Patient was trying to get to the bathroom and tripped over her walker, falling to the ground.  She thinks she might of hit part of the walker on the way down, suffered a laceration on the right side of her forehead.  Just to the side of her right eye.  Patient is on blood thinners.  She denies neck pain, back pain, extremity pain, hip pain.     Past Medical History:  Diagnosis Date  . Acute on chronic diastolic (congestive) heart failure (HCC)   . Acute on chronic diastolic CHF (congestive heart failure) (HCC) 05/17/2018  . Acute on chronic diastolic heart failure (HCC) 06/06/2018  . Anxiety   . Atrial fibrillation with RVR (HCC) 12/01/2016  . Chronic atrial fibrillation    CHA2DS2VASC score 5  Amiodarone stopped due to neuropathy and skin discoloration 2/19 Tikosyn QT prolongation Decision made to leave in a fib and possibly consider AVN ablation  . CKD (chronic kidney disease), stage III (HCC) 12/12/2016  . Depression   . Dysphagia   . GERD (gastroesophageal reflux disease) 12/01/2016  . Grand mal seizure [facility])    "none since ~ 2005; on daily RX" (06/08/2018)  . Heart murmur   . History of esophageal stricture 12/10/2016  . HLD (hyperlipidemia) 06/09/2018  . Hyperlipidemia   . Hypertension    x 4 months  . Migraine    "bad one til I reached age [age]; none since" (06/08/2018)  . Monitoring for long-term anticoagulant use 12/18/2016  . Other secondary pulmonary hypertension (HCC) 07/02/2017  . Persistent atrial fibrillation 05/19/2018  . PONV (postoperative nausea and vomiting)   . Presbyesophagus 12/11/2016  . Presence of permanent cardiac pacemaker   . Pulmonary emboli (HCC) 12/01/2016    . Pulmonary emphysema (HCC) 09/06/2017  . Pulmonary nodule   . S/P placement of cardiac pacemaker 06/09/2018  . Seizure disorder (HCC) 12/12/2016  . Stroke [facility]) ~ 2018   "in my left eye"     Patient Active Problem List   Diagnosis Date Noted  . HLD (hyperlipidemia) 06/09/2018  . S/P placement of cardiac pacemaker 06/09/2018  . Acute on chronic diastolic heart failure (HCC) 06/06/2018  . Persistent atrial fibrillation 05/19/2018  . Acute on chronic diastolic CHF (congestive heart failure) (HCC) 05/17/2018  . Acute on chronic diastolic (congestive) heart failure (HCC)   . Other social stressor 12/17/2017  . Pulmonary emphysema (HCC) 09/06/2017  . Restrictive lung disease 07/02/2017  . Other secondary pulmonary hypertension (HCC) 07/02/2017  . Monitoring for long-term anticoagulant use 12/18/2016  . CKD (chronic kidney disease), stage III (HCC) 12/12/2016  . Seizure disorder (HCC) 12/12/2016  . Presbyesophagus 12/11/2016  . History of esophageal stricture 12/10/2016  . Pulmonary emboli (HCC) 12/01/2016  . GERD (gastroesophageal reflux disease) 12/01/2016  . Chronic atrial fibrillation   . Hypertension 09/22/2011    Past Surgical History:  Procedure Laterality Date  . ABDOMINAL HYSTERECTOMY     "partial"  . AUGMENTATION MAMMAPLASTY Bilateral   . AV NODE ABLATION N/A 06/03/2018   Procedure: AV NODE ABLATION;  Surgeon: Duke Salvia, MD;  Location: [facility] INVASIVE CV LAB;  Service: Cardiovascular;  Laterality: N/A;  .  CARDIOVERSION N/A 05/03/2017   Procedure: CARDIOVERSION;  Surgeon: Nahser, Deloris Ping, MD;  Location: [facility] ENDOSCOPY;  Service: Cardiovascular;  Laterality: N/A;  . CATARACT EXTRACTION W/ INTRAOCULAR LENS IMPLANT Right 2019  . ESOPHAGEAL DILATION N/A 03/17/2017   Procedure: ESOPHAGEAL DILATION;  Surgeon: Malissa Hippo, MD;  Location: [facility] ENDO SUITE;  Service: Endoscopy;  Laterality: N/A;  . ESOPHAGOGASTRODUODENOSCOPY N/A 03/17/2017   Procedure: ESOPHAGOGASTRODUODENOSCOPY  (EGD);  Surgeon: Malissa Hippo, MD;  Location: [facility] ENDO SUITE;  Service: Endoscopy;  Laterality: N/A;  10:30  . PACEMAKER IMPLANT N/A 05/09/2018   Procedure: PACEMAKER IMPLANT;  Surgeon: Duke Salvia, MD;  Location: [facility] INVASIVE CV LAB;  Service: Cardiovascular;  Laterality: N/A;  . RIGHT HEART CATH N/A 08/25/2018   Procedure: RIGHT HEART CATH;  Surgeon: Dolores Patty, MD;  Location: [facility] INVASIVE CV LAB;  Service: Cardiovascular;  Laterality: N/A;  . TEE WITHOUT CARDIOVERSION N/A 05/06/2018   Procedure: TRANSESOPHAGEAL ECHOCARDIOGRAM (TEE);  Surgeon: Pricilla Riffle, MD;  Location: [facility] ENDOSCOPY;  Service: Cardiovascular;  Laterality: N/A;  . TUMOR EXCISION     from spinal cord ; benign     OB History    Gravida  4   Para  4   Term  4   Preterm      AB      Living  2     SAB      TAB      Ectopic      Multiple      Live Births               Home Medications    Prior to Admission medications   Medication Sig Start Date End Date Taking? Authorizing Provider  atorvastatin (LIPITOR) 10 MG tablet TAKE ONE TABLET BY MOUTH DAILY (BEDTIME) Patient taking differently: Take 10 mg by mouth at bedtime.  07/19/18   Laqueta Linden, MD  carbamazepine (TEGRETOL XR) 100 MG 12 hr tablet TAKE ONE TABLET BY MOUTH BID(MORNING ,BEDTIME) 08/23/18   Aliene Beams, MD  carvedilol (COREG) 6.25 MG tablet Take 1 tablet (6.25 mg total) by mouth 2 (two) times daily with a meal. 06/09/18   Georgie Chard D, NP  Famotidine (PEPCID AC PO) Take by mouth.    [provider]  latanoprost (XALATAN) 0.005 % ophthalmic solution Place 1 drop into both eyes at bedtime. 08/11/18   [provider]  mupirocin ointment (BACTROBAN) 2 % Place 1 application into the nose 4 (four) times daily.    [provider]  pantoprazole (PROTONIX) 40 MG tablet Take 40 mg by mouth daily.    [provider]  potassium chloride (K-DUR,KLOR-CON) 10 MEQ tablet Take 2 tablets (20 mEq  total) by mouth 2 (two) times daily. 07/12/18 09/26/18  Laqueta Linden, MD  torsemide (DEMADEX) 20 MG tablet Take 40 mg by mouth daily. May take additional 20mg  by 2:00 pm if needed    [provider]  torsemide (DEMADEX) 20 MG tablet TAKE TWO (2) TABLETS BY MOUTH IN THE MORNING. TAKE ADDITIONAL TABLET AT 2:00 PM 09/19/18   Laqueta Linden, MD  Vitamin D, Ergocalciferol, (DRISDOL) 50000 units CAPS capsule TAKE ONE CAPSULE BY MOUTH EVERY 7 DAYS (FRIDAY MORNING) Patient taking differently: Take 50,000 Units by mouth every Friday.  06/29/18   Aliene Beams, MD  warfarin (COUMADIN) 2.5 MG tablet TAKE AS DIRECTED BY THE ANTICOAGULATION CLINIC 08/31/18   Laqueta Linden, MD    Family History Family History  Problem Relation  Age of Onset  . Pulmonary embolism Mother   . Colon cancer Mother        mass in colon  . Stroke Father   . Alcohol abuse Father   . CVA Father   . COPD Father   . Diabetes Sister   . Heart failure Sister   . Brain cancer Sister   . Pancreatic cancer Sister   . Epilepsy Sister   . Schizophrenia Brother   . Alcohol abuse Brother   . Lung disease Neg Hx     Social History Social History   Tobacco Use  . Smoking status: Never Smoker  . Smokeless tobacco: Never Used  . Tobacco comment: Father as a child   Substance Use Topics  . Alcohol use: Never    Frequency: Never  . Drug use: Never     Allergies   Codeine; Amiodarone; and Mirtazapine   Review of Systems Review of Systems  Skin: Positive for wound.  All other systems reviewed and are negative.    Physical Exam Updated Vital Signs BP 121/71   Pulse 71   Temp 98.2 F (36.8 C) (Oral)   Resp 18   Ht 5\' 5"  (1.651 m)   Wt 73 kg   SpO2 99%   BMI 26.78 kg/m   Physical Exam Vitals signs and nursing note reviewed.  Constitutional:      General: She is not in acute distress.    Appearance: Normal appearance. She is well-developed.  HENT:     Head: Normocephalic.  Contusion and laceration present.      Right Ear: Hearing normal.     Left Ear: Hearing normal.     Nose: Nose normal.  Eyes:     Conjunctiva/sclera:     Right eye: Hemorrhage present.     Pupils: Pupils are equal, round, and reactive to light.  Neck:     Musculoskeletal: Normal range of motion and neck supple.  Cardiovascular:     Rate and Rhythm: Regular rhythm.     Heart sounds: S1 normal and S2 normal. No murmur. No friction rub. No gallop.   Pulmonary:     Effort: Pulmonary effort is normal. No respiratory distress.     Breath sounds: Normal breath sounds.  Chest:     Chest wall: No tenderness.  Abdominal:     General: Bowel sounds are normal.     Palpations: Abdomen is soft.     Tenderness: There is no abdominal tenderness. There is no guarding or rebound. Negative signs include Murphy's sign and McBurney's sign.     Hernia: No hernia is present.  Musculoskeletal: Normal range of motion.  Skin:    General: Skin is warm and dry.     Findings: No rash.  Neurological:     Mental Status: She is alert and oriented to person, place, and time.     GCS: GCS eye subscore is 4. GCS verbal subscore is 5. GCS motor subscore is 6.     Cranial Nerves: No cranial nerve deficit.     Sensory: No sensory deficit.     Coordination: Coordination normal.  Psychiatric:        Speech: Speech normal.        Behavior: Behavior normal.        Thought Content: Thought content normal.      ED Treatments / Results  Labs (all labs ordered are listed, but only abnormal results are displayed) Labs Reviewed - No data to display  EKG  None  Radiology Ct Head Wo Contrast  Result Date: 11/28/2018 CLINICAL DATA:  Status post head trauma. Fell over walker. Puncture wound at the right eyebrow, and bruising and swelling about the right orbit. Scleral erythema. EXAM: CT HEAD WITHOUT CONTRAST TECHNIQUE: Contiguous axial images were obtained from the base of the skull through the vertex without  intravenous contrast. COMPARISON:  CT of the head performed 09/03/2017 FINDINGS: Brain: No evidence of acute infarction, hemorrhage, hydrocephalus, extra-axial collection or mass lesion / mass effect. Prominence of the ventricles and sulci reflects mild cortical volume loss. Mild cerebellar atrophy is noted. Scattered periventricular white matter change likely reflects small vessel ischemic microangiopathy. The brainstem and fourth ventricle are within normal limits. The basal ganglia are unremarkable in appearance. The cerebral hemispheres demonstrate grossly normal gray-white differentiation. No mass effect or midline shift is seen. Vascular: No hyperdense vessel or unexpected calcification. Skull: There is no evidence of fracture; visualized osseous structures are unremarkable in appearance. Sinuses/Orbits: The visualized portions of the orbits are within normal limits. The paranasal sinuses and mastoid air cells are well-aerated. Other: A soft tissue laceration is noted overlying the right orbit. IMPRESSION: 1. No evidence of traumatic intracranial injury or fracture. 2. Soft tissue laceration overlying the right orbit. 3. Mild cortical volume loss and scattered small vessel ischemic microangiopathy. Electronically Signed   By: Roanna RaiderJeffery  Chang M.D.   On: 11/28/2018 06:17    Procedures .Marland Kitchen.Laceration Repair Date/Time: 11/28/2018 7:03 AM Performed by: Gilda CreasePollina, Shenae Bonanno J, MD Authorized by: Gilda CreasePollina, Claron Rosencrans J, MD   Consent:    Consent obtained:  Verbal   Consent given by:  Healthcare agent Universal protocol:    Procedure explained and questions answered to patient or proxy's satisfaction: yes     Relevant documents present and verified: yes     Test results available and properly labeled: yes     Imaging studies available: yes     Required blood products, implants, devices, and special equipment available: yes     Site/side marked: yes     Immediately prior to procedure, a time out was  called: yes     Patient identity confirmed:  Hospital-assigned identification number Anesthesia (see MAR for exact dosages):    Anesthesia method:  Local infiltration   Local anesthetic:  Lidocaine 2% WITH epi Laceration details:    Location:  Face   Face location:  R eyebrow   Length (cm):  1 Repair type:    Repair type:  Simple Pre-procedure details:    Preparation:  Patient was prepped and draped in usual sterile fashion Exploration:    Hemostasis achieved with:  Epinephrine   Contaminated: no   Treatment:    Area cleansed with:  Betadine   Irrigation solution:  Sterile saline Skin repair:    Repair method:  Sutures   Suture size:  6-0   Suture material:  Fast-absorbing gut   Number of sutures:  2 Approximation:    Approximation:  Close Post-procedure details:    Dressing:  Open (no dressing)   (including critical care time)  Medications Ordered in ED Medications  lidocaine-EPINEPHrine (XYLOCAINE W/EPI) 2 %-1:200000 (PF) injection 10 mL (has no administration in time range)  Tdap (BOOSTRIX) injection 0.5 mL (0.5 mLs Intramuscular Given 11/28/18 0605)     Initial Impression / Assessment and Plan / ED Course  I have reviewed the triage vital signs and the nursing notes.  Pertinent labs & imaging results that were available during my care of the patient were reviewed  by me and considered in my medical decision making (see chart for details).     Patient presents after a fall.  Patient fell while walking, reports tripping over her walker.  Patient suffered a small laceration lateral to the right eye.  This was repaired with sutures.  She does have subconjunctival hemorrhage but no evidence of globe injury, corneal injury.  She has no visual disturbance.  Patient is on blood thinner, underwent CT head.  No abnormality noted.  Remainder of examination unremarkable, does not require any further work-up or imaging.  Final Clinical Impressions(s) / ED Diagnoses   Final  diagnoses:  Injury of head, initial encounter  Facial laceration, initial encounter    ED Discharge Orders    None       Gilda Crease, MD 11/28/18 [phone]

## 2018-12-01 ENCOUNTER — Telehealth: Payer: Self-pay | Admitting: Cardiovascular Disease

## 2018-12-01 ENCOUNTER — Ambulatory Visit (INDEPENDENT_AMBULATORY_CARE_PROVIDER_SITE_OTHER): Admitting: Pharmacist

## 2018-12-01 DIAGNOSIS — Z5181 Encounter for therapeutic drug level monitoring: Secondary | ICD-10-CM | POA: Diagnosis not present

## 2018-12-01 DIAGNOSIS — Z7901 Long term (current) use of anticoagulants: Secondary | ICD-10-CM | POA: Diagnosis not present

## 2018-12-01 DIAGNOSIS — I482 Chronic atrial fibrillation, unspecified: Secondary | ICD-10-CM

## 2018-12-01 LAB — POCT INR: INR: 4 — AB (ref 2.0–3.0)

## 2018-12-01 NOTE — Telephone Encounter (Signed)
INR 4.0

## 2018-12-01 NOTE — Telephone Encounter (Signed)
See anticoag encounter from 12/01/18 for details.

## 2018-12-05 ENCOUNTER — Other Ambulatory Visit: Payer: Self-pay | Admitting: Internal Medicine

## 2018-12-08 ENCOUNTER — Telehealth: Payer: Self-pay | Admitting: *Deleted

## 2018-12-08 ENCOUNTER — Ambulatory Visit (INDEPENDENT_AMBULATORY_CARE_PROVIDER_SITE_OTHER): Admitting: Pharmacist

## 2018-12-08 DIAGNOSIS — I482 Chronic atrial fibrillation, unspecified: Secondary | ICD-10-CM

## 2018-12-08 DIAGNOSIS — Z5181 Encounter for therapeutic drug level monitoring: Secondary | ICD-10-CM | POA: Diagnosis not present

## 2018-12-08 DIAGNOSIS — Z7901 Long term (current) use of anticoagulants: Secondary | ICD-10-CM

## 2018-12-08 LAB — POCT INR: INR: 2.2 (ref 2.0–3.0)

## 2018-12-08 NOTE — Telephone Encounter (Signed)
See anticoag encounter from 12/08/18 for details

## 2018-12-08 NOTE — Telephone Encounter (Signed)
Called number left and only get dial tone. Tried number from last week's INR check and had to LM on VM for cathy.

## 2018-12-08 NOTE — Telephone Encounter (Signed)
Cathy with Hospice called requesting to speak with coumdin Nurse.  Please call [phone]

## 2018-12-12 IMAGING — NM NM PULMONARY VENT & PERF
16 series · 16 of 16 positions shown · non-contrast
Comparison: None

Correlation: Chest radiograph 08/30/2018

CLINICAL DATA: Pulmonary hypertension question chronic pulmonary
embolism, some shortness of breath

EXAM:
NUCLEAR MEDICINE VENTILATION - PERFUSION LUNG SCAN
TECHNIQUE: Ventilation images were obtained in multiple projections using
inhaled aerosol Gc-AAm DTPA. Perfusion images were obtained in
multiple projections after intravenous injection of Gc-OOm-BUU.
RADIOPHARMACEUTICALS:  31 mCi of Gc-AAm DTPA aerosol inhalation and
4.1 mCi ScRRm-4PP IV

[Series 1: ant/post vent · 4.14mm/px · 1 of 1 slices shown (1 of 2)]
[im 1/1]
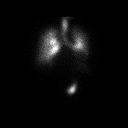

[Series 1: ant/post vent · 4.14mm/px · 1 of 1 slices shown (2 of 2)]
[im 1/1]
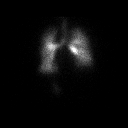

[Series 2: lao/rpo vent · 4.14mm/px · 1 of 1 slices shown (1 of 2)]
[im 1/1]
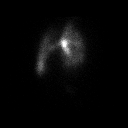

[Series 2: lao/rpo vent · 4.14mm/px · 1 of 1 slices shown (2 of 2)]
[im 1/1]
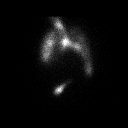

[Series 3: lt lat/rt lat vent · 4.14mm/px · 1 of 1 slices shown (1 of 2)]
[im 1/1]
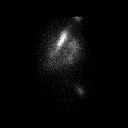

[Series 3: lt lat/rt lat vent · 4.14mm/px · 1 of 1 slices shown (2 of 2)]
[im 1/1]
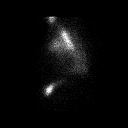

[Series 4: lpo/rao vent · 4.14mm/px · 1 of 1 slices shown (1 of 2)]
[im 1/1]
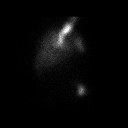

[Series 4: lpo/rao vent · 4.14mm/px · 1 of 1 slices shown (2 of 2)]
[im 1/1]
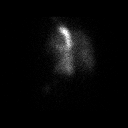

[Series 5: ant/post perf · 4.14mm/px · 1 of 1 slices shown (1 of 2)]
[im 1/1]
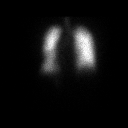

[Series 5: ant/post perf · 4.14mm/px · 1 of 1 slices shown (2 of 2)]
[im 1/1]
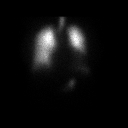

[Series 6: lao/rpo perf · 4.14mm/px · 1 of 1 slices shown (1 of 2)]
[im 1/1]
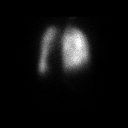

[Series 6: lao/rpo perf · 4.14mm/px · 1 of 1 slices shown (2 of 2)]
[im 1/1]
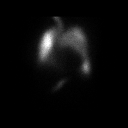

[Series 7: lt lat/rt lat perf · 4.14mm/px · 1 of 1 slices shown (1 of 2)]
[im 1/1]
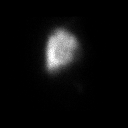

[Series 7: lt lat/rt lat perf · 4.14mm/px · 1 of 1 slices shown (2 of 2)]
[im 1/1]
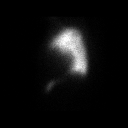

[Series 8: lpo/rao perf · 4.14mm/px · 1 of 1 slices shown (1 of 2)]
[im 1/1]
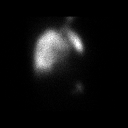

[Series 8: lpo/rao perf · 4.14mm/px · 1 of 1 slices shown (2 of 2)]
[im 1/1]
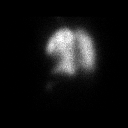

[16 of 16 positions shown; findings below may reference images not displayed]

FINDINGS: Ventilation: Enlargement of cardiac silhouette. Central airway
deposition of aerosol. No focal ventilatory defects.

Perfusion: Enlargement of cardiac silhouette. Perfusion pattern
normal. No segmental or subsegmental perfusion defects.

Chest radiograph: Enlargement of cardiac silhouette with LEFT
subclavian transvenous pacemaker, lead projecting at RIGHT
ventricle.
IMPRESSION: Enlargement of cardiac silhouette.

No focal perfusion or ventilatory abnormalities otherwise
identified.

## 2018-12-19 ENCOUNTER — Other Ambulatory Visit: Payer: Self-pay | Admitting: Cardiovascular Disease

## 2018-12-22 ENCOUNTER — Ambulatory Visit (INDEPENDENT_AMBULATORY_CARE_PROVIDER_SITE_OTHER): Admitting: Pharmacist

## 2018-12-22 ENCOUNTER — Telehealth: Payer: Self-pay | Admitting: *Deleted

## 2018-12-22 DIAGNOSIS — I482 Chronic atrial fibrillation, unspecified: Secondary | ICD-10-CM | POA: Diagnosis not present

## 2018-12-22 DIAGNOSIS — Z5181 Encounter for therapeutic drug level monitoring: Secondary | ICD-10-CM

## 2018-12-22 DIAGNOSIS — Z7901 Long term (current) use of anticoagulants: Secondary | ICD-10-CM | POA: Diagnosis not present

## 2018-12-22 LAB — POCT INR: INR: 4.4 — AB (ref 2.0–3.0)

## 2018-12-22 NOTE — Telephone Encounter (Signed)
See anticoag encounter from 12/22/18 for details.

## 2018-12-22 NOTE — Telephone Encounter (Signed)
Cathey with Hospice called in pt INR 4.4 will forward to covering pharmacist - [phone]

## 2018-12-23 ENCOUNTER — Ambulatory Visit: Payer: Medicare HMO | Admitting: Cardiology

## 2018-12-24 LAB — CUP PACEART REMOTE DEVICE CHECK
Battery Remaining Longevity: 119 mo
Battery Remaining Percentage: 95.5 %
Battery Voltage: 3.02 V
Lead Channel Impedance Value: 560 Ohm
Lead Channel Pacing Threshold Amplitude: 0.5 V
Lead Channel Pacing Threshold Pulse Width: 0.5 ms
Lead Channel Setting Pacing Pulse Width: 0.5 ms
MDC IDC LEAD IMPLANT DT: 20190610
MDC IDC LEAD LOCATION: 753860
MDC IDC MSMT LEADCHNL RV SENSING INTR AMPL: 12 mV
MDC IDC PG IMPLANT DT: 20190610
MDC IDC SESS DTM: 20191205073530
MDC IDC SET LEADCHNL RV PACING AMPLITUDE: 2 V
MDC IDC SET LEADCHNL RV SENSING SENSITIVITY: 4 mV
MDC IDC STAT BRADY RV PERCENT PACED: 97 %
Pulse Gen Model: 1272
Pulse Gen Serial Number: 9007184

## 2018-12-29 ENCOUNTER — Ambulatory Visit (INDEPENDENT_AMBULATORY_CARE_PROVIDER_SITE_OTHER): Admitting: Pharmacist

## 2018-12-29 ENCOUNTER — Telehealth: Payer: Self-pay | Admitting: *Deleted

## 2018-12-29 DIAGNOSIS — Z7901 Long term (current) use of anticoagulants: Secondary | ICD-10-CM | POA: Diagnosis not present

## 2018-12-29 DIAGNOSIS — Z5181 Encounter for therapeutic drug level monitoring: Secondary | ICD-10-CM

## 2018-12-29 DIAGNOSIS — I482 Chronic atrial fibrillation, unspecified: Secondary | ICD-10-CM

## 2018-12-29 LAB — POCT INR: INR: 3.2 — AB (ref 2.0–3.0)

## 2018-12-29 NOTE — Telephone Encounter (Signed)
Current coumadin dose:5 mg on Mondays and 3.75 all other days No changes to meds No hospitalizations, bleeding or brusing Diet: Ate large salad last night

## 2018-12-29 NOTE — Telephone Encounter (Signed)
See anti coag note for details

## 2019-01-05 ENCOUNTER — Telehealth: Payer: Self-pay | Admitting: Pharmacist

## 2019-01-05 ENCOUNTER — Ambulatory Visit (INDEPENDENT_AMBULATORY_CARE_PROVIDER_SITE_OTHER): Admitting: Pharmacist

## 2019-01-05 DIAGNOSIS — Z7901 Long term (current) use of anticoagulants: Secondary | ICD-10-CM

## 2019-01-05 DIAGNOSIS — Z5181 Encounter for therapeutic drug level monitoring: Secondary | ICD-10-CM

## 2019-01-05 DIAGNOSIS — I482 Chronic atrial fibrillation, unspecified: Secondary | ICD-10-CM

## 2019-01-05 LAB — POCT INR: INR: 3.1 — AB (ref 2.0–3.0)

## 2019-01-05 NOTE — Telephone Encounter (Signed)
Karen Richardson with [facility] called.   INR  3.1  Please call [phone].

## 2019-01-05 NOTE — Telephone Encounter (Signed)
See phone note from 01/05/19 for details.

## 2019-01-12 ENCOUNTER — Ambulatory Visit (INDEPENDENT_AMBULATORY_CARE_PROVIDER_SITE_OTHER): Admitting: Pharmacist

## 2019-01-12 DIAGNOSIS — Z7901 Long term (current) use of anticoagulants: Secondary | ICD-10-CM | POA: Diagnosis not present

## 2019-01-12 DIAGNOSIS — Z5181 Encounter for therapeutic drug level monitoring: Secondary | ICD-10-CM

## 2019-01-12 DIAGNOSIS — I482 Chronic atrial fibrillation, unspecified: Secondary | ICD-10-CM

## 2019-01-12 LAB — POCT INR: INR: 2.9 (ref 2.0–3.0)

## 2019-01-16 ENCOUNTER — Telehealth: Payer: Self-pay | Admitting: Pharmacist

## 2019-01-16 NOTE — Telephone Encounter (Signed)
Spoke with pt's daughter. Who states that sister was at home on Thursday and she never heard what dose to have pt take. Advised that spoke with [facility] and advised of dose. They have given her 2.5mg  on Thursday and 3.75mg  all other days. Advised this was the correct dose and continue until recheck on Thursday.   She also states that her mother has been having horrible itching since starting carvedilol several months ago. She states that she has been on several different medications that help slightly, but pt still itching to no avail. They believe this is coming from the carvedilol. They wonder if a lower dosage or discontinuation is possible. Advised that would need to wean dosage.   Will route to Dr. Kirtland Bouchard for input on decreased dosage or discontinuation.

## 2019-01-16 NOTE — Telephone Encounter (Signed)
Patient's daughter called stating that she needs to speak with Coumdin Nurse. Will forward to [place].  Also, she is concerned about an itch that patient continues to have. Daughter states that she feels the itching if coming from one of her heart medications.   Please call [phone].

## 2019-01-19 ENCOUNTER — Telehealth: Payer: Self-pay | Admitting: *Deleted

## 2019-01-19 NOTE — Telephone Encounter (Signed)
-----   Message from Albertine Patricia, CMA sent at 01/19/2019 10:59 AM EST ----- Olegario Messier from hospice called INR 4.2 on pt today - Kathy's # [phone]  [facility]

## 2019-01-19 NOTE — Telephone Encounter (Signed)
4:48pm  Attempted Kathy's cell phone # several times.  Kept receiving a fast busy signal.

## 2019-01-23 ENCOUNTER — Ambulatory Visit: Payer: Self-pay | Admitting: *Deleted

## 2019-01-23 ENCOUNTER — Telehealth: Payer: Self-pay | Admitting: Cardiovascular Disease

## 2019-01-23 DIAGNOSIS — Z7901 Long term (current) use of anticoagulants: Principal | ICD-10-CM

## 2019-01-23 DIAGNOSIS — Z5181 Encounter for therapeutic drug level monitoring: Secondary | ICD-10-CM

## 2019-01-23 DIAGNOSIS — I482 Chronic atrial fibrillation, unspecified: Secondary | ICD-10-CM

## 2019-01-23 NOTE — Telephone Encounter (Signed)
Please give pt's daughter Sedalia Muta a call concerning her medications [phone]

## 2019-01-23 NOTE — Telephone Encounter (Signed)
Returned call, no answer, no voicemail.

## 2019-01-23 NOTE — Telephone Encounter (Signed)
Spoke with Ardelle Balls RN Hospice about INR of 4.2 reported on 2/20.  She told pt to hold coumadin that night.  Dr Cleone Slim has decided to have pt take coumadin 2mg  daily and leave dose at that due to fluctuations in INR.  He will be managing coumadin since she is under hospice care.

## 2019-01-23 NOTE — Telephone Encounter (Signed)
Hey, what is that pool number again.  I can never get it to work and have to put in names individually.  Thanks for everything.

## 2019-01-25 NOTE — Telephone Encounter (Signed)
Could try switching to Toprol XL 25 mg daily.

## 2019-01-25 NOTE — Telephone Encounter (Signed)
Spoke with [name], who states that hospice has change carvedilol to metoprolol tartrate. This has been updated on medication list.   Will route to Dr. Kirtland Bouchard as [place]ain Childes.

## 2019-01-25 NOTE — Addendum Note (Signed)
Addended by: Levin Bacon on: 01/25/2019 04:08 PM   Modules accepted: Orders

## 2019-01-27 DIAGNOSIS — Z743 Need for continuous supervision: Secondary | ICD-10-CM | POA: Diagnosis not present

## 2019-01-27 DIAGNOSIS — I959 Hypotension, unspecified: Secondary | ICD-10-CM | POA: Diagnosis not present

## 2019-01-27 DIAGNOSIS — R279 Unspecified lack of coordination: Secondary | ICD-10-CM | POA: Diagnosis not present

## 2019-01-30 ENCOUNTER — Telehealth: Payer: Self-pay

## 2019-01-30 ENCOUNTER — Encounter (HOSPITAL_COMMUNITY): Payer: Medicare HMO | Admitting: Internal Medicine

## 2019-01-30 NOTE — Telephone Encounter (Signed)
New Message  PTs daughter is calling because The PT is in Hospice and she said she wont be able to send the home remote transmission Shes not sure when or if she will get to go home.  Please call if you have questions.

## 2019-01-31 NOTE — Telephone Encounter (Signed)
Spoke w/ pt daughter and she stated that it will not be long before patient expires. She wanted to cancel upcoming remote appt and how to she took care of the monitor. Remote app cancelled. Informed her that [facility] doesn't take their home monitors back so she can discard it however she likes. Pt daughter verbalized understanding.

## 2019-02-02 ENCOUNTER — Encounter

## 2019-03-01 DEATH — deceased

## 2020-03-05 IMAGING — US US THYROID
1 series · 13 of 25 positions shown · non-contrast
Comparison: None.

CLINICAL DATA: Other.  Abnormal TSH.

EXAM:
THYROID ULTRASOUND
TECHNIQUE: Ultrasound examination of the thyroid gland and adjacent soft
tissues was performed.

[Series 1: us thyroid · 0.06mm/px · 13 of 57 slices shown]
[im 1/57]
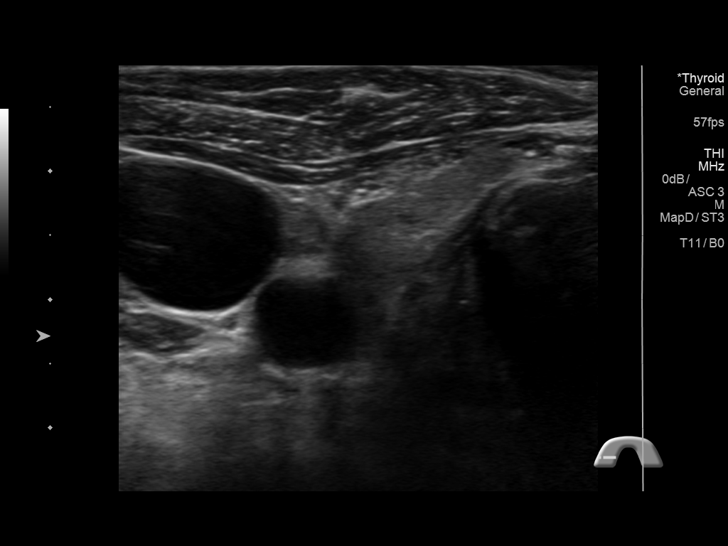
[im 5/57]
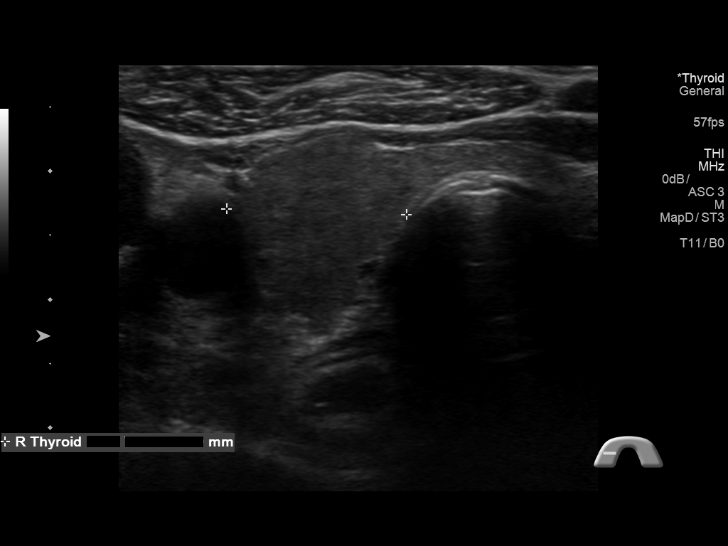
[im 10/57]
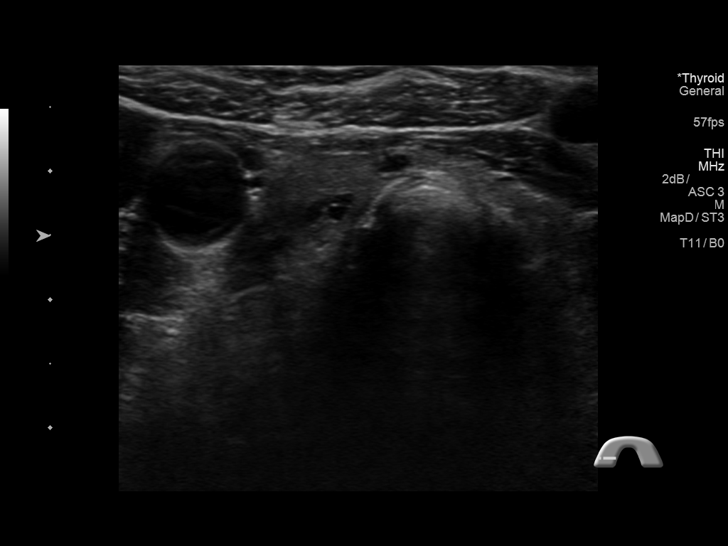
[im 15/57]
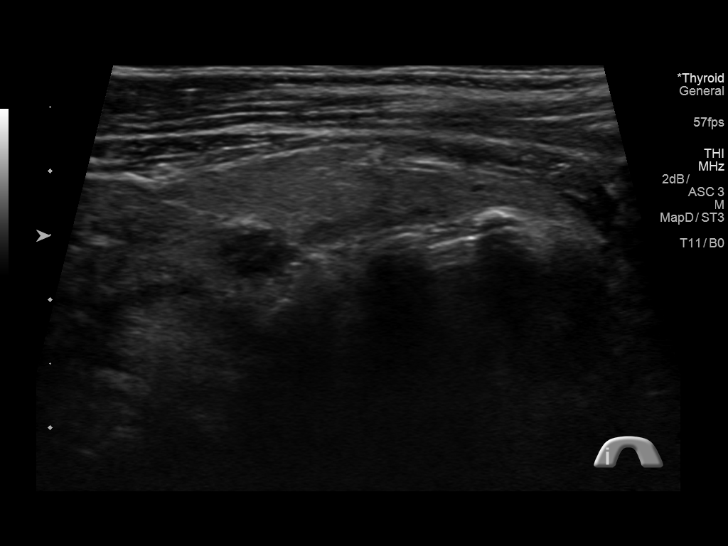
[im 19/57]
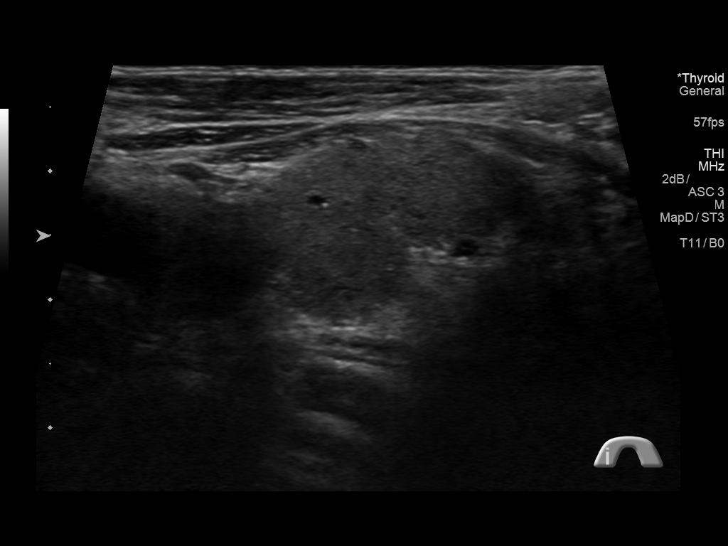
[im 24/57]
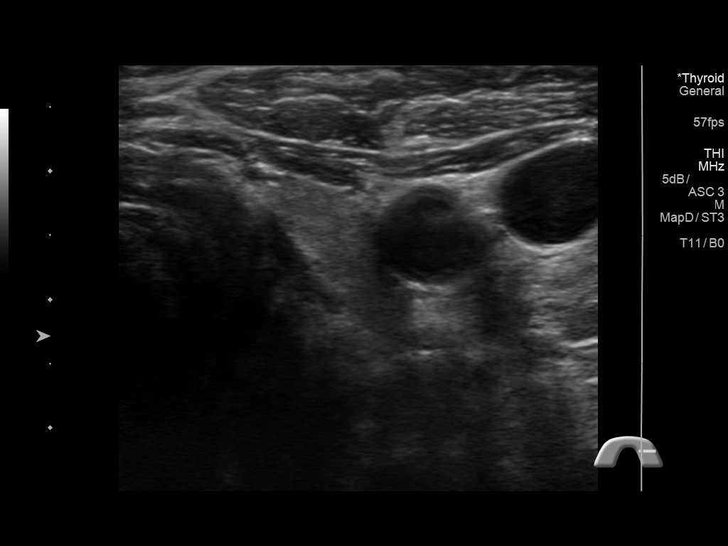
[im 29/57]
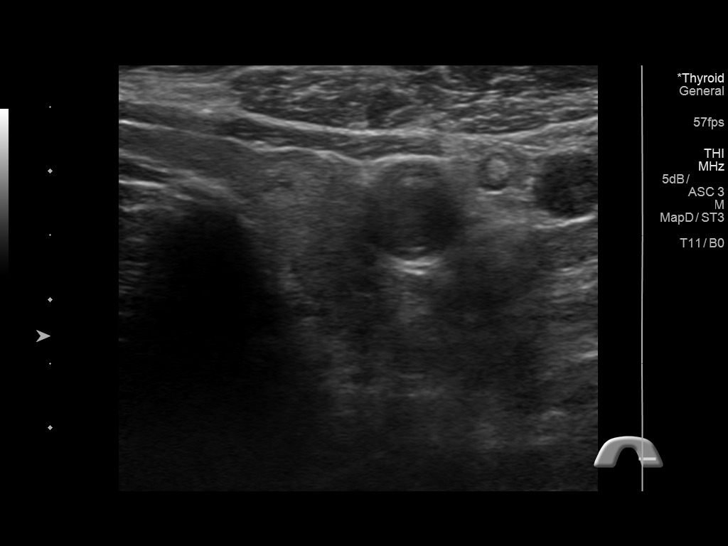
[im 33/57]
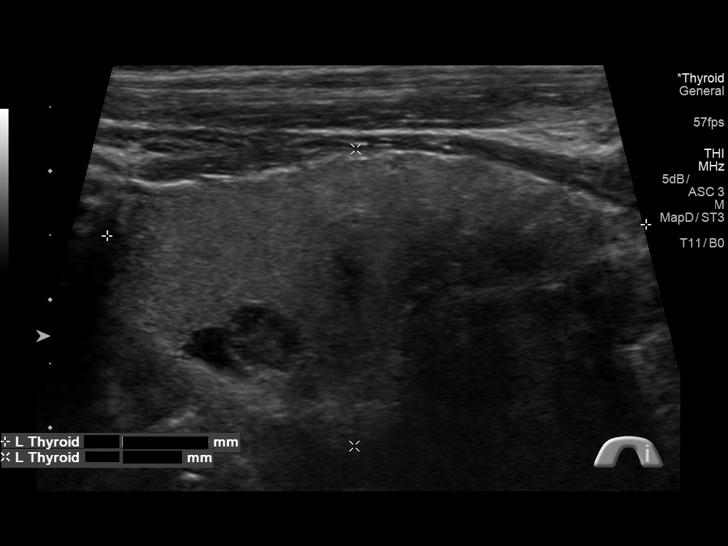
[im 38/57]
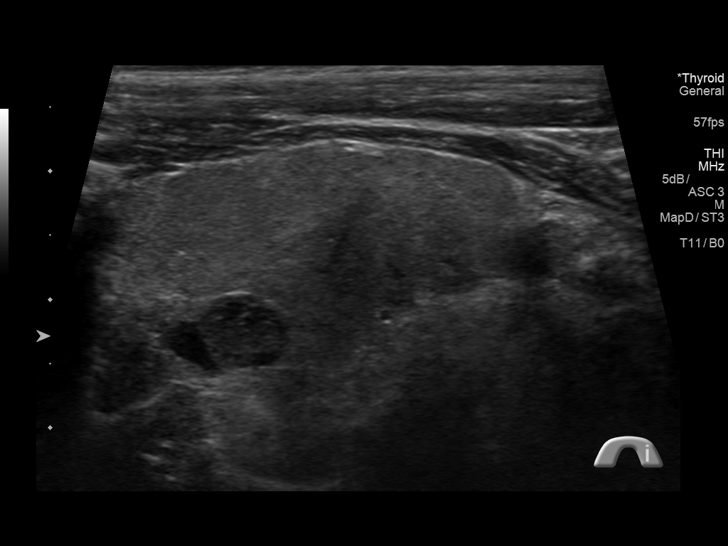
[im 43/57]
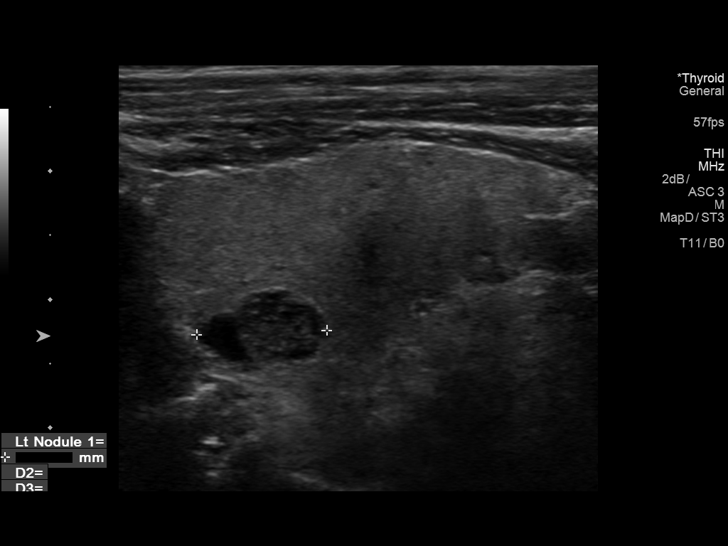
[im 47/57]
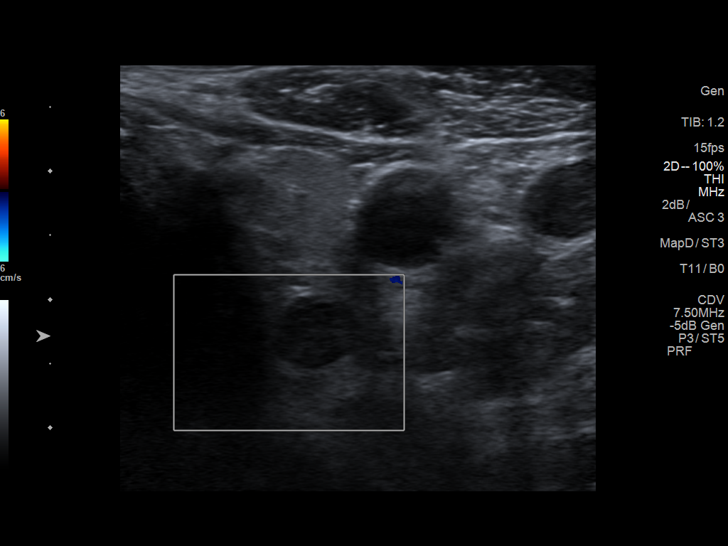
[im 52/57]
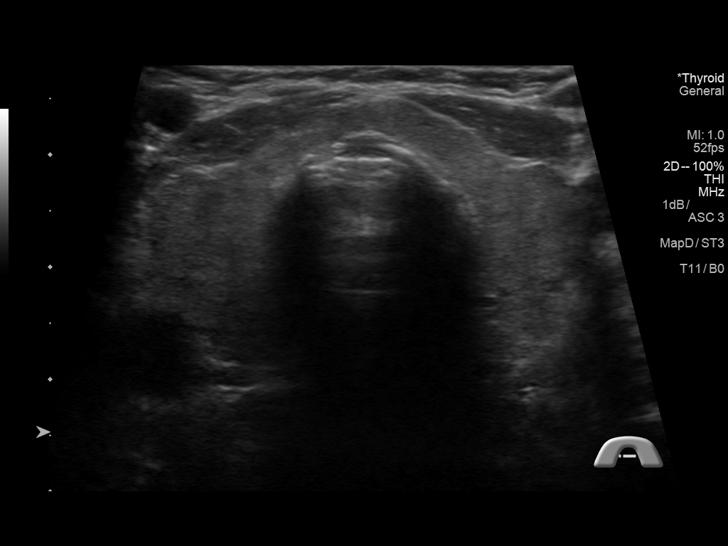
[im 57/57]
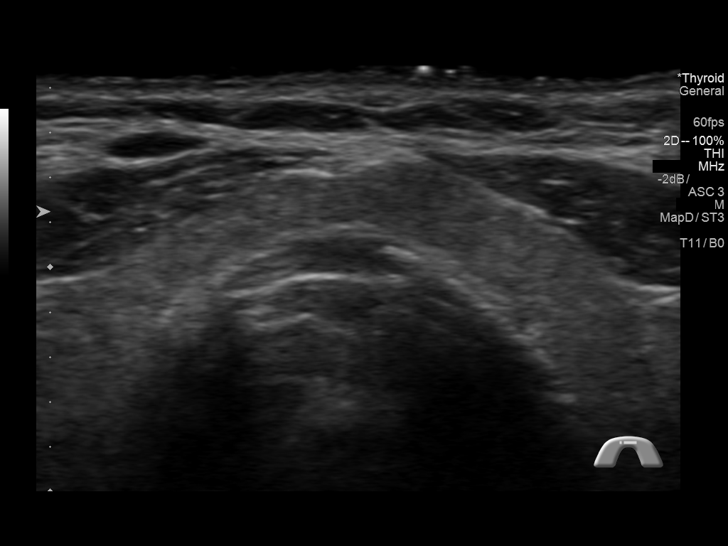

[13 of 25 positions shown; findings below may reference images not displayed]

FINDINGS: Parenchymal Echotexture: Mildly heterogenous

Isthmus: Normal in size measures 0.3 cm in diameter

Right lobe: Normal in size measuring 4.2 x 1.4 x 1.4 cm

Left lobe: Normal in size measuring 4.2 x 2.3 x 1.1 cm

_________________________________________________________

Estimated total number of nodules >/= 1 cm: 1

Number of spongiform nodules >/=  2 cm not described below (TR1): 0

Number of mixed cystic and solid nodules >/= 1.5 cm not described
below (TR2): 0

_________________________________________________________

Nodule # 1:

Location: Left; Mid

Maximum size: 0.7 cm; Other 2 dimensions: 0.7 x 0.6 cm

Composition: solid/almost completely solid (2)

Echogenicity: hypoechoic (2)

Shape: not taller-than-wide (0)

Margins: smooth (0)

Echogenic foci: none (0)

ACR TI-RADS total points: 4.

ACR TI-RADS risk category: TR4 (4-6 points).

ACR TI-RADS recommendations:

Given size (<0.9 cm) and appearance, this nodule does NOT meet
TI-RADS criteria for biopsy or dedicated follow-up.

_________________________________________________________

Note is made of an adjacent approximately 0.5 x 0.3 cm anechoic cyst
within the posterior, mid aspect of the left lobe of the thyroid
which does not meet imaging criteria to recommend percutaneous
sampling or continued dedicated follow-up.
IMPRESSION: No worrisome thyroid nodules. Percutaneous sampling and/or continued
dedicated follow-up is not recommended.

The above is in keeping with the ACR TI-RADS recommendations - [HOSPITAL] 5125;[DATE].
